# Patient Record
Sex: Female | Born: 1975
Health system: Southern US, Community
[De-identification: ages and names within clinical notes are randomized; demographics above are authoritative.]

## PROBLEM LIST (undated history)

## (undated) DIAGNOSIS — Z720 Tobacco use: Secondary | ICD-10-CM

## (undated) DIAGNOSIS — M549 Dorsalgia, unspecified: Secondary | ICD-10-CM

## (undated) DIAGNOSIS — Q790 Congenital diaphragmatic hernia: Secondary | ICD-10-CM

## (undated) DIAGNOSIS — R001 Bradycardia, unspecified: Secondary | ICD-10-CM

## (undated) DIAGNOSIS — I519 Heart disease, unspecified: Secondary | ICD-10-CM

## (undated) DIAGNOSIS — K449 Diaphragmatic hernia without obstruction or gangrene: Secondary | ICD-10-CM

## (undated) DIAGNOSIS — K760 Fatty (change of) liver, not elsewhere classified: Secondary | ICD-10-CM

## (undated) DIAGNOSIS — F329 Major depressive disorder, single episode, unspecified: Secondary | ICD-10-CM

## (undated) DIAGNOSIS — E669 Obesity, unspecified: Secondary | ICD-10-CM

## (undated) DIAGNOSIS — F32A Depression, unspecified: Secondary | ICD-10-CM

## (undated) DIAGNOSIS — A419 Sepsis, unspecified organism: Secondary | ICD-10-CM

## (undated) DIAGNOSIS — I2510 Atherosclerotic heart disease of native coronary artery without angina pectoris: Secondary | ICD-10-CM

## (undated) DIAGNOSIS — Z9109 Other allergy status, other than to drugs and biological substances: Secondary | ICD-10-CM

## (undated) DIAGNOSIS — F419 Anxiety disorder, unspecified: Secondary | ICD-10-CM

## (undated) DIAGNOSIS — I10 Essential (primary) hypertension: Secondary | ICD-10-CM

## (undated) DIAGNOSIS — I219 Acute myocardial infarction, unspecified: Secondary | ICD-10-CM

## (undated) DIAGNOSIS — K219 Gastro-esophageal reflux disease without esophagitis: Secondary | ICD-10-CM

## (undated) DIAGNOSIS — E785 Hyperlipidemia, unspecified: Secondary | ICD-10-CM

## (undated) HISTORY — DX: Obesity, unspecified: E66.9

## (undated) HISTORY — PX: CARDIAC CATHETERIZATION: SHX172

## (undated) HISTORY — DX: Other allergy status, other than to drugs and biological substances: Z91.09

## (undated) HISTORY — DX: Depression, unspecified: F32.A

## (undated) HISTORY — DX: Diaphragmatic hernia without obstruction or gangrene: K44.9

## (undated) HISTORY — PX: TONSILLECTOMY: SUR1361

## (undated) HISTORY — DX: Major depressive disorder, single episode, unspecified: F32.9

## (undated) HISTORY — DX: Sepsis, unspecified organism: A41.9

## (undated) HISTORY — DX: Fatty (change of) liver, not elsewhere classified: K76.0

## (undated) HISTORY — DX: Dorsalgia, unspecified: M54.9

## (undated) HISTORY — DX: Heart disease, unspecified: I51.9

## (undated) HISTORY — DX: Anxiety disorder, unspecified: F41.9

## (undated) HISTORY — DX: Gastro-esophageal reflux disease without esophagitis: K21.9

---

## 1998-01-27 ENCOUNTER — Emergency Department (HOSPITAL_COMMUNITY): Admission: EM | Admit: 1998-01-27 | Discharge: 1998-01-27 | Payer: Self-pay

## 2001-08-15 ENCOUNTER — Other Ambulatory Visit: Admission: RE | Admit: 2001-08-15 | Discharge: 2001-08-15 | Payer: Self-pay | Admitting: Obstetrics and Gynecology

## 2004-03-18 ENCOUNTER — Other Ambulatory Visit: Admission: RE | Admit: 2004-03-18 | Discharge: 2004-03-18 | Payer: Self-pay | Admitting: Obstetrics and Gynecology

## 2011-03-31 HISTORY — PX: OTHER SURGICAL HISTORY: SHX169

## 2011-07-27 DIAGNOSIS — G8929 Other chronic pain: Secondary | ICD-10-CM | POA: Insufficient documentation

## 2011-07-29 DIAGNOSIS — I252 Old myocardial infarction: Secondary | ICD-10-CM

## 2011-07-29 DIAGNOSIS — I2510 Atherosclerotic heart disease of native coronary artery without angina pectoris: Secondary | ICD-10-CM

## 2011-07-29 HISTORY — DX: Old myocardial infarction: I25.2

## 2011-07-29 HISTORY — DX: Atherosclerotic heart disease of native coronary artery without angina pectoris: I25.10

## 2011-08-09 ENCOUNTER — Emergency Department (HOSPITAL_COMMUNITY): Payer: Self-pay

## 2011-08-09 ENCOUNTER — Encounter (HOSPITAL_COMMUNITY): Payer: Self-pay

## 2011-08-09 ENCOUNTER — Inpatient Hospital Stay (HOSPITAL_COMMUNITY)
Admission: EM | Admit: 2011-08-09 | Discharge: 2011-08-11 | DRG: 246 | Disposition: A | Discharge status: Home / Self Care | Payer: 59 | Source: Ambulatory Visit | Attending: Cardiology | Admitting: Cardiology

## 2011-08-09 ENCOUNTER — Other Ambulatory Visit: Payer: Self-pay

## 2011-08-09 DIAGNOSIS — I219 Acute myocardial infarction, unspecified: Secondary | ICD-10-CM

## 2011-08-09 DIAGNOSIS — Q791 Other congenital malformations of diaphragm: Secondary | ICD-10-CM

## 2011-08-09 DIAGNOSIS — I498 Other specified cardiac arrhythmias: Secondary | ICD-10-CM | POA: Diagnosis present

## 2011-08-09 DIAGNOSIS — N390 Urinary tract infection, site not specified: Secondary | ICD-10-CM | POA: Diagnosis present

## 2011-08-09 DIAGNOSIS — I214 Non-ST elevation (NSTEMI) myocardial infarction: Principal | ICD-10-CM | POA: Diagnosis present

## 2011-08-09 DIAGNOSIS — I2510 Atherosclerotic heart disease of native coronary artery without angina pectoris: Secondary | ICD-10-CM | POA: Diagnosis present

## 2011-08-09 DIAGNOSIS — R0609 Other forms of dyspnea: Secondary | ICD-10-CM

## 2011-08-09 DIAGNOSIS — F17200 Nicotine dependence, unspecified, uncomplicated: Secondary | ICD-10-CM | POA: Diagnosis present

## 2011-08-09 DIAGNOSIS — Z720 Tobacco use: Secondary | ICD-10-CM | POA: Diagnosis present

## 2011-08-09 DIAGNOSIS — R0989 Other specified symptoms and signs involving the circulatory and respiratory systems: Secondary | ICD-10-CM

## 2011-08-09 DIAGNOSIS — Z9089 Acquired absence of other organs: Secondary | ICD-10-CM

## 2011-08-09 DIAGNOSIS — Z955 Presence of coronary angioplasty implant and graft: Secondary | ICD-10-CM

## 2011-08-09 DIAGNOSIS — E785 Hyperlipidemia, unspecified: Secondary | ICD-10-CM | POA: Diagnosis present

## 2011-08-09 HISTORY — DX: Bradycardia, unspecified: R00.1

## 2011-08-09 HISTORY — DX: Congenital diaphragmatic hernia: Q79.0

## 2011-08-09 HISTORY — DX: Tobacco use: Z72.0

## 2011-08-09 HISTORY — DX: Atherosclerotic heart disease of native coronary artery without angina pectoris: I25.10

## 2011-08-09 HISTORY — DX: Hyperlipidemia, unspecified: E78.5

## 2011-08-09 LAB — URINALYSIS, ROUTINE W REFLEX MICROSCOPIC
Bilirubin Urine: NEGATIVE
Glucose, UA: NEGATIVE mg/dL
Hgb urine dipstick: NEGATIVE
Ketones, ur: NEGATIVE mg/dL
Nitrite: POSITIVE — AB
Protein, ur: NEGATIVE mg/dL
Specific Gravity, Urine: 1.014 (ref 1.005–1.030)
Urobilinogen, UA: 0.2 mg/dL (ref 0.0–1.0)
pH: 7.5 (ref 5.0–8.0)

## 2011-08-09 LAB — BASIC METABOLIC PANEL WITH GFR
BUN: 7 mg/dL (ref 6–23)
CO2: 26 meq/L (ref 19–32)
Calcium: 10 mg/dL (ref 8.4–10.5)
Chloride: 100 meq/L (ref 96–112)
Creatinine, Ser: 0.64 mg/dL (ref 0.50–1.10)
GFR calc Af Amer: >90 mL/min
GFR calc non Af Amer: >90 mL/min
Glucose, Bld: 98 mg/dL (ref 70–99)
Potassium: 4.7 meq/L (ref 3.5–5.1)
Sodium: 137 meq/L (ref 135–145)

## 2011-08-09 LAB — COMPREHENSIVE METABOLIC PANEL
ALT: 39 U/L — ABNORMAL HIGH (ref 0–35)
AST: 99 U/L — ABNORMAL HIGH (ref 0–37)
CO2: 23 mEq/L (ref 19–32)
Chloride: 101 mEq/L (ref 96–112)
GFR calc non Af Amer: >90 mL/min (ref >90)
Potassium: 3.6 mEq/L (ref 3.5–5.1)
Sodium: 136 mEq/L (ref 135–145)
Total Bilirubin: 0.5 mg/dL (ref 0.3–1.2)

## 2011-08-09 LAB — DIFFERENTIAL
Basophils Absolute: 0 K/uL (ref 0.0–0.1)
Basophils Relative: 0 % (ref 0–1)
Eosinophils Absolute: 0 K/uL (ref 0.0–0.7)
Eosinophils Relative: 0 % (ref 0–5)
Lymphocytes Relative: 20 % (ref 12–46)
Lymphocytes Relative: 18 % (ref 12–46)
Lymphs Abs: 3.5 K/uL (ref 0.7–4.0)
Lymphs Abs: 3.4 10*3/uL (ref 0.7–4.0)
Monocytes Absolute: 1.2 K/uL — ABNORMAL HIGH (ref 0.1–1.0)
Monocytes Relative: 7 % (ref 3–12)
Neutro Abs: 12.5 K/uL — ABNORMAL HIGH (ref 1.7–7.7)
Neutro Abs: 13.5 10*3/uL — ABNORMAL HIGH (ref 1.7–7.7)
Neutrophils Relative %: 72 % (ref 43–77)
Neutrophils Relative %: 74 % (ref 43–77)

## 2011-08-09 LAB — URINE MICROSCOPIC-ADD ON

## 2011-08-09 LAB — TROPONIN I: Troponin I: 14.4 ng/mL (ref <0.30)

## 2011-08-09 LAB — CARDIAC PANEL(CRET KIN+CKTOT+MB+TROPI)
Relative Index: 9.7 — ABNORMAL HIGH (ref 0.0–2.5)
Total CK: 874 U/L — ABNORMAL HIGH (ref 7–177)

## 2011-08-09 LAB — CBC
HCT: 49.5 % — ABNORMAL HIGH (ref 36.0–46.0)
Hemoglobin: 18.1 g/dL — ABNORMAL HIGH (ref 12.0–15.0)
MCH: 33.8 pg (ref 26.0–34.0)
MCV: 92.3 fL (ref 78.0–100.0)
Platelets: 256 10*3/uL (ref 150–400)
RBC: 5.35 MIL/uL — ABNORMAL HIGH (ref 3.87–5.11)
RBC: 4.91 MIL/uL (ref 3.87–5.11)
WBC: 18.3 10*3/uL — ABNORMAL HIGH (ref 4.0–10.5)

## 2011-08-09 LAB — POCT I-STAT TROPONIN I: Troponin i, poc: 5.83 ng/mL (ref 0.00–0.08)

## 2011-08-09 LAB — POCT PREGNANCY, URINE: Preg Test, Ur: NEGATIVE

## 2011-08-09 LAB — PROTIME-INR: Prothrombin Time: 13.3 seconds (ref 11.6–15.2)

## 2011-08-09 LAB — APTT: aPTT: 75 seconds — ABNORMAL HIGH (ref 24–37)

## 2011-08-09 MED ORDER — SODIUM CHLORIDE 0.9 % IJ SOLN: 3.0000 mL | INTRAMUSCULAR | Status: DC | PRN

## 2011-08-09 MED ORDER — SODIUM CHLORIDE 0.9 % IV SOLN: INTRAVENOUS | Status: DC

## 2011-08-09 MED ORDER — NITROGLYCERIN 2 % TD OINT
TOPICAL_OINTMENT | TRANSDERMAL | Status: AC
  Filled 2011-08-09: qty 1

## 2011-08-09 MED ORDER — HEPARIN (PORCINE) IN NACL 100-0.45 UNIT/ML-% IJ SOLN: 1200.0000 [IU]/h | INTRAMUSCULAR | Status: DC

## 2011-08-09 MED ORDER — ONDANSETRON HCL 4 MG/2ML IJ SOLN
4.0000 mg | Freq: Once | INTRAMUSCULAR | Status: DC
  Filled 2011-08-09: qty 2

## 2011-08-09 MED ORDER — ASPIRIN 300 MG RE SUPP
300.0000 mg | RECTAL | Status: DC
  Filled 2011-08-09: qty 1

## 2011-08-09 MED ORDER — NITROGLYCERIN 0.4 MG SL SUBL: 0.4000 mg | SUBLINGUAL_TABLET | SUBLINGUAL | Status: DC | PRN

## 2011-08-09 MED ORDER — ASPIRIN 81 MG PO CHEW
324.0000 mg | CHEWABLE_TABLET | ORAL | Status: DC
  Filled 2011-08-09: qty 4

## 2011-08-09 MED ORDER — ASPIRIN EC 81 MG PO TBEC
81.0000 mg | DELAYED_RELEASE_TABLET | Freq: Every day | ORAL | Status: DC
  Administered 2011-08-11: 81 mg via ORAL
  Filled 2011-08-09 (×2): qty 1

## 2011-08-09 MED ORDER — SODIUM CHLORIDE 0.9 % IV SOLN: Freq: Once | INTRAVENOUS | Status: DC

## 2011-08-09 MED ORDER — SODIUM CHLORIDE 0.9 % IV SOLN: 250.0000 mL | INTRAVENOUS | Status: DC | PRN

## 2011-08-09 MED ORDER — ONDANSETRON HCL 4 MG/2ML IJ SOLN
4.0000 mg | Freq: Four times a day (QID) | INTRAMUSCULAR | Status: DC | PRN
  Administered 2011-08-10: 4 mg via INTRAVENOUS
  Filled 2011-08-09: qty 2

## 2011-08-09 MED ORDER — ALPRAZOLAM 0.25 MG PO TABS
0.2500 mg | ORAL_TABLET | Freq: Two times a day (BID) | ORAL | Status: DC | PRN
  Administered 2011-08-09 – 2011-08-10 (×3): 0.25 mg via ORAL
  Filled 2011-08-09 (×3): qty 1

## 2011-08-09 MED ORDER — SODIUM CHLORIDE 0.9 % IV SOLN
INTRAVENOUS | Status: DC
  Administered 2011-08-09 – 2011-08-10 (×2): via INTRAVENOUS

## 2011-08-09 MED ORDER — HEPARIN (PORCINE) IN NACL 100-0.45 UNIT/ML-% IJ SOLN
1200.0000 [IU]/h | INTRAMUSCULAR | Status: AC
  Administered 2011-08-09: 1200 [IU]/h via INTRAVENOUS
  Filled 2011-08-09 (×2): qty 250

## 2011-08-09 MED ORDER — ASPIRIN 81 MG PO CHEW
324.0000 mg | CHEWABLE_TABLET | ORAL | Status: AC
  Administered 2011-08-10: 324 mg via ORAL

## 2011-08-09 MED ORDER — HEPARIN BOLUS VIA INFUSION
4000.0000 [IU] | Freq: Once | INTRAVENOUS | Status: AC
  Administered 2011-08-09: 4000 [IU] via INTRAVENOUS

## 2011-08-09 MED ORDER — METOPROLOL TARTRATE 25 MG PO TABS
25.0000 mg | ORAL_TABLET | Freq: Two times a day (BID) | ORAL | Status: DC
  Administered 2011-08-09 – 2011-08-11 (×4): 25 mg via ORAL
  Filled 2011-08-09 (×5): qty 1

## 2011-08-09 MED ORDER — ASPIRIN 81 MG PO CHEW
324.0000 mg | CHEWABLE_TABLET | Freq: Once | ORAL | Status: DC
  Filled 2011-08-09: qty 4

## 2011-08-09 MED ORDER — NITROGLYCERIN 2 % TD OINT
1.0000 [in_us] | TOPICAL_OINTMENT | Freq: Four times a day (QID) | TRANSDERMAL | Status: DC
  Administered 2011-08-09: 19:00:00 via TOPICAL
  Administered 2011-08-09 – 2011-08-11 (×6): 1 [in_us] via TOPICAL
  Filled 2011-08-09: qty 30

## 2011-08-09 MED ORDER — MORPHINE SULFATE 4 MG/ML IJ SOLN
4.0000 mg | Freq: Once | INTRAMUSCULAR | Status: DC
  Filled 2011-08-09: qty 1

## 2011-08-09 MED ORDER — ATORVASTATIN CALCIUM 80 MG PO TABS
80.0000 mg | ORAL_TABLET | Freq: Every day | ORAL | Status: DC
  Administered 2011-08-10: 80 mg via ORAL
  Filled 2011-08-09 (×2): qty 1

## 2011-08-09 MED ORDER — IOHEXOL 350 MG/ML SOLN
100.0000 mL | Freq: Once | INTRAVENOUS | Status: AC | PRN
  Administered 2011-08-09: 100 mL via INTRAVENOUS

## 2011-08-09 MED ORDER — ACETAMINOPHEN 325 MG PO TABS
650.0000 mg | ORAL_TABLET | ORAL | Status: DC | PRN
  Administered 2011-08-09 – 2011-08-10 (×4): 650 mg via ORAL
  Filled 2011-08-09 (×4): qty 2

## 2011-08-09 MED ORDER — SODIUM CHLORIDE 0.9 % IJ SOLN
3.0000 mL | Freq: Two times a day (BID) | INTRAMUSCULAR | Status: DC
  Administered 2011-08-09: 3 mL via INTRAVENOUS

## 2011-08-09 NOTE — Progress Notes (Signed)
ANTICOAGULATION CONSULT NOTE - Initial Consult  Pharmacy Consult for heparin Indication: chest pain/ACS  No Known Allergies  Patient Measurements: Height: 5\' 4"  (162.6 cm) Weight: 230 lb (104.327 kg) IBW/kg (Calculated) : 54.7  Heparin Dosing Weight: 80kg  Vital Signs: Temp: 97 F (36.1 C) (05/12 1258) Temp src: Oral (05/12 1258) BP: 137/81 mmHg (05/12 1845) Pulse Rate: 99  (05/12 1845)  Labs:  Basename 08/09/11 1544 08/09/11 1446  HGB -- 18.1*  HCT -- 49.5*  PLT -- 267  APTT -- --  LABPROT -- --  INR -- --  HEPARINUNFRC -- --  CREATININE -- 0.64  CKTOTAL -- --  CKMB -- --  TROPONINI 14.40* --    Estimated Creatinine Clearance: 112.1 ml/min (by C-G formula based on Cr of 0.64).   Medical History: History reviewed. No pertinent past medical history.  Medications:  Scheduled:    . sodium chloride   Intravenous Once  . nitroGLYCERIN  1 inch Topical Q6H  . DISCONTD: aspirin  324 mg Oral Once  . DISCONTD:  morphine injection  4 mg Intravenous Once  . DISCONTD: ondansetron (ZOFRAN) IV  4 mg Intravenous Once   Infusions:    Assessment: 36 yo female with chest pain / ACS will be started on heparin threarpy.  Baseline CBC ok.  Troponin elevated. Not on prior anticoagulation.  Goal of Therapy:  Heparin level 0.3-0.7 units/ml Monitor platelets by anticoagulation protocol: Yes   Plan:  1) Heparin 4000 units iv bolus x1, then start heparin drip at 1200 units/hr. 2) Check a 6 hour heparin level after drip is started. 3) Daily heparin level and CBC.  Rainey Kahrs, Tsz-Yin 08/09/2011,6:58 PM

## 2011-08-09 NOTE — Progress Notes (Signed)
  Echocardiogram 2D Echocardiogram has been performed.  Mercy Moore 08/09/2011, 6:04 PM

## 2011-08-09 NOTE — ED Provider Notes (Signed)
History     CSN: [id number]  Arrival date & time 08/09/11  1244   None     Chief Complaint  Patient presents with  . Hypertension    (Consider location/radiation/quality/duration/timing/severity/associated sxs/prior treatment) HPI History from patient. 36 year old female who presents with left arm pain. She states that she saw her PCP 2 weeks ago and was told that her blood pressure was elevated at that visit. She is scheduled to return for a repeat visit tomorrow to have her pressure rechecked. She has not been formally diagnosed with high blood pressure. Patient states that she developed an achy pain/heaviness in her left arm last evening, which radiates from the shoulder down to her wrist. Pain is dull and constant. It does not vary with palpation or movement of the arm. She's never had anything like this before. No treatment prior to arrival. Pain does not radiate into her jaw or chest. She denies any dizziness, weakness, headache, visual changes, difficulty walking.  She states that this morning, she had an episode of diaphoresis and shortness of breath which lasted about one to 2 hours. It resolved spontaneously. She did become nauseated and vomited once. Reports a brief episode of posterior neck discomfort with this which resolved as well. She took 325mg  of ASA at 1130 this AM. She is generally healthy but does smoke cigarettes. She does not have diabetes or hyperlipidemia. Does have premature family hx of MI.  Patient has no prior history of DVT/PE. Denies recent trauma, surgery, or prolonged immobilization. Denies hemoptysis, leg swelling, or use of exogenous estrogen.  History reviewed. No pertinent past medical history.  Past Surgical History  Procedure Date  . Tonsillectomy     No family history on file.  History  Substance Use Topics  . Smoking status: Current Everyday Smoker  . Smokeless tobacco: Not on file  . Alcohol Use:      occassional    OB History    Grav  Para Term Preterm Abortions TAB SAB Ect Mult Living                  Review of Systems  Constitutional: Negative for fever and chills.  Gastrointestinal: Positive for nausea and vomiting. Negative for abdominal pain.  Musculoskeletal: Negative for myalgias.    Allergies  Review of patient's allergies indicates no known allergies.  Home Medications  No current outpatient prescriptions on file.  BP 156/110  Pulse 97  Temp(Src) 97 F (36.1 C) (Oral)  Resp 20  SpO2 98%  Physical Exam  Nursing note and vitals reviewed. Constitutional: She is oriented to person, place, and time. She appears well-developed and well-nourished. No distress.       Pt is not currently diaphoretic, but somewhat anxious appearing  HENT:  Head: Normocephalic and atraumatic.  Right Ear: External ear normal.  Left Ear: External ear normal.  Mouth/Throat: Oropharynx is clear and moist. No oropharyngeal exudate.  Eyes: Conjunctivae and EOM are normal. Pupils are equal, round, and reactive to light.  Neck: Normal range of motion. Neck supple.  Cardiovascular: Normal rate, regular rhythm and normal heart sounds.  Exam reveals no gallop and no friction rub.   No murmur heard. Pulmonary/Chest: Effort normal and breath sounds normal. She exhibits no tenderness.  Abdominal: Soft. Bowel sounds are normal. There is no tenderness. There is no rebound and no guarding.  Musculoskeletal: Normal range of motion.       No tenderness to palpation to L arm. FROM and strength. Sensory intact to  lt touch in radian, median, ulnar distributions.   No LE edema, no tenderness to palpation to LEs, no palpable cord or tenderness to dorsiflexion   Neurological: She is alert and oriented to person, place, and time. No cranial nerve deficit. She exhibits normal muscle tone. Coordination normal.  Skin: Skin is warm and dry. She is not diaphoretic.  Psychiatric: She has a normal mood and affect.    ED Course  Procedures  (including critical care time)   Date: 08/09/2011  Rate: 85  Rhythm: normal sinus rhythm  QRS Axis: normal  Intervals: normal  ST/T Wave abnormalities: flipped T wave in III, flat Ts in anterior leads  Conduction Disutrbances:none  Narrative Interpretation:   Old EKG Reviewed: none available    Labs Reviewed  CBC - Abnormal; Notable for the following:    WBC 17.3 (*)    RBC 5.35 (*)    Hemoglobin 18.1 (*)    HCT 49.5 (*)    MCHC 36.6 (*)    All other components within normal limits  DIFFERENTIAL - Abnormal; Notable for the following:    Neutro Abs 12.5 (*)    Monocytes Absolute 1.2 (*)    All other components within normal limits  POCT I-STAT TROPONIN I - Abnormal; Notable for the following:    Troponin i, poc 5.83 (*)    All other components within normal limits  POCT PREGNANCY, URINE  BASIC METABOLIC PANEL  URINALYSIS, ROUTINE W REFLEX MICROSCOPIC   Dg Chest 2 View  08/09/2011  *RADIOLOGY REPORT*  Clinical Data: Left arm pain  CHEST - 2 VIEW  Comparison: None.  Findings: Normal cardiac silhouette.  There is collapse of the right middle lobe which obscures the right heart border.  Left lung base upper lobes appear clear.  There is a chronic wedge deformity of the L1 vertebral body.  IMPRESSION:  1.  Collapse of the right middle lobe.  This could represent mucous plugging or potential pneumonia.  Cannot exclude obstructing lesion centrally. 2.  Chronic compression fracture of the lumbar spine.  Original Report Authenticated By: Genevive Bi, M.D.   I personally reviewed the CXR.  No diagnosis found.    MDM  2:35 PM Patient seen and evaluated. EKG reviewed. She reports discomfort in her left arm, diaphoresis, shortness of breath, nausea without chest pain which started this morning. Vitals stable. Symptoms atypical for ACS, but will evaluate with lab work. She is PERC negative. We will establish an IV, give morphine for pain, and perform lab workup.  3:35 PM Patient's  troponin significantly elevated at 5.83. We will discuss with cardiology and plan to admit the patient.  3:51 PM Dr. Freida Busman has discussed with Golden who will see.  3:59 PM CXR with ?collapse of R middle lobe. Dr. Freida Busman d/w Dr. Amil Amen of radiology who recommends CT angio chest for further evaluation.  4:05 PM Pt signed out to Dr. Preston Fleeting, oncoming ED MD.    Grant Fontana, PA 08/09/11 1606  [place], [place] 08/09/11 1610

## 2011-08-09 NOTE — ED Provider Notes (Signed)
Medical screening examination/treatment/procedure(s) were conducted as a shared visit with non-physician practitioner(s) and myself.  I personally evaluated the patient during the encounter  Spoke with patient and blood work and x-ray results noted. Troponin elevated she was given aspirin. chest x-ray results noted and patient to have CT of her chest. Cardiology has been counseled  Toy Baker, MD 08/09/11 1559

## 2011-08-09 NOTE — ED Notes (Signed)
Primary RN and Dr. Preston Fleeting notified of pt critical troponin

## 2011-08-09 NOTE — H&P (Signed)
CARDIOLOGY ADMISSION NOTE  Patient ID: Rhonda Colon MRN: [id number] DOB/AGE: Oct 06, 1975 36 y.o.  Admit date: 08/09/2011 Primary Physician   No primary provider on file. Primary Cardiologist   None Chief Complaint    Arm pain  HPI: The patient has no prior cardiac history. A couple of days ago she had some mild arm soreness felt like she couldn't get a full breath. However she was able to walk 4 flights of stairs a week ago without significant limitations. This morning around 3 AM she had some mild dull discomfort in her left arm and shoulder blade. She had some trouble finding a comfortable position. She was able to go back to sleep around 4 AM but woke up with the same discomfort that has been fairly persistent since. She did have some nausea and vomiting. Had some mild dyspnea but overall feels relatively well. She's not had any chest pressure. She had some discomfort behind her left shoulder blade. She never had this discomfort before. In the emergency room her EKG demonstrated no acute changes. However, troponins are markedly elevated as below. She did have a markedly abnormal chest x-ray at per suggestive of a collapsed right lung. However, this is subsequently found to be herniated fat through a fenestration in her diaphragm. She's not describing PND or orthopnea. She's not describing palpitations, presyncope or syncope. She's never had any cardiac workup for history.  She's currently having only mild discomfort.  PMH. No pertinent past medical history except tobacco abuse.   Past Surgical History  Procedure Date  . Tonsillectomy     No Known Allergies No current facility-administered medications on file prior to encounter.   No current outpatient prescriptions on file prior to encounter.   History   Social History  . Marital Status: Single    Spouse Name: N/A    Number of Children: 0  . Years of Education: N/A   Occupational History  . Realtor    Social History Main  Topics  . Smoking status: Current Everyday Smoker -- 1.0 packs/day for 18 years    Types: Cigarettes  . Smokeless tobacco: Not on file  . Alcohol Use: Not on file     occassional  . Drug Use: No  . Sexually Active: Yes    Birth Control/ Protection: None   Other Topics Concern  . Not on file   Social History Narrative   Lives alone.  Occasional EtOH.      Family History  Problem Relation Age of Onset  . Coronary artery disease Mother 23    MI, stent  . Coronary artery disease Maternal Aunt 61    MI (Mother's twin sister)  . Coronary artery disease Maternal Uncle 46    Fatal MI     ROS:  As stated in the HPI and negative for all other systems.  Physical Exam: Blood pressure 132/79, pulse 101, temperature 97 F (36.1 C), temperature source Oral, resp. rate 23, last menstrual period 07/19/2011, SpO2 97.00%.  GENERAL:  Well appearing HEENT:  Pupils equal round and reactive, fundi not visualized, oral mucosa unremarkable NECK:  No jugular venous distention, waveform within normal limits, carotid upstroke brisk and symmetric, no bruits, no thyromegaly LYMPHATICS:  No cervical, inguinal adenopathy LUNGS:  Clear to auscultation bilaterally BACK:  No CVA tenderness CHEST:  Unremarkable HEART:  PMI not displaced or sustained,S1 and S2 within normal limits, no S3, no S4, no clicks, no rubs, no murmurs ABD:  Flat, positive bowel sounds normal in  frequency in pitch, no bruits, no rebound, no guarding, no midline pulsatile mass, no hepatomegaly, no splenomegaly EXT:  2 plus pulses throughout, no edema, no cyanosis no clubbing SKIN:  No rashes no nodules NEURO:  Cranial nerves II through XII grossly intact, motor grossly intact throughout PSYCH:  Cognitively intact, oriented to person place and time  Labs: Lab Results  Component Value Date   BUN 7 08/09/2011   Lab Results  Component Value Date   CREATININE 0.64 08/09/2011   Lab Results  Component Value Date   NA 137 08/09/2011    K 4.7 08/09/2011   CL 100 08/09/2011   CO2 26 08/09/2011   No results found for this basename: CKTOTAL,  CKMB,  CKMBINDEX,  TROPONINI   Lab Results  Component Value Date   WBC 17.3* 08/09/2011   HGB 18.1* 08/09/2011   HCT 49.5* 08/09/2011   MCV 92.5 08/09/2011   PLT 267 08/09/2011    Radiology:    CXR   1. Collapse of the right middle lobe. This could represent mucous plugging or potential pneumonia. Cannot exclude obstructing lesion centrally.   2. Chronic compression fracture of the lumbar spine.   Chest CT:  1. Technically adequate study for pulmonary embolus. No pulmonary Embolism.  2. 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  EKG:  Rate 85, rightward axis, poor anterior R wave progression.  No acute ST T wave changes.  08/09/2011  ASSESSMENT AND PLAN:    1)  NQWMI:  Echo in the ER without pericardial effusion.  Her 38 without any significant valvular abnormalities. She has no acute ST segment changes on her EKG in currently is having only mild discomfort. She will be admitted and treated with heparin, nitrates and aspirin. She has continued chest discomfort she will be taken urgently to the cardiac cath lab.  2) Tobacco abuse:  Consult will be obtained.   3) Risk reduction:  I will start her empirically on statins.  4) Morgagni hernia:  Incidental finding. This can be followed as an outpatient.  SignedRollene Rotunda 08/09/2011, 5:44 PM

## 2011-08-09 NOTE — ED Notes (Signed)
Pt. Reports on April 29th was reported the her BP was 151/102,  Pt. Has an appointment tomorrow at [facility] for a recheck of her BP ,.  Two night ago was having difficulty sleeping, and last night noticed that her lt. Arm was uncomfortable and her posterior neck is uncomfortable.  She also reports that she became sob and cool and clammy this am and her lt. Arm felt sore. Vomited X1 this and is nauseated. Denies any chest pain,  Presently, pt. Is in NAD< skin is w/d/p, resp. E/u

## 2011-08-09 NOTE — ED Notes (Addendum)
Dr.Allen and Miguel Rota informed of trop. Istat critical value. ED-Lab.

## 2011-08-10 ENCOUNTER — Other Ambulatory Visit: Payer: Self-pay

## 2011-08-10 ENCOUNTER — Encounter (HOSPITAL_COMMUNITY)
Admission: EM | Disposition: A | Discharge status: Home / Self Care | Payer: Self-pay | Source: Ambulatory Visit | Attending: Cardiology

## 2011-08-10 DIAGNOSIS — I219 Acute myocardial infarction, unspecified: Secondary | ICD-10-CM | POA: Diagnosis present

## 2011-08-10 DIAGNOSIS — I214 Non-ST elevation (NSTEMI) myocardial infarction: Secondary | ICD-10-CM

## 2011-08-10 DIAGNOSIS — I2510 Atherosclerotic heart disease of native coronary artery without angina pectoris: Secondary | ICD-10-CM

## 2011-08-10 DIAGNOSIS — Z720 Tobacco use: Secondary | ICD-10-CM | POA: Diagnosis present

## 2011-08-10 DIAGNOSIS — Z955 Presence of coronary angioplasty implant and graft: Secondary | ICD-10-CM

## 2011-08-10 HISTORY — DX: Presence of coronary angioplasty implant and graft: Z95.5

## 2011-08-10 HISTORY — PX: LEFT HEART CATHETERIZATION WITH CORONARY ANGIOGRAM: SHX5451

## 2011-08-10 HISTORY — PX: CARDIAC CATHETERIZATION: SHX172

## 2011-08-10 LAB — BASIC METABOLIC PANEL
CO2: 22 mEq/L (ref 19–32)
Calcium: 9 mg/dL (ref 8.4–10.5)
Chloride: 101 mEq/L (ref 96–112)
Creatinine, Ser: 0.61 mg/dL (ref 0.50–1.10)
Glucose, Bld: 102 mg/dL — ABNORMAL HIGH (ref 70–99)

## 2011-08-10 LAB — CBC
Hemoglobin: 16.1 g/dL — ABNORMAL HIGH (ref 12.0–15.0)
MCH: 32.9 pg (ref 26.0–34.0)
MCV: 93.3 fL (ref 78.0–100.0)
RBC: 4.89 MIL/uL (ref 3.87–5.11)

## 2011-08-10 LAB — CARDIAC PANEL(CRET KIN+CKTOT+MB+TROPI)
Relative Index: 8.1 — ABNORMAL HIGH (ref 0.0–2.5)
Relative Index: 6.7 — ABNORMAL HIGH (ref 0.0–2.5)
Total CK: 785 U/L — ABNORMAL HIGH (ref 7–177)

## 2011-08-10 LAB — LIPID PANEL
Cholesterol: 227 mg/dL — ABNORMAL HIGH (ref 0–200)
HDL: 43 mg/dL (ref >39)
Total CHOL/HDL Ratio: 5.3 RATIO

## 2011-08-10 LAB — HEMOGLOBIN A1C
Hgb A1c MFr Bld: 5.3 % (ref <5.7)
Mean Plasma Glucose: 105 mg/dL (ref <117)

## 2011-08-10 LAB — POCT ACTIVATED CLOTTING TIME: Activated Clotting Time: 474 seconds

## 2011-08-10 LAB — TSH: TSH: 1.276 u[IU]/mL (ref 0.350–4.500)

## 2011-08-10 LAB — PROTIME-INR: INR: 0.96 (ref 0.00–1.49)

## 2011-08-10 LAB — HEPARIN LEVEL (UNFRACTIONATED): Heparin Unfractionated: <0.1 IU/mL — ABNORMAL LOW (ref 0.30–0.70)

## 2011-08-10 SURGERY — LEFT HEART CATHETERIZATION WITH CORONARY ANGIOGRAM
Anesthesia: LOCAL

## 2011-08-10 MED ORDER — SODIUM CHLORIDE 0.9 % IV SOLN
0.2500 mg/kg/h | INTRAVENOUS | Status: DC
  Filled 2011-08-10: qty 250

## 2011-08-10 MED ORDER — MIDAZOLAM HCL 2 MG/2ML IJ SOLN
INTRAMUSCULAR | Status: AC
  Filled 2011-08-10: qty 2

## 2011-08-10 MED ORDER — BIVALIRUDIN 250 MG IV SOLR
INTRAVENOUS | Status: AC
  Filled 2011-08-10: qty 250

## 2011-08-10 MED ORDER — LIDOCAINE HCL (PF) 1 % IJ SOLN
INTRAMUSCULAR | Status: AC
  Filled 2011-08-10: qty 30

## 2011-08-10 MED ORDER — HEPARIN (PORCINE) IN NACL 100-0.45 UNIT/ML-% IJ SOLN
1600.0000 [IU]/h | INTRAMUSCULAR | Status: DC
  Administered 2011-08-10: 1600 [IU]/h via INTRAVENOUS
  Filled 2011-08-10 (×2): qty 250

## 2011-08-10 MED ORDER — DIAZEPAM 5 MG PO TABS
5.0000 mg | ORAL_TABLET | Freq: Once | ORAL | Status: AC
  Administered 2011-08-10: 5 mg via ORAL

## 2011-08-10 MED ORDER — FENTANYL CITRATE 0.05 MG/ML IJ SOLN
INTRAMUSCULAR | Status: AC
  Filled 2011-08-10: qty 2

## 2011-08-10 MED ORDER — ALUM & MAG HYDROXIDE-SIMETH 200-200-20 MG/5ML PO SUSP
15.0000 mL | ORAL | Status: DC | PRN
  Administered 2011-08-10: 30 mL via ORAL
  Filled 2011-08-10: qty 30

## 2011-08-10 MED ORDER — TICAGRELOR 90 MG PO TABS
90.0000 mg | ORAL_TABLET | Freq: Two times a day (BID) | ORAL | Status: DC
  Administered 2011-08-11: 90 mg via ORAL
  Filled 2011-08-10 (×3): qty 1

## 2011-08-10 MED ORDER — TICAGRELOR 90 MG PO TABS
ORAL_TABLET | ORAL | Status: AC
  Filled 2011-08-10: qty 2

## 2011-08-10 MED ORDER — HEPARIN BOLUS VIA INFUSION
3000.0000 [IU] | Freq: Once | INTRAVENOUS | Status: AC
  Administered 2011-08-10: 3000 [IU] via INTRAVENOUS
  Filled 2011-08-10: qty 3000

## 2011-08-10 MED ORDER — HEPARIN SODIUM (PORCINE) 1000 UNIT/ML IJ SOLN
INTRAMUSCULAR | Status: AC
  Filled 2011-08-10: qty 1

## 2011-08-10 MED ORDER — HEPARIN (PORCINE) IN NACL 2-0.9 UNIT/ML-% IJ SOLN
INTRAMUSCULAR | Status: AC
  Filled 2011-08-10: qty 2000

## 2011-08-10 MED ORDER — NITROGLYCERIN 0.2 MG/ML ON CALL CATH LAB
INTRAVENOUS | Status: AC
  Filled 2011-08-10: qty 1

## 2011-08-10 MED ORDER — SODIUM CHLORIDE 0.9 % IV SOLN
INTRAVENOUS | Status: AC
  Administered 2011-08-10: 12:00:00 via INTRAVENOUS

## 2011-08-10 NOTE — Progress Notes (Signed)
ANTICOAGULATION CONSULT NOTE - Initial Consult  Pharmacy Consult for heparin Indication: chest pain/ACS  No Known Allergies  Patient Measurements: Height: 5\' 4"  (162.6 cm) Weight: 253 lb 4.9 oz (114.9 kg) IBW/kg (Calculated) : 54.7  Heparin Dosing Weight: 80kg  Vital Signs: Temp: 98.1 F (36.7 C) (05/12 2320) Temp src: Oral (05/12 2320) BP: 110/77 mmHg (05/13 0000) Pulse Rate: 86  (05/13 0000)  Labs:  Basename 08/10/11 0133 08/09/11 2120 08/09/11 2119 08/09/11 1544 08/09/11 1446  HGB 16.1* -- 16.4* -- --  HCT 45.6 -- 45.3 -- 49.5*  PLT 255 -- 256 -- 267  APTT -- 75* -- -- --  LABPROT 13.0 -- 13.3 -- --  INR 0.96 -- 0.99 -- --  HEPARINUNFRC <0.10* -- -- -- --  CREATININE -- -- 0.59 -- 0.64  CKTOTAL -- -- 874* -- --  CKMB -- -- 84.5* -- --  TROPONINI -- -- 21.79* 14.40* --    Estimated Creatinine Clearance: 118.6 ml/min (by C-G formula based on Cr of 0.59).   Medical History: History reviewed. No pertinent past medical history.  Medications:  Scheduled:     . sodium chloride   Intravenous Once  . aspirin  324 mg Oral NOW   Or  . aspirin  300 mg Rectal NOW  . aspirin  324 mg Oral Pre-Cath  . aspirin EC  81 mg Oral Daily  . atorvastatin  80 mg Oral q1800  . heparin  1,200 Units/hr Intravenous To Minor  . heparin  4,000 Units Intravenous Once  . metoprolol tartrate  25 mg Oral BID  . nitroGLYCERIN  1 inch Topical Q6H  . sodium chloride  3 mL Intravenous Q12H  . DISCONTD: aspirin  324 mg Oral Once  . DISCONTD:  morphine injection  4 mg Intravenous Once  . DISCONTD: ondansetron (ZOFRAN) IV  4 mg Intravenous Once   Infusions:     . sodium chloride 75 mL/hr at 08/09/11 2017  . heparin    . DISCONTD: sodium chloride    . DISCONTD: heparin      Assessment: HL is undetectable   Goal of Therapy:  Heparin level 0.3-0.7 units/ml Monitor platelets by anticoagulation protocol: Yes   Plan:  rebolus Heparin with 3000 units then increase rate to 1600  units/hr. Recheck in 6 hours.  Janice Coffin 08/10/2011,3:04 AM

## 2011-08-10 NOTE — CV Procedure (Signed)
    Cardiac Catheterization Operative Report  Rhonda Colon [id number] 5/13/201311:16 AM No primary provider on file.  Procedure Performed:  1. Left Heart Catheterization 2. Selective Coronary Angiography 3. Left ventricular angiogram 4. PTCA/DES x 1 mid RCA  Operator: Verne Carrow, MD  Arterial access site:  Right radial artery.   Indication:  NSTEMI                                    Procedure Details: The risks, benefits, complications, treatment options, and expected outcomes were discussed with the patient. The patient and/or family concurred with the proposed plan, giving informed consent. The patient was brought to the cath lab after IV hydration was begun and oral premedication was given. The patient was further sedated with Versed and Fentanyl. The right wrist was assessed with an Allens test which was positive. The right wrist was prepped and draped in a sterile fashion. 1% lidocaine was used for local anesthesia. Using the modified Seldinger access technique, a 5 French sheath was placed in the right radial artery. 1.25 mg Nicardipine was given through the sheath. 5000 units IV heparin was given. Standard diagnostic catheters were used to perform selective coronary angiography. A pigtail catheter was used to perform a left ventricular angiogram. The patient was found to have a severe stenosis in the mid RCA. The sheath was upsized to a 6 [place] system. A bolus of Angiomax was given and a drip was started. She was given 180 mg Brilinta po x 1. I then engaged the RCA with a JR4 guiding catheter. When the ACT was greater than 200, I passed a Cougar IC wire down the RCA. A 2.5 x 12 mm balloon was used to pre-dilate the stenosis. I then deployed a 3.0 x 16 mm Promus Element DES in the mid RCA. This was post-dilated with a 3.25 x 12 mm Poole balloon x 1 at high atmospheres. There was an excellent result. The catheter and wire were removed. The stenosis was taken from 99% down to 0%.     The sheath was removed from the right radial artery and a Terumo hemostasis band was applied at the arteriotomy site on the right wrist.  There were no immediate complications. The patient was taken to the recovery area in stable condition.   Hemodynamic Findings: Central aortic pressure: 107/77 Left ventricular pressure: 107/9/16  Angiographic Findings:  Left main:  No obstructive disease.   Left Anterior Descending Artery: Large caliber vessel that courses to the apex. Moderate sized diagonal branch. No obstructive disease noted.   Circumflex Artery: Moderate sized artery with a moderate sized OM branch. No obstructive disease noted.   Right Coronary Artery: Large, dominant vessel with 99% mid vessel stenosis. The remainder of the vessel is free of any significant disease.   Left Ventricular Angiogram: LVEF 50% with inferior wall hypokinesis.   Impression: 1.  Single vessel CAD with severe stenosis mid RCA 2.  Mild LV systolic dysfunction 3.  Successful PTCA/DES x 1 mid RCA  Recommendations: She will need dual anti-platelet therapy with ASA and Brilinta for at least one year. Will continue statin and beta blocker.        Complications:  None. The patient tolerated the procedure well.

## 2011-08-10 NOTE — Progress Notes (Signed)
TR BAND REMOVAL  LOCATION:  right radial  DEFLATED PER PROTOCOL:  yes  TIME BAND OFF / DRESSING APPLIED:   1430   SITE UPON ARRIVAL:   Level 0  SITE AFTER BAND REMOVAL:  Level 0  REVERSE ALLEN'S TEST:    positive  CIRCULATION SENSATION AND MOVEMENT:  Within Normal Limits  yes  COMMENTS:     

## 2011-08-10 NOTE — H&P (View-Only) (Signed)
[facility] Resident Note  Subjective:  No chest pain overnight.  States she has been up and moving to the bathroom with no return of the chest pain.  Scheduled for cardiac cath this morning at 9:30.   Objective:  Vital Signs in the last 24 hours: Filed Vitals:   08/10/11 0451 08/10/11 0515 08/10/11 0600 08/10/11 0700  BP:   106/46 92/55  Pulse: 98 102 103 85  Temp:    98 F (36.7 C)  TempSrc:    Oral  Resp:      Height:      Weight:      SpO2: 95% 98% 95% 98%   Intake/Output from previous day: 05/12 0701 - 05/13 0700 In: 1358.8 [P.O.:420; I.V.:938.8] Out: 1150 [Urine:1150]  24-hour weight change: Weight change:   Weight trends: Filed Weights   08/09/11 1845 08/09/11 1947 08/09/11 2117  Weight: 253 lb 4.9 oz (114.9 kg) 253 lb 4.9 oz (114.9 kg) 253 lb 4.9 oz (114.9 kg)   Physical Exam: Vitals reviewed. General: resting in bed, NAD HEENT: PERRL, EOMI, no scleral icterus Cardiac: RRR, no rubs, murmurs or gallops Pulm: clear to auscultation bilaterally, no wheezes, rales, or rhonchi Abd: soft, nontender, nondistended, BS present Ext: warm and well perfused, no pedal edema Neuro: alert and oriented X3, cranial nerves II-XII grossly intact, strength and sensation to light touch equal in bilateral upper and lower extremities   Lab Results:  Basename 08/10/11 0133 08/09/11 2119  WBC 15.6* 18.3*  HGB 16.1* 16.4*  PLT 255 256    Basename 08/10/11 0133 08/09/11 2119  NA 135 136  K 3.9 3.6  CL 101 101  CO2 22 23  GLUCOSE 102* 129*  BUN 6 6  CREATININE 0.61 0.59    Basename 08/10/11 0133 08/09/11 2119  TROPONINI 14.11* 21.79*    Basename 08/09/11 2119 08/09/11 1544  PROBNP 1733.0* 1516.0*   Cardiac Studies: None yet.  Tele: Reviewed, no events  Scheduled Meds:   . sodium chloride   Intravenous Once  . aspirin  324 mg Oral NOW   Or  . aspirin  300 mg Rectal NOW  . aspirin  324 mg Oral Pre-Cath  . aspirin EC  81 mg Oral Daily  .  atorvastatin  80 mg Oral q1800  . heparin  1,200 Units/hr Intravenous To Minor  . heparin  3,000 Units Intravenous Once  . heparin  4,000 Units Intravenous Once  . metoprolol tartrate  25 mg Oral BID  . nitroGLYCERIN  1 inch Topical Q6H  . sodium chloride  3 mL Intravenous Q12H  . DISCONTD: aspirin  324 mg Oral Once  . DISCONTD:  morphine injection  4 mg Intravenous Once  . DISCONTD: ondansetron (ZOFRAN) IV  4 mg Intravenous Once   Continuous Infusions:   . sodium chloride 75 mL/hr at 08/09/11 2017  . heparin 1,600 Units/hr (08/10/11 0448)  . DISCONTD: sodium chloride    . DISCONTD: heparin     PRN Meds:.sodium chloride, acetaminophen, ALPRAZolam, iohexol, nitroGLYCERIN, ondansetron (ZOFRAN) IV, sodium chloride  Imaging: Dg Chest 2 View  08/09/2011  *RADIOLOGY REPORT*  Clinical Data: Left arm pain  CHEST - 2 VIEW  Comparison: None.  Findings: Normal cardiac silhouette.  There is collapse of the right middle lobe which obscures the right heart border.  Left lung base upper lobes appear clear.  There is a chronic wedge deformity of the L1 vertebral body.  IMPRESSION:  1.  Collapse of the right middle lobe.  This could represent mucous plugging or potential pneumonia.  Cannot exclude obstructing lesion centrally. 2.  Chronic compression fracture of the lumbar spine.  Original Report Authenticated By: Genevive Bi, M.D.   Ct Angio Chest W/cm &/or Wo Cm  08/09/2011  *RADIOLOGY REPORT*  Clinical Data: Chest pain.  Abnormal chest x-ray.  CT ANGIOGRAPHY CHEST  Technique:  Multidetector CT imaging of the chest using the standard protocol during bolus administration of intravenous contrast. Multiplanar reconstructed images including MIPs were obtained and reviewed to evaluate the vascular anatomy.  Contrast: OMNIPAQUE IOHEXOL 350 MG/ML SOLN  Comparison: 08/09/2011.  Findings: Technically adequate study without pulmonary embolus. Heart appears within normal limits.  Right middle lobe  atelectasis is present.  There is a large amount of fat in the right inferior chest producing atelectasis of the right middle lobe.  On the sagittal reconstructed images, there is an anterior diaphragmatic defect with herniation of the omentum into the right inferior chest.  This is compatible with a Morgagni hernia.  Fat there is no soft tissue mass lesion to suggest all liposarcoma.  Dependent atelectasis is present in the lungs.  There is no axillary adenopathy.  No effusion.  Small hiatal hernia and patulous gastroesophageal junction.  Bones are within normal limits. Spinal compression fracture is off of the field of view on examination.  IMPRESSION: 1.  Technically adequate study for pulmonary embolus.  No pulmonary embolism. 2.  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  Original Report Authenticated By: Andreas Newport, M.D.   Assessment/Plan:  1. Non Q Wave MI: Presented with history of a couple days of mild arm soreness.  She also had an event where she woke up with some pain in the night as well.  Troponin in th ED was elevated but she had no ST segment changes on her EKG.  Echo in the ER without pericardial effusion. She was admitted and treated with heparin, nitrates and aspirin. Currently she states that she is chest pain free but with the significant elevation in Troponin overnight the plan will be for a cardiac cath this morning by Dr. Clifton James.  She has a significant family history, is a smoker, and has some hyperlipidemia.  Will also check her A1C today to assess for diabetes.  2. Tobacco abuse: Consult will be obtained.   3. Hyperlipidemia: LDL is 148.  Will continue statin at this time.   4. Morgagni hernia: Incidental finding. This can be followed as an outpatient.   Length of Stay: 1 days  Patient history and plan of care reviewed with attending, Dr. Tamala Bari, M.D. 08/10/2011,  7:45 AM   I have personally seen and examined this patient. I agree with the assessment and plan as outlined above. I have discussed cardiac cath in detail and she agrees to proceed.   Howard Patton 8:36 AM 08/10/2011

## 2011-08-10 NOTE — Progress Notes (Signed)
CRITICAL VALUE ALERT  Critical value received:  Troponin 21.79  Date of notification: 08/09/2011  Time of notification:  2119  Critical value read back:yes  Nurse who received alert:  Jeb Levering  MD notified (1st page):  Dr. Maryelizabeth Kaufmann  Time of first page:  2119  MD notified (2nd page):  Time of second page:  Responding MD:  Dr. Maryelizabeth Kaufmann  Time MD responded:  2119

## 2011-08-10 NOTE — Interval H&P Note (Signed)
History and Physical Interval Note:  08/10/2011 9:54 AM  Rhonda Colon  has presented today for surgery, with the diagnosis of cp  The various methods of treatment have been discussed with the patient and family. After consideration of risks, benefits and other options for treatment, the patient has consented to  Procedure(s) (LRB): LEFT HEART CATHETERIZATION WITH CORONARY ANGIOGRAM (N/A) as a surgical intervention .  The patients' history has been reviewed, patient examined, no change in status, stable for surgery.  I have reviewed the patients' chart and labs.  Questions were answered to the patient's satisfaction.     Athina Fahey

## 2011-08-10 NOTE — Progress Notes (Signed)
 [facility] Resident Note  Subjective:  No chest pain overnight.  States she has been up and moving to the bathroom with no return of the chest pain.  Scheduled for cardiac cath this morning at 9:30.   Objective:  Vital Signs in the last 24 hours: Filed Vitals:   08/10/11 0451 08/10/11 0515 08/10/11 0600 08/10/11 0700  BP:   106/46 92/55  Pulse: 98 102 103 85  Temp:    98 F (36.7 C)  TempSrc:    Oral  Resp:      Height:      Weight:      SpO2: 95% 98% 95% 98%   Intake/Output from previous day: 05/12 0701 - 05/13 0700 In: 1358.8 [P.O.:420; I.V.:938.8] Out: 1150 [Urine:1150]  24-hour weight change: Weight change:   Weight trends: Filed Weights   08/09/11 1845 08/09/11 1947 08/09/11 2117  Weight: 253 lb 4.9 oz (114.9 kg) 253 lb 4.9 oz (114.9 kg) 253 lb 4.9 oz (114.9 kg)   Physical Exam: Vitals reviewed. General: resting in bed, NAD HEENT: PERRL, EOMI, no scleral icterus Cardiac: RRR, no rubs, murmurs or gallops Pulm: clear to auscultation bilaterally, no wheezes, rales, or rhonchi Abd: soft, nontender, nondistended, BS present Ext: warm and well perfused, no pedal edema Neuro: alert and oriented X3, cranial nerves II-XII grossly intact, strength and sensation to light touch equal in bilateral upper and lower extremities   Lab Results:  Basename 08/10/11 0133 08/09/11 2119  WBC 15.6* 18.3*  HGB 16.1* 16.4*  PLT 255 256    Basename 08/10/11 0133 08/09/11 2119  NA 135 136  K 3.9 3.6  CL 101 101  CO2 22 23  GLUCOSE 102* 129*  BUN 6 6  CREATININE 0.61 0.59    Basename 08/10/11 0133 08/09/11 2119  TROPONINI 14.11* 21.79*    Basename 08/09/11 2119 08/09/11 1544  PROBNP 1733.0* 1516.0*   Cardiac Studies: None yet.  Tele: Reviewed, no events  Scheduled Meds:   . sodium chloride   Intravenous Once  . aspirin  324 mg Oral NOW   Or  . aspirin  300 mg Rectal NOW  . aspirin  324 mg Oral Pre-Cath  . aspirin EC  81 mg Oral Daily  .  atorvastatin  80 mg Oral q1800  . heparin  1,200 Units/hr Intravenous To Minor  . heparin  3,000 Units Intravenous Once  . heparin  4,000 Units Intravenous Once  . metoprolol tartrate  25 mg Oral BID  . nitroGLYCERIN  1 inch Topical Q6H  . sodium chloride  3 mL Intravenous Q12H  . DISCONTD: aspirin  324 mg Oral Once  . DISCONTD:  morphine injection  4 mg Intravenous Once  . DISCONTD: ondansetron (ZOFRAN) IV  4 mg Intravenous Once   Continuous Infusions:   . sodium chloride 75 mL/hr at 08/09/11 2017  . heparin 1,600 Units/hr (08/10/11 0448)  . DISCONTD: sodium chloride    . DISCONTD: heparin     PRN Meds:.sodium chloride, acetaminophen, ALPRAZolam, iohexol, nitroGLYCERIN, ondansetron (ZOFRAN) IV, sodium chloride  Imaging: Dg Chest 2 View  08/09/2011  *RADIOLOGY REPORT*  Clinical Data: Left arm pain  CHEST - 2 VIEW  Comparison: None.  Findings: Normal cardiac silhouette.  There is collapse of the right middle lobe which obscures the right heart border.  Left lung base upper lobes appear clear.  There is a chronic wedge deformity of the L1 vertebral body.  IMPRESSION:  1.  Collapse of the right middle lobe.    This could represent mucous plugging or potential pneumonia.  Cannot exclude obstructing lesion centrally. 2.  Chronic compression fracture of the lumbar spine.  Original Report Authenticated By: STEWART EDMUNDS, M.D.   Ct Angio Chest W/cm &/or Wo Cm  08/09/2011  *RADIOLOGY REPORT*  Clinical Data: Chest pain.  Abnormal chest x-ray.  CT ANGIOGRAPHY CHEST  Technique:  Multidetector CT imaging of the chest using the standard protocol during bolus administration of intravenous contrast. Multiplanar reconstructed images including MIPs were obtained and reviewed to evaluate the vascular anatomy.  Contrast: 100mL OMNIPAQUE IOHEXOL 350 MG/ML SOLN  Comparison: 08/09/2011.  Findings: Technically adequate study without pulmonary embolus. Heart appears within normal limits.  Right middle lobe  atelectasis is present.  There is a large amount of fat in the right inferior chest producing atelectasis of the right middle lobe.  On the sagittal reconstructed images, there is an anterior diaphragmatic defect with herniation of the omentum into the right inferior chest.  This is compatible with a Morgagni hernia.  Fat there is no soft tissue mass lesion to suggest all liposarcoma.  Dependent atelectasis is present in the lungs.  There is no axillary adenopathy.  No effusion.  Small hiatal hernia and patulous gastroesophageal junction.  Bones are within normal limits. Spinal compression fracture is off of the field of view on examination.  IMPRESSION: 1.  Technically adequate study for pulmonary embolus.  No pulmonary embolism. 2.  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  Original Report Authenticated By: GEOFFREY LAMKE, M.D.   Assessment/Plan:  1. Non Q Wave MI: Presented with history of a couple days of mild arm soreness.  She also had an event where she woke up with some pain in the night as well.  Troponin in th ED was elevated but she had no ST segment changes on her EKG.  Echo in the ER without pericardial effusion. She was admitted and treated with heparin, nitrates and aspirin. Currently she states that she is chest pain free but with the significant elevation in Troponin overnight the plan will be for a cardiac cath this morning by Dr. Jaxtyn Linville.  She has a significant family history, is a smoker, and has some hyperlipidemia.  Will also check her A1C today to assess for diabetes.  2. Tobacco abuse: Consult will be obtained.   3. Hyperlipidemia: LDL is 148.  Will continue statin at this time.   4. Morgagni hernia: Incidental finding. This can be followed as an outpatient.   Length of Stay: 1 days  Patient history and plan of care reviewed with attending, Dr. Jerrilyn Messinger  PRIBULA,Daelan Gatt, M.D. 08/10/2011,  7:45 AM   I have personally seen and examined this patient. I agree with the assessment and plan as outlined above. I have discussed cardiac cath in detail and she agrees to proceed.   Alireza Pollack 8:36 AM 08/10/2011  

## 2011-08-11 ENCOUNTER — Encounter (HOSPITAL_COMMUNITY): Payer: Self-pay | Admitting: Physician Assistant

## 2011-08-11 DIAGNOSIS — R079 Chest pain, unspecified: Secondary | ICD-10-CM

## 2011-08-11 LAB — BASIC METABOLIC PANEL
CO2: 24 mEq/L (ref 19–32)
Calcium: 8.5 mg/dL (ref 8.4–10.5)
Creatinine, Ser: 0.71 mg/dL (ref 0.50–1.10)
GFR calc non Af Amer: >90 mL/min (ref >90)
Sodium: 137 mEq/L (ref 135–145)

## 2011-08-11 LAB — HEPATIC FUNCTION PANEL
Alkaline Phosphatase: 54 U/L (ref 39–117)
Indirect Bilirubin: 0.4 mg/dL (ref 0.3–0.9)
Total Bilirubin: 0.5 mg/dL (ref 0.3–1.2)
Total Protein: 6.4 g/dL (ref 6.0–8.3)

## 2011-08-11 MED ORDER — ASPIRIN 81 MG PO TBEC: 81.0000 mg | DELAYED_RELEASE_TABLET | Freq: Every day | ORAL | Status: AC

## 2011-08-11 MED ORDER — METOPROLOL TARTRATE 25 MG PO TABS: 25.0000 mg | ORAL_TABLET | Freq: Two times a day (BID) | ORAL | Status: DC

## 2011-08-11 MED ORDER — NITROGLYCERIN 0.4 MG SL SUBL: 0.4000 mg | SUBLINGUAL_TABLET | SUBLINGUAL | Status: DC | PRN

## 2011-08-11 MED ORDER — NITROFURANTOIN MONOHYD MACRO 100 MG PO CAPS
100.0000 mg | ORAL_CAPSULE | Freq: Two times a day (BID) | ORAL | Status: DC
  Administered 2011-08-11: 100 mg via ORAL
  Filled 2011-08-11 (×2): qty 1

## 2011-08-11 MED ORDER — TICAGRELOR 90 MG PO TABS: 90.0000 mg | ORAL_TABLET | Freq: Two times a day (BID) | ORAL | Status: DC

## 2011-08-11 MED ORDER — ATORVASTATIN CALCIUM 80 MG PO TABS: 80.0000 mg | ORAL_TABLET | Freq: Every day | ORAL | Status: DC

## 2011-08-11 MED ORDER — NITROFURANTOIN MONOHYD MACRO 100 MG PO CAPS: 100.0000 mg | ORAL_CAPSULE | Freq: Two times a day (BID) | ORAL | Status: AC

## 2011-08-11 MED ORDER — CIPROFLOXACIN HCL 250 MG PO TABS
250.0000 mg | ORAL_TABLET | Freq: Two times a day (BID) | ORAL | Status: DC
  Filled 2011-08-11 (×3): qty 1

## 2011-08-11 MED FILL — Nicardipine HCl IV Soln 2.5 MG/ML: INTRAVENOUS | Qty: 1 | Status: AC

## 2011-08-11 NOTE — Progress Notes (Addendum)
CARDIAC REHAB PHASE I   PRE:  Rate/Rhythm: 88 SR    BP: sitting 149/78    SaO2:   MODE:  Ambulation: 680 ft   POST:  Rate/Rhythm: 111 ST    BP: sitting 121/85     SaO2:   No CP walking but did seem SOB. Pt did not seem to notice this much. Sts she can walk stairs normally although it seems this is pts only increase in activity. HR up slightly. Discussed MI and  risk factor modification with weight loss, diet, ex, smoking cessation. Pt seems very overwhelmed and tearful. Mother present and very supportive. Pt requests her name be sent to [facility] although will need financial assist or do maintenance program. [phone]  Harriet Masson CES, ACSM
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
## 2011-08-11 NOTE — ED Provider Notes (Signed)
Medical screening examination/treatment/procedure(s) were conducted as a shared visit with non-physician practitioner(s) and myself.  I personally evaluated the patient during the encounter  Toy Baker, MD 08/11/11 1505
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
## 2011-08-11 NOTE — Discharge Instructions (Signed)
Myocardial Infarction A myocardial infarction (MI) is damage to the heart that is not reversible. It is also called a heart attack. An MI usually occurs when a heart (coronary) artery becomes blocked or narrowed. This cuts off the blood supply to the heart. When one or more of the heart (coronary) arteries becomes blocked, that area of the heart begins to die. This causes pain felt during an MI.  If you think you might be having an MI, call your local emergency services immediately (911 in [place] It is recommended that you take a 162 mg non-enteric coated aspirin if you do not have an aspirin allergy. Do not drive yourself to the hospital or wait to see if your symptoms go away. The sooner MI is treated, the greater the amount of heart muscle saved. Time is muscle. It can save your life. CAUSES  An MI can occur from:  A gradual buildup of a fatty substance called plaque. When plaque builds up in the arteries, this condition is called atherosclerosis. This buildup can block or reduce the blood supply to the heart artery(s).   A sudden plaque rupture within a heart artery that causes a blood clot (thrombus). A blood clot can block the heart artery which does not allow blood flow to the heart.   A severe tightening (spasm) of the heart artery. This is a less common cause of a heart attack. When a heart artery spasms, it cuts off blood flow through the artery. Spasms can occur in heart arteries that do not have atherosclerosis.  RISK FACTORS People at risk for an MI usually have one or more risk factors, such as:  High blood pressure.   High cholesterol.   Smoking.   Gender. Men have a higher heart attack risk.   Overweight/obesity.   Age.   Family history.   Lack of exercise.   Diabetes.   Stress.   Excessive alcohol use.   Street drug use (cocaine and methamphetamines).  SYMPTOMS  MI symptoms can vary, such as:  In both men and women, MI symptoms can include the following:    Chest pain. The chest pain may feel like a crushing, squeezing, or "pressure" type feeling. MI pain can be "referred," meaning pain can be caused in one part of the body but felt in another part of the body. Referred MI pain may occur in the left arm, neck, or jaw. Pain may even be felt in the right arm.   Shortness of breath (dyspnea).   Heartburn or indigestion with or without vomiting, shortness of breath, or sweating (diaphoresis).   Sudden, cold sweats.   Sudden lightheadedness.   Upper back pain.   Women can have unique MI symptoms, such as:   Unexplained feelings of nervousness or anxiety.   Discomfort between the shoulder blades (scapula) or upper back.   Tingling in the hands and arms.   In elderly people (regardless of gender), MI symptoms can be subtle, such as:   Sweating (diaphoresis).   Shortness of breath (dyspnea).   General tiredness (fatigue) or not feeling well (malaise).  DIAGNOSIS  Diagnosis of an MI involves several tests such as:  An assessment of your vital signs such as heart rhythm, blood pressure, respiratory rate, and oxygen level.   An EKG (ECG) to look at the electrical activity of your heart.   Blood tests called cardiac markers are drawn at scheduled times to measure proteins or enzymes released by the damaged heart muscle.   A chest   X-ray.   An echocardiogram to evaluate heart motion and blood flow.   Coronary angiography (cardiac catheterization). This is a diagnostic procedure to look at the heart arteries.  TREATMENT  Acute Intervention. For an MI, the national standard in the [place] is to have an acute intervention in under 90 minutes from the time you get to the hospital. An acute intervention is a special procedure to open up the heart arteries. It is done in a treatment room called a "catheterization lab" (cath lab). Some hospitals do no have a cath lab. If you are having an MI and the hospital does not have a cath lab, the  standard is to transport you to a hospital that has one. In the cath lab, acute intervention includes:  Angioplasty. An angioplasty involves inserting a thin, flexible tube (catheter) into an artery in either your groin or wrist. The catheter is threaded to the heart arteries. A balloon at the end of the catheter is inflated to open a narrowed or blocked heart artery. During an angioplasty procedure, a small mesh tube (stent) may be used to keep the heart artery open. Depending on your condition and health history, one of two types of stents may be placed:   Drug-eluting stent (DES). A DES is coated with a medicine to prevent scar tissue from growing over the stent. With drug-eluting stents, blood thinning medicine will need to be taken for up to a year.   Bare metal stent. This type of stent has no special coating to keep tissue from growing over it. This type of stent is used if you cannot take blood thinning medicine for a prolonged time or you need surgery in the near future. After a bare metal stent is placed, blood thinning medicine will need to be taken for about a month.   If you are taking blood thinning medicine (anti-platelet therapy) after stent placement, do not stop taking it unless your caregiver says it is okay to do so. Make sure you understand how long you need to take the medicine.  Surgical Intervention  If an acute intervention is not successful, surgery may be needed:   Open heart surgery (coronary artery bypass graft, CABG). CABG takes a vein (saphenous vein) from your leg. The vein is then attached to the blocked heart artery which bypasses the blockage. This then allows blood flow to the heart muscle.  Additional Interventions  A "clot buster" medicine (thrombolytic) may be given. This medicine can help break up a clot in the heart artery. This medicine may be given if a person cannot get to a cath lab right away.   Intra-aortic balloon pump (IABP). If you have suffered a  very severe MI and are too unstable to go to the cath lab or to surgery, an IABP may be used. This is a temporary mechanical device used to increase blood flow to the heart and reduce the workload of the heart until you are stable enough to go to the cath lab or surgery.  HOME CARE INSTRUCTIONS After an MI, you may need the following:  Medication. Take medication as directed by your caregiver. Medications after an MI may:   Keep your blood from clotting easily (blood thinners).   Control your blood pressure.   Help lower your cholesterol.   Control abnormal heart rhythms.   Lifestyle changes. Under the guidance of your caregiver, lifestyle changes include:   Quitting smoking, if you smoke. Your caregiver can help you quit.   Being   physically active.   Maintaining a healthy weight.   Eating a heart healthy diet. A dietician can help you learn healthy eating options.   Managing diabetes.   Reducing stress.   Limiting alcohol intake.  SEEK IMMEDIATE MEDICAL CARE IF:   You have severe chest pain, especially if the pain is crushing or pressure-like and spreads to the arms, back, neck, or jaw. This is an emergency. Do not wait to see if the pain will go away. Get medical help at once. Call your local emergency services (911 in the [place] Do not drive yourself to the hospital.   You have shortness of breath during rest, sleep, or with activity.   You have sudden sweating or clammy skin.   You feel sick to your stomach (nauseous) and throw up (vomit).   You suddenly become lightheaded or dizzy.   You feel your heart beating rapidly or you notice "skipped" beats.  MAKE SURE YOU:   Understand these instructions.   Will watch your condition.   Will get help right away if you are not doing well or get worse.  Document Released: 03/16/2005 Document Revised: 03/05/2011 Document Reviewed: 08/13/2010 [employer] Patient Information 2012 [employer].Angioplasty Angioplasty is a  procedure to widen a narrow blood vessel. The procedure is usually done on the blood vessels of the heart (coronary arteries) but may help vessels to other parts of the body such as the legs. When a vessel in the heart becomes partially blocked there is decreased blood flow to that area. This may lead to chest pain or a heart attack (myocardial infarction).  Angioplasty may be done after a procedure that found a problem or as an emergency to treat a heart attack by opening the blocked arteries. The arteries are usually blocked by cholesterol buildup (plaque) in the lining or walls. LET YOUR CAREGIVER KNOW ABOUT:  Allergies.   Medicines taken, including herbs, eyedrops, over-the-counter medicines, and creams.   Use of steroids (by mouth or creams).   Previous problems with anesthetics or numbing medicines.   Possibility of pregnancy, if this applies.   History of blood clots (thrombophlebitis).   History of bleeding or blood problems.   Previous surgery.   Other health problems.  RISKS AND COMPLICATIONS  Damage to the artery.   A blockage may return.   Bleeding at the insertion site.   Blood clot to another part of the body.  BEFORE THE PROCEDURE  Let your caregiver know if you have had an allergy to dyes used in X-ray, or if you have ever had kidney problems or failure.   Do not eat or drink starting from midnight up to the time of the procedure, or as directed.   You may drink enough water to take your medicines the morning of the procedure if you were instructed to do so.   You should be at the hospital or outpatient facility where the procedure is to be done 60 minutes prior to the procedure or as directed.  PROCEDURE  You may be given a medicine to help you relax before and during the procedure through an intravenous (IV) access in your hand or arm.   Medicine that numbs the area (local anesthetic) may be used before inserting the long, thin tube (catheter).   You  will be prepared for the procedure by washing and shaving the area where the catheter will be inserted. This is usually done in the groin.   A catheter will be inserted into an artery   using a guide wire. This is guided under a type of X-ray (fluoroscopy) to the opening of the blocked artery.   Dye is then injected and X-rays are taken.   Once positioned at the narrowed portion of the blood vessel, the balloon is inflated to make the artery wider. Expanding the balloon crushes the plaque into the wall of the vessel and improves the blood flow.   Sometimes the artery may be made wider using a laser or other tools to remove plaque.   When the blood flow is better, the balloon is deflated and the catheter is removed.  AFTER THE PROCEDURE  You will stay in bed for several hours.   The access site will be watched and you will be checked frequently.   Blood tests, X-rays, and an electrocardiogram (EKG) may be done.   You may stay in the hospital overnight for observation.  Document Released: 03/13/2000 Document Revised: 03/05/2011 Document Reviewed: 07/07/2007 [employer] Patient Information 2012 [employer]. 

## 2011-08-11 NOTE — Consult Note (Signed)
Pt here for an AMI. Smokes 3/4 ppd. Overwhelmed but wants to quit and asks for help with quitting. Recommended 21 mg patch to start with. Discussed patch used instructions and how to taper. Pt voices understanding. Referred to 1-800 quit now for f/u and support. Discussed oral fixation substitutes, second hand smoke and in home smoking policy. Reviewed and gave pt Written education/contact information.

## 2011-08-11 NOTE — Care Management Note (Signed)
    Page 1 of 1   08/11/2011     9:19:42 AM   CARE MANAGEMENT NOTE 08/11/2011  Patient:  Rhonda Colon, Rhonda Colon   Account Number:  [id number]  Date Initiated:  08/10/2011  Documentation initiated by:  Junius Creamer  Subjective/Objective Assessment:   adm w mi     Action/Plan:   lives alone, no pcp or ins noted   Anticipated DC Date:  08/12/2011   Anticipated DC Plan:        DC Planning Services  CM consult  Indigent Health Clinic  Medication Assistance      Choice offered to / List presented to:             Status of service:  Completed, signed off Medicare Important Message given?   (If response is "NO", the following Medicare IM given date fields will be blank) Date Medicare IM given:   Date Additional Medicare IM given:    Discharge Disposition:  HOME/SELF CARE  Per UR Regulation:  Reviewed for med. necessity/level of care/duration of stay  If discussed at Long Length of Stay Meetings, dates discussed:    Comments:  08/11/11 0850 Verdis Prime RN MSN CCM Pt's preferred pharmacy has Brilinta in stock.  Discussed patient assistance program with pt's mother - she will assist with application.  D/C summary faxed to [facility], pt and mother understand eligibility process; [facility] will call pt and arrange appt.  08/10/11 1512 Rhonda Jacquet RN MSN CCM Pt to be d/c'd on Brilinta, has card for 30-day supply.  Pt will apply for patient assistance program.  Pt also requests assistance in free clinic as she has no PCP. Faxed pt information and called Rhonda Colon with [facility]. 1615 Pt will be accepted @ [facility] in [facility], faxed history and physical/facesheet as requested.  08/10/11 12:12p debbie dowell rn,bsn

## 2011-08-12 LAB — URINE CULTURE: Colony Count: >=100000

## 2011-08-17 ENCOUNTER — Encounter: Payer: Self-pay | Admitting: *Deleted

## 2011-08-27 ENCOUNTER — Encounter: Payer: Self-pay | Admitting: *Deleted

## 2011-08-28 ENCOUNTER — Encounter: Payer: Self-pay | Admitting: Nurse Practitioner

## 2011-08-28 ENCOUNTER — Ambulatory Visit (INDEPENDENT_AMBULATORY_CARE_PROVIDER_SITE_OTHER): Payer: Self-pay | Admitting: Nurse Practitioner

## 2011-08-28 VITALS — BP 108/76 | HR 66 | Ht 64.0 in | Wt 238.0 lb

## 2011-08-28 DIAGNOSIS — I2510 Atherosclerotic heart disease of native coronary artery without angina pectoris: Secondary | ICD-10-CM

## 2011-08-28 DIAGNOSIS — E785 Hyperlipidemia, unspecified: Secondary | ICD-10-CM

## 2011-08-28 MED ORDER — CLOPIDOGREL BISULFATE 75 MG PO TABS: 75.0000 mg | ORAL_TABLET | Freq: Every day | ORAL | Status: DC

## 2011-08-28 NOTE — Patient Instructions (Addendum)
Your physician recommends that you schedule a follow-up appointment in: 3 months with Dr Antoine Poche Your physician has recommended you make the following change in your medication: START Plavix 75 mg daily the day after your last dose of Brilinta

## 2011-08-28 NOTE — Progress Notes (Signed)
Patient Name: Rhonda Colon Date of Encounter: 08/28/2011  Primary Cardiologist:  J. Hochrein, MD  Patient Profile  36 y/o female s/p recent nstemi who presents for hosp f/u.  Problem List   Past Medical History  Diagnosis Date  . CAD (coronary artery disease)     a. 07/2011 NSTEMI/Cath: RCA 99%m, otw nl cors, EF 50%, inf hk -> RCA stented with 3.0x56mm Promus DES  . Hyperlipidemia   . Sinus bradycardia     a. Nocturnal, asymptomatic  . Morgagni hernia     a. Discovered incidentally on CT 07/2011 ->f/u with Dr. Lindie Spruce  . Leukocytosis     a. Noted 07/2011 (pt to f/u as OP to ensure resolution)  . Tobacco abuse     a. quit 07/2011   Past Surgical History  Procedure Date  . Tonsillectomy   . Cardiac catheterization     left heart, with angiogram    Allergies  No Known Allergies  HPI  36 y/o female with the above problem list.  She is recently s/p NSTEMI with DES to the RCA.  Since d/c, she has not had any chest pain or dyspnea and has increased her activity to walking a mile/day.  She is trying to lose weight but so far is disappointed with the results.  She has quit smoking.  She denies pnd, orthopnea, n, v, dizziness, syncope, edema, or early satiety.  Home Medications  Prior to Admission medications   Medication Sig Start Date End Date Taking? Authorizing Provider  aspirin EC 81 MG EC tablet Take 1 tablet (81 mg total) by mouth daily. 08/11/11 08/10/12 Yes Dayna N Dunn, PA  atorvastatin (LIPITOR) 80 MG tablet Take 1 tablet (80 mg total) by mouth at bedtime. 08/11/11 08/10/12 Yes Dayna N Dunn, PA  metoprolol tartrate (LOPRESSOR) 25 MG tablet Take 1 tablet (25 mg total) by mouth 2 (two) times daily. 08/11/11 08/10/12 Yes Dayna N Dunn, PA  nitroGLYCERIN (NITROSTAT) 0.4 MG SL tablet Place 1 tablet (0.4 mg total) under the tongue every 5 (five) minutes as needed for chest pain (up to 3 doses). 08/11/11 08/10/12 Yes Dayna N Dunn, PA  clopidogrel (PLAVIX) 75 MG tablet Take 1 tablet  (75 mg total) by mouth daily. 08/28/11 08/27/12  Ok Anis, NP    Family History  Family History  Problem Relation Age of Onset  . Coronary artery disease Mother 63     stent  . Coronary artery disease Maternal Aunt 35    (Mother's twin sister)  . Coronary artery disease Maternal Uncle 46  . Heart attack Mother   . Heart attack Maternal Aunt     mother's twin  . Heart attack Maternal Uncle    Review of Systems She reports feeling depressed re: her recent MI but is working through it.  All other systems reviewed and are otherwise negative except as noted above.  Physical Exam  Blood pressure 108/76, pulse 66, height 5\' 4"  (1.626 m), weight 238 lb (107.956 kg), last menstrual period 07/19/2011.  General: Pleasant, NAD Psych: Normal affect. Neuro: Alert and oriented X 3. Moves all extremities spontaneously. HEENT: Normal  Neck: Supple without bruits or JVD. Lungs:  Resp regular and unlabored, CTA. Heart: RRR no s3, s4, or murmurs. Abdomen: Soft, non-tender, non-distended, BS + x 4.  Extremities: No clubbing, cyanosis or edema. DP/PT/Radials 2+ and equal bilaterally.  R wrist cath site is clean and dry w/o b/b/h.  Assessment & Plan  1.  CAD:  S/p  recent nstemi with des to the rca.  She is doing well w/o c/p or dyspnea.  She is tolerating her meds well.  She does not have insurance but does work full-time.  She has applied for brilinta assistance but so far hasn't qualified.  She has asked if she can be switched to plavix.  I discussed with Dr. Clifton James, who did her procedure, and have provided her with a Rx for Plavix 75mg  daily.  She will not require a loading dose.  I advised that she should start plavix within 12 hrs of her last brilinta dose (once she runs out of her 30 day sample).  Cont asa, statin, bb.  2.  HL:  LDL was 148 in the hospital.  Plan to f/u in 8 wks.  Cont high dose statin.  3.  Tob Abuse:  She has quit.  Continued cessation encouraged.  4.  Dispo:   F/u Dr. Antoine Poche in 3 months.  Nicolasa Ducking, NP 08/28/2011, 3:03 PM

## 2011-10-23 ENCOUNTER — Other Ambulatory Visit (INDEPENDENT_AMBULATORY_CARE_PROVIDER_SITE_OTHER): Payer: Self-pay

## 2011-10-23 DIAGNOSIS — E785 Hyperlipidemia, unspecified: Secondary | ICD-10-CM

## 2011-10-23 LAB — HEPATIC FUNCTION PANEL
ALT: 21 U/L (ref 0–35)
AST: 19 U/L (ref 0–37)
Albumin: 3.7 g/dL (ref 3.5–5.2)
Alkaline Phosphatase: 56 U/L (ref 39–117)
Bilirubin, Direct: 0.1 mg/dL (ref 0.0–0.3)
Total Bilirubin: 0.8 mg/dL (ref 0.3–1.2)
Total Protein: 6.9 g/dL (ref 6.0–8.3)

## 2011-10-23 LAB — LIPID PANEL
LDL Cholesterol: 68 mg/dL (ref 0–99)
VLDL: 17.2 mg/dL (ref 0.0–40.0)

## 2011-12-01 ENCOUNTER — Ambulatory Visit (INDEPENDENT_AMBULATORY_CARE_PROVIDER_SITE_OTHER): Payer: Self-pay | Admitting: Cardiology

## 2011-12-01 ENCOUNTER — Encounter: Payer: Self-pay | Admitting: Cardiology

## 2011-12-01 VITALS — BP 129/81 | HR 79 | Ht 65.0 in | Wt 207.8 lb

## 2011-12-01 DIAGNOSIS — F17200 Nicotine dependence, unspecified, uncomplicated: Secondary | ICD-10-CM

## 2011-12-01 DIAGNOSIS — Z720 Tobacco use: Secondary | ICD-10-CM

## 2011-12-01 DIAGNOSIS — I2510 Atherosclerotic heart disease of native coronary artery without angina pectoris: Secondary | ICD-10-CM

## 2011-12-01 MED ORDER — ATORVASTATIN CALCIUM 80 MG PO TABS: 80.0000 mg | ORAL_TABLET | Freq: Every day | ORAL | Status: DC

## 2011-12-01 MED ORDER — CLOPIDOGREL BISULFATE 75 MG PO TABS: 75.0000 mg | ORAL_TABLET | Freq: Every day | ORAL | Status: DC

## 2011-12-01 NOTE — Patient Instructions (Addendum)
The current medical regimen is effective;  continue present plan and medications.  Follow up in 6 months with Dr Hochrein.  You will receive a letter in the mail 2 months before you are due.  Please call us when you receive this letter to schedule your follow up appointment.  

## 2011-12-01 NOTE — Progress Notes (Signed)
   HPI The patient presents for followup after her MI earlier this year. Since I last saw her she has done remarkably well. He is eating right, exercising routinely, not smoking cigarettes and is lost weight. She feels great.  The patient denies any new symptoms such as chest discomfort, neck or arm discomfort. There has been no new shortness of breath, PND or orthopnea. There have been no reported palpitations, presyncope or syncope.  Tingling occasionally in her feet and hands.  Of note she was taken off of her beta blocker because of depressive symptoms.  She has done well since this time.  No Known Allergies  Current Outpatient Prescriptions  Medication Sig Dispense Refill  . aspirin EC 81 MG EC tablet Take 1 tablet (81 mg total) by mouth daily.      [name] atorvastatin (LIPITOR) 80 MG tablet Take 1 tablet (80 mg total) by mouth at bedtime.  30 tablet  6  . clopidogrel (PLAVIX) 75 MG tablet Take 1 tablet (75 mg total) by mouth daily.  30 tablet  11  . nitroGLYCERIN (NITROSTAT) 0.4 MG SL tablet Place 1 tablet (0.4 mg total) under the tongue every 5 (five) minutes as needed for chest pain (up to 3 doses).  25 tablet  4    Past Medical History  Diagnosis Date  . CAD (coronary artery disease)     a. 07/2011 NSTEMI/Cath: RCA 99%m, otw nl cors, EF 50%, inf hk -> RCA stented with 3.0x101mm Promus DES  . Hyperlipidemia   . Sinus bradycardia     a. Nocturnal, asymptomatic  . Morgagni hernia     a. Discovered incidentally on CT 07/2011 ->f/u with Dr. Lindie Spruce  . Leukocytosis     a. Noted 07/2011 (pt to f/u as OP to ensure resolution)  . Tobacco abuse     a. quit 07/2011    Past Surgical History  Procedure Date  . Tonsillectomy   . Cardiac catheterization     left heart, with angiogram    ROS:  As stated in the HPI and negative for all other systems.  PHYSICAL EXAM BP 129/81  Pulse 79  Ht 5\' 5"  (1.651 m)  Wt 207 lb 12.8 oz (94.257 kg)  BMI 34.58 kg/m2 GENERAL:  Well appearing HEENT:  Pupils  equal round and reactive, fundi not visualized, oral mucosa unremarkable NECK:  No jugular venous distention, waveform within normal limits, carotid upstroke brisk and symmetric, no bruits, no thyromegaly LYMPHATICS:  No cervical, inguinal adenopathy LUNGS:  Clear to auscultation bilaterally BACK:  No CVA tenderness CHEST:  Unremarkable HEART:  PMI not displaced or sustained,S1 and S2 within normal limits, no S3, no S4, no clicks, no rubs, no murmurs ABD:  Flat, positive bowel sounds normal in frequency in pitch, no bruits, no rebound, no guarding, no midline pulsatile mass, no hepatomegaly, no splenomegaly EXT:  2 plus pulses throughout, no edema, no cyanosis no clubbing  EKG:  Sinus rhythm, rate 74, axis within normal limits, intervals within normal limits, no acute ST-T wave changes.  12/01/2011   ASSESSMENT AND PLAN  CAD: The patient has no new sypmtoms.  No further cardiovascular testing is indicated.  We will continue with aggressive risk reduction and meds as listed.  HL:   She is at target and she will continue with current therapy.  Tob Abuse: She continues to abstain. I applauded this.

## 2012-03-07 ENCOUNTER — Telehealth: Payer: Self-pay | Admitting: Cardiology

## 2012-03-07 NOTE — Telephone Encounter (Signed)
Pt returning nurse call at [phone]

## 2012-03-07 NOTE — Telephone Encounter (Signed)
Left message for pt at number listed that I have not called her however if she needed me for something to please call back

## 2012-05-18 ENCOUNTER — Telehealth: Payer: Self-pay | Admitting: Cardiology

## 2012-05-18 NOTE — Telephone Encounter (Signed)
Are there any labs you would like for pt to have prior to her appt? 3/3

## 2012-05-18 NOTE — Telephone Encounter (Signed)
New Problem    Pt would like to know if she needs labs completed for her appt.

## 2012-05-22 NOTE — Telephone Encounter (Signed)
No labs needed

## 2012-05-23 NOTE — Telephone Encounter (Signed)
Left message no lab needed.  Requested pt call back with any questions

## 2012-05-30 ENCOUNTER — Encounter: Payer: Self-pay | Admitting: Cardiology

## 2012-05-30 ENCOUNTER — Ambulatory Visit (INDEPENDENT_AMBULATORY_CARE_PROVIDER_SITE_OTHER): Payer: Self-pay | Admitting: Cardiology

## 2012-05-30 VITALS — BP 122/64 | HR 52 | Ht 64.5 in | Wt 158.6 lb

## 2012-05-30 DIAGNOSIS — I219 Acute myocardial infarction, unspecified: Secondary | ICD-10-CM

## 2012-05-30 DIAGNOSIS — Z79899 Other long term (current) drug therapy: Secondary | ICD-10-CM

## 2012-05-30 DIAGNOSIS — F17200 Nicotine dependence, unspecified, uncomplicated: Secondary | ICD-10-CM

## 2012-05-30 DIAGNOSIS — E7800 Pure hypercholesterolemia, unspecified: Secondary | ICD-10-CM

## 2012-05-30 DIAGNOSIS — Z720 Tobacco use: Secondary | ICD-10-CM

## 2012-05-30 MED ORDER — CLOPIDOGREL BISULFATE 75 MG PO TABS: 75.0000 mg | ORAL_TABLET | Freq: Every day | ORAL | Status: DC

## 2012-05-30 NOTE — Patient Instructions (Addendum)
Please stop your Plavix after 08/09/12. Continue all other medications as listed  Please return fasting for blood work.  Follow up in 1 year with Dr Antoine Poche.  You will receive a letter in the mail 2 months before you are due.  Please call us when you receive this letter to schedule your follow up appointment.

## 2012-05-30 NOTE — Progress Notes (Signed)
HPI The patient presents for followup after her MI earlier last year. Since I last saw her she has done remarkably well.  She has lost 100 lbs. Shee is eating right, exercising routinely, not smoking cigarettes and is lost weight. She feels great.  The patient denies any new symptoms such as chest discomfort, neck or arm discomfort. There has been no new shortness of breath, PND or orthopnea. There have been no reported palpitations, presyncope or syncope.  Of note she was taken off of her beta blocker because of depressive symptoms.    No Known Allergies  Current Outpatient Prescriptions  Medication Sig Dispense Refill  . Ascorbic Acid (VITAMIN C) 1000 MG tablet Take 1,000 mg by mouth daily.      [name] aspirin EC 81 MG EC tablet Take 1 tablet (81 mg total) by mouth daily.      [name] atorvastatin (LIPITOR) 80 MG tablet Take 1 tablet (80 mg total) by mouth at bedtime.  30 tablet  6  . cholecalciferol (VITAMIN D) 1000 UNITS tablet Take 1,000 Units by mouth daily.      . clopidogrel (PLAVIX) 75 MG tablet Take 1 tablet (75 mg total) by mouth daily.  30 tablet  6  . fish oil-omega-3 fatty acids 1000 MG capsule Take 2 g by mouth 2 (two) times daily.       . nitroGLYCERIN (NITROSTAT) 0.4 MG SL tablet Place 1 tablet (0.4 mg total) under the tongue every 5 (five) minutes as needed for chest pain (up to 3 doses).  25 tablet  4  . vitamin E 400 UNIT capsule Take 400 Units by mouth daily.       No current facility-administered medications for this visit.    Past Medical History  Diagnosis Date  . CAD (coronary artery disease)     a. 07/2011 NSTEMI/Cath: RCA 99%m, otw nl cors, EF 50%, inf hk -> RCA stented with 3.0x53mm Promus DES  . Hyperlipidemia   . Sinus bradycardia     a. Nocturnal, asymptomatic  . Morgagni hernia     a. Discovered incidentally on CT 07/2011 ->f/u with Dr. Lindie Spruce  . Leukocytosis     a. Noted 07/2011 (pt to f/u as OP to ensure resolution)  . Tobacco abuse     a. quit 07/2011    Past  Surgical History  Procedure Laterality Date  . Tonsillectomy    . Cardiac catheterization      left heart, with angiogram    ROS:  As stated in the HPI and negative for all other systems.  PHYSICAL EXAM BP 122/64  Pulse 52  Ht 5' 4.5" (1.638 m)  Wt 158 lb 9.6 oz (71.94 kg)  BMI 26.81 kg/m2 GENERAL:  Well appearing HEENT:  Pupils equal round and reactive, fundi not visualized, oral mucosa unremarkable NECK:  No jugular venous distention, waveform within normal limits, carotid upstroke brisk and symmetric, no bruits, no thyromegaly LYMPHATICS:  No cervical, inguinal adenopathy LUNGS:  Clear to auscultation bilaterally BACK:  No CVA tenderness CHEST:  Unremarkable HEART:  PMI not displaced or sustained,S1 and S2 within normal limits, no S3, no S4, no clicks, no rubs, no murmurs ABD:  Flat, positive bowel sounds normal in frequency in pitch, no bruits, no rebound, no guarding, no midline pulsatile mass, no hepatomegaly, no splenomegaly EXT:  2 plus pulses throughout, no edema, no cyanosis no clubbing  EKG:  Sinus rhythm, rate 52, axis within normal limits, intervals within normal limits, no acute ST-T wave  changes.  05/30/2012  ASSESSMENT AND PLAN  CAD: The patient has no new sypmtoms.  No further cardiovascular testing is indicated.  She will stop her Plavix in May after the one year anniversary of her stent.    HL:   She is at target and she will continue with current therapy.  I will check a lipid and liver  Tob Abuse: She continues to abstain. I applauded this.  Weight:  She has lost 100 lbs.  She is the most significant lifestyle success story that I have ever seen!!

## 2012-06-01 ENCOUNTER — Encounter: Payer: Self-pay | Admitting: Nurse Practitioner

## 2012-06-02 ENCOUNTER — Telehealth: Payer: Self-pay | Admitting: Cardiology

## 2012-06-02 ENCOUNTER — Other Ambulatory Visit: Payer: Self-pay

## 2012-06-02 DIAGNOSIS — I2510 Atherosclerotic heart disease of native coronary artery without angina pectoris: Secondary | ICD-10-CM

## 2012-06-02 MED ORDER — ATORVASTATIN CALCIUM 80 MG PO TABS: 80.0000 mg | ORAL_TABLET | Freq: Every day | ORAL | Status: DC

## 2012-06-02 NOTE — Telephone Encounter (Signed)
All questions answered concerning my chart and Lipitor refill sent to [facility] to fill, pt aware.

## 2012-06-02 NOTE — Telephone Encounter (Signed)
New problem   Pt need a refill on Atorvastatin 80mg . On MyChart she has question about how to update her information. Please call pt concerning this matter

## 2012-06-03 ENCOUNTER — Other Ambulatory Visit: Payer: Self-pay

## 2012-06-06 ENCOUNTER — Other Ambulatory Visit (INDEPENDENT_AMBULATORY_CARE_PROVIDER_SITE_OTHER): Payer: Self-pay

## 2012-06-06 DIAGNOSIS — E7800 Pure hypercholesterolemia, unspecified: Secondary | ICD-10-CM

## 2012-06-06 DIAGNOSIS — Z79899 Other long term (current) drug therapy: Secondary | ICD-10-CM

## 2012-06-06 LAB — LIPID PANEL
HDL: 41 mg/dL (ref >39.00)
LDL Cholesterol: 54 mg/dL (ref 0–99)
Total CHOL/HDL Ratio: 2
Triglycerides: 34 mg/dL (ref 0.0–149.0)
VLDL: 6.8 mg/dL (ref 0.0–40.0)

## 2012-06-06 LAB — HEPATIC FUNCTION PANEL
Bilirubin, Direct: 0.1 mg/dL (ref 0.0–0.3)
Total Bilirubin: 0.8 mg/dL (ref 0.3–1.2)

## 2012-06-10 ENCOUNTER — Encounter: Payer: Self-pay | Admitting: Cardiology

## 2012-06-10 NOTE — Telephone Encounter (Signed)
Called patient with lipid and liver results which she is able to view in" my chart" but she still would like Dr. Antoine Poche to call her. Will forward to MD.

## 2012-12-19 ENCOUNTER — Encounter: Payer: Self-pay | Admitting: Cardiology

## 2013-01-11 ENCOUNTER — Other Ambulatory Visit: Payer: Self-pay | Admitting: *Deleted

## 2013-01-11 ENCOUNTER — Encounter: Payer: Self-pay | Admitting: Cardiology

## 2013-01-11 MED ORDER — ATORVASTATIN CALCIUM 80 MG PO TABS: 40.0000 mg | ORAL_TABLET | Freq: Every day | ORAL | Status: DC

## 2013-02-02 ENCOUNTER — Other Ambulatory Visit: Payer: Self-pay

## 2013-04-18 ENCOUNTER — Telehealth: Payer: Self-pay | Admitting: Cardiology

## 2013-04-18 NOTE — Telephone Encounter (Signed)
Pt has an appointment with Dr. Antoine PocheHochrein on 06/02/13 at 9:15 AM pt will come in NPO so she can have her Lipid panel and any other lab work she need to have per MD orders, because she did not want to return another day for blood work. Pt is aware that I will let Md and nurse know about it.

## 2013-04-18 NOTE — Telephone Encounter (Signed)
New Problem:  Pt is requesting to have blood work the same morning of her appt on 06/02/13. PT does not have any orders in Epic. Pt would like a call back letting her know if she can have her labs checked.

## 2013-04-19 NOTE — Telephone Encounter (Signed)
Will forward to MD for review and orders for any other lab work.

## 2013-05-30 ENCOUNTER — Encounter: Payer: Self-pay | Admitting: Cardiology

## 2013-05-30 ENCOUNTER — Ambulatory Visit (INDEPENDENT_AMBULATORY_CARE_PROVIDER_SITE_OTHER): Payer: BC Managed Care – PPO | Admitting: Cardiology

## 2013-05-30 VITALS — BP 118/68 | HR 53 | Ht 64.0 in | Wt 158.0 lb

## 2013-05-30 DIAGNOSIS — Z79899 Other long term (current) drug therapy: Secondary | ICD-10-CM

## 2013-05-30 DIAGNOSIS — F17200 Nicotine dependence, unspecified, uncomplicated: Secondary | ICD-10-CM

## 2013-05-30 DIAGNOSIS — I2510 Atherosclerotic heart disease of native coronary artery without angina pectoris: Secondary | ICD-10-CM

## 2013-05-30 DIAGNOSIS — E7800 Pure hypercholesterolemia, unspecified: Secondary | ICD-10-CM

## 2013-05-30 DIAGNOSIS — Z720 Tobacco use: Secondary | ICD-10-CM

## 2013-05-30 LAB — CBC
HCT: 41.8 % (ref 36.0–46.0)
Hemoglobin: 13.7 g/dL (ref 12.0–15.0)
MCHC: 32.9 g/dL (ref 30.0–36.0)
MCV: 95.6 fl (ref 78.0–100.0)
Platelets: 237 10*3/uL (ref 150.0–400.0)
RBC: 4.37 Mil/uL (ref 3.87–5.11)
RDW: 13.1 % (ref 11.5–14.6)
WBC: 7.7 10*3/uL (ref 4.5–10.5)

## 2013-05-30 LAB — COMPREHENSIVE METABOLIC PANEL
ALBUMIN: 3.8 g/dL (ref 3.5–5.2)
ALT: 30 U/L (ref 0–35)
AST: 26 U/L (ref 0–37)
Alkaline Phosphatase: 54 U/L (ref 39–117)
BILIRUBIN TOTAL: 1.1 mg/dL (ref 0.3–1.2)
BUN: 15 mg/dL (ref 6–23)
CO2: 28 mEq/L (ref 19–32)
Calcium: 8.9 mg/dL (ref 8.4–10.5)
Chloride: 103 mEq/L (ref 96–112)
Creatinine, Ser: 0.7 mg/dL (ref 0.4–1.2)
GFR: 107.4 mL/min (ref >60.00)
GLUCOSE: 70 mg/dL (ref 70–99)
POTASSIUM: 3.8 meq/L (ref 3.5–5.1)
Sodium: 137 mEq/L (ref 135–145)
Total Protein: 6.6 g/dL (ref 6.0–8.3)

## 2013-05-30 LAB — LIPID PANEL
CHOLESTEROL: 122 mg/dL (ref 0–200)
HDL: 57.2 mg/dL (ref >39.00)
LDL Cholesterol: 60 mg/dL (ref 0–99)
Total CHOL/HDL Ratio: 2
Triglycerides: 25 mg/dL (ref 0.0–149.0)
VLDL: 5 mg/dL (ref 0.0–40.0)

## 2013-05-30 NOTE — Progress Notes (Signed)
HPI The patient presents for followup after a previous MI . Since I last saw her she has done very well.  She continues to eat right, exercise routinely, and is not smoking cigarettes.   The patient denies any new symptoms such as chest discomfort, neck or arm discomfort. There has been no new shortness of breath, PND or orthopnea. There have been no reported palpitations, presyncope or syncope. She has had none of the symptoms that she had at the time of her previous MI.    No Known Allergies  Current Outpatient Prescriptions  Medication Sig Dispense Refill  . aspirin 81 MG tablet Take 81 mg by mouth daily.      [name]. atorvastatin (LIPITOR) 80 MG tablet Take 0.5 tablets (40 mg total) by mouth at bedtime.  90 tablet  3  . Multiple Vitamin (MULTIVITAMIN) capsule Take 1 capsule by mouth daily.      [name]. omeprazole (PRILOSEC) 20 MG capsule Take 20 mg by mouth daily.      [name]. RA KRILL OIL 500 MG CAPS Take by mouth.      . nitroGLYCERIN (NITROSTAT) 0.4 MG SL tablet Place 1 tablet (0.4 mg total) under the tongue every 5 (five) minutes as needed for chest pain (up to 3 doses).  25 tablet  4   No current facility-administered medications for this visit.    Past Medical History  Diagnosis Date  . CAD (coronary artery disease)     a. 07/2011 NSTEMI/Cath: RCA 99%m, otw nl cors, EF 50%, inf hk -> RCA stented with 3.0x5116mm Promus DES  . Hyperlipidemia   . Sinus bradycardia     a. Nocturnal, asymptomatic  . Morgagni hernia     a. Discovered incidentally on CT 07/2011 ->f/u with Dr. Lindie SpruceWyatt  . Leukocytosis     a. Noted 07/2011 (pt to f/u as OP to ensure resolution)  . Tobacco abuse     a. quit 07/2011    Past Surgical History  Procedure Laterality Date  . Tonsillectomy    . Cardiac catheterization      left heart, with angiogram    ROS:  As stated in the HPI and negative for all other systems.  PHYSICAL EXAM BP 118/68  Pulse 53  Ht 5\' 4"  (1.626 m)  Wt 158 lb (71.668 kg)  BMI 27.11 kg/m2 GENERAL:   Well appearing HEENT:  Pupils equal round and reactive, fundi not visualized, oral mucosa unremarkable NECK:  No jugular venous distention, waveform within normal limits, carotid upstroke brisk and symmetric, no bruits, no thyromegaly LYMPHATICS:  No cervical, inguinal adenopathy LUNGS:  Clear to auscultation bilaterally BACK:  No CVA tenderness CHEST:  Unremarkable HEART:  PMI not displaced or sustained,S1 and S2 within normal limits, no S3, no S4, no clicks, no rubs, no murmurs ABD:  Flat, positive bowel sounds normal in frequency in pitch, no bruits, no rebound, no guarding, no midline pulsatile mass, no hepatomegaly, no splenomegaly EXT:  2 plus pulses throughout, no edema, no cyanosis no clubbing  EKG:  Sinus rhythm, rate 53, axis within normal limits, intervals within normal limits, no acute ST-T wave changes.  05/30/2013  ASSESSMENT AND PLAN  CAD: The patient has no new sypmtoms.  No further cardiovascular testing is indicated.  She had stopped her Plavix last year so I will not restart this.  I reviewed her cath results with her.    HL:   She is at target and she will continue with current therapy.  I will  check a lipid and liver  Tob Abuse: She continues to abstain. I applauded this.   Weight:  She has a BMI of 27 and she is concerned.  I assured her that fitness trumps BMI in importance as recent data demonstrates.

## 2013-05-30 NOTE — Patient Instructions (Addendum)
The current medical regimen is effective;  continue present plan and medications.  Please have blood work today (Lipid, CMP and CBC)  Follow up in 1 year with Dr Antoine PocheHochrein.  You will receive a letter in the mail 2 months before you are due.  Please call us when you receive this letter to schedule your follow up appointment.   Dr Sanda Lingerhomas Jones [address] [place], [place] [address] [phone]

## 2013-06-02 ENCOUNTER — Ambulatory Visit: Payer: Self-pay | Admitting: Cardiology

## 2013-06-13 LAB — HM PAP SMEAR: HM PAP: NORMAL

## 2013-07-16 ENCOUNTER — Encounter: Payer: Self-pay | Admitting: Cardiology

## 2013-08-08 ENCOUNTER — Encounter: Payer: Self-pay | Admitting: Internal Medicine

## 2013-08-08 ENCOUNTER — Ambulatory Visit (INDEPENDENT_AMBULATORY_CARE_PROVIDER_SITE_OTHER): Payer: BC Managed Care – PPO | Admitting: Internal Medicine

## 2013-08-08 VITALS — BP 124/72 | HR 60 | Temp 98.5°F | Resp 16 | Ht 64.0 in | Wt 158.0 lb

## 2013-08-08 DIAGNOSIS — L568 Other specified acute skin changes due to ultraviolet radiation: Secondary | ICD-10-CM | POA: Diagnosis not present

## 2013-08-08 DIAGNOSIS — L563 Solar urticaria: Secondary | ICD-10-CM | POA: Insufficient documentation

## 2013-08-08 DIAGNOSIS — Z23 Encounter for immunization: Secondary | ICD-10-CM | POA: Diagnosis not present

## 2013-08-08 LAB — HM MAMMOGRAPHY

## 2013-08-08 MED ORDER — CETIRIZINE HCL 10 MG PO TABS: 10.0000 mg | ORAL_TABLET | Freq: Every day | ORAL | Status: DC

## 2013-08-08 NOTE — Progress Notes (Signed)
Pre visit review using our clinic review tool, if applicable. No additional management support is needed unless otherwise documented below in the visit note. 

## 2013-08-08 NOTE — Patient Instructions (Signed)

## 2013-08-08 NOTE — Progress Notes (Signed)
   Subjective:    Patient ID: Rhonda Colon, female    DOB: 03/26/1976, 38 y.o.   MRN: [id number]  Rash This is a chronic problem. The current episode started more than 1 year ago. The problem is unchanged. The affected locations include the left arm, right arm, right lower leg and left lower leg. The rash is characterized by itchiness, redness and scaling. Associated with: the sun. Pertinent negatives include no anorexia, congestion, cough, diarrhea, eye pain, facial edema, fatigue, fever, joint pain, nail changes, rhinorrhea, shortness of breath, sore throat or vomiting. Treatments tried: benadrul but it makes her too sleepy. The treatment provided mild relief. Her past medical history is significant for allergies. There is no history of asthma, eczema or varicella.      Review of Systems  Constitutional: Negative for fever and fatigue.  HENT: Negative for congestion, rhinorrhea and sore throat.   Eyes: Negative for pain.  Respiratory: Negative for cough and shortness of breath.   Gastrointestinal: Negative for vomiting, diarrhea and anorexia.  Musculoskeletal: Negative for joint pain.  Skin: Positive for rash. Negative for nail changes.  All other systems reviewed and are negative.      Objective:   Physical Exam  Vitals reviewed. Constitutional: She is oriented to person, place, and time. She appears well-developed and well-nourished. No distress.  HENT:  Head: Normocephalic and atraumatic.  Mouth/Throat: Oropharynx is clear and moist. No oropharyngeal exudate.  Eyes: Conjunctivae are normal. Right eye exhibits no discharge. Left eye exhibits no discharge. No scleral icterus.  Neck: Normal range of motion. Neck supple. No JVD present. No tracheal deviation present. No thyromegaly present.  Cardiovascular: Normal rate, regular rhythm, normal heart sounds and intact distal pulses.  Exam reveals no gallop and no friction rub.   No murmur heard. Pulmonary/Chest: Effort normal and  breath sounds normal. No stridor. No respiratory distress. She has no wheezes. She has no rales. She exhibits no tenderness.  Abdominal: Soft. Bowel sounds are normal. She exhibits no distension and no mass. There is no tenderness. There is no rebound and no guarding.  Musculoskeletal: Normal range of motion. She exhibits no edema and no tenderness.  Lymphadenopathy:    She has no cervical adenopathy.  Neurological: She is oriented to person, place, and time.  Skin: Skin is warm, dry and intact. Rash noted. No abrasion, no bruising, no burn, no ecchymosis, no laceration, no lesion, no petechiae and no purpura noted. Rash is papular and urticarial. Rash is not macular, not maculopapular, not nodular, not pustular and not vesicular. She is not diaphoretic. No erythema. No pallor.     Psychiatric: She has a normal mood and affect. Her behavior is normal. Judgment and thought content normal.     Lab Results  Component Value Date   WBC 7.7 05/30/2013   HGB 13.7 05/30/2013   HCT 41.8 05/30/2013   PLT 237.0 05/30/2013   GLUCOSE 70 05/30/2013   CHOL 122 05/30/2013   TRIG 25.0 05/30/2013   HDL 57.20 05/30/2013   LDLCALC 60 05/30/2013   ALT 30 05/30/2013   AST 26 05/30/2013   NA 137 05/30/2013   K 3.8 05/30/2013   CL 103 05/30/2013   CREATININE 0.7 05/30/2013   BUN 15 05/30/2013   CO2 28 05/30/2013   TSH 1.276 08/09/2011   INR 0.96 08/10/2011   HGBA1C 5.3 08/10/2011       Assessment & Plan:

## 2013-08-15 ENCOUNTER — Other Ambulatory Visit: Payer: Self-pay | Admitting: Internal Medicine

## 2013-08-15 DIAGNOSIS — L563 Solar urticaria: Secondary | ICD-10-CM

## 2013-08-15 MED ORDER — MONTELUKAST SODIUM 10 MG PO TABS
10.0000 mg | ORAL_TABLET | Freq: Every day | ORAL | Status: DC
Start: 2013-08-15 — End: 2014-05-14

## 2013-12-25 ENCOUNTER — Other Ambulatory Visit: Payer: Self-pay

## 2013-12-25 MED ORDER — NITROGLYCERIN 0.4 MG SL SUBL: 0.4000 mg | SUBLINGUAL_TABLET | SUBLINGUAL | Status: DC | PRN

## 2013-12-25 MED ORDER — ATORVASTATIN CALCIUM 40 MG PO TABS: 40.0000 mg | ORAL_TABLET | Freq: Every day | ORAL | Status: DC

## 2013-12-26 ENCOUNTER — Other Ambulatory Visit: Payer: Self-pay | Admitting: *Deleted

## 2013-12-26 MED ORDER — NITROGLYCERIN 0.4 MG SL SUBL: 0.4000 mg | SUBLINGUAL_TABLET | SUBLINGUAL | Status: DC | PRN

## 2014-03-01 ENCOUNTER — Encounter: Payer: Self-pay | Admitting: Internal Medicine

## 2014-03-08 ENCOUNTER — Encounter (HOSPITAL_COMMUNITY): Payer: Self-pay | Admitting: Cardiovascular Disease

## 2014-05-14 ENCOUNTER — Encounter: Payer: Self-pay | Admitting: Nurse Practitioner

## 2014-05-14 ENCOUNTER — Other Ambulatory Visit (INDEPENDENT_AMBULATORY_CARE_PROVIDER_SITE_OTHER): Payer: 59

## 2014-05-14 ENCOUNTER — Ambulatory Visit (INDEPENDENT_AMBULATORY_CARE_PROVIDER_SITE_OTHER): Payer: 59 | Admitting: Nurse Practitioner

## 2014-05-14 VITALS — BP 136/86 | HR 60 | Temp 98.5°F | Resp 16 | Ht 64.0 in | Wt 171.8 lb

## 2014-05-14 DIAGNOSIS — R1033 Periumbilical pain: Secondary | ICD-10-CM

## 2014-05-14 LAB — COMPREHENSIVE METABOLIC PANEL
ALT: 18 U/L (ref 0–35)
AST: 19 U/L (ref 0–37)
Albumin: 4.2 g/dL (ref 3.5–5.2)
Alkaline Phosphatase: 55 U/L (ref 39–117)
BUN: 14 mg/dL (ref 6–23)
CHLORIDE: 102 meq/L (ref 96–112)
CO2: 30 meq/L (ref 19–32)
CREATININE: 0.72 mg/dL (ref 0.40–1.20)
Calcium: 9.4 mg/dL (ref 8.4–10.5)
GFR: 94.98 mL/min (ref >60.00)
GLUCOSE: 99 mg/dL (ref 70–99)
Potassium: 4 mEq/L (ref 3.5–5.1)
SODIUM: 137 meq/L (ref 135–145)
Total Bilirubin: 0.6 mg/dL (ref 0.2–1.2)
Total Protein: 7.4 g/dL (ref 6.0–8.3)

## 2014-05-14 LAB — CBC
HEMATOCRIT: 42.8 % (ref 36.0–46.0)
HEMOGLOBIN: 14.5 g/dL (ref 12.0–15.0)
MCHC: 33.8 g/dL (ref 30.0–36.0)
MCV: 93.6 fl (ref 78.0–100.0)
Platelets: 243 10*3/uL (ref 150.0–400.0)
RBC: 4.57 Mil/uL (ref 3.87–5.11)
RDW: 13 % (ref 11.5–15.5)
WBC: 8.8 10*3/uL (ref 4.0–10.5)

## 2014-05-14 LAB — SEDIMENTATION RATE: Sed Rate: 8 mm/hr (ref 0–22)

## 2014-05-14 LAB — H. PYLORI ANTIBODY, IGG: H Pylori IgG: NEGATIVE

## 2014-05-14 MED ORDER — OMEPRAZOLE 40 MG PO CPDR: 40.0000 mg | DELAYED_RELEASE_CAPSULE | Freq: Every day | ORAL | Status: DC

## 2014-05-14 NOTE — Progress Notes (Signed)
Pre visit review using our clinic review tool, if applicable. No additional management support is needed unless otherwise documented below in the visit note. 

## 2014-05-14 NOTE — Progress Notes (Signed)
Subjective:     Rhonda Colon is a 39 y.o. female who presents for evaluation of abdominal pain. Onset was last night after eating large meal. Symptoms have been gradually improving. The pain is described as cramping, and is 3/10 in intensity. During night pain was 7-8/10. Pain is located in the epigastric region and periumbilical region without radiation.  Aggravating factors: none.  Alleviating factors: none. Associated symptoms: none. The patient denies anorexia, arthralagias, belching, chills, constipation, diarrhea, dysuria, fever, frequency, headache and nausea. She drank protein shake this morning with no exacerbation of symptoms. She tales omeprazole daily X 2 yrs. RF for gastritis: daily ASA. She recounts similar episode 1 mo ago, but with NVD & 1 episode bloody diarrhea. She has a hemorrhoid & thinks it had flared at time of diarrhea because she had been constipated for few days prior to episode. She thinks last episode was caused by "food poisoning" as friend was sick also. She has been seeing gyn over last month for recurrent bacterial vaginitis treated successfully with tindamax & clindamycin cream.  Due to Hx of recurrent rash r/t sun-exposure X 15 years, I will screen for SLE. She denies fever, arthralgia. Has Hx premature heart disease. No fam Hx contributory for autoimmune disease.  The patient's history has been marked as reviewed and updated as appropriate.  Review of Systems Constitutional: negative for fatigue and weight loss Ears, nose, mouth, throat, and face: negative for sore throat Respiratory: negative for cough Cardiovascular: negative for palpitations Gastrointestinal: negative for dyspepsia Genitourinary:negative for dysuria, frequency and hesitancy     Objective:    BP 136/86 mmHg  Pulse 60  Temp(Src) 98.5 F (36.9 C) (Oral)  Resp 16  Ht 5\' 4"  (1.626 m)  Wt 171 lb 12.8 oz (77.928 kg)  BMI 29.47 kg/m2  SpO2 97% General appearance: alert, cooperative,  appears stated age and no distress Head: Normocephalic, without obvious abnormality, atraumatic Eyes: negative findings: lids and lashes normal and conjunctivae and sclerae normal Lungs: clear to auscultation bilaterally Heart: regular rate and rhythm, S1, S2 normal, no murmur, click, rub or gallop Abdomen: soft, non-tender; bowel sounds normal; no masses,  no organomegaly    Assessment:Plan   1. Abdominal pain, periumbilical Recent recurrent BV, bloody diarrhea 1 mo ago, recurrent rash r/t sun exposure DD: gastritis, PUD, gall bladder disease, UTI, autoimmune disease - POCT urine pregnancy; Future - Urine culture; Future - POCT urinalysis dipstick; Future - Comprehensive metabolic panel; Future - CBC; Future - H. pylori antibody, IgG; Future - Antinuclear Antibodies, IFA; Future - Sedimentation rate; Future - Hemoglobin A1c; Future - omeprazole (PRILOSEC) 40 MG capsule; Take 1 capsule (40 mg total) by mouth daily.  Dispense: 30 capsule; Refill: 1 Increase omep to 40 mg qd Start probiotic Start EC ASA F/u 1 mo, or sooner PRN labs.

## 2014-05-14 NOTE — Patient Instructions (Signed)
I think pain is related to gastritis. Increase omeprazole to 40 mg daily-you may take 20 mg twice daily until you run out, then get new script for 40 mg. Consider starting probiotic-Align or Culterelle. Take 1 capsule daily for 3 months.  Start taking enteric coated aspirin.  My office will call with lab results and any additional immediate follow up plan.  Nice to meet you.

## 2014-05-15 ENCOUNTER — Other Ambulatory Visit: Payer: Self-pay | Admitting: Nurse Practitioner

## 2014-05-15 ENCOUNTER — Telehealth: Payer: Self-pay | Admitting: Nurse Practitioner

## 2014-05-15 DIAGNOSIS — N3000 Acute cystitis without hematuria: Secondary | ICD-10-CM

## 2014-05-15 DIAGNOSIS — N760 Acute vaginitis: Secondary | ICD-10-CM

## 2014-05-15 LAB — ANTINUCLEAR ANTIBODIES, IFA: ANA TITER 1: NEGATIVE

## 2014-05-15 LAB — URINE CULTURE: Colony Count: >=100000

## 2014-05-15 MED ORDER — PHENAZOPYRIDINE HCL 200 MG PO TABS: 200.0000 mg | ORAL_TABLET | Freq: Three times a day (TID) | ORAL | Status: DC | PRN

## 2014-05-15 MED ORDER — NITROFURANTOIN MONOHYD MACRO 100 MG PO CAPS: 100.0000 mg | ORAL_CAPSULE | Freq: Two times a day (BID) | ORAL | Status: DC

## 2014-05-15 MED ORDER — FLUCONAZOLE 150 MG PO TABS: 150.0000 mg | ORAL_TABLET | Freq: Once | ORAL | Status: DC

## 2014-05-15 NOTE — Telephone Encounter (Signed)
pls call pt: Advise she has UTI-may have been reason for abdominal pain. I called in antibiotic & pyridium-will relax bladder & help w/pain. Start today. All other labs nml-still waiting on 1 lab.  She does not have H pylori bacteria.  She should still increase omeprazole to 40 mg qd for 4 weeks. She should schedule f/u appt w/Dr Yetta BarreJones in 4 weeks.

## 2014-05-15 NOTE — Telephone Encounter (Signed)
Pt. Called and I gave her the message below from [place]. She said she would need a RX for diflucan (double dose) as she gets yeast infections from antibiotics/DH

## 2014-05-15 NOTE — Telephone Encounter (Signed)
LMOVM for pt to return call 

## 2014-05-16 ENCOUNTER — Telehealth: Payer: Self-pay | Admitting: Nurse Practitioner

## 2014-05-16 NOTE — Telephone Encounter (Signed)
Patient returned call and was given results. Patient has not started antibiotic yet (problem with pharmacy), however she is picking up rx this morning.

## 2014-05-16 NOTE — Telephone Encounter (Signed)
LMOVM for pt to return call 

## 2014-05-16 NOTE — Telephone Encounter (Signed)
Done

## 2014-05-16 NOTE — Telephone Encounter (Signed)
pls call pt: Advise Screening test for autoimmune disease is negative. Ask if she started abx for UTI.

## 2014-06-11 ENCOUNTER — Ambulatory Visit: Payer: BC Managed Care – PPO | Admitting: Cardiology

## 2014-06-14 ENCOUNTER — Ambulatory Visit (INDEPENDENT_AMBULATORY_CARE_PROVIDER_SITE_OTHER): Payer: 59 | Admitting: Cardiology

## 2014-06-14 ENCOUNTER — Encounter: Payer: Self-pay | Admitting: Cardiology

## 2014-06-14 VITALS — BP 110/60 | HR 53 | Ht 64.0 in | Wt 166.0 lb

## 2014-06-14 DIAGNOSIS — I2583 Coronary atherosclerosis due to lipid rich plaque: Principal | ICD-10-CM

## 2014-06-14 DIAGNOSIS — I2510 Atherosclerotic heart disease of native coronary artery without angina pectoris: Secondary | ICD-10-CM

## 2014-06-14 DIAGNOSIS — Z79899 Other long term (current) drug therapy: Secondary | ICD-10-CM

## 2014-06-14 LAB — HEPATIC FUNCTION PANEL
ALBUMIN: 4.3 g/dL (ref 3.5–5.2)
ALT: 18 U/L (ref 0–35)
AST: 21 U/L (ref 0–37)
Alkaline Phosphatase: 47 U/L (ref 39–117)
Bilirubin, Direct: 0.2 mg/dL (ref 0.0–0.3)
Indirect Bilirubin: 0.6 mg/dL (ref 0.2–1.2)
TOTAL PROTEIN: 6.8 g/dL (ref 6.0–8.3)
Total Bilirubin: 0.8 mg/dL (ref 0.2–1.2)

## 2014-06-14 LAB — LIPID PANEL
CHOLESTEROL: 149 mg/dL (ref 0–200)
HDL: 88 mg/dL (ref >=46)
LDL CALC: 52 mg/dL (ref 0–99)
Total CHOL/HDL Ratio: 1.7 Ratio
Triglycerides: 44 mg/dL (ref <150)
VLDL: 9 mg/dL (ref 0–40)

## 2014-06-14 NOTE — Progress Notes (Signed)
HPI The patient presents for followup after a previous MI . Since I last saw her she has done very well.  She continues to eat right, exercise routinely, and is not smoking cigarettes.   The patient denies any new symptoms such as chest discomfort, neck or arm discomfort. There has been no new shortness of breath, PND or orthopnea. There have been no reported palpitations, presyncope or syncope. She has had none of the symptoms that she had at the time of her previous MI.  She is upset about some weight gain.   No Known Allergies  Current Outpatient Prescriptions  Medication Sig Dispense Refill  . aspirin 81 MG tablet Take 81 mg by mouth daily.    [name] atorvastatin (LIPITOR) 40 MG tablet Take 1 tablet (40 mg total) by mouth at bedtime. 90 tablet 3  . Multiple Vitamin (MULTIVITAMIN) capsule Take 1 capsule by mouth daily.    . nitroGLYCERIN (NITROSTAT) 0.4 MG SL tablet Place 1 tablet (0.4 mg total) under the tongue every 5 (five) minutes as needed for chest pain (up to 3 doses). 25 tablet 4  . omeprazole (PRILOSEC) 40 MG capsule Take 1 capsule (40 mg total) by mouth daily. 30 capsule 1  . RA KRILL OIL 500 MG CAPS Take by mouth.     No current facility-administered medications for this visit.    Past Medical History  Diagnosis Date  . CAD (coronary artery disease)     a. 07/2011 NSTEMI/Cath: RCA 99%m, otw nl cors, EF 50%, inf hk -> RCA stented with 3.0x46mm Promus DES  . Hyperlipidemia   . Sinus bradycardia     a. Nocturnal, asymptomatic  . Morgagni hernia     a. Discovered incidentally on CT 07/2011 ->f/u with Dr. Lindie Spruce  . Leukocytosis     a. Noted 07/2011 (pt to f/u as OP to ensure resolution)  . Tobacco abuse     a. quit 07/2011    Past Surgical History  Procedure Laterality Date  . Tonsillectomy    . Cardiac catheterization      left heart, with angiogram  . Left heart catheterization with coronary angiogram N/A 08/10/2011    Procedure: LEFT HEART CATHETERIZATION WITH CORONARY  ANGIOGRAM;  Surgeon: Kathleene Hazel, MD;  Location: [facility] CATH LAB;  Service: Cardiovascular;  Laterality: N/A;    ROS:  As stated in the HPI and negative for all other systems.  PHYSICAL EXAM BP 110/60 mmHg  Pulse 53  Ht  (1.626 m)  Wt 166 lb (75.297 kg)  BMI 28.48 kg/m2 GENERAL:  Well appearing HEENT:  Pupils equal round and reactive, fundi not visualized, oral mucosa unremarkable NECK:  No jugular venous distention, waveform within normal limits, carotid upstroke brisk and symmetric, no bruits, no thyromegaly LYMPHATICS:  No cervical, inguinal adenopathy LUNGS:  Clear to auscultation bilaterally BACK:  No CVA tenderness CHEST:  Unremarkable HEART:  PMI not displaced or sustained,S1 and S2 within normal limits, no S3, no S4, no clicks, no rubs, no murmurs ABD:  Flat, positive bowel sounds normal in frequency in pitch, no bruits, no rebound, no guarding, no midline pulsatile mass, no hepatomegaly, no splenomegaly EXT:  2 plus pulses throughout, no edema, no cyanosis no clubbing  EKG:  Sinus rhythm, rate 53, axis within normal limits, intervals within normal limits, no acute ST-T wave changes.  06/14/2014  ASSESSMENT AND PLAN  CAD: The patient has no new sypmtoms.  No further cardiovascular testing is indicated.   HL:  She is at target and she will continue with current therapy.  I will check a lipid and liver today.   Tob Abuse: She continues to abstain. I a   Weight:  We had a long discussion about this and she is overly concerned about it and I emphasized and applied of the fact that she is remaining very fit.

## 2014-06-14 NOTE — Patient Instructions (Signed)
Your physician recommends that you schedule a follow-up appointment in: one year with Dr. Hochrein  Have your labs done today  

## 2014-06-15 ENCOUNTER — Encounter: Payer: Self-pay | Admitting: Cardiology

## 2014-07-16 ENCOUNTER — Encounter: Payer: Self-pay | Admitting: Internal Medicine

## 2014-07-16 ENCOUNTER — Ambulatory Visit (INDEPENDENT_AMBULATORY_CARE_PROVIDER_SITE_OTHER): Payer: 59 | Admitting: Internal Medicine

## 2014-07-16 VITALS — BP 130/80 | HR 56 | Temp 98.3°F | Resp 16 | Wt 169.0 lb

## 2014-07-16 DIAGNOSIS — L563 Solar urticaria: Secondary | ICD-10-CM | POA: Diagnosis not present

## 2014-07-16 MED ORDER — OMEPRAZOLE 20 MG PO CPDR: 20.0000 mg | DELAYED_RELEASE_CAPSULE | Freq: Every day | ORAL | Status: DC

## 2014-07-16 MED ORDER — METHYLPREDNISOLONE ACETATE 80 MG/ML IJ SUSP
120.0000 mg | Freq: Once | INTRAMUSCULAR | Status: AC
  Administered 2014-07-16: 120 mg via INTRAMUSCULAR

## 2014-07-16 MED ORDER — DOXEPIN HCL 10 MG PO CAPS: 10.0000 mg | ORAL_CAPSULE | Freq: Every day | ORAL | Status: DC

## 2014-07-16 NOTE — Progress Notes (Signed)
Subjective:    Patient ID: Rhonda Colon, female    DOB: 11/13/1975, 39 y.o.   MRN: [id number]  HPI Comments: She had a rash develop about 2-3 weeks ago, it started on her legs after some sun exposure but it has now spread to her torso and arms. The rash is pruritic but there is also some stinging. She has not treated it. It is made much worse by exercise and sweating over the last 1-2 weeks.  Rash This is a new problem. The current episode started 1 to 4 weeks ago. The problem is unchanged. The rash is diffuse. The rash is characterized by redness, burning and itchiness. She was exposed to nothing. Pertinent negatives include no anorexia, congestion, cough, diarrhea, eye pain, facial edema, fatigue, fever, joint pain, nail changes, rhinorrhea, shortness of breath, sore throat or vomiting. Past treatments include nothing. The treatment provided no relief.      Review of Systems  Constitutional: Negative.  Negative for fever, chills, diaphoresis, appetite change and fatigue.  HENT: Negative.  Negative for congestion, rhinorrhea, sinus pressure, sore throat and trouble swallowing.   Eyes: Negative.  Negative for pain.  Respiratory: Negative.  Negative for cough and shortness of breath.   Cardiovascular: Negative.  Negative for chest pain, palpitations and leg swelling.  Gastrointestinal: Negative.  Negative for vomiting, abdominal pain, diarrhea and anorexia.  Endocrine: Negative.   Genitourinary: Negative.  Negative for difficulty urinating.  Musculoskeletal: Negative.  Negative for myalgias, back pain, joint pain, joint swelling and arthralgias.  Skin: Positive for rash. Negative for nail changes.  Allergic/Immunologic: Negative.   Neurological: Negative.   Hematological: Negative.  Negative for adenopathy. Does not bruise/bleed easily.  Psychiatric/Behavioral: Negative.        Objective:   Physical Exam  Constitutional: She is oriented to person, place, and time. She appears  well-developed and well-nourished.  Non-toxic appearance. She does not have a sickly appearance. She does not appear ill. No distress.  HENT:  Head: Normocephalic and atraumatic.  Mouth/Throat: Oropharynx is clear and moist. No oropharyngeal exudate.  Eyes: Conjunctivae are normal. Right eye exhibits no discharge. Left eye exhibits no discharge. No scleral icterus.  Neck: Normal range of motion. Neck supple. No JVD present. No tracheal deviation present. No thyromegaly present.  Cardiovascular: Normal rate, regular rhythm, normal heart sounds and intact distal pulses.  Exam reveals no gallop and no friction rub.   No murmur heard. Pulmonary/Chest: Effort normal and breath sounds normal. No stridor. No respiratory distress. She has no wheezes. She has no rales. She exhibits no tenderness.  Abdominal: Soft. Bowel sounds are normal. She exhibits no distension and no mass. There is no tenderness. There is no rebound and no guarding.  Musculoskeletal: Normal range of motion. She exhibits no edema.  Lymphadenopathy:    She has no cervical adenopathy.  Neurological: She is oriented to person, place, and time.  Skin: Skin is warm and dry. Rash noted. No bruising, no ecchymosis and no purpura noted. Rash is macular, papular, maculopapular and urticarial. Rash is not nodular, not pustular and not vesicular. She is not diaphoretic. There is erythema. No cyanosis. No pallor. Nails show no clubbing.     Vitals reviewed.    Lab Results  Component Value Date   WBC 8.8 05/14/2014   HGB 14.5 05/14/2014   HCT 42.8 05/14/2014   PLT 243.0 05/14/2014   GLUCOSE 99 05/14/2014   CHOL 149 06/14/2014   TRIG 44 06/14/2014   HDL 88 06/14/2014  LDLCALC 52 06/14/2014   ALT 18 06/14/2014   AST 21 06/14/2014   NA 137 05/14/2014   K 4.0 05/14/2014   CL 102 05/14/2014   CREATININE 0.72 05/14/2014   BUN 14 05/14/2014   CO2 30 05/14/2014   TSH 1.276 08/09/2011   INR 0.96 08/10/2011   HGBA1C 5.3 08/10/2011        Assessment & Plan:

## 2014-07-16 NOTE — Patient Instructions (Signed)
Exercise-Induced Urticaria Urticaria is the medical term for the allergic reaction known commonly as hives that causes itchy and irritated skin. For certain individuals, urticaria may be caused by physical activity. Exercise-induced urticaria is more common in younger athletes. Urticaria typically begin as just an itchy skin rash; however, exercise-induced urticaria can progress to anaphylaxis. Anaphylaxis is marked by an increase in the hives, as well as airway constriction, a decrease in blood pressure, rapid pulse, or dizziness. Anaphylaxis is a serious medical condition and if left untreated can result in death. SYMPTOMS   Hives during or after exercise.  Flushing of the skin.  Itch.  Headache.  Nausea.  Swelling of the throat and upper airway.  Difficulty breathing.  Dizziness. CAUSES  For certain individuals, exercise may cause the body to elicit an immune response. Antibodies cross-link on the surface of mast cells. The mast cells release substances that create the symptoms of an allergic reaction, such as hives, itching, and the other symptoms that accompany this syndrome. The antigen release in this condition can be caused by:   Exercise.  Gradual heating of the body.  Water loss of the body. RISK INCREASES WITH:   Weather extremes (hot or cold).  Drug reactions.  Food stimuli.  Increased emotional response.  Menses. PREVENTION   Before symptoms occur (prophylactic) antihistamine use.  Avoid taking medications you are sensitive to prior to exercising.  Avoid eating shellfish or other foods you are sensitive to prior to exercising.  Avoid exercising in extreme temperatures, either cold or hot. TREATMENT  As soon as symptoms appear, stop exercising, cool off, and seek medical attention for an allergic reaction. Individuals who have been diagnosed with exercise-induced urticaria or anaphylaxis may be prescribed injectable epinephrine to treat symptoms. If you  have been prescribed medication, it is important that you know how to administer the injections. Symptoms can become severe quickly so prepare to seek emergency medical attention. It is advisable to wear a medical alert bracelet in case of collapse. It is also a good idea to exercise with a partner who is aware of your condition. If symptoms develop, cease exercise and find a cool place to rest. As mentioned previously, prophylactic antihistamines may not be beneficial in all cases.  Document Released: 03/16/2005 Document Revised: 07/31/2013 Document Reviewed: 06/28/2008 [facility] Patient Information 2015 [employer] [place]. This information is not intended to replace advice given to you by your health care provider. Make sure you discuss any questions you have with your health care provider.

## 2014-07-16 NOTE — Progress Notes (Signed)
Pre visit review using our clinic review tool, if applicable. No additional management support is needed unless otherwise documented below in the visit note. 

## 2014-07-17 ENCOUNTER — Encounter: Payer: Self-pay | Admitting: Internal Medicine

## 2014-07-17 NOTE — Assessment & Plan Note (Signed)
She has had a recurrence of solar urticaria but has also developed an exercise urticaria Will stop the cycle of inflammation with an injection of depo-medrol IM and [facility], will increase the doxepin dose over time it if needed

## 2014-07-23 ENCOUNTER — Other Ambulatory Visit: Payer: Self-pay | Admitting: Internal Medicine

## 2014-07-23 DIAGNOSIS — L563 Solar urticaria: Secondary | ICD-10-CM

## 2014-07-23 MED ORDER — MONTELUKAST SODIUM 10 MG PO TABS: 10.0000 mg | ORAL_TABLET | Freq: Every day | ORAL | Status: DC

## 2014-09-29 ENCOUNTER — Other Ambulatory Visit: Payer: Self-pay | Admitting: Internal Medicine

## 2014-12-25 ENCOUNTER — Other Ambulatory Visit: Payer: Self-pay

## 2014-12-25 MED ORDER — ATORVASTATIN CALCIUM 40 MG PO TABS: 40.0000 mg | ORAL_TABLET | Freq: Every day | ORAL | Status: DC

## 2014-12-25 NOTE — Telephone Encounter (Signed)
Rollene Rotunda, MD at 06/14/2014 8:35 AM  atorvastatin (LIPITOR) 40 MG tabletTake 1 tablet (40 mg total) by mouth at bedtime  Patient Instructions     Your physician recommends that you schedule a follow-up appointment in: one year with Dr. Antoine Poche   Notes Recorded by Rollene Rotunda, MD on 06/18/2014 at 11:04 AM Lipids are excellent. Continue current therapy. Call Ms. Safer with the results and send results to Sanda Linger, MD

## 2015-01-14 ENCOUNTER — Encounter: Payer: Self-pay | Admitting: Cardiology

## 2015-01-16 ENCOUNTER — Other Ambulatory Visit: Payer: Self-pay

## 2015-01-16 MED ORDER — ATORVASTATIN CALCIUM 40 MG PO TABS: 40.0000 mg | ORAL_TABLET | Freq: Every day | ORAL | Status: DC

## 2015-05-17 ENCOUNTER — Telehealth: Payer: Self-pay | Admitting: Internal Medicine

## 2015-05-17 NOTE — Telephone Encounter (Signed)
error 

## 2015-08-08 ENCOUNTER — Other Ambulatory Visit: Payer: Self-pay

## 2015-08-08 MED ORDER — ATORVASTATIN CALCIUM 40 MG PO TABS: 40.0000 mg | ORAL_TABLET | Freq: Every day | ORAL | Status: DC

## 2015-08-28 DIAGNOSIS — Z1389 Encounter for screening for other disorder: Secondary | ICD-10-CM | POA: Diagnosis not present

## 2015-08-28 DIAGNOSIS — N9411 Superficial (introital) dyspareunia: Secondary | ICD-10-CM | POA: Diagnosis not present

## 2015-08-28 DIAGNOSIS — Z130 Encounter for screening for diseases of the blood and blood-forming organs and certain disorders involving the immune mechanism: Secondary | ICD-10-CM | POA: Diagnosis not present

## 2015-08-28 DIAGNOSIS — Z01419 Encounter for gynecological examination (general) (routine) without abnormal findings: Secondary | ICD-10-CM | POA: Diagnosis not present

## 2015-08-28 DIAGNOSIS — Z1231 Encounter for screening mammogram for malignant neoplasm of breast: Secondary | ICD-10-CM | POA: Diagnosis not present

## 2015-08-28 DIAGNOSIS — Z6833 Body mass index (BMI) 33.0-33.9, adult: Secondary | ICD-10-CM | POA: Diagnosis not present

## 2015-10-03 ENCOUNTER — Telehealth: Payer: Self-pay

## 2015-10-03 ENCOUNTER — Ambulatory Visit (INDEPENDENT_AMBULATORY_CARE_PROVIDER_SITE_OTHER): Payer: BLUE CROSS/BLUE SHIELD | Admitting: Internal Medicine

## 2015-10-03 ENCOUNTER — Encounter: Payer: Self-pay | Admitting: Internal Medicine

## 2015-10-03 VITALS — BP 120/72 | HR 77 | Temp 98.0°F | Resp 20 | Wt 201.0 lb

## 2015-10-03 DIAGNOSIS — L563 Solar urticaria: Secondary | ICD-10-CM

## 2015-10-03 MED ORDER — METHYLPREDNISOLONE ACETATE 40 MG/ML IJ SUSP
40.0000 mg | Freq: Once | INTRAMUSCULAR | Status: AC
  Administered 2015-10-03: 40 mg via INTRAMUSCULAR

## 2015-10-03 MED ORDER — TRIAMCINOLONE ACETONIDE 0.1 % EX CREA: 1.0000 "application " | TOPICAL_CREAM | Freq: Two times a day (BID) | CUTANEOUS | Status: DC

## 2015-10-03 MED ORDER — PREDNISONE 10 MG PO TABS: ORAL_TABLET | ORAL | Status: DC

## 2015-10-03 NOTE — Progress Notes (Signed)
Pre visit review using our clinic review tool, if applicable. No additional management support is needed unless otherwise documented below in the visit note. 

## 2015-10-03 NOTE — Telephone Encounter (Signed)
Medications sent to new pharmacy 

## 2015-10-03 NOTE — Progress Notes (Signed)
Subjective:    Patient ID: Rhonda Colon, female    DOB: 06/16/1975, 40 y.o.   MRN: [id number]  HPI  Here to f/u with rash to [place]; has hx of solar urticaria and she was covered with hat and clothing including feet, but not arms or most of legs.  Has been dx with solar urticaria, seen per dermatology and states she was given doxepin but could not take more than 2 wks due to side effect.  Did have a steroid shot per PCP at last rash incidence, did seem to help.  Overall rash not helped with OTC creams  No fever, advil has helped with discomfort Past Medical History  Diagnosis Date  . CAD (coronary artery disease)     a. 07/2011 NSTEMI/Cath: RCA 99%m, otw nl cors, EF 50%, inf hk -> RCA stented with 3.0x4016mm Promus DES  . Hyperlipidemia   . Sinus bradycardia     a. Nocturnal, asymptomatic  . Morgagni hernia     a. Discovered incidentally on CT 07/2011 ->f/u with Dr. Lindie SpruceWyatt  . Leukocytosis     a. Noted 07/2011 (pt to f/u as OP to ensure resolution)  . Tobacco abuse     a. quit 07/2011   Past Surgical History  Procedure Laterality Date  . Tonsillectomy    . Cardiac catheterization      left heart, with angiogram  . Left heart catheterization with coronary angiogram N/A 08/10/2011    Procedure: LEFT HEART CATHETERIZATION WITH CORONARY ANGIOGRAM;  Surgeon: Kathleene Hazelhristopher D McAlhany, MD;  [facility];  Service: Cardiovascular;  Laterality: N/A;    reports that she quit smoking about 4 years ago. Her smoking use included Cigarettes. She has a 18 pack-year smoking history. She has never used smokeless tobacco. She reports that she does not drink alcohol or use illicit drugs. family history includes Coronary artery disease (age of onset: 5446) in her maternal uncle; Coronary artery disease (age of onset: 2052) in her mother; Coronary artery disease (age of onset: 8061) in her maternal aunt; Heart attack in her maternal aunt, maternal uncle,  and mother; Heart disease in her mother. There is no history of Cancer, Early death, Hypertension, Kidney disease, Stroke, Alcohol abuse, Diabetes, or Drug abuse. No Known Allergies Current Outpatient Prescriptions on File Prior to Visit  Medication Sig Dispense Refill  . aspirin 81 MG tablet Take 81 mg by mouth daily.    [name]. atorvastatin (LIPITOR) 40 MG tablet Take 1 tablet (40 mg total) by mouth at bedtime. 60 tablet 1  . cetirizine (ZYRTEC) 10 MG tablet TAKE ONE TABLET BY MOUTH ONCE DAILY 30 tablet 11  . Multiple Vitamin (MULTIVITAMIN) capsule Take 1 capsule by mouth daily.    . nitroGLYCERIN (NITROSTAT) 0.4 MG SL tablet Place 1 tablet (0.4 mg total) under the tongue every 5 (five) minutes as needed for chest pain (up to 3 doses). 25 tablet 4  . omeprazole (PRILOSEC) 20 MG capsule Take 1 capsule (20 mg total) by mouth daily. 90 capsule 3  . RA KRILL OIL 500 MG CAPS Take by mouth.     No current facility-administered medications on file prior to visit.   Review of Systems All otherwise neg per pt     Objective:   Physical Exam BP 120/72 mmHg  Pulse 77  Temp(Src) 98 F (36.7 C) (Oral)  Resp 20  Wt 201 lb (91.173 kg)  SpO2 97% VS noted,  Constitutional: Pt appears in no apparent distress HENT: Head: NCAT.  Right Ear: External ear normal.  Left Ear: External ear normal.  Eyes: . Pupils are equal, round, and reactive to light. Conjunctivae and EOM are normal Neck: Normal range of motion. Neck supple.  Cardiovascular: Normal rate and regular rhythm.   Pulmonary/Chest: Effort normal and breath sounds without rales or wheezing.  Neurological: Pt is alert. Not confused , motor grossly intact Skin: Skin is warm.  no LE edema with duffuse swelling/erythema with numerous small raised areas to sun exposed arms and legs bilat, no weepiness, ulcers or drainage Psychiatric: Pt behavior is normal. No agitation.     Assessment & Plan:

## 2015-10-03 NOTE — Assessment & Plan Note (Signed)
With recurrence after sun exposure despite precautions, for depomedrol IM, predpac asd, topical triam cr prn worst areas,  to f/u any worsening symptoms or concerns

## 2015-10-03 NOTE — Patient Instructions (Signed)
You had the steroid shot today  Please take all new medication as prescribed- the prednisone, and steroid cream for the worst areas  You can also take calamine lotion OTC, as well as Advil OTC for pain if needed  Please continue all other medications as before, and refills have been done if requested.  Please have the pharmacy call with any other refills you may need.  Please keep your appointments with your specialists as you may have planned

## 2015-10-28 ENCOUNTER — Encounter: Payer: BLUE CROSS/BLUE SHIELD | Admitting: Internal Medicine

## 2015-11-25 ENCOUNTER — Other Ambulatory Visit: Payer: Self-pay | Admitting: Cardiology

## 2015-12-04 ENCOUNTER — Other Ambulatory Visit: Payer: Self-pay | Admitting: Cardiology

## 2015-12-06 ENCOUNTER — Encounter: Payer: Self-pay | Admitting: Cardiology

## 2015-12-06 ENCOUNTER — Ambulatory Visit: Payer: BLUE CROSS/BLUE SHIELD | Admitting: Cardiology

## 2015-12-08 NOTE — Progress Notes (Signed)
HPI The patient presents for followup after a previous MI . Since I last saw her she has done OK from a cardiac standpoint.  However, she is very tearful .  She has gained back much of the weight that she lost at one point.  She is not exercising.  If she does some walking she denies acute symptoms.  The patient denies any new symptoms such as chest discomfort, neck or arm discomfort. There has been no new shortness of breath, PND or orthopnea. There have been no reported palpitations, presyncope or syncope.  No Known Allergies  Current Outpatient Prescriptions  Medication Sig Dispense Refill  . aspirin 81 MG tablet Take 81 mg by mouth daily.    [name] atorvastatin (LIPITOR) 40 MG tablet TAKE 1 TABLET (40 MG TOTAL) BY MOUTH AT BEDTIME (NEED OFFICE VISIT FOR FURTHER REFILLS) 120 tablet 0  . cetirizine (ZYRTEC) 10 MG tablet TAKE ONE TABLET BY MOUTH ONCE DAILY 30 tablet 11  . Multiple Vitamin (MULTIVITAMIN) capsule Take 1 capsule by mouth daily.    . nitroGLYCERIN (NITROSTAT) 0.4 MG SL tablet Place 1 tablet (0.4 mg total) under the tongue every 5 (five) minutes as needed for chest pain (up to 3 doses). 25 tablet 4  . omeprazole (PRILOSEC) 20 MG capsule Take 1 capsule (20 mg total) by mouth daily. 90 capsule 3  . RA KRILL OIL 500 MG CAPS Take by mouth.     No current facility-administered medications for this visit.     Past Medical History:  Diagnosis Date  . CAD (coronary artery disease)    a. 07/2011 NSTEMI/Cath: RCA 99%m, otw nl cors, EF 50%, inf hk -> RCA stented with 3.0x1mm Promus DES  . Hyperlipidemia   . Leukocytosis    a. Noted 07/2011 (pt to f/u as OP to ensure resolution)  . Morgagni hernia    a. Discovered incidentally on CT 07/2011 ->f/u with Dr. Lindie Spruce  . Sinus bradycardia    a. Nocturnal, asymptomatic  . Tobacco abuse    a. quit 07/2011    Past Surgical History:  Procedure Laterality Date  . CARDIAC CATHETERIZATION     left heart, with angiogram  . LEFT HEART  CATHETERIZATION WITH CORONARY ANGIOGRAM N/A 08/10/2011   Procedure: LEFT HEART CATHETERIZATION WITH CORONARY ANGIOGRAM;  Surgeon: Kathleene Hazel, MD;  Location: [facility] CATH LAB;  Service: Cardiovascular;  Laterality: N/A;  . TONSILLECTOMY      ROS:   As stated in the HPI and negative for all other systems.  PHYSICAL EXAM BP 132/86   Pulse 70   Ht 5\' 4"  (1.626 m)   Wt 215 lb (97.5 kg)   BMI 36.90 kg/m  GENERAL:  Well appearing HEENT:  Pupils equal round and reactive, fundi not visualized, oral mucosa unremarkable NECK:  No jugular venous distention, waveform within normal limits, carotid upstroke brisk and symmetric, no bruits, no thyromegaly LUNGS:  Clear to auscultation bilaterally CHEST:  Unremarkable HEART:  PMI not displaced or sustained,S1 and S2 within normal limits, no S3, no S4, no clicks, no rubs, no murmurs ABD:  Flat, positive bowel sounds normal in frequency in pitch, no bruits, no rebound, no guarding, no midline pulsatile mass, no hepatomegaly, no splenomegaly EXT:  2 plus pulses throughout, no edema, no cyanosis no clubbing  EKG:  Sinus rhythm, rate 70, axis within normal limits, intervals within normal limits, no acute ST-T wave changes.  12/09/2015   Lab Results  Component Value Date   CHOL 149 06/14/2014  TRIG 44 06/14/2014   HDL 88 06/14/2014   LDLCALC 52 06/14/2014    ASSESSMENT AND PLAN  CAD: The patient has no new sypmtoms.  No further cardiovascular testing is indicated.   HL:   She will get a lipid and liver test with Sanda Lingerhomas Jones, MD and I would like to review these.  The LDL needs to be in the 70s  Tob Abuse: She continues to abstain.    Weight:  We had a long discussion about this and she is overly concerned about it.  Depression:  I have asked her to get a new patient appt with a psychiatrist and to discuss this with Sanda Lingerhomas Jones, MD.

## 2015-12-09 ENCOUNTER — Encounter: Payer: Self-pay | Admitting: Cardiology

## 2015-12-09 ENCOUNTER — Ambulatory Visit (INDEPENDENT_AMBULATORY_CARE_PROVIDER_SITE_OTHER): Payer: BLUE CROSS/BLUE SHIELD | Admitting: Cardiology

## 2015-12-09 VITALS — BP 132/86 | HR 70 | Ht 64.0 in | Wt 215.0 lb

## 2015-12-09 DIAGNOSIS — I2583 Coronary atherosclerosis due to lipid rich plaque: Secondary | ICD-10-CM

## 2015-12-09 DIAGNOSIS — I2510 Atherosclerotic heart disease of native coronary artery without angina pectoris: Secondary | ICD-10-CM

## 2015-12-09 NOTE — Patient Instructions (Signed)
Medication Instructions:  Continue your current medications  Labwork: None Ordered  Testing/Procedures: None ordered  Follow-Up: Your physician wants you to follow-up in: 1 Year. You will receive a reminder letter in the mail two months in advance. If you don't receive a letter, please call our office to schedule the follow-up appointment.   Any Other Special Instructions Will Be Listed Below (If Applicable).   If you need a refill on your cardiac medications before your next appointment, please call your pharmacy.

## 2015-12-10 ENCOUNTER — Other Ambulatory Visit: Payer: Self-pay | Admitting: *Deleted

## 2015-12-10 ENCOUNTER — Encounter: Payer: Self-pay | Admitting: Internal Medicine

## 2015-12-10 ENCOUNTER — Encounter: Payer: Self-pay | Admitting: Cardiology

## 2015-12-10 ENCOUNTER — Other Ambulatory Visit: Payer: Self-pay | Admitting: Internal Medicine

## 2015-12-10 DIAGNOSIS — F329 Major depressive disorder, single episode, unspecified: Secondary | ICD-10-CM

## 2015-12-10 DIAGNOSIS — F32A Depression, unspecified: Secondary | ICD-10-CM

## 2015-12-10 DIAGNOSIS — F418 Other specified anxiety disorders: Secondary | ICD-10-CM | POA: Insufficient documentation

## 2015-12-10 MED ORDER — NITROGLYCERIN 0.4 MG SL SUBL: 0.4000 mg | SUBLINGUAL_TABLET | SUBLINGUAL | 1 refills | Status: DC | PRN

## 2015-12-10 MED ORDER — ATORVASTATIN CALCIUM 40 MG PO TABS: ORAL_TABLET | ORAL | 3 refills | Status: DC

## 2016-05-22 ENCOUNTER — Other Ambulatory Visit: Payer: Self-pay | Admitting: Cardiology

## 2016-05-22 NOTE — Telephone Encounter (Signed)
Rx(s) sent to pharmacy electronically.  

## 2017-01-18 ENCOUNTER — Other Ambulatory Visit: Payer: Self-pay | Admitting: Cardiology

## 2017-01-18 NOTE — Telephone Encounter (Signed)
REFILL 

## 2017-04-26 ENCOUNTER — Other Ambulatory Visit: Payer: Self-pay | Admitting: Cardiology

## 2017-04-26 NOTE — Telephone Encounter (Signed)
Rx request sent to pharmacy.  

## 2017-05-21 ENCOUNTER — Telehealth: Payer: Self-pay | Admitting: Internal Medicine

## 2017-05-21 ENCOUNTER — Ambulatory Visit: Payer: BLUE CROSS/BLUE SHIELD | Admitting: Family

## 2017-05-21 ENCOUNTER — Encounter: Payer: Self-pay | Admitting: Family

## 2017-05-21 VITALS — BP 132/90 | HR 98 | Temp 98.5°F | Ht 64.0 in | Wt 304.0 lb

## 2017-05-21 DIAGNOSIS — H6690 Otitis media, unspecified, unspecified ear: Secondary | ICD-10-CM | POA: Diagnosis not present

## 2017-05-21 DIAGNOSIS — I2510 Atherosclerotic heart disease of native coronary artery without angina pectoris: Secondary | ICD-10-CM | POA: Diagnosis not present

## 2017-05-21 DIAGNOSIS — E669 Obesity, unspecified: Secondary | ICD-10-CM

## 2017-05-21 MED ORDER — FLUTICASONE PROPIONATE 50 MCG/ACT NA SUSP: 2.0000 | Freq: Every day | NASAL | 6 refills | Status: DC

## 2017-05-21 MED ORDER — BENZONATATE 100 MG PO CAPS: 100.0000 mg | ORAL_CAPSULE | Freq: Three times a day (TID) | ORAL | 0 refills | Status: DC | PRN

## 2017-05-21 MED ORDER — CETIRIZINE HCL 10 MG PO TABS: 10.0000 mg | ORAL_TABLET | Freq: Every day | ORAL | 11 refills | Status: DC

## 2017-05-21 MED ORDER — ATORVASTATIN CALCIUM 40 MG PO TABS: 40.0000 mg | ORAL_TABLET | Freq: Every day | ORAL | 1 refills | Status: DC

## 2017-05-21 MED ORDER — NITROGLYCERIN 0.4 MG SL SUBL: 0.4000 mg | SUBLINGUAL_TABLET | SUBLINGUAL | 0 refills | Status: DC | PRN

## 2017-05-21 MED ORDER — CEFDINIR 300 MG PO CAPS: 300.0000 mg | ORAL_CAPSULE | Freq: Two times a day (BID) | ORAL | 0 refills | Status: DC

## 2017-05-21 NOTE — Progress Notes (Signed)
Rhonda Colon is a 42 y.o. female with the following history as recorded in Epic[employer]:  Patient Active Problem List   Diagnosis Date Noted  . Depression 12/10/2015  . Urticaria, solar 08/08/2013  . CAD (coronary artery disease) 08/28/2011    Current Outpatient Medications  Medication Sig Dispense Refill  . aspirin 81 MG tablet Take 81 mg by mouth daily.    [name]. atorvastatin (LIPITOR) 40 MG tablet Take 1 tablet (40 mg total) by mouth daily at 6 PM. Please schedule appointment for refills. 30 tablet 1  . cetirizine (ZYRTEC) 10 MG tablet Take 1 tablet (10 mg total) by mouth daily. 30 tablet 11  . Multiple Vitamin (MULTIVITAMIN) capsule Take 1 capsule by mouth daily.    . nitroGLYCERIN (NITROSTAT) 0.4 MG SL tablet Place 1 tablet (0.4 mg total) under the tongue every 5 (five) minutes as needed for chest pain (up to 3 doses). 25 tablet 0  . omeprazole (PRILOSEC) 20 MG capsule Take 1 capsule (20 mg total) by mouth daily. 90 capsule 3  . RA KRILL OIL 500 MG CAPS Take by mouth.    . sertraline (ZOLOFT) 50 MG tablet Take 50 mg by mouth daily.    . benzonatate (TESSALON) 100 MG capsule Take 1 capsule (100 mg total) by mouth 3 (three) times daily as needed for cough. 20 capsule 0  . cefdinir (OMNICEF) 300 MG capsule Take 1 capsule (300 mg total) by mouth 2 (two) times daily. 20 capsule 0  . fluticasone (FLONASE) 50 MCG/ACT nasal spray Place 2 sprays into both nostrils daily. 16 g 6   No current facility-administered medications for this visit.     Allergies: Patient has no known allergies.  Past Medical History:  Diagnosis Date  . CAD (coronary artery disease)    a. 07/2011 NSTEMI/Cath: RCA 99%m, otw nl cors, EF 50%, inf hk -> RCA stented with 3.0x5216mm Promus DES  . Hyperlipidemia   . Leukocytosis    a. Noted 07/2011 (pt to f/u as OP to ensure resolution)  . Morgagni hernia    a. Discovered incidentally on CT 07/2011 ->f/u with Dr. Lindie SpruceWyatt  . Sinus bradycardia    a. Nocturnal, asymptomatic  .  Tobacco abuse    a. quit 07/2011    Past Surgical History:  Procedure Laterality Date  . CARDIAC CATHETERIZATION     left heart, with angiogram  . LEFT HEART CATHETERIZATION WITH CORONARY ANGIOGRAM N/A 08/10/2011   Procedure: LEFT HEART CATHETERIZATION WITH CORONARY ANGIOGRAM;  Surgeon: Kathleene Hazelhristopher D McAlhany, MD;  [facility];  Service: Cardiovascular;  Laterality: N/A;  . TONSILLECTOMY      Family History  Problem Relation Age of Onset  . Coronary artery disease Mother 1252        stent  . Heart attack Mother   . Heart disease Mother   . Coronary artery disease Maternal Aunt [id number]       (Mother's twin sister)  . Coronary artery disease Maternal Uncle 46  . Heart attack Maternal Aunt        mother's twin  . Heart attack Maternal Uncle   . Cancer Neg Hx   . Early death Neg Hx   . Hypertension Neg Hx   . Kidney disease Neg Hx   . Stroke Neg Hx   . Alcohol abuse Neg Hx   . Diabetes Neg Hx   . Drug abuse Neg Hx     Social History   Tobacco Use  . Smoking status: Former  Smoker    Packs/day: 1.00    Years: 18.00    Pack years: 18.00    Types: Cigarettes    Last attempt to quit: 08/09/2011    Years since quitting: 5.7  . Smokeless tobacco: Never Used  . Tobacco comment: 2013  Substance Use Topics  . Alcohol use: No    Comment: occassional    Subjective:  Started with flu- like symptoms yesterday; woke up coughing/ congested/ body aches; + chest hurts to cough; using OTC Tylenol for symptoms;  At age 66, had MI; admits that she has not been taking care of herself in the past year and wants to "get back on track." Feels like she needs new PCP- thinks she would do better with a female PCP; is planning to get back to her cardiologist; would also like information for weight loss options/ specialists- does not want weight loss surgery.   Objective:  Vitals:   05/21/17 1430  BP: 132/90  Pulse: 98  Temp: 98.5 F (36.9 C)  TempSrc: Oral  SpO2: 98%  Weight: (!) 304 lb  (137.9 kg)  Height: 5\' 4"  (1.626 m)    General: Well developed, well nourished, in no acute distress  Skin : Warm and dry.  Head: Normocephalic and atraumatic  Eyes: Sclera and conjunctiva clear; pupils round and reactive to light; extraocular movements intact  Ears: External normal; canals clear; tympanic membranes erythematous/ right mildly bulgin Oropharynx: Pink, supple. No suspicious lesions  Neck: Supple without thyromegaly, adenopathy  Lungs: Respirations unlabored; clear to auscultation bilaterally without wheeze, rales, rhonchi  CVS exam: normal rate and regular rhythm.  Neurologic: Alert and oriented; speech intact; face symmetrical; moves all extremities well; CNII-XII intact without focal deficit   Assessment:  1. Acute otitis media, unspecified otitis media type   2. Obesity with serious comorbidity, unspecified classification, unspecified obesity type   3. Coronary artery disease involving native coronary artery of native heart without angina pectoris     Plan:  1. Rx for Omnicef 300 mg bid x 10 days; Rx for Zyrtec and Flonase; increase fluids, rest and follow-up worse, no better; 2. Refer to Dr. Quillian Quince to start medical weight loss program;  3. Patient is planning to schedule follow-up with her cardiologist; encouraged to make appointment as she has not seen him in about 1 year;  Names given for female PCP options at [facility] or [facility]; she will schedule her appointment in follow-up.  No Follow-up on file.  Orders Placed This Encounter  Procedures  . Amb Ref to Medical Weight Management    Referral Priority:   Routine    Referral Type:   Consultation    Number of Visits Requested:   1    Requested Prescriptions   Signed Prescriptions Disp Refills  . cetirizine (ZYRTEC) 10 MG tablet 30 tablet 11    Sig: Take 1 tablet (10 mg total) by mouth daily.  . nitroGLYCERIN (NITROSTAT) 0.4 MG SL tablet 25 tablet 0    Sig: Place 1 tablet (0.4 mg total)  under the tongue every 5 (five) minutes as needed for chest pain (up to 3 doses).  [name] atorvastatin (LIPITOR) 40 MG tablet 30 tablet 1    Sig: Take 1 tablet (40 mg total) by mouth daily at 6 PM. Please schedule appointment for refills.  . cefdinir (OMNICEF) 300 MG capsule 20 capsule 0    Sig: Take 1 capsule (300 mg total) by mouth 2 (two) times daily.  . fluticasone (  FLONASE) 50 MCG/ACT nasal spray 16 g 6    Sig: Place 2 sprays into both nostrils daily.  . benzonatate (TESSALON) 100 MG capsule 20 capsule 0    Sig: Take 1 capsule (100 mg total) by mouth 3 (three) times daily as needed for cough.

## 2017-05-21 NOTE — Patient Instructions (Signed)
Rhonda CrazeMelissa Colon ( [facility]) Rhonda Colon ( [facility])

## 2017-05-21 NOTE — Telephone Encounter (Signed)
Patient is requesting transfer from [facility].  Please advise.

## 2017-05-21 NOTE — Telephone Encounter (Signed)
Ok with me 

## 2017-05-24 ENCOUNTER — Other Ambulatory Visit: Payer: Self-pay | Admitting: Family

## 2017-05-24 MED ORDER — SERTRALINE HCL 50 MG PO TABS: 50.0000 mg | ORAL_TABLET | Freq: Every day | ORAL | 3 refills | Status: DC

## 2017-05-24 MED ORDER — FLUCONAZOLE 150 MG PO TABS: 150.0000 mg | ORAL_TABLET | Freq: Once | ORAL | 0 refills | Status: AC

## 2017-05-24 NOTE — Telephone Encounter (Signed)
Called pt and LVM informing her of the providers decision. Advised pt to call back to schedule a new patient appt.

## 2017-05-24 NOTE — Telephone Encounter (Signed)
Yes, ok with me 

## 2017-05-24 NOTE — Telephone Encounter (Signed)
Selena BattenKim, Can you please call patient to schedule transfer?   Thanks!

## 2017-06-02 ENCOUNTER — Encounter: Payer: Self-pay | Admitting: Family

## 2017-06-03 NOTE — Telephone Encounter (Signed)
Please advise. Thanks.  

## 2017-06-03 NOTE — Telephone Encounter (Signed)
Not overly concerned about some lingering coughing- this should resolve on its own over the next week or so; can she clarify about the ear however- still very painful or just feels full?

## 2017-06-04 ENCOUNTER — Other Ambulatory Visit: Payer: Self-pay | Admitting: Family

## 2017-06-04 MED ORDER — LEVOFLOXACIN 500 MG PO TABS: 500.0000 mg | ORAL_TABLET | Freq: Every day | ORAL | 0 refills | Status: DC

## 2017-06-04 NOTE — Telephone Encounter (Signed)
Please advise. Patient still having constant discomfort with her ear.

## 2017-06-28 ENCOUNTER — Ambulatory Visit: Payer: BLUE CROSS/BLUE SHIELD | Admitting: Cardiology

## 2017-07-23 ENCOUNTER — Other Ambulatory Visit: Payer: Self-pay | Admitting: Internal Medicine

## 2017-07-23 DIAGNOSIS — Z0000 Encounter for general adult medical examination without abnormal findings: Secondary | ICD-10-CM

## 2017-07-23 DIAGNOSIS — Z1321 Encounter for screening for nutritional disorder: Secondary | ICD-10-CM

## 2017-07-23 DIAGNOSIS — Z1329 Encounter for screening for other suspected endocrine disorder: Secondary | ICD-10-CM

## 2017-07-23 DIAGNOSIS — Z13220 Encounter for screening for lipoid disorders: Secondary | ICD-10-CM

## 2017-07-29 ENCOUNTER — Other Ambulatory Visit: Payer: Self-pay | Admitting: Internal Medicine

## 2017-08-02 ENCOUNTER — Other Ambulatory Visit (HOSPITAL_COMMUNITY)
Admission: RE | Admit: 2017-08-02 | Discharge: 2017-08-02 | Disposition: A | Discharge status: Home / Self Care | Payer: BLUE CROSS/BLUE SHIELD | Source: Ambulatory Visit | Attending: Internal Medicine | Admitting: Internal Medicine

## 2017-08-02 ENCOUNTER — Encounter: Payer: Self-pay | Admitting: Internal Medicine

## 2017-08-02 ENCOUNTER — Ambulatory Visit: Payer: BLUE CROSS/BLUE SHIELD | Admitting: Internal Medicine

## 2017-08-02 VITALS — BP 160/110 | HR 113 | Ht 68.0 in | Wt 307.0 lb

## 2017-08-02 DIAGNOSIS — L739 Follicular disorder, unspecified: Secondary | ICD-10-CM

## 2017-08-02 DIAGNOSIS — Z87891 Personal history of nicotine dependence: Secondary | ICD-10-CM | POA: Diagnosis not present

## 2017-08-02 DIAGNOSIS — Z0000 Encounter for general adult medical examination without abnormal findings: Secondary | ICD-10-CM

## 2017-08-02 DIAGNOSIS — F32A Depression, unspecified: Secondary | ICD-10-CM

## 2017-08-02 DIAGNOSIS — I252 Old myocardial infarction: Secondary | ICD-10-CM | POA: Diagnosis not present

## 2017-08-02 DIAGNOSIS — Z124 Encounter for screening for malignant neoplasm of cervix: Secondary | ICD-10-CM

## 2017-08-02 DIAGNOSIS — E785 Hyperlipidemia, unspecified: Secondary | ICD-10-CM | POA: Diagnosis not present

## 2017-08-02 DIAGNOSIS — Z23 Encounter for immunization: Secondary | ICD-10-CM | POA: Diagnosis not present

## 2017-08-02 DIAGNOSIS — F329 Major depressive disorder, single episode, unspecified: Secondary | ICD-10-CM

## 2017-08-02 LAB — POCT URINALYSIS DIPSTICK
Appearance: NORMAL
BILIRUBIN UA: NEGATIVE
Blood, UA: NEGATIVE
Glucose, UA: NEGATIVE
Ketones, UA: NEGATIVE
LEUKOCYTES UA: NEGATIVE
Nitrite, UA: NEGATIVE
Odor: NORMAL
Protein, UA: NEGATIVE
SPEC GRAV UA: 1.02 (ref 1.010–1.025)
Urobilinogen, UA: 0.2 E.U./dL
pH, UA: 6.5 (ref 5.0–8.0)

## 2017-08-03 ENCOUNTER — Encounter: Payer: Self-pay | Admitting: Internal Medicine

## 2017-08-03 ENCOUNTER — Other Ambulatory Visit: Payer: Self-pay | Admitting: Internal Medicine

## 2017-08-03 MED ORDER — ATORVASTATIN CALCIUM 40 MG PO TABS: 40.0000 mg | ORAL_TABLET | Freq: Every day | ORAL | 3 refills | Status: DC

## 2017-08-03 MED ORDER — TRIAMCINOLONE ACETONIDE 0.1 % EX CREA: 1.0000 "application " | TOPICAL_CREAM | Freq: Two times a day (BID) | CUTANEOUS | 0 refills | Status: DC

## 2017-08-03 MED ORDER — OMEPRAZOLE 20 MG PO CPDR: 20.0000 mg | DELAYED_RELEASE_CAPSULE | Freq: Every day | ORAL | 0 refills | Status: DC

## 2017-08-03 MED ORDER — OMEPRAZOLE 20 MG PO CPDR: 20.0000 mg | DELAYED_RELEASE_CAPSULE | Freq: Every day | ORAL | 3 refills | Status: DC

## 2017-08-03 NOTE — Progress Notes (Signed)
   Subjective:    Patient ID: Rhonda Colon, female    DOB: 06-27-1975, 42 y.o.   MRN: [id number]  HPI 42 year old Female presents to the office for the first time today.  Patient was referred by De[facility].  Pearlean Brownie was her aunt who passed away recently.  Patient had a non-STEMI MI 2013 with 90% RCA.  RCA was stented with a drug-eluting stent.  History of hyperlipidemia.  History of more gagging hernia discovered incidentally on CT in 2013 and followed up with Dr. Lindie Spruce.  Quit smoking in 2013.  History of tonsillectomy.  History of anxiety depression treated with Zoloft.  History of GE reflux treated with Prilosec.  Social history: She is employed as a Veterinary surgeon at [employer].  Social alcohol consumption.  She is single.  Family history: Both parents with history of MI.  Additional past medical history: History of fractured wrist 2002, fracture spine 1992, fractured foot 1988.    Says she lost 200 pounds after her MI by working out but has regained the weight.  This is depressing for her.   History of solar urticaria  She takes 81 mg of aspirin daily and is on generic Lipitor 40 mg daily as well as krill oil.  Takes Zyrtec for allergic rhinitis as well as Zoloft for depression.  Had tetanus immunization 2016.  Dr. Jackelyn Knife is GYN physician.    Review of Systems has recently developed dermatitis on her legs     Objective:   Physical Exam Skin warm and dry.  Has dermatitis of the legs it looks like folliculitis.  Nodes none.  TMs and pharynx are clear.  Neck is supple.  Chest clear to auscultation.  Cardiac exam regular rate and rhythm.  Breasts normal female.  Abdomen obese nondistended no hepatosplenomegaly masses or tenderness.  Extremities without pitting edema.  Neurological exam is intact without focal deficits.  Pelvic exam: Normal female external genitalia.  Pap taken.  No masses.  Mood and affect are normal.      Assessment & Plan:  Weight gain  after MI  History of coronary artery disease with 99% RCA stented with drug-eluting stent  Hyperlipidemia treated with statin  History of tobacco abuse but quit smoking after MI  History of solar urticaria  Folliculitis lower extremities  Family history of heart disease in both parents and in her mother's twin sister  Plan: I think she is a good candidate for the obesity clinic at [facility].  She was given information about the obesity clinic.  Her hemoglobin A1c is normal.  TSH is normal.  Lipid panel is normal.  For  folliculitis have prescribed triamcinolone cream twice daily sparingly.  Continue Lipitor.  Recommend follow-up with cardiologist with past history.

## 2017-08-04 LAB — CYTOLOGY - PAP: Diagnosis: NEGATIVE

## 2017-08-10 ENCOUNTER — Other Ambulatory Visit: Payer: BLUE CROSS/BLUE SHIELD | Admitting: Internal Medicine

## 2017-08-10 DIAGNOSIS — Z23 Encounter for immunization: Secondary | ICD-10-CM

## 2017-08-10 DIAGNOSIS — I252 Old myocardial infarction: Secondary | ICD-10-CM

## 2017-08-10 DIAGNOSIS — F329 Major depressive disorder, single episode, unspecified: Secondary | ICD-10-CM | POA: Diagnosis not present

## 2017-08-10 DIAGNOSIS — Z13220 Encounter for screening for lipoid disorders: Secondary | ICD-10-CM

## 2017-08-10 DIAGNOSIS — E785 Hyperlipidemia, unspecified: Secondary | ICD-10-CM

## 2017-08-10 DIAGNOSIS — Z0000 Encounter for general adult medical examination without abnormal findings: Secondary | ICD-10-CM

## 2017-08-10 DIAGNOSIS — Z1329 Encounter for screening for other suspected endocrine disorder: Secondary | ICD-10-CM

## 2017-08-10 DIAGNOSIS — F32A Depression, unspecified: Secondary | ICD-10-CM

## 2017-08-11 LAB — CBC WITH DIFFERENTIAL/PLATELET
Basophils Absolute: 50 cells/uL (ref 0–200)
Basophils Relative: 0.6 %
Eosinophils Absolute: 166 cells/uL (ref 15–500)
Eosinophils Relative: 2 %
HEMATOCRIT: 42.6 % (ref 35.0–45.0)
HEMOGLOBIN: 14.8 g/dL (ref 11.7–15.5)
LYMPHS ABS: 2805 {cells}/uL (ref 850–3900)
MCH: 30.1 pg (ref 27.0–33.0)
MCHC: 34.7 g/dL (ref 32.0–36.0)
MCV: 86.6 fL (ref 80.0–100.0)
MPV: 11.6 fL (ref 7.5–12.5)
Monocytes Relative: 8.2 %
NEUTROS ABS: 4598 {cells}/uL (ref 1500–7800)
Neutrophils Relative %: 55.4 %
Platelets: 315 10*3/uL (ref 140–400)
RBC: 4.92 10*6/uL (ref 3.80–5.10)
RDW: 13.1 % (ref 11.0–15.0)
Total Lymphocyte: 33.8 %
WBC mixed population: 681 cells/uL (ref 200–950)
WBC: 8.3 10*3/uL (ref 3.8–10.8)

## 2017-08-11 LAB — HEMOGLOBIN A1C
EAG (MMOL/L): 6.2 (calc)
HEMOGLOBIN A1C: 5.5 %{Hb} (ref <5.7)
MEAN PLASMA GLUCOSE: 111 (calc)

## 2017-08-11 LAB — COMPLETE METABOLIC PANEL WITH GFR
AG Ratio: 1.4 (calc) (ref 1.0–2.5)
ALBUMIN MSPROF: 4.1 g/dL (ref 3.6–5.1)
ALT: 36 U/L — AB (ref 6–29)
AST: 45 U/L — AB (ref 10–30)
Alkaline phosphatase (APISO): 79 U/L (ref 33–115)
BUN: 9 mg/dL (ref 7–25)
CALCIUM: 9.5 mg/dL (ref 8.6–10.2)
CHLORIDE: 103 mmol/L (ref 98–110)
CO2: 27 mmol/L (ref 20–32)
Creat: 0.72 mg/dL (ref 0.50–1.10)
GFR, Est African American: 118 mL/min/{1.73_m2} (ref >=60)
GFR, Est Non African American: 102 mL/min/{1.73_m2} (ref >=60)
GLOBULIN: 2.9 g/dL (ref 1.9–3.7)
Glucose, Bld: 103 mg/dL — ABNORMAL HIGH (ref 65–99)
Potassium: 5 mmol/L (ref 3.5–5.3)
Sodium: 138 mmol/L (ref 135–146)
Total Bilirubin: 0.4 mg/dL (ref 0.2–1.2)
Total Protein: 7 g/dL (ref 6.1–8.1)

## 2017-08-11 LAB — TSH: TSH: 1.83 m[IU]/L

## 2017-08-11 LAB — LIPID PANEL
Cholesterol: 157 mg/dL (ref <200)
HDL: 61 mg/dL (ref >50)
LDL Cholesterol (Calc): 80 mg/dL (calc)
Non-HDL Cholesterol (Calc): 96 mg/dL (calc) (ref <130)
Total CHOL/HDL Ratio: 2.6 (calc) (ref <5.0)
Triglycerides: 81 mg/dL (ref <150)

## 2017-08-26 ENCOUNTER — Encounter: Payer: Self-pay | Admitting: Internal Medicine

## 2017-08-26 ENCOUNTER — Encounter (INDEPENDENT_AMBULATORY_CARE_PROVIDER_SITE_OTHER): Payer: BLUE CROSS/BLUE SHIELD

## 2017-08-26 DIAGNOSIS — Z1231 Encounter for screening mammogram for malignant neoplasm of breast: Secondary | ICD-10-CM | POA: Diagnosis not present

## 2017-08-27 NOTE — Patient Instructions (Signed)
Suggest  [facility] obesity clinic for weight loss.  Suggest being rechecked by cardiologist with history of MI.  For folliculitis triamcinolone cream sparingly twice a day.  Continue same medications.  It was a pleasure to see you today.

## 2017-08-31 ENCOUNTER — Encounter (INDEPENDENT_AMBULATORY_CARE_PROVIDER_SITE_OTHER): Payer: Self-pay | Admitting: Family Medicine

## 2017-08-31 ENCOUNTER — Ambulatory Visit (INDEPENDENT_AMBULATORY_CARE_PROVIDER_SITE_OTHER): Payer: BLUE CROSS/BLUE SHIELD | Admitting: Family Medicine

## 2017-08-31 VITALS — BP 174/114 | HR 95 | Temp 98.1°F | Ht 68.0 in | Wt 308.0 lb

## 2017-08-31 DIAGNOSIS — Z0289 Encounter for other administrative examinations: Secondary | ICD-10-CM

## 2017-08-31 DIAGNOSIS — R5383 Other fatigue: Secondary | ICD-10-CM | POA: Diagnosis not present

## 2017-08-31 DIAGNOSIS — Z1331 Encounter for screening for depression: Secondary | ICD-10-CM

## 2017-08-31 DIAGNOSIS — Z9189 Other specified personal risk factors, not elsewhere classified: Secondary | ICD-10-CM | POA: Diagnosis not present

## 2017-08-31 DIAGNOSIS — Z6841 Body Mass Index (BMI) 40.0 and over, adult: Secondary | ICD-10-CM

## 2017-08-31 DIAGNOSIS — I10 Essential (primary) hypertension: Secondary | ICD-10-CM

## 2017-08-31 DIAGNOSIS — R0602 Shortness of breath: Secondary | ICD-10-CM | POA: Insufficient documentation

## 2017-08-31 MED ORDER — LISINOPRIL 10 MG PO TABS
10.0000 mg | ORAL_TABLET | Freq: Every day | ORAL | 0 refills | Status: DC
Start: 2017-08-31 — End: 2017-09-14

## 2017-08-31 NOTE — Progress Notes (Signed)
Office: [phone]  /  Fax: [phone]   Dear Dr. Lenord Fellers,   Thank you for referring Rhonda Colon to our clinic. The following note includes my evaluation and treatment recommendations.  HPI:   Chief Complaint: OBESITY    Rhonda Colon has been referred by Rhonda Colon. Lenord Fellers, MD for consultation regarding her obesity and obesity related comorbidities.    Rhonda Colon (MR# [id number]) is a 42 y.o. female who presents on 08/31/2017 for obesity evaluation and treatment. Current BMI is Body mass index is 46.83 kg/m.[name] Rhonda Colon has been struggling with her weight for many years and has been unsuccessful in either losing weight, maintaining weight loss, or reaching her healthy weight goal.     Rhonda Colon attended our information session and states she is currently in the action stage of change and ready to dedicate time achieving and maintaining a healthier weight. Rhonda Colon is interested in becoming our patient and working on intensive lifestyle modifications including (but not limited to) diet, exercise and weight loss.    Rhonda Colon states her family eats meals together she thinks her family will eat healthier with  her she struggles with family and or coworkers weight loss sabotage her desired weight loss is 143 lbs she has been heavy most of  her life she started gaining weight as a child around 32 years old her heaviest weight ever was 310 lbs she has significant food cravings issues  she snacks frequently in the evenings she skips meals frequently she is frequently drinking liquids with calories she frequently makes poor food choices she frequently eats larger portions than normal  she struggles with emotional eating    Rhonda Colon feels her energy is lower than it should be. This has worsened with weight gain and has not worsened recently. Rhonda Colon admits to daytime somnolence and  admits to waking up still tired. Patient is at risk for obstructive sleep apnea. Patent has a history of symptoms  of daytime Rhonda. Patient generally gets 8 hours of sleep per night, and states they generally have generally restful sleep. Snoring is present. Apneic episodes are not present. Epworth Sleepiness Score is 9.  Dyspnea on exertion Rhonda Colon notes increasing shortness of breath with exercising and seems to be worsening over time with weight gain. She notes getting out of breath sooner with activity than she used to. This has not gotten worse recently. EKG-Tachycardia, normal sinus rhythm. Rhonda Colon denies orthopnea.  Hypertension Rhonda Colon is a 42 y.o. female with hypertension. Rhonda Colon blood pressure was elevated last month and again Colon. Blood pressure >160/100. CMP within normal limits and EKG, tachycardia. She denies chest pain. She is working weight loss to help control her blood pressure with the goal of decreasing her risk of heart attack and stroke. Rhonda Colon blood pressure is not currently controlled.  Depression Screen Rhonda Colon Food and Mood (modified PHQ-9) score was  Depression screen PHQ 2/9 08/31/2017  Decreased Interest 3  Down, Depressed, Hopeless 3  PHQ - 2 Score 6  Altered sleeping 3  Tired, decreased energy 3  Change in appetite 3  Feeling bad or failure about yourself  3  Trouble concentrating 3  Moving slowly or fidgety/restless 2  Suicidal thoughts 1  PHQ-9 Score 24  Difficult doing work/chores Extremely dIfficult   At risk for osteopenia and osteoporosis Rhonda Colon is at higher risk of osteopenia and osteoporosis due to vitamin D deficiency.   ALLERGIES: No Known Allergies  MEDICATIONS: Current Outpatient Medications on File Prior to Visit  Medication Sig Dispense Refill  . aspirin 81 MG tablet Take 81 mg by mouth daily.    [name] atorvastatin (LIPITOR) 40 MG tablet Take 1 tablet (40 mg total) by mouth daily at 6 PM. Please schedule appointment for refills. 90 tablet 3  . Multiple Vitamin (MULTIVITAMIN) capsule Take 1 capsule by mouth daily.    . nitroGLYCERIN (NITROSTAT)  0.4 MG SL tablet Place 1 tablet (0.4 mg total) under the tongue every 5 (five) minutes as needed for chest pain (up to 3 doses). 25 tablet 0  . omeprazole (PRILOSEC) 20 MG capsule Take 1 capsule (20 mg total) by mouth daily. 90 capsule 3  . RA KRILL OIL 500 MG CAPS Take by mouth.    . sertraline (ZOLOFT) 50 MG tablet Take 1 tablet (50 mg total) by mouth daily. 30 tablet 3   No current facility-administered medications on file prior to visit.     PAST MEDICAL HISTORY: Past Medical History:  Diagnosis Date  . CAD (coronary artery disease)    a. 07/2011 NSTEMI/Cath: RCA 99%m, otw nl cors, EF 50%, inf hk -> RCA stented with 3.0x61mm Promus DES  . Depression   . Heart disease   . Hyperlipidemia   . Leukocytosis    a. Noted 07/2011 (pt to f/u as OP to ensure resolution)  . Morgagni hernia    a. Discovered incidentally on CT 07/2011 ->f/u with Dr. Lindie Spruce  . Obesity   . Sinus bradycardia    a. Nocturnal, asymptomatic  . SOB (shortness of breath)   . Sun allergy   . Tobacco abuse    a. quit 07/2011    PAST SURGICAL HISTORY: Past Surgical History:  Procedure Laterality Date  . CARDIAC CATHETERIZATION     left heart, with angiogram  . heart attack  2013  . LEFT HEART CATHETERIZATION WITH CORONARY ANGIOGRAM N/A 08/10/2011   Procedure: LEFT HEART CATHETERIZATION WITH CORONARY ANGIOGRAM;  Surgeon: Kathleene Hazel, MD;  Location: [facility] CATH LAB;  Service: Cardiovascular;  Laterality: N/A;  . TONSILLECTOMY      SOCIAL HISTORY: Social History   Tobacco Use  . Smoking status: Former Smoker    Packs/day: 1.00    Years: 18.00    Pack years: 18.00    Types: Cigarettes    Last attempt to quit: 08/09/2011    Years since quitting: 6.0  . Smokeless tobacco: Never Used  . Tobacco comment: 2013  Substance Use Topics  . Alcohol use: Yes    Alcohol/week: 2.4 oz    Types: 4 Glasses of wine per week    Comment: occassional  . Drug use: No    FAMILY HISTORY: Family History  Problem  Relation Age of Onset  . Coronary artery disease Mother 41        stent  . Heart attack Mother   . Heart disease Mother   . Hyperlipidemia Mother   . Obesity Mother   . Colon cancer Maternal Uncle   . Arrhythmia Father   . Depression Father   . Coronary artery disease Maternal Aunt 92       (Mother's twin sister)  . Lung cancer Maternal Aunt   . Coronary artery disease Maternal Uncle 46  . Heart attack Maternal Aunt        mother's twin  . Heart attack Maternal Uncle   . Breast cancer Maternal Grandmother   . Cancer Neg Hx   . Early death Neg Hx   . Hypertension Neg Hx   .  Kidney disease Neg Hx   . Stroke Neg Hx   . Alcohol abuse Neg Hx   . Diabetes Neg Hx   . Drug abuse Neg Hx     ROS: Review of Systems  Constitutional: Positive for malaise/Rhonda. Negative for weight loss.       + Trouble sleeping  Eyes:       + Vision changes  Respiratory: Positive for shortness of breath (with exertion).   Cardiovascular: Negative for chest pain and orthopnea.  Gastrointestinal: Positive for heartburn.  Musculoskeletal: Positive for back pain.  Neurological: Positive for weakness.  Psychiatric/Behavioral: Positive for depression. Negative for suicidal ideas.    PHYSICAL EXAM: Blood pressure (!) 174/114, pulse 95, temperature 98.1 F (36.7 C), temperature source Oral, height 5\' 8"  (1.727 m), weight (!) 308 lb (139.7 kg), SpO2 92 %. Body mass index is 46.83 kg/m. Physical Exam  Constitutional: She is oriented to person, place, and time. She appears well-developed and well-nourished.  HENT:  Head: Normocephalic and atraumatic.  Nose: Nose normal.  Eyes: EOM are normal. No scleral icterus.  Neck: Normal range of motion. Neck supple. No thyromegaly present.  Cardiovascular: Normal rate and regular rhythm.  Pulmonary/Chest: Effort normal. No respiratory distress.  Abdominal: Soft. There is no tenderness.  + Obesity  Musculoskeletal:  Range of Motion normal in all 4  extremities Trace edema noted in bilateral lower extremities  Neurological: She is alert and oriented to person, place, and time. Coordination normal.  Skin: Skin is warm and dry.  Psychiatric: She has a normal mood and affect. Her behavior is normal.  Vitals reviewed.   RECENT LABS AND TESTS: BMET    Component Value Date/Time   NA 138 08/10/2017 0931   K 5.0 08/10/2017 0931   CL 103 08/10/2017 0931   CO2 27 08/10/2017 0931   GLUCOSE 103 (H) 08/10/2017 0931   BUN 9 08/10/2017 0931   CREATININE 0.72 08/10/2017 0931   CALCIUM 9.5 08/10/2017 0931   GFRNONAA 102 08/10/2017 0931   GFRAA 118 08/10/2017 0931   Lab Results  Component Value Date   HGBA1C 5.5 08/10/2017   No results found for: INSULIN CBC    Component Value Date/Time   WBC 8.3 08/10/2017 0931   RBC 4.92 08/10/2017 0931   HGB 14.8 08/10/2017 0931   HCT 42.6 08/10/2017 0931   PLT 315 08/10/2017 0931   MCV 86.6 08/10/2017 0931   MCH 30.1 08/10/2017 0931   MCHC 34.7 08/10/2017 0931   RDW 13.1 08/10/2017 0931   LYMPHSABS 2,805 08/10/2017 0931   MONOABS 1.3 (H) 08/09/2011 2119   EOSABS 166 08/10/2017 0931   BASOSABS 50 08/10/2017 0931   Iron/TIBC/Ferritin/ %Sat No results found for: IRON, TIBC, FERRITIN, IRONPCTSAT Lipid Panel     Component Value Date/Time   CHOL 157 08/10/2017 0931   TRIG 81 08/10/2017 0931   HDL 61 08/10/2017 0931   CHOLHDL 2.6 08/10/2017 0931   VLDL 9 06/14/2014 0907   LDLCALC 80 08/10/2017 0931   Hepatic Function Panel     Component Value Date/Time   PROT 7.0 08/10/2017 0931   ALBUMIN 4.3 06/14/2014 0853   AST 45 (H) 08/10/2017 0931   ALT 36 (H) 08/10/2017 0931   ALKPHOS 47 06/14/2014 0853   BILITOT 0.4 08/10/2017 0931   BILIDIR 0.2 06/14/2014 0853   IBILI 0.6 06/14/2014 0853      Component Value Date/Time   TSH 1.83 08/10/2017 0931   TSH 1.276 08/09/2011 2119    ECG  shows NSR with a rate of 102 BPM INDIRECT CALORIMETER done Colon shows a VO2 of 325 and a REE of 2258.   Her calculated basal metabolic rate is [id number] thus her basal metabolic rate is better than expected.    ASSESSMENT AND PLAN: Other Rhonda - Plan: EKG 12-Lead, Insulin, random, VITAMIN D 25 Hydroxy (Vit-D Deficiency, Fractures), Vitamin B12, Folate, T3, T4, free, TSH  Shortness of breath on exertion  Essential hypertension  Depression screening  At risk for osteoporosis  Class 3 severe obesity with serious comorbidity and body mass index (BMI) of 50.0 to 59.9 in adult, unspecified obesity type (HCC)  PLAN:  Rhonda Adonia was informed that her Rhonda may be related to obesity, depression or many other causes. Labs will be ordered, and in the meanwhile Rhonda Colon has agreed to work on diet, exercise and weight loss to help with Rhonda. Proper sleep hygiene was discussed including the need for 7-8 hours of quality sleep each night. A sleep study was not ordered based on symptoms and Epworth score.  Dyspnea on exertion Daejah's shortness of breath appears to be obesity related and exercise induced. She has agreed to work on weight loss and gradually increase exercise to treat her exercise induced shortness of breath. If Jossalyn follows our instructions and loses weight without improvement of her shortness of breath, we will plan to refer to pulmonology. We will monitor this condition regularly. Taquana agrees to this plan.  Hypertension We discussed sodium restriction, working on healthy weight loss, and a regular exercise program as the means to achieve improved blood pressure control. Rhonda Colon agreed with this plan and agreed to follow up as directed. We will continue to monitor her blood pressure as well as her progress with the above lifestyle modifications. Dyneshia agrees to start lisinopril 10 mg PO daily #30 with no refills. She will watch for signs of hypotension as she continues her lifestyle modifications. Lynnda agrees to follow up with our clinic in 2 weeks.  Depression Screen Rhonda Colon had a  strongly positive depression screening. Depression is commonly associated with obesity and often results in emotional eating behaviors. We will monitor this closely and work on CBT to help improve the non-hunger eating patterns. Referral to Psychology may be required if no improvement is seen as she continues in our clinic.  At risk for osteopenia and osteoporosis Gabrella is at risk for osteopenia and osteoporsis due to her vitamin D deficiency. She was encouraged to take her vitamin D and follow her higher calcium diet and increase strengthening exercise to help strengthen her bones and decrease her risk of osteopenia and osteoporosis.  Obesity Markan is currently in the action stage of change and her goal is to continue with weight loss efforts. I recommend Clair begin the structured treatment plan as follows:  She has agreed to follow the Category 3 plan Laveda AbbeLorie has been instructed to eventually work up to a goal of 150 minutes of combined cardio and strengthening exercise per week for weight loss and overall health benefits. We discussed the following Behavioral Modification Strategies Colon: increasing lean protein intake, increasing vegetables, work on meal planning and easy cooking plans, and planning for success   She was informed of the importance of frequent follow up visits to maximize her success with intensive lifestyle modifications for her multiple health conditions. She was informed we would discuss her lab results at her next visit unless there is a critical issue that needs to be addressed sooner. Anatalia agreed to keep  her next visit at the agreed upon time to discuss these results.    OBESITY BEHAVIORAL INTERVENTION VISIT  Colon's visit was # 1 out of 22.  Starting weight: 308 lbs Starting date: 08/31/17 Colon's weight : 308 lbs Colon's date: 08/31/2017 Total lbs lost to date: 0 (Patients must lose 7 lbs in the first 6 months to continue with counseling)   ASK: We discussed the  diagnosis of obesity with Rhonda Colon and Rupa agreed to give [place] permission to discuss obesity behavioral modification therapy Colon.  ASSESS: Shareena has the diagnosis of obesity and her BMI Colon is 46.84 Sophira is in the action stage of change   ADVISE: Brigit was educated on the multiple health risks of obesity as well as the benefit of weight loss to improve her health. She was advised of the need for long term treatment and the importance of lifestyle modifications.  AGREE: Multiple dietary modification options and treatment options were discussed and  Leala agreed to the above obesity treatment plan.   I, Burt Knack, am acting as transcriptionist for Debbra Riding, MD  I have reviewed the above documentation for accuracy and completeness, and I agree with the above. - Debbra Riding, MD

## 2017-09-01 LAB — T4, FREE: FREE T4: 1.27 ng/dL (ref 0.82–1.77)

## 2017-09-01 LAB — FOLATE: Folate: >20 ng/mL (ref >3.0)

## 2017-09-01 LAB — TSH: TSH: 1.93 u[IU]/mL (ref 0.450–4.500)

## 2017-09-01 LAB — T3: T3 TOTAL: 171 ng/dL (ref 71–180)

## 2017-09-01 LAB — VITAMIN B12: VITAMIN B 12: 511 pg/mL (ref 232–1245)

## 2017-09-01 LAB — INSULIN, RANDOM: INSULIN: 19.7 u[IU]/mL (ref 2.6–24.9)

## 2017-09-01 LAB — VITAMIN D 25 HYDROXY (VIT D DEFICIENCY, FRACTURES): VIT D 25 HYDROXY: 25.5 ng/mL — AB (ref 30.0–100.0)

## 2017-09-14 ENCOUNTER — Ambulatory Visit (INDEPENDENT_AMBULATORY_CARE_PROVIDER_SITE_OTHER): Payer: BLUE CROSS/BLUE SHIELD | Admitting: Psychology

## 2017-09-14 ENCOUNTER — Ambulatory Visit (INDEPENDENT_AMBULATORY_CARE_PROVIDER_SITE_OTHER): Payer: BLUE CROSS/BLUE SHIELD | Admitting: Family Medicine

## 2017-09-14 VITALS — BP 128/86 | HR 80 | Temp 98.1°F | Ht 68.0 in | Wt 296.0 lb

## 2017-09-14 DIAGNOSIS — E8881 Metabolic syndrome: Secondary | ICD-10-CM

## 2017-09-14 DIAGNOSIS — R945 Abnormal results of liver function studies: Secondary | ICD-10-CM

## 2017-09-14 DIAGNOSIS — E559 Vitamin D deficiency, unspecified: Secondary | ICD-10-CM | POA: Diagnosis not present

## 2017-09-14 DIAGNOSIS — F3289 Other specified depressive episodes: Secondary | ICD-10-CM | POA: Diagnosis not present

## 2017-09-14 DIAGNOSIS — E88819 Insulin resistance, unspecified: Secondary | ICD-10-CM

## 2017-09-14 DIAGNOSIS — Z6841 Body Mass Index (BMI) 40.0 and over, adult: Secondary | ICD-10-CM

## 2017-09-14 DIAGNOSIS — R7989 Other specified abnormal findings of blood chemistry: Secondary | ICD-10-CM

## 2017-09-14 DIAGNOSIS — E66813 Obesity, class 3: Secondary | ICD-10-CM

## 2017-09-14 DIAGNOSIS — I10 Essential (primary) hypertension: Secondary | ICD-10-CM

## 2017-09-14 DIAGNOSIS — Z9189 Other specified personal risk factors, not elsewhere classified: Secondary | ICD-10-CM

## 2017-09-14 MED ORDER — LISINOPRIL 10 MG PO TABS: 10.0000 mg | ORAL_TABLET | Freq: Every day | ORAL | 0 refills | Status: DC

## 2017-09-14 MED ORDER — VITAMIN D (ERGOCALCIFEROL) 1.25 MG (50000 UNIT) PO CAPS: 50000.0000 [IU] | ORAL_CAPSULE | ORAL | 0 refills | Status: DC

## 2017-09-14 NOTE — Progress Notes (Addendum)
Office: [phone]  /  Fax: [phone] Date: September 14, 2017 Time Seen: 1:08pm Duration: 52 minutes Provider: Lawerance Cruel, PsyD Type of Session: Intake for Individual Therapy  Informed Consent: The provider's role was explained to Rhonda Colon. The provider discussed issues of confidentiality, privacy, and limits therein; anticipated course of treatment; potential risks involved with psychotherapy; voluntary nature of treatment; and the clinic's cancellation policy. In addition to written consent, verbal informed consent for psychological services was also obtained from Rhonda Colon prior to initial intake interview.   Rhonda Colon was informed that information about mental health appointments will be entered in the medical record at [facility]) via Epic. Rhonda Colon agreed information may be shared with other [facility] providers as needed for coordination of care. Written consent was also provided for this provider to coordinate care with other providers at [facility].The provider also explained leaving voicemail messages and MyChart can be utilized for non-emergency reasons and limits of confidentiality related to communication via technology was discussed. Rhonda Colon was also informed the clinic is not a 24/7 crisis center and mental health emergency resources were shared. A handout with emergency resources was also provided. Rhonda Colon verbally acknowledged understanding and also agreed to use mental health emergency resources discussed if needed.   Chief Complaint:  Rhonda Colon was referred by [name]. On August 31, 2017, Rhonda Colon's modified PHQ-9 score was 24 and Colon noted Colon found the symptoms to be extremely difficult. Per Rhonda Colon currently experiences sadness; crying spells; irritability; fidgeting; lack of energy; concentration issues; and social withdrawal, including withdrawal from doctors. However, Colon reported Colon has been focusing on her  eating plan and that has helped reduce emotional eating. Her overall focus has also improved. Moreover, Rhonda Colon shared experiencing sadness and grief related to her maternal aunt passing away at the end of February unexpectedly. Furthermore, Colon discontinued taking Zoloft that was prescribed by her OB/GYN and was receptive to the recommendation by this provider to discuss the discontinuation of medication with [name].   HPI: Rhonda Colon shared Colon suffered a heart attack in 2013 and subsequently stopped smoking, began eating healthy, and exercising. This resulted in her losing 100 pounds. However, in 2016, Rhonda Colon shared, "I gave up and the weight just piled on." Since then, Colon stated Colon gained 160 pounds.   Mental Status Examination:  Rhonda Colon arrived on time for the appointment; however, the session was initiated late due to check-in procedures. Colon presented as appropriately dressed and groomed. Rhonda Colon appeared her stated age and demonstrated adequate orientation to time, place, person, and purpose of the appointment. Colon also demonstrated appropriate eye contact. No psychomotor abnormalities or behavioral peculiarities noted. Her mood was sad with congruent affect. Rhonda Colon was observed becoming tearful throughout session. Her thought processes were logical, linear, and goal-directed. No hallucinations, delusions, bizarre thinking or behavior reported or observed. Judgment, insight, and impulse control appeared to be grossly intact. There was no evidence of paraphasias (i.e., errors in speech, gross mispronunciations, and word substitutions), repetition deficits, or disturbances in volume or prosody (i.e., rhythm and intonation). There was no evidence of attention or memory impairments. [name] denied current suicidal and homicidal ideation, plan, and intent.   The Mini-Mental State Examination, Second Edition (MMSE-2) was administered. The MMSE-2 briefly screens for cognitive dysfunction and overall mental status  and assesses different cognitive domains: orientation, registration, attention and calculation, recall, and language and praxis. Rhonda Colon received 28 out of 30 points possible on the MMSE-2. A  point was lost on a recall test as Rhonda Colon recalled two of three words after a short delay. On the Attention and Calculation task, one point was lost.   Family & Psychosocial History: Rhonda Colon is currently in a relationship and they have been together for 20 years. Rhonda Colon noted Colon has two step-children. 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 Rhonda Colon shared Colon enjoys it and works with her mother. Colon noted her highest level of education is two years of community college, but indicated Colon did not obtain a degree. Her current social support consists of her boyfriend, mother, and father.   Medical History:  Per Rhonda Colon, her medical history is significant for high blood pressure and heart disease. Her current medications include Atorvastatin, Lisinopril, and over the counter vitamins. Regarding surgeries, Colon indicated Colon has a stent in her heart. Rhonda Colon denied a history of head injuries and loss of consciousness. Rhonda Colon plans to see her cardiologist soon and noted, "I really like him."  Mental Health History:  Rhonda Colon received therapeutic services in her 2320s for a "few sessions." Colon denied history of hospitalizations for psychiatric reasons and noted Colon has never seen a psychiatrist. Rhonda Colon also denied a trauma history, including sexual, physical, and psychological abuse as well as neglect. Regarding sleep, [name] reported Colon sleeps "too much." This was explored further and Colon noted Colon averages seven hours a night and naps for about 30 minutes during the day. [name] denied the following: appetite issues; obsessions and compulsions; mania; engagement in self-injurious behaviors; and history of and current suicidal and homicidal ideation, plan, and intent. Notably, Colon  endorsed appetite issues on the PHQ-9, but explained Colon responded to that question in general rather than the last two weeks. 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 Regarding substance use, Yulitza shared Colon drank "too much wine;" however, it has reduced since starting with the clinic. Previously, Colon would consume a bottle of wine across the span of three to four hours on the weekends.   Structured Assessment Results:  The Patient Health Questionnaire-9 (PHQ-9) is a self-report measure that assesses symptoms and severity of depression over the course of the last two weeks. Cadey obtained a score of nine suggesting mild depression. The following items were endorsed: little interest or pleasure in doing things; feeling down, depressed, or hopeless; trouble falling or staying asleep, or sleeping too much; feeling tired or having little energy; poor appetite or overeating; feeling bad about yourself; trouble concentrating; and fidgeting. Cyndal finds the aforementioned symptoms to be somewhat difficult.   Interventions:  A chart review was conducted prior to the clinical intake interview. The MMSE-2 and PHQ-9 were administered and a clinical intake interview was completed. Throughout session, empathic reflections and validation was provided. Continuing treatment with this provider was discussed and treatment goals were established. Psychoeducation regarding the connection between our thoughts, feelings, and behaviors were provided. Psychoeducation regarding "should" statement as it relates to distorted thinking was provided. Kristianna was encouraged to note when Colon engaged in "should" thinking in the next two weeks.   Provisional DSM-5 Diagnosis: 311 (F32.8) Other Specified Depressive Disorder, Recurrent Brief Depression and Emotional Eating   Plan:  Ymani expressed understanding and agreement with the initial treatment plan of care. Colon appears able and  willing to participate as evidenced by collaboration on treatment goals, asking questions for clarification, and engagement in reciprocal conversation.  The next appointment will be scheduled in two weeks. The following treatment goals were established: decreasing symptoms of depression and increasing self-compassion. The following session will focus on continuing to establish rapport, gather further historical information, and work toward her goals.

## 2017-09-15 NOTE — Progress Notes (Signed)
Office: [phone]  /  Fax: [phone]   HPI:   Chief Complaint: OBESITY Rhonda Colon is here to discuss her progress with her obesity treatment plan. She is on the Category 3 plan and is following her eating plan approximately 70 % of the time. She states she is exercising 0 minutes 0 times per week. Heiress tried had to work on getting all food in. Hasn't been getting all snacks in secondary to being full. Missed dinner 2 times.  Her weight is 296 lb (134.3 kg) today and has had a weight loss of 12 pounds over a period of 2 weeks since her last visit. She has lost 12 lbs since starting treatment with [place].  Hypertension Rhonda Colon is a 42 y.o. female with hypertension. Rhonda Colon's blood pressure is controlled today. She denies chest pain, chest pressure, or headache. She is working weight loss to help control her blood pressure with the goal of decreasing her risk of heart attack and stroke.   Elevated LFTs Rhonda Colon's AST and ALT newly elevated. Her BMI is over 40. She denies abdominal pain or jaundice and has never been told of any liver problems in the past. She denies history of alcoholism.  Vitamin D Deficiency Rhonda Colon has a diagnosis of vitamin D deficiency. She is on daily multivitamins and denies nausea, vomiting or muscle weakness.  Insulin Resistance Rhonda Colon has a diagnosis of insulin resistance based on her elevated fasting insulin level >5. Insulin of 19.7 and Hgb A1c of 5.5. Although Rhonda Colon's blood glucose readings are still under good control, insulin resistance puts her at greater risk of metabolic syndrome and diabetes. She is not taking metformin currently and continues to work on diet and exercise to decrease risk of diabetes.  At risk for diabetes Rhonda Colon is at higher than average risk for developing diabetes due to her obesity and insulin resistance. She currently denies polyuria or polydipsia.  Depression with emotional eating behaviors Rhonda Colon is struggling with emotional eating  and using food for comfort to the extent that it is negatively impacting her health. She often snacks when she is not hungry. Rhonda Colon sometimes feels she is out of control and then feels guilty that she made poor food choices. She has been working on behavior modification techniques to help reduce her emotional eating and has been somewhat successful. She admits sadness and she shows no sign of suicidal or homicidal ideations.  Depression screen [facility] 2/9 09/14/2017 08/31/2017 08/02/2017  Decreased Interest 1 3 2   Down, Depressed, Hopeless 1 3 2   PHQ - 2 Score 2 6 4   Altered sleeping 1 3 2   Tired, decreased energy 2 3 2   Change in appetite 1 3 2   Feeling bad or failure about yourself  1 3 2   Trouble concentrating 1 3 2   Moving slowly or fidgety/restless 1 2 1   Suicidal thoughts 0 1 0  PHQ-9 Score 9 24 15   Difficult doing work/chores - Extremely dIfficult -    ALLERGIES: No Known Allergies  MEDICATIONS: Current Outpatient Medications on File Prior to Visit  Medication Sig Dispense Refill  . aspirin 81 MG tablet Take 81 mg by mouth daily.    [name] atorvastatin (LIPITOR) 40 MG tablet Take 1 tablet (40 mg total) by mouth daily at 6 PM. Please schedule appointment for refills. 90 tablet 3  . Multiple Vitamin (MULTIVITAMIN) capsule Take 1 capsule by mouth daily.    . nitroGLYCERIN (NITROSTAT) 0.4 MG SL tablet Place 1 tablet (0.4 mg total) under the tongue  every 5 (five) minutes as needed for chest pain (up to 3 doses). 25 tablet 0  . omeprazole (PRILOSEC) 20 MG capsule Take 1 capsule (20 mg total) by mouth daily. 90 capsule 3  . RA KRILL OIL 500 MG CAPS Take by mouth.     No current facility-administered medications on file prior to visit.     PAST MEDICAL HISTORY: Past Medical History:  Diagnosis Date  . CAD (coronary artery disease)    a. 07/2011 NSTEMI/Cath: RCA 99%m, otw nl cors, EF 50%, inf hk -> RCA stented with 3.0x31mm Promus DES  . Depression   . Heart disease   . Hyperlipidemia   .  Leukocytosis    a. Noted 07/2011 (pt to f/u as OP to ensure resolution)  . Morgagni hernia    a. Discovered incidentally on CT 07/2011 ->f/u with Dr. Lindie Spruce  . Obesity   . Sinus bradycardia    a. Nocturnal, asymptomatic  . SOB (shortness of breath)   . Sun allergy   . Tobacco abuse    a. quit 07/2011    PAST SURGICAL HISTORY: Past Surgical History:  Procedure Laterality Date  . CARDIAC CATHETERIZATION     left heart, with angiogram  . heart attack  2013  . LEFT HEART CATHETERIZATION WITH CORONARY ANGIOGRAM N/A 08/10/2011   Procedure: LEFT HEART CATHETERIZATION WITH CORONARY ANGIOGRAM;  Surgeon: Kathleene Hazel, MD;  Location: [facility] CATH LAB;  Service: Cardiovascular;  Laterality: N/A;  . TONSILLECTOMY      SOCIAL HISTORY: Social History   Tobacco Use  . Smoking status: Former Smoker    Packs/day: 1.00    Years: 18.00    Pack years: 18.00    Types: Cigarettes    Last attempt to quit: 08/09/2011    Years since quitting: 6.1  . Smokeless tobacco: Never Used  . Tobacco comment: 2013  Substance Use Topics  . Alcohol use: Yes    Alcohol/week: 2.4 oz    Types: 4 Glasses of wine per week    Comment: occassional  . Drug use: No    FAMILY HISTORY: Family History  Problem Relation Age of Onset  . Coronary artery disease Mother 110        stent  . Heart attack Mother   . Heart disease Mother   . Hyperlipidemia Mother   . Obesity Mother   . Colon cancer Maternal Uncle   . Arrhythmia Father   . Depression Father   . Coronary artery disease Maternal Aunt 74       (Mother's twin sister)  . Lung cancer Maternal Aunt   . Coronary artery disease Maternal Uncle 46  . Heart attack Maternal Aunt        mother's twin  . Heart attack Maternal Uncle   . Breast cancer Maternal Grandmother   . Cancer Neg Hx   . Early death Neg Hx   . Hypertension Neg Hx   . Kidney disease Neg Hx   . Stroke Neg Hx   . Alcohol abuse Neg Hx   . Diabetes Neg Hx   . Drug abuse Neg Hx      ROS: Review of Systems  Constitutional: Positive for weight loss.  Eyes:       Negative jaundice  Cardiovascular: Negative for chest pain.       Negative chest pressure  Gastrointestinal: Negative for abdominal pain, nausea and vomiting.  Genitourinary: Negative for frequency.  Musculoskeletal:       Negative muscle weakness  Neurological: Negative for headaches.  Endo/Heme/Allergies: Negative for polydipsia.  Psychiatric/Behavioral: Positive for depression. Negative for suicidal ideas.    PHYSICAL EXAM: Blood pressure 128/86, pulse 80, temperature 98.1 F (36.7 C), temperature source Oral, height 5\' 8"  (1.727 m), weight 296 lb (134.3 kg), SpO2 97 %. Body mass index is 45.01 kg/m. Physical Exam  Constitutional: She is oriented to person, place, and time. She appears well-developed and well-nourished.  Cardiovascular: Normal rate.  Pulmonary/Chest: Effort normal.  Musculoskeletal: Normal range of motion.  Neurological: She is oriented to person, place, and time.  Skin: Skin is warm and dry.  Psychiatric: She has a normal mood and affect. Her behavior is normal.  Vitals reviewed.   RECENT LABS AND TESTS: BMET    Component Value Date/Time   NA 138 08/10/2017 0931   K 5.0 08/10/2017 0931   CL 103 08/10/2017 0931   CO2 27 08/10/2017 0931   GLUCOSE 103 (H) 08/10/2017 0931   BUN 9 08/10/2017 0931   CREATININE 0.72 08/10/2017 0931   CALCIUM 9.5 08/10/2017 0931   GFRNONAA 102 08/10/2017 0931   GFRAA 118 08/10/2017 0931   Lab Results  Component Value Date   HGBA1C 5.5 08/10/2017   HGBA1C 5.3 08/10/2011   Lab Results  Component Value Date   INSULIN 19.7 08/31/2017   CBC    Component Value Date/Time   WBC 8.3 08/10/2017 0931   RBC 4.92 08/10/2017 0931   HGB 14.8 08/10/2017 0931   HCT 42.6 08/10/2017 0931   PLT 315 08/10/2017 0931   MCV 86.6 08/10/2017 0931   MCH 30.1 08/10/2017 0931   MCHC 34.7 08/10/2017 0931   RDW 13.1 08/10/2017 0931   LYMPHSABS 2,805  08/10/2017 0931   MONOABS 1.3 (H) 08/09/2011 2119   EOSABS 166 08/10/2017 0931   BASOSABS 50 08/10/2017 0931   Iron/TIBC/Ferritin/ %Sat No results found for: IRON, TIBC, FERRITIN, IRONPCTSAT Lipid Panel     Component Value Date/Time   CHOL 157 08/10/2017 0931   TRIG 81 08/10/2017 0931   HDL 61 08/10/2017 0931   CHOLHDL 2.6 08/10/2017 0931   VLDL 9 06/14/2014 0907   LDLCALC 80 08/10/2017 0931   Hepatic Function Panel     Component Value Date/Time   PROT 7.0 08/10/2017 0931   ALBUMIN 4.3 06/14/2014 0853   AST 45 (H) 08/10/2017 0931   ALT 36 (H) 08/10/2017 0931   ALKPHOS 47 06/14/2014 0853   BILITOT 0.4 08/10/2017 0931   BILIDIR 0.2 06/14/2014 0853   IBILI 0.6 06/14/2014 0853      Component Value Date/Time   TSH 1.930 08/31/2017 1051   TSH 1.83 08/10/2017 0931   TSH 1.276 08/09/2011 2119  Results for LADEIDRA, BORYS (MRN [id number]) as of 09/15/2017 11:20  Ref. Range 08/31/2017 10:51  Vitamin D, 25-Hydroxy Latest Ref Range: 30.0 - 100.0 ng/mL 25.5 (L)    ASSESSMENT AND PLAN: Essential hypertension - Plan: lisinopril (PRINIVIL,ZESTRIL) 10 MG tablet  Vitamin D deficiency - Plan: Vitamin D, Ergocalciferol, (DRISDOL) 50000 units CAPS capsule  Insulin resistance  Other depression - with emotional eating  Elevated LFTs - Plan: US Abdomen Limited RUQ  At risk for diabetes mellitus  Class 3 severe obesity with serious comorbidity and body mass index (BMI) of 45.0 to 49.9 in adult, unspecified obesity type (HCC)  PLAN:  Hypertension We discussed sodium restriction, working on healthy weight loss, and a regular exercise program as the means to achieve improved blood pressure control. Rhonda Colon agreed with this plan and agreed to  follow up as directed. We will continue to monitor her blood pressure as well as her progress with the above lifestyle modifications. Emmary agrees to continue taking lisinopril 10 mg PO daily #30 and we will refill for 1 month. She will watch for signs of  hypotension as she continues her lifestyle modifications. Rhonda Colon agrees to follow up with our clinic in 2 weeks.  Elevated LFTs We discussed the likely diagnosis of non alcoholic fatty liver disease today and how this condition is obesity related. Rhonda Colon was educated on her risk of developing NASH or even liver failure and th only proven treatment for NAFLD was weight loss. Rhonda Colon agreed to continue with her weight loss efforts with healthier diet and exercise as an essential part of her treatment plan. We have sent a order to Rhonda Colon for right upper quadrant ultrasound and Amylee agrees to follow up with our clinic in 2 weeks.  Vitamin D Deficiency Rhonda Colon was informed that low vitamin D levels contributes to fatigue and are associated with obesity, breast, and colon cancer. Rhonda Colon agrees to start prescription Vit D @50 ,000 IU every week #4 with no refills, can continue multivitamin. She will follow up for routine testing of vitamin D, at least 2-3 times per year. She was informed of the risk of over-replacement of vitamin D and agrees to not increase her dose unless she discusses this with us first. We will retest Vit D in 3 months and Rhonda Colon agrees to follow up with our clinic in 2 weeks.  Insulin Resistance Rhonda Colon will continue Category 3 plan and continue to work on weight loss, exercise, and decreasing simple carbohydrates in her diet to help decrease the risk of diabetes. We dicussed metformin including benefits and risks. She was informed that eating too many simple carbohydrates or too many calories at one sitting increases the likelihood of GI side effects. Rhonda Colon declined metformin for now and prescription was not written today. Rhonda Colon agrees to follow up with our clinic in 2 weeks as directed to monitor her progress.  Diabetes risk counselling Rhonda Colon was given extended (30 minutes) diabetes prevention counseling today. She is 42 y.o. female and has risk factors for diabetes including obesity  and insulin resistance. We discussed intensive lifestyle modifications today with an emphasis on weight loss as well as increasing exercise and decreasing simple carbohydrates in her diet.  Depression with Emotional Eating Behaviors We discussed behavior modification techniques today to help Cataleia deal with her emotional eating and depression. Diego will follow up with Dr. Dewaine CongerBarker and we will discuss SNRI at next visit. Joeline agrees to follow up with our clinic in 2 weeks.  Obesity Nimah is currently in the action stage of change. As such, her goal is to continue with weight loss efforts She has agreed to follow the Category 3 plan Laveda AbbeLorie has been instructed to work up to a goal of 150 minutes of combined cardio and strengthening exercise per week for weight loss and overall health benefits. We discussed the following Behavioral Modification Strategies today: increasing lean protein intake, increasing vegetables, work on meal planning and easy cooking plans, no skipping meals, and planning for success   Bennette has agreed to follow up with our clinic in 2 weeks. She was informed of the importance of frequent follow up visits to maximize her success with intensive lifestyle modifications for her multiple health conditions.   OBESITY BEHAVIORAL INTERVENTION VISIT  Today's visit was # 2 out of 22.  Starting weight: 308 lbs Starting  date: 08/31/17 Today's weight : 296 lbs  Today's date: 09/14/2017 Total lbs lost to date: 12 (Patients must lose 7 lbs in the first 6 months to continue with counseling)   ASK: We discussed the diagnosis of obesity with Annia Friendly today and Jyssica agreed to give [place] permission to discuss obesity behavioral modification therapy today.  ASSESS: Latesha has the diagnosis of obesity and her BMI today is 45.02 Charmaine is in the action stage of change   ADVISE: Naliyah was educated on the multiple health risks of obesity as well as the benefit of weight loss to improve  her health. She was advised of the need for long term treatment and the importance of lifestyle modifications.  AGREE: Multiple dietary modification options and treatment options were discussed and  Kianah agreed to the above obesity treatment plan.  I, Burt Knack, am acting as transcriptionist for Debbra Riding, MD  I have reviewed the above documentation for accuracy and completeness, and I agree with the above. - Debbra Riding, MD

## 2017-09-16 NOTE — Progress Notes (Signed)
HPI The patient presents for followup after a previous MI . Since I last saw her she has done poorly from a psychosocial standpoint.  She has continued to gain weight and has not exercised.  She was treated with Prozac but she did not feel any better and did not continue this.  With her usual activity she denies any cardiovascular symptoms.  The patient denies any new symptoms such as chest discomfort, neck or arm discomfort. There has been no new shortness of breath, PND or orthopnea. There have been no reported palpitations, presyncope or syncope.   She has started an intensive weight loss program at [facility].  Of note her BP was elevated and she had lisinopril started recently.     No Known Allergies  Current Outpatient Medications  Medication Sig Dispense Refill  . aspirin 81 MG tablet Take 81 mg by mouth daily.    [name] atorvastatin (LIPITOR) 40 MG tablet Take 1 tablet (40 mg total) by mouth daily at 6 PM. Please schedule appointment for refills. 90 tablet 3  . lisinopril (PRINIVIL,ZESTRIL) 10 MG tablet Take 1 tablet (10 mg total) by mouth daily. 30 tablet 0  . Multiple Vitamin (MULTIVITAMIN) capsule Take 1 capsule by mouth daily.    . nitroGLYCERIN (NITROSTAT) 0.4 MG SL tablet Place 1 tablet (0.4 mg total) under the tongue every 5 (five) minutes as needed for chest pain (up to 3 doses). 25 tablet 0  . omeprazole (PRILOSEC) 20 MG capsule Take 1 capsule (20 mg total) by mouth daily. 90 capsule 3  . RA KRILL OIL 500 MG CAPS Take by mouth.    . Vitamin D, Ergocalciferol, (DRISDOL) 50000 units CAPS capsule Take 1 capsule (50,000 Units total) by mouth every 7 (seven) days. 4 capsule 0   No current facility-administered medications for this visit.     Past Medical History:  Diagnosis Date  . CAD (coronary artery disease)    a. 07/2011 NSTEMI/Cath: RCA 99%m, otw nl cors, EF 50%, inf hk -> RCA stented with 3.0x91mm Promus DES  . Depression   . Hyperlipidemia   . Morgagni  hernia    a. Discovered incidentally on CT 07/2011 ->f/u with Dr. Lindie Spruce  . Obesity   . Sinus bradycardia    a. Nocturnal, asymptomatic  . Sun allergy   . Tobacco abuse    a. quit 07/2011    Past Surgical History:  Procedure Laterality Date  . CARDIAC CATHETERIZATION     left heart, with angiogram  . heart attack  2013  . LEFT HEART CATHETERIZATION WITH CORONARY ANGIOGRAM N/A 08/10/2011   Procedure: LEFT HEART CATHETERIZATION WITH CORONARY ANGIOGRAM;  Surgeon: Kathleene Hazel, MD;  Location: [facility] CATH LAB;  Service: Cardiovascular;  Laterality: N/A;  . TONSILLECTOMY      ROS:   As stated in the HPI and negative for all other systems.  PHYSICAL EXAM BP 102/80   Pulse 94   Ht 5\' 4"  (1.626 m)   Wt 297 lb 12.8 oz (135.1 kg)   BMI 51.12 kg/m   GENERAL:  Well appearing NECK:  No jugular venous distention, waveform within normal limits, carotid upstroke brisk and symmetric, no bruits, no thyromegaly LUNGS:  Clear to auscultation bilaterally CHEST:  Unremarkable HEART:  PMI not displaced or sustained,S1 and S2 within normal limits, no S3, no S4, no clicks, no rubs, no murmurs ABD:  Flat, positive bowel sounds normal in frequency in pitch, no bruits, no rebound, no guarding,  no midline pulsatile mass, no hepatomegaly, no splenomegaly EXT:  2 plus pulses throughout, no edema, no cyanosis no clubbing   EKG:  Sinus rhythm, rate 94, axis within normal limits, intervals within normal limits, no acute ST-T wave changes.  Poor anterior R wave progression unchanged from previous.  09/17/2017   Lab Results  Component Value Date   CHOL 157 08/10/2017   TRIG 81 08/10/2017   HDL 61 08/10/2017   LDLCALC 80 08/10/2017    ASSESSMENT AND PLAN  CAD:   The patient has no new sypmtoms.  No further cardiovascular testing is indicated.  We will continue with aggressive risk reduction and meds as listed.  HL:     Her LDL was as above.  However, the liver enzymes are mildly elevated and I would  not go up on her Lipitor at this point.  I will continue current meds and I suspect that lifestyle will bring this to goal.    Tob Abuse:   She is still not smoking.   Weight:  She is on her weight loss plan.   Depression:    There is a psychiatrist as part of her new team.  I think this is a significant risk factor for future cardiovascular events and needs to be treated.

## 2017-09-17 ENCOUNTER — Encounter: Payer: Self-pay | Admitting: Cardiology

## 2017-09-17 ENCOUNTER — Ambulatory Visit (HOSPITAL_BASED_OUTPATIENT_CLINIC_OR_DEPARTMENT_OTHER)
Admission: RE | Admit: 2017-09-17 | Discharge: 2017-09-17 | Disposition: A | Discharge status: Home / Self Care | Payer: BLUE CROSS/BLUE SHIELD | Source: Ambulatory Visit | Attending: Family Medicine | Admitting: Family Medicine

## 2017-09-17 ENCOUNTER — Encounter

## 2017-09-17 ENCOUNTER — Ambulatory Visit: Payer: BLUE CROSS/BLUE SHIELD | Admitting: Cardiology

## 2017-09-17 VITALS — BP 102/80 | HR 94 | Ht 64.0 in | Wt 297.8 lb

## 2017-09-17 DIAGNOSIS — R7989 Other specified abnormal findings of blood chemistry: Secondary | ICD-10-CM

## 2017-09-17 DIAGNOSIS — I2510 Atherosclerotic heart disease of native coronary artery without angina pectoris: Secondary | ICD-10-CM

## 2017-09-17 DIAGNOSIS — I10 Essential (primary) hypertension: Secondary | ICD-10-CM

## 2017-09-17 DIAGNOSIS — R945 Abnormal results of liver function studies: Secondary | ICD-10-CM

## 2017-09-17 DIAGNOSIS — E785 Hyperlipidemia, unspecified: Secondary | ICD-10-CM | POA: Diagnosis not present

## 2017-09-17 DIAGNOSIS — K760 Fatty (change of) liver, not elsewhere classified: Secondary | ICD-10-CM | POA: Insufficient documentation

## 2017-09-17 MED ORDER — ATORVASTATIN CALCIUM 40 MG PO TABS: 40.0000 mg | ORAL_TABLET | Freq: Every day | ORAL | 3 refills | Status: DC

## 2017-09-17 MED ORDER — NITROGLYCERIN 0.4 MG SL SUBL: 0.4000 mg | SUBLINGUAL_TABLET | SUBLINGUAL | 1 refills | Status: DC | PRN

## 2017-09-17 MED ORDER — LISINOPRIL 10 MG PO TABS: 10.0000 mg | ORAL_TABLET | Freq: Every day | ORAL | 3 refills | Status: DC

## 2017-09-17 NOTE — Patient Instructions (Signed)
Medication Instructions:  Continue current medications  If you need a refill on your cardiac medications before your next appointment, please call your pharmacy.  Labwork: None Ordered   Testing/Procedures: None Ordered  Follow-Up: Your physician wants you to follow-up in: 1 Year. You should receive a reminder letter in the mail two months in advance. If you do not receive a letter, please call our office [phone].    Thank you for choosing [facility]!!      

## 2017-09-17 NOTE — Addendum Note (Signed)
Addended by: Neta EhlersRUITT, Maidie Streight M on: 09/17/2017 04:30 PM   Modules accepted: Orders

## 2017-09-17 NOTE — Addendum Note (Signed)
Addended by: Barrie DunkerHOMAS, Nyaja [place] N on: 09/17/2017 04:46 PM   Modules accepted: Orders

## 2017-09-19 ENCOUNTER — Encounter: Payer: Self-pay | Admitting: Cardiology

## 2017-09-22 NOTE — Progress Notes (Signed)
Office: [phone]  /  Fax: [phone]   Date: September 29, 2017 Time Seen: 9:37am Duration: 33 minutes Provider: Glennie Isle, Psy.D. Type of Session: Individual Therapy   HPI: Per the note for the initial visit with this provider on September 14, 2017, Rhonda Colon currently experiences sadness; crying spells; irritability; fidgeting; lack of energy; concentration issues; and social withdrawal, including withdrawal from doctors. However, she reported she has been focusing on her eating plan and that has helped reduce emotional eating. Her overall focus has also improved. Moreover, [name] shared experiencing sadness and grief related to her maternal aunt passing away at the end of February unexpectedly. Furthermore, [name] shared she suffered a heart attack in 2013 and subsequently stopped smoking, began eating healthy, and exercising. This resulted in her losing 100 pounds. However, in 2016, [name] shared, "I gave up and the weight just piled on." Since then, she stated she gained 160 pounds.   Session Content: Session focused on the goals of decreasing symptoms of depression and increasing self-compassion. The provider conducted a brief check-in. [name] shared she lost a pound, but noted current family stress and eating out. She also discussed cleaning in the past week due to increased energy. [name] reported she met with her cardiologist after two years, and she noted, "I love him" and "I got closure with him." The session then focused on reviewing the connection between thoughts, feelings, and behaviors. [name] noted, she stuck the sticky note with the diagram with the connection where it is easily visible.  [name] further shared, "I am not beating myself up as much as I was two weeks ago." Moreover, session focused on emotional versus physical hunger. A handout was provided and [name] was encouraged to use it between now and the next appointment to increase awareness of her hunger patterns. [name] agreed. Furthermore,  [name] was provided with an extensive list of pleasurable activities. During today's session, various activities were highlighted as options. [name] was receptive to today's session as evidenced by her smiling at the end of session and her indicating she will utilize the provided handouts.   Mental Status Examination: [name] arrived early for the appointment. She presented as appropriately dressed and groomed. [name] appeared her stated age and demonstrated adequate orientation to time, place, person, and purpose of the appointment. She also demonstrated appropriate eye contact. No psychomotor abnormalities or behavioral peculiarities noted. Her mood was sad with congruent affect. [name] became tearful at times while discussing the past two weeks. Her thought processes were logical, linear, and goal-directed. No hallucinations, delusions, bizarre thinking or behavior reported or observed. Judgment, insight, and impulse control appeared to be grossly intact. There was no evidence of paraphasias (i.e., errors in speech, gross mispronunciations, and word substitutions), repetition deficits, or disturbances in volume or prosody (i.e., rhythm and intonation). There was no evidence of attention or memory impairments. [name] denied current suicidal and homicidal ideation, intent or plan.  Structured Assessment Results:  The Patient Health Questionnaire-9 (PHQ-9) is a self-report measure that assesses symptoms and severity of depression over the course of the last two weeks. [name] obtained a score of seven suggesting mild depression. The following items were endorsed: little interest or pleasure in doing things; feeling down, depressed, or hopeless; trouble falling or staying asleep, or sleeping too much; feeling tired or having little energy; poor appetite or overeating; feeling bad about yourself; trouble concentrating; and fidgeting. [name] finds the aforementioned symptoms to be somewhat difficult.   Interventions:  Content from the last session was reviewed (  I.e., connection between our thoughts, feelings, and behaviors and "should" statements) and the PHQ-9 was administered for symptom monitoring. During the review, a handout was provided further outlining the connection between thoughts, feelings, and behaviors. Throughout today's session, empathic reflections and validation were provided. The provider also engaged in cognitive reframing to assist in increasing self-compassion. Psychoeducation regarding pleasurable activities was provided, including how it can serve as a coping skill and also help increase self-compassion. [name] was provided with an extensive lists of activities and the provider assisted her in determining some activities she could incorporate in her weekly routine. Psychoeducation regarding how engaging in pleasurable activities can reduce emotional eating was also provided.   DSM-5 Diagnosis: 311 (F32.8) Other Specified Depressive Disorder, Recurrent Brief Depression and Emotional Eating   Plan: Beadie continues to appear able and willing to participate as evidenced by engagement in reciprocal conversation, and her providing various examples when discussing emotional versus physical hunger and pleasurable activities. The provider recommended a follow up appointment in two weeks; however, the next appointment will be scheduled in three weeks per [name]'s request. She noted a desire to keep the appointment with this provider and Dr. Adair Patter on the same day. Session will focus on reviewing learned skills and working towards established treatment goals.

## 2017-09-28 ENCOUNTER — Ambulatory Visit (INDEPENDENT_AMBULATORY_CARE_PROVIDER_SITE_OTHER): Payer: Self-pay | Admitting: Psychology

## 2017-09-29 ENCOUNTER — Ambulatory Visit (INDEPENDENT_AMBULATORY_CARE_PROVIDER_SITE_OTHER): Payer: BLUE CROSS/BLUE SHIELD | Admitting: Family Medicine

## 2017-09-29 ENCOUNTER — Ambulatory Visit (INDEPENDENT_AMBULATORY_CARE_PROVIDER_SITE_OTHER): Payer: BLUE CROSS/BLUE SHIELD | Admitting: Psychology

## 2017-09-29 VITALS — BP 127/84 | HR 100 | Temp 98.4°F | Ht 64.0 in | Wt 295.0 lb

## 2017-09-29 DIAGNOSIS — F3289 Other specified depressive episodes: Secondary | ICD-10-CM

## 2017-09-29 DIAGNOSIS — Z6841 Body Mass Index (BMI) 40.0 and over, adult: Secondary | ICD-10-CM | POA: Diagnosis not present

## 2017-09-29 DIAGNOSIS — I2510 Atherosclerotic heart disease of native coronary artery without angina pectoris: Secondary | ICD-10-CM

## 2017-09-29 DIAGNOSIS — E559 Vitamin D deficiency, unspecified: Secondary | ICD-10-CM | POA: Diagnosis not present

## 2017-09-29 NOTE — Progress Notes (Signed)
Office: [phone]  /  Fax: [phone]   HPI:   Chief Complaint: OBESITY Rhonda Colon is here to discuss her progress with her obesity treatment plan. She is on the Category 3 plan and is following her eating plan approximately 65 % of the time. She states she is doing yard work for 5 hours 2 times per week. Rhonda Colon went out 5 times in the past 2 weeks and tried to make good choices. Wants to get back on track with Category 4 plan.  Her weight is 295 lb (133.8 kg) today and has had a weight loss of 1 pound over a period of 2 weeks since her last visit. She has lost 13 lbs since starting treatment with [place].  Coronary Artery Disease Rhonda Colon just followed up with Dr. Antoine Poche, needs to follow up in 1 year.  Vitamin D Deficiency Rhonda Colon has a diagnosis of vitamin D deficiency. She is currently taking prescription Vit D. She notes improving fatigue and denies nausea, vomiting or muscle weakness.  ALLERGIES: No Known Allergies  MEDICATIONS: Current Outpatient Medications on File Prior to Visit  Medication Sig Dispense Refill  . aspirin 81 MG tablet Take 81 mg by mouth daily.    [name] atorvastatin (LIPITOR) 40 MG tablet Take 1 tablet (40 mg total) by mouth daily at 6 PM. Please schedule appointment for refills. 90 tablet 3  . lisinopril (PRINIVIL,ZESTRIL) 10 MG tablet Take 1 tablet (10 mg total) by mouth daily. 90 tablet 3  . Multiple Vitamin (MULTIVITAMIN) capsule Take 1 capsule by mouth daily.    . nitroGLYCERIN (NITROSTAT) 0.4 MG SL tablet Place 1 tablet (0.4 mg total) under the tongue every 5 (five) minutes as needed for chest pain (up to 3 doses). 25 tablet 1  . omeprazole (PRILOSEC) 20 MG capsule Take 1 capsule (20 mg total) by mouth daily. 90 capsule 3  . RA KRILL OIL 500 MG CAPS Take by mouth.    . Vitamin D, Ergocalciferol, (DRISDOL) 50000 units CAPS capsule Take 1 capsule (50,000 Units total) by mouth every 7 (seven) days. 4 capsule 0   No current facility-administered medications on file  prior to visit.     PAST MEDICAL HISTORY: Past Medical History:  Diagnosis Date  . CAD (coronary artery disease)    a. 07/2011 NSTEMI/Cath: RCA 99%m, otw nl cors, EF 50%, inf hk -> RCA stented with 3.0x56mm Promus DES  . Depression   . Hyperlipidemia   . Morgagni hernia    a. Discovered incidentally on CT 07/2011 ->f/u with Dr. Lindie Spruce  . Obesity   . Sinus bradycardia    a. Nocturnal, asymptomatic  . Sun allergy   . Tobacco abuse    a. quit 07/2011    PAST SURGICAL HISTORY: Past Surgical History:  Procedure Laterality Date  . CARDIAC CATHETERIZATION     left heart, with angiogram  . heart attack  2013  . LEFT HEART CATHETERIZATION WITH CORONARY ANGIOGRAM N/A 08/10/2011   Procedure: LEFT HEART CATHETERIZATION WITH CORONARY ANGIOGRAM;  Surgeon: Kathleene Hazel, MD;  Location: [facility] CATH LAB;  Service: Cardiovascular;  Laterality: N/A;  . TONSILLECTOMY      SOCIAL HISTORY: Social History   Tobacco Use  . Smoking status: Former Smoker    Packs/day: 1.00    Years: 18.00    Pack years: 18.00    Types: Cigarettes    Last attempt to quit: 08/09/2011    Years since quitting: 6.1  . Smokeless tobacco: Never Used  . Tobacco comment: 2013  Substance Use Topics  . Alcohol use: Yes    Alcohol/week: 2.4 oz    Types: 4 Glasses of wine per week    Comment: occassional  . Drug use: No    FAMILY HISTORY: Family History  Problem Relation Age of Onset  . Coronary artery disease Mother [id number]        stent  . Heart attack Mother   . Heart disease Mother   . Hyperlipidemia Mother   . Obesity Mother   . Colon cancer Maternal Uncle   . Arrhythmia Father   . Depression Father   . Coronary artery disease Maternal Aunt 2161       (Mother's twin sister)  . Lung cancer Maternal Aunt   . Coronary artery disease Maternal Uncle 46  . Heart attack Maternal Aunt        mother's twin  . Heart attack Maternal Uncle   . Breast cancer Maternal Grandmother   . Cancer Neg Hx   . Early death Neg  Hx   . Hypertension Neg Hx   . Kidney disease Neg Hx   . Stroke Neg Hx   . Alcohol abuse Neg Hx   . Diabetes Neg Hx   . Drug abuse Neg Hx     ROS: Review of Systems  Constitutional: Positive for malaise/fatigue and weight loss.  Gastrointestinal: Negative for nausea and vomiting.  Musculoskeletal:       Negative muscle weakness    PHYSICAL EXAM: Blood pressure 127/84, pulse 100, temperature 98.4 F (36.9 C), temperature source Oral, height 5\' 4"  (1.626 m), weight 295 lb (133.8 kg), SpO2 95 %. Body mass index is 50.64 kg/m. Physical Exam  Constitutional: She is oriented to person, place, and time. She appears well-developed and well-nourished.  Cardiovascular: Normal rate.  Pulmonary/Chest: Effort normal.  Musculoskeletal: Normal range of motion.  Neurological: She is oriented to person, place, and time.  Skin: Skin is warm and dry.  Psychiatric: She has a normal mood and affect. Her behavior is normal.  Vitals reviewed.   RECENT LABS AND TESTS: BMET    Component Value Date/Time   NA 138 08/10/2017 0931   K 5.0 08/10/2017 0931   CL 103 08/10/2017 0931   CO2 27 08/10/2017 0931   GLUCOSE 103 (H) 08/10/2017 0931   BUN 9 08/10/2017 0931   CREATININE 0.72 08/10/2017 0931   CALCIUM 9.5 08/10/2017 0931   GFRNONAA 102 08/10/2017 0931   GFRAA 118 08/10/2017 0931   Lab Results  Component Value Date   HGBA1C 5.5 08/10/2017   HGBA1C 5.3 08/10/2011   Lab Results  Component Value Date   INSULIN 19.7 08/31/2017   CBC    Component Value Date/Time   WBC 8.3 08/10/2017 0931   RBC 4.92 08/10/2017 0931   HGB 14.8 08/10/2017 0931   HCT 42.6 08/10/2017 0931   PLT 315 08/10/2017 0931   MCV 86.6 08/10/2017 0931   MCH 30.1 08/10/2017 0931   MCHC 34.7 08/10/2017 0931   RDW 13.1 08/10/2017 0931   LYMPHSABS 2,805 08/10/2017 0931   MONOABS 1.3 (H) 08/09/2011 2119   EOSABS 166 08/10/2017 0931   BASOSABS 50 08/10/2017 0931   Iron/TIBC/Ferritin/ %Sat No results found for:  IRON, TIBC, FERRITIN, IRONPCTSAT Lipid Panel     Component Value Date/Time   CHOL 157 08/10/2017 0931   TRIG 81 08/10/2017 0931   HDL 61 08/10/2017 0931   CHOLHDL 2.6 08/10/2017 0931   VLDL 9 06/14/2014 0907   LDLCALC 80 08/10/2017 0931  Hepatic Function Panel     Component Value Date/Time   PROT 7.0 08/10/2017 0931   ALBUMIN 4.3 06/14/2014 0853   AST 45 (H) 08/10/2017 0931   ALT 36 (H) 08/10/2017 0931   ALKPHOS 47 06/14/2014 0853   BILITOT 0.4 08/10/2017 0931   BILIDIR 0.2 06/14/2014 0853   IBILI 0.6 06/14/2014 0853      Component Value Date/Time   TSH 1.930 08/31/2017 1051   TSH 1.83 08/10/2017 0931   TSH 1.276 08/09/2011 2119  Results for KIMBERLA, DRISKILL (MRN [id number]) as of 09/29/2017 12:10  Ref. Range 08/31/2017 10:51  Vitamin D, 25-Hydroxy Latest Ref Range: 30.0 - 100.0 ng/mL 25.5 (L)    ASSESSMENT AND PLAN: Coronary artery disease involving native coronary artery of native heart without angina pectoris  Vitamin D deficiency  Class 3 severe obesity with serious comorbidity and body mass index (BMI) of 40.0 to 44.9 in adult, unspecified obesity type [facility])  PLAN:  Coronary Artery Disease Jaleeyah agrees to continue taking Lipitor and she agrees to follow up with our clinic in 2 weeks.  Vitamin D Deficiency Rhonda Colon was informed that low vitamin D levels contributes to fatigue and are associated with obesity, breast, and colon cancer. Rhonda Colon agrees to continue taking prescription Vit D @50 ,000 IU every week, no refill needed. She will follow up for routine testing of vitamin D, at least 2-3 times per year. She was informed of the risk of over-replacement of vitamin D and agrees to not increase her dose unless she discusses this with [place] first. Rhonda Colon agrees to follow up with our clinic in 2 weeks.  We spent > than 50% of the 15 minute visit on the counseling as documented in the note.  Obesity Rhonda Colon is currently in the action stage of change. As such, her goal is to  continue with weight loss efforts She has agreed to follow the Category 4 plan Rhonda Colon has been instructed to work up to a goal of 150 minutes of combined cardio and strengthening exercise per week for weight loss and overall health benefits. We discussed the following Behavioral Modification Strategies today: increasing lean protein intake, increasing vegetables, work on meal planning and easy cooking plans, and planning for success   Rhonda Colon has agreed to follow up with our clinic in 2 weeks. She was informed of the importance of frequent follow up visits to maximize her success with intensive lifestyle modifications for her multiple health conditions.   OBESITY BEHAVIORAL INTERVENTION VISIT  Today's visit was # 4 out of 22.  Starting weight: 308 lbs Starting date: 08/31/17 Today's weight : 295 lbs Today's date: 09/29/2017 Total lbs lost to date: 13 (Patients must lose 7 lbs in the first 6 months to continue with counseling)   ASK: We discussed the diagnosis of obesity with Rhonda Colon today and Rhonda Colon agreed to give [place] permission to discuss obesity behavioral modification therapy today.  ASSESS: Rhonda Colon has the diagnosis of obesity and her BMI today is 50.61 Rhonda Colon is in the action stage of change   ADVISE: Rhonda Colon was educated on the multiple health risks of obesity as well as the benefit of weight loss to improve her health. She was advised of the need for long term treatment and the importance of lifestyle modifications.  AGREE: Multiple dietary modification options and treatment options were discussed and  Hadley agreed to the above obesity treatment plan.  Trude Mcburney, am acting as transcriptionist for Debbra Riding, MD  I have reviewed the  above documentation for accuracy and completeness, and I agree with the above. - Ilene Qua, MD

## 2017-10-11 NOTE — Progress Notes (Signed)
Office: [phone]  /  Fax: [phone]   Date: October 20, 2017 Time Seen: 10:30am Duration: 35 minutes Provider: Lawerance CruelGaytri Barker, Psy.D. Type of Session: Individual Therapy   HPI: Per the note for the initial visit with this provider on September 14, 2017, Railynn currently experiences sadness; crying spells; irritability; fidgeting; lack of energy; concentration issues; and social withdrawal, including withdrawal from doctors. However, she reported she has been focusing on her eating plan and that has helpedreduceemotional eating. Her overall focus has also improved. Moreover, Payslie shared experiencing sadness and grief related to her maternal aunt passing away at the end of February unexpectedly. Furthermore, Arzella shared she suffered a heart attack in 2013 and subsequently stopped smoking, began eating healthy, and exercising. This resulted in her losing 100 pounds. However, in 2016, Toria shared, "I gave up and the weight just piled on." Since then, she stated she gained 160 pounds.  Session Content: Session focused on the goals of decreasing symptoms of depression and increasing self-compassion. The session was initiated with the administration of the PHQ-9 and GAD-7, as well as a brief check-in. Jomaira reported, "I was anxious and hesitant for today's appointment" with Dr. Rinaldo RatelKadolph. She disclosed thoughts about canceling today's appointment with Dr. Rinaldo RatelKadolph. She further discussed having "roadblocks come up" and she "gave in." This was explored further. Aymar shared her back patio was re-done, which "disrupted" her flow. Moreover, Meri discussed concern about a family member and work stressors. Camry informed Dr. Rinaldo RatelKadolph about the aforementioned and requested medication. She indicated Dr. Rinaldo RatelKadolph will be prescribing Prozac. Regarding pleasurable activities, Shynia reported she downloaded a book, but lost focus after four days. Nevertheless, she continues to garden. Due to ongoing stressors, session  focused further on self-care and incorporation of pleasurable activities as it impacts her eating habits. A different handout was provided for pleasurable activities, and specific activities were identified for the next two weeks. Cardelia noted making a list of things she is good at was an activity she could engage in from the list. Thus, this was done in today's session. After identifying various things she was good at, Latiesha reported, "It reminded me of happy thoughts."  Session then focused on the impact of ongoing stress on eating behaviors. She reported undereating and choosing the "bad options" in the past few weeks. Nevertheless, she reported she maintained her weight.  Chakara was receptive to today's session as evidenced by her smiling and laughing when discussing pleasurable activities and developing a plan for the next two weeks.   Mental Status Examination: Semaya arrived on time for the appointment. She presented as appropriately dressed and groomed. Luvinia appeared her stated age and demonstrated adequate orientation to time, place, person, and purpose of the appointment. She also demonstrated appropriate eye contact. No psychomotor abnormalities or behavioral peculiarities noted. Her mood was sad with congruent affect. She was observed crying during the initial portion of the appointment. Her thought processes were logical, linear, and goal-directed. No hallucinations, delusions, bizarre thinking or behavior reported or observed. Judgment, insight, and impulse control appeared to be grossly intact. There was no evidence of paraphasias (i.e., errors in speech, gross mispronunciations, and word substitutions), repetition deficits, or disturbances in volume or prosody (i.e., rhythm and intonation). There was no evidence of attention or memory impairments. Narelle denied current suicidal and homicidal ideation, intent or plan.  Structured Assessments Results: The Patient Health Questionnaire-9 (PHQ-9) is a  self-report measure that assesses symptoms and severity of depression over the course of the last two  weeks. Soniyah obtained a score of 11 suggesting moderate depression. Arden finds the endorsed symptoms to be somewhat difficult. Depression screen PHQ 2/9 10/20/2017  Decreased Interest 1  Down, Depressed, Hopeless 2  PHQ - 2 Score 3  Altered sleeping 0  Tired, decreased energy 2  Change in appetite 2  Feeling bad or failure about yourself  2  Trouble concentrating 1  Moving slowly or fidgety/restless 1  Suicidal thoughts 0  PHQ-9 Score 11  Difficult doing work/chores -   The Generalized Anxiety Disorder-7 (GAD-7) is a brief self-report measure that assesses symptoms of anxiety over the course of the last two weeks. Twanisha obtained a score of 13 suggesting moderate anxiety.  GAD 7 : Generalized Anxiety Score 10/20/2017  Nervous, Anxious, on Edge 2  Control/stop worrying 2  Worry too much - different things 2  Trouble relaxing 2  Restless 2  Easily annoyed or irritable 2  Afraid - awful might happen 1  Total GAD 7 Score 13  Anxiety Difficulty Somewhat difficult   Interventions: Chavon was administered the PHQ-9 and GAD-7 for symptom monitoring. Content from the last session was reviewed. Throughout today's session, empathic reflections and validation were provided. Further psychoeducation regarding pleasurable activities was provided, and the provider assisted [place] in developing a plan for the next two weeks. Brief psychoeducation regarding the impact of stressors on eating habits was also provided. Moreover, the provider engaged in cognitive reframing as appropriate to help Windi increase self-compassion as it relates to her ability to cope with stressors.   DSM-5 Diagnosis: 311 (F32.8) Other Specified Depressive Disorder, Recurrent Brief Depression and Emotional Eating  Plan: Lashaunta continues to appear able and willing to participate as evidenced by engagement in reciprocal conversation,  and asking questions for clarification as appropriate. The next appointment will be scheduled in two weeks. The next session will focus on reviewing learned skills, and working towards established treatment goals.

## 2017-10-20 ENCOUNTER — Ambulatory Visit (INDEPENDENT_AMBULATORY_CARE_PROVIDER_SITE_OTHER): Payer: BLUE CROSS/BLUE SHIELD | Admitting: Psychology

## 2017-10-20 ENCOUNTER — Ambulatory Visit (INDEPENDENT_AMBULATORY_CARE_PROVIDER_SITE_OTHER): Payer: BLUE CROSS/BLUE SHIELD | Admitting: Family Medicine

## 2017-10-20 VITALS — BP 127/84 | HR 101 | Temp 97.9°F | Ht 64.0 in | Wt 295.0 lb

## 2017-10-20 DIAGNOSIS — Z6841 Body Mass Index (BMI) 40.0 and over, adult: Secondary | ICD-10-CM

## 2017-10-20 DIAGNOSIS — Z9189 Other specified personal risk factors, not elsewhere classified: Secondary | ICD-10-CM

## 2017-10-20 DIAGNOSIS — I2510 Atherosclerotic heart disease of native coronary artery without angina pectoris: Secondary | ICD-10-CM | POA: Diagnosis not present

## 2017-10-20 DIAGNOSIS — F3289 Other specified depressive episodes: Secondary | ICD-10-CM

## 2017-10-20 MED ORDER — FLUOXETINE HCL 20 MG PO CAPS: 20.0000 mg | ORAL_CAPSULE | Freq: Every day | ORAL | 0 refills | Status: DC

## 2017-10-20 NOTE — Progress Notes (Signed)
Office: [phone]  /  Fax: [phone]   HPI:   Chief Complaint: OBESITY Rhonda Colon is here to discuss her progress with her obesity treatment plan. She is on the Category 4 plan and is following her eating plan approximately 30 % of the time. She states she is walking and gardening for 60 minutes 5 days per week. Rhonda Colon realized she needs a structured plan to follow. Struggled with following the plan for last 3 weeks. Next few weeks is busy at work.  Her weight is 295 lb (133.8 kg) today and has had a weight loss of 3 pounds over a period of 4 weeks since her last visit. She has lost 7 lbs since starting treatment with [place].  Coronary Artery Disease Rhonda Colon is a 42 y.o. female with coronary artery disease. Rhonda Colon's blood pressure is controlled. She denies chest pain, chest pressure, or headache. She is working weight loss to help control her blood pressure with the goal of decreasing her risk of heart attack and stroke.   Depression with emotional eating behaviors Rhonda Colon reports sadness and feelings of withdrawn. Rhonda Colon struggles with emotional eating and using food for comfort to the extent that it is negatively impacting her health. She often snacks when she is not hungry. Rhonda Colon sometimes feels she is out of control and then feels guilty that she made poor food choices. She has been working on behavior modification techniques to help reduce her emotional eating and has been somewhat successful. She shows no sign of suicidal or homicidal ideations.  Depression screen [facility] 2/9 10/20/2017 09/14/2017 08/31/2017 08/02/2017  Decreased Interest 1 1 3 2   Down, Depressed, Hopeless 2 1 3 2   PHQ - 2 Score 3 2 6 4   Altered sleeping 0 1 3 2   Tired, decreased energy 2 2 3 2   Change in appetite 2 1 3 2   Feeling bad or failure about yourself  2 1 3 2   Trouble concentrating 1 1 3 2   Moving slowly or fidgety/restless 1 1 2 1   Suicidal thoughts 0 0 1 0  PHQ-9 Score 11 9 24 15   Difficult doing work/chores -  - Extremely dIfficult -    At risk for osteopenia and osteoporosis Rhonda Colon is at higher risk of osteopenia and osteoporosis due to vitamin D deficiency.   ALLERGIES: No Known Allergies  MEDICATIONS: Current Outpatient Medications on File Prior to Visit  Medication Sig Dispense Refill  . aspirin 81 MG tablet Take 81 mg by mouth daily.    [name] atorvastatin (LIPITOR) 40 MG tablet Take 1 tablet (40 mg total) by mouth daily at 6 PM. Please schedule appointment for refills. 90 tablet 3  . lisinopril (PRINIVIL,ZESTRIL) 10 MG tablet Take 1 tablet (10 mg total) by mouth daily. 90 tablet 3  . Multiple Vitamin (MULTIVITAMIN) capsule Take 1 capsule by mouth daily.    . nitroGLYCERIN (NITROSTAT) 0.4 MG SL tablet Place 1 tablet (0.4 mg total) under the tongue every 5 (five) minutes as needed for chest pain (up to 3 doses). 25 tablet 1  . omeprazole (PRILOSEC) 20 MG capsule Take 1 capsule (20 mg total) by mouth daily. 90 capsule 3  . RA KRILL OIL 500 MG CAPS Take by mouth.    . Vitamin D, Ergocalciferol, (DRISDOL) 50000 units CAPS capsule Take 1 capsule (50,000 Units total) by mouth every 7 (seven) days. 4 capsule 0   No current facility-administered medications on file prior to visit.     PAST MEDICAL HISTORY: Past Medical  History:  Diagnosis Date  . CAD (coronary artery disease)    a. 07/2011 NSTEMI/Cath: RCA 99%m, otw nl cors, EF 50%, inf hk -> RCA stented with 3.0x4mm Promus DES  . Depression   . Hyperlipidemia   . Morgagni hernia    a. Discovered incidentally on CT 07/2011 ->f/u with Dr. Lindie Spruce  . Obesity   . Sinus bradycardia    a. Nocturnal, asymptomatic  . Sun allergy   . Tobacco abuse    a. quit 07/2011    PAST SURGICAL HISTORY: Past Surgical History:  Procedure Laterality Date  . CARDIAC CATHETERIZATION     left heart, with angiogram  . heart attack  2013  . LEFT HEART CATHETERIZATION WITH CORONARY ANGIOGRAM N/A 08/10/2011   Procedure: LEFT HEART CATHETERIZATION WITH CORONARY  ANGIOGRAM;  Surgeon: Kathleene Hazel, MD;  Location: [facility] CATH LAB;  Service: Cardiovascular;  Laterality: N/A;  . TONSILLECTOMY      SOCIAL HISTORY: Social History   Tobacco Use  . Smoking status: Former Smoker    Packs/day: 1.00    Years: 18.00    Pack years: 18.00    Types: Cigarettes    Last attempt to quit: 08/09/2011    Years since quitting: 6.2  . Smokeless tobacco: Never Used  . Tobacco comment: 2013  Substance Use Topics  . Alcohol use: Yes    Alcohol/week: 2.4 oz    Types: 4 Glasses of wine per week    Comment: occassional  . Drug use: No    FAMILY HISTORY: Family History  Problem Relation Age of Onset  . Coronary artery disease Mother 22        stent  . Heart attack Mother   . Heart disease Mother   . Hyperlipidemia Mother   . Obesity Mother   . Colon cancer Maternal Uncle   . Arrhythmia Father   . Depression Father   . Coronary artery disease Maternal Aunt 37       (Mother's twin sister)  . Lung cancer Maternal Aunt   . Coronary artery disease Maternal Uncle 46  . Heart attack Maternal Aunt        mother's twin  . Heart attack Maternal Uncle   . Breast cancer Maternal Grandmother   . Cancer Neg Hx   . Early death Neg Hx   . Hypertension Neg Hx   . Kidney disease Neg Hx   . Stroke Neg Hx   . Alcohol abuse Neg Hx   . Diabetes Neg Hx   . Drug abuse Neg Hx     ROS: Review of Systems  Constitutional: Negative for weight loss.  Cardiovascular: Negative for chest pain.       Negative chest pressure  Neurological: Negative for headaches.  Psychiatric/Behavioral: Positive for depression. Negative for suicidal ideas.    PHYSICAL EXAM: Blood pressure 127/84, pulse (!) 101, temperature 97.9 F (36.6 C), temperature source Oral, height 5\' 4"  (1.626 m), weight 295 lb (133.8 kg), last menstrual period 10/07/2017, SpO2 95 %. Body mass index is 50.64 kg/m. Physical Exam  Constitutional: She is oriented to person, place, and time. She appears  well-developed and well-nourished.  Cardiovascular: Normal rate.  Pulmonary/Chest: Effort normal.  Musculoskeletal: Normal range of motion.  Neurological: She is oriented to person, place, and time.  Skin: Skin is warm and dry.  Psychiatric: She has a normal mood and affect. Her behavior is normal.  Vitals reviewed.   RECENT LABS AND TESTS: BMET    Component Value Date/Time  NA 138 08/10/2017 0931   K 5.0 08/10/2017 0931   CL 103 08/10/2017 0931   CO2 27 08/10/2017 0931   GLUCOSE 103 (H) 08/10/2017 0931   BUN 9 08/10/2017 0931   CREATININE 0.72 08/10/2017 0931   CALCIUM 9.5 08/10/2017 0931   GFRNONAA 102 08/10/2017 0931   GFRAA 118 08/10/2017 0931   Lab Results  Component Value Date   HGBA1C 5.5 08/10/2017   HGBA1C 5.3 08/10/2011   Lab Results  Component Value Date   INSULIN 19.7 08/31/2017   CBC    Component Value Date/Time   WBC 8.3 08/10/2017 0931   RBC 4.92 08/10/2017 0931   HGB 14.8 08/10/2017 0931   HCT 42.6 08/10/2017 0931   PLT 315 08/10/2017 0931   MCV 86.6 08/10/2017 0931   MCH 30.1 08/10/2017 0931   MCHC 34.7 08/10/2017 0931   RDW 13.1 08/10/2017 0931   LYMPHSABS 2,805 08/10/2017 0931   MONOABS 1.3 (H) 08/09/2011 2119   EOSABS 166 08/10/2017 0931   BASOSABS 50 08/10/2017 0931   Iron/TIBC/Ferritin/ %Sat No results found for: IRON, TIBC, FERRITIN, IRONPCTSAT Lipid Panel     Component Value Date/Time   CHOL 157 08/10/2017 0931   TRIG 81 08/10/2017 0931   HDL 61 08/10/2017 0931   CHOLHDL 2.6 08/10/2017 0931   VLDL 9 06/14/2014 0907   LDLCALC 80 08/10/2017 0931   Hepatic Function Panel     Component Value Date/Time   PROT 7.0 08/10/2017 0931   ALBUMIN 4.3 06/14/2014 0853   AST 45 (H) 08/10/2017 0931   ALT 36 (H) 08/10/2017 0931   ALKPHOS 47 06/14/2014 0853   BILITOT 0.4 08/10/2017 0931   BILIDIR 0.2 06/14/2014 0853   IBILI 0.6 06/14/2014 0853      Component Value Date/Time   TSH 1.930 08/31/2017 1051   TSH 1.83 08/10/2017 0931    TSH 1.276 08/09/2011 2119    ASSESSMENT AND PLAN: Coronary artery disease involving native coronary artery of native heart without angina pectoris  Other depression - with emotional eaitng - Plan: FLUoxetine (PROZAC) 20 MG capsule  At risk for osteoporosis  Class 3 severe obesity with serious comorbidity and body mass index (BMI) of 50.0 to 59.9 in adult, unspecified obesity type (HCC)  PLAN:  Coronary Artery Disease We discussed sodium restriction, working on healthy weight loss, and a regular exercise program as the means to achieve improved blood pressure control. Derisha agreed with this plan and agreed to follow up as directed. We will continue to monitor her blood pressure as well as her progress with the above lifestyle modifications. She will continue her current medications and will watch for signs of hypotension as she continues her lifestyle modifications. Rhonda Colon agrees to follow up with our clinic in 2 weeks.  Depression with Emotional Eating Behaviors We discussed behavior modification techniques today to help Rhonda Colon deal with her emotional eating and depression. Rhonda Colon agrees to start Prozac 20 mg PO q AM #30 with no refills. Rhonda Colon agrees to follow up with our clinic in 2 weeks.  At risk for osteopenia and osteoporosis Rhonda Colon is at risk for osteopenia and osteoporsis due to her vitamin D deficiency. She was encouraged to take her vitamin D and follow her higher calcium diet and increase strengthening exercise to help strengthen her bones and decrease her risk of osteopenia and osteoporosis.  Obesity Rhonda Colon is currently in the action stage of change. As such, her goal is to continue with weight loss efforts She has agreed to follow  the Category 4 plan Rhonda Colon has been instructed to work up to a goal of 150 minutes of combined cardio and strengthening exercise per week for weight loss and overall health benefits. We discussed the following Behavioral Modification Strategies today:  increasing lean protein intake, increasing vegetables, work on meal planning and easy cooking plans, better snacking choices, and planning for success   Rhonda Colon has agreed to follow up with our clinic in 2 weeks. She was informed of the importance of frequent follow up visits to maximize her success with intensive lifestyle modifications for her multiple health conditions.   OBESITY BEHAVIORAL INTERVENTION VISIT  Today's visit was # 6 out of 22.  Starting weight: 308 lbs Starting date: 08/31/17 Today's weight : 295 lbs Today's date: 10/20/2017 Total lbs lost to date: 13    ASK: We discussed the diagnosis of obesity with Rhonda Colon today and Rhonda Colon agreed to give [place] permission to discuss obesity behavioral modification therapy today.  ASSESS: Rhonda Colon has the diagnosis of obesity and her BMI today is 50.61 Rhonda Colon is in the action stage of change   ADVISE: Rhonda Colon was educated on the multiple health risks of obesity as well as the benefit of weight loss to improve her health. She was advised of the need for long term treatment and the importance of lifestyle modifications.  AGREE: Multiple dietary modification options and treatment options were discussed and  Rhonda Colon agreed to the above obesity treatment plan.  I, Burt Knack, am acting as transcriptionist for Debbra Riding, MD  I have reviewed the above documentation for accuracy and completeness, and I agree with the above. - Debbra Riding, MD

## 2017-10-21 NOTE — Progress Notes (Unsigned)
  Office: [phone]  /  Fax: [phone]  Date: November 04, 2017 Time Seen:*** Duration:*** Provider: Lawerance CruelGaytri Celina Shiley, Psy.D. Type of Session: Individual Therapy   HPI: Per the note for the initial visit with this provider on September 14, 2017, Rhonda Colon currently experiences sadness; crying spells; irritability; fidgeting; lack of energy; concentration issues; and social withdrawal, including withdrawal from doctors. However, she reported she has been focusing on her eating plan and that has helpedreduceemotional eating. Her overall focus has also improved. Moreover, Rhonda Colon shared experiencing sadness and grief related to her maternal aunt passing away at the end of February unexpectedly.Furthermore,Rhonda Colon shared she suffered a heart attack in 2013 and subsequently stopped smoking, began eating healthy, and exercising. This resulted in her losing 100 pounds. However, in 2016, Rhonda Colon shared, "I gave up and the weight just piled on." Since then, she stated she gained 160 pounds.  Session Content: Session focused on the goals ofdecreasing symptoms of depression and increasing self-compassion. The session was initiated with the administration of the PHQ-9 and GAD-7, as well as a a brief check-in.   Rhonda Colon was receptive to today's session as evidenced by ***.  Mental Status Examination: Rhonda Colon arrived on time for the appointment. She presented as appropriately dressed and groomed. Rhonda Colon appeared her stated age and demonstrated adequate orientation to time, place, person, and purpose of the appointment. She also demonstrated appropriate eye contact. No psychomotor abnormalities or behavioral peculiarities noted. Her mood was *** with congruent affect. Her thought processes were logical, linear, and goal-directed. No hallucinations, delusions, bizarre thinking or behavior reported or observed. Judgment, insight, and impulse control appeared to be grossly intact. There was no evidence of paraphasias (i.e., errors  in speech, gross mispronunciations, and word substitutions), repetition deficits, or disturbances in volume or prosody (i.e., rhythm and intonation). There was no evidence of attention or memory impairments. James denied current suicidal and homicidal ideation, intent or plan.  Interventions: Rhonda Colon was administered the PHQ-9 and GAD-7 for symptom monitoring. Content from the last session was reviewed (I.e., pleasurable activities). Throughout today's session, empathic reflections and validation were provided. Psychoeducation regarding *** was provided and *** [insert other interventions].   DSM-5 Diagnosis: ***  Plan: Rhonda Colon continues to appear able and willing to participate as evidenced by engagement in reciprocal conversation, and asking questions for clarification as appropriate.*** The next appointment will be scheduled in ***. The next session will focus on reviewing learned skills, and working towards established treatment goals.

## 2017-11-01 ENCOUNTER — Other Ambulatory Visit (INDEPENDENT_AMBULATORY_CARE_PROVIDER_SITE_OTHER): Payer: Self-pay | Admitting: Family Medicine

## 2017-11-01 DIAGNOSIS — F3289 Other specified depressive episodes: Secondary | ICD-10-CM

## 2017-11-04 ENCOUNTER — Encounter (INDEPENDENT_AMBULATORY_CARE_PROVIDER_SITE_OTHER): Payer: Self-pay

## 2017-11-04 ENCOUNTER — Ambulatory Visit (INDEPENDENT_AMBULATORY_CARE_PROVIDER_SITE_OTHER): Payer: Self-pay | Admitting: Psychology

## 2017-11-04 ENCOUNTER — Ambulatory Visit (INDEPENDENT_AMBULATORY_CARE_PROVIDER_SITE_OTHER): Payer: Self-pay | Admitting: Family Medicine

## 2017-11-17 ENCOUNTER — Other Ambulatory Visit (INDEPENDENT_AMBULATORY_CARE_PROVIDER_SITE_OTHER): Payer: Self-pay | Admitting: Family Medicine

## 2017-11-17 DIAGNOSIS — F3289 Other specified depressive episodes: Secondary | ICD-10-CM

## 2017-11-18 ENCOUNTER — Other Ambulatory Visit (INDEPENDENT_AMBULATORY_CARE_PROVIDER_SITE_OTHER): Payer: Self-pay | Admitting: Family Medicine

## 2017-11-18 DIAGNOSIS — F3289 Other specified depressive episodes: Secondary | ICD-10-CM

## 2017-11-23 ENCOUNTER — Encounter (INDEPENDENT_AMBULATORY_CARE_PROVIDER_SITE_OTHER): Payer: Self-pay

## 2018-01-04 ENCOUNTER — Ambulatory Visit: Payer: BLUE CROSS/BLUE SHIELD | Admitting: Internal Medicine

## 2018-01-04 ENCOUNTER — Encounter: Payer: Self-pay | Admitting: Internal Medicine

## 2018-01-04 VITALS — BP 140/80 | HR 104 | Ht 64.0 in | Wt 290.0 lb

## 2018-01-04 DIAGNOSIS — Z6841 Body Mass Index (BMI) 40.0 and over, adult: Secondary | ICD-10-CM | POA: Diagnosis not present

## 2018-01-04 DIAGNOSIS — Z23 Encounter for immunization: Secondary | ICD-10-CM | POA: Diagnosis not present

## 2018-01-04 DIAGNOSIS — F3289 Other specified depressive episodes: Secondary | ICD-10-CM | POA: Diagnosis not present

## 2018-01-04 MED ORDER — ALPRAZOLAM 1 MG PO TABS: 1.0000 mg | ORAL_TABLET | Freq: Two times a day (BID) | ORAL | 0 refills | Status: DC | PRN

## 2018-01-04 MED ORDER — FLUOXETINE HCL 20 MG PO CAPS: 20.0000 mg | ORAL_CAPSULE | Freq: Every day | ORAL | 2 refills | Status: DC

## 2018-01-04 NOTE — Progress Notes (Deleted)
   Subjective:    Patient ID: Rhonda Colon, female    DOB: 29-Dec-1975, 42 y.o.   MRN: [id number]  HPI   41 year old Female with CAD     Review of Systems     Objective:   Physical Exam        Assessment & Plan:

## 2018-01-04 NOTE — Progress Notes (Addendum)
   Subjective:    Patient ID: Rhonda Colon, female    DOB: 08-11-1975, 42 y.o.   MRN: [id number]  HPI 42 year old Female still grieving loss of maternal aunt. Has not been going to obesity clinic recently. Initially enjoyed going. Found it helpful especially Dr. Dewaine Conger, psychologist but has not been back since late July. Not currently motivated. Is depressed.Apparently has emotional eating per Dr. Mickey Farber notes. PHQ score 11 in late July.Was started on Prozac 20 mg daily #30 there in late July but says they were unwilling to refill that Rx unless she returned. She found herself down and not motivated to diet and did not go back. Weaned herself off prozac but thought it helped initially. Thinks she will go back there eventually.    Review of Systems tearful at times. Affect sad     Objective:   Physical Exam  Spent 20 minutes speaking with her about grief,sadness, lack of motivation to diet.Still struggling with aunt's death. They were very close. Family supportive. Significant other supportive.      Assessment & Plan:  Depression  Emotional eating  Hx CAD-non-STEMI 2013 with 90% RCA treated with DES  Plan: Encouraged pt to retrun to obesity clinic and try to walk some daily. Restart Prozac 20 mg daily.Xanax 1 mg bid prn anxiety and insomnia. Follow up in 8 weeks.Flu vaccine given

## 2018-01-09 NOTE — Patient Instructions (Addendum)
Restart Prozac 20 mg daily RTC in 8 weeks. Encourage return to obesity clinic.Flu vaccine given.

## 2018-06-09 ENCOUNTER — Ambulatory Visit: Payer: Self-pay | Admitting: Internal Medicine

## 2018-06-25 ENCOUNTER — Other Ambulatory Visit: Payer: Self-pay | Admitting: Internal Medicine

## 2018-06-25 DIAGNOSIS — F3289 Other specified depressive episodes: Secondary | ICD-10-CM

## 2018-06-27 ENCOUNTER — Other Ambulatory Visit: Payer: Self-pay

## 2018-06-27 DIAGNOSIS — F3289 Other specified depressive episodes: Secondary | ICD-10-CM

## 2018-06-27 MED ORDER — ALPRAZOLAM 1 MG PO TABS: 1.0000 mg | ORAL_TABLET | Freq: Two times a day (BID) | ORAL | 2 refills | Status: DC | PRN

## 2018-06-27 MED ORDER — FLUOXETINE HCL 20 MG PO CAPS: 20.0000 mg | ORAL_CAPSULE | Freq: Every day | ORAL | 0 refills | Status: DC

## 2018-06-27 NOTE — Telephone Encounter (Signed)
Have refilled Xanax- needs CPE after May 6th

## 2018-09-01 ENCOUNTER — Other Ambulatory Visit: Payer: Self-pay

## 2018-09-01 MED ORDER — OMEPRAZOLE 20 MG PO CPDR: 20.0000 mg | DELAYED_RELEASE_CAPSULE | Freq: Every day | ORAL | 3 refills | Status: DC

## 2018-09-11 ENCOUNTER — Other Ambulatory Visit: Payer: Self-pay | Admitting: Cardiology

## 2018-09-11 DIAGNOSIS — I10 Essential (primary) hypertension: Secondary | ICD-10-CM

## 2018-09-16 ENCOUNTER — Other Ambulatory Visit: Payer: Self-pay | Admitting: Internal Medicine

## 2018-09-16 DIAGNOSIS — F3289 Other specified depressive episodes: Secondary | ICD-10-CM

## 2018-10-24 DIAGNOSIS — Z20828 Contact with and (suspected) exposure to other viral communicable diseases: Secondary | ICD-10-CM | POA: Diagnosis not present

## 2018-11-09 DIAGNOSIS — Z1231 Encounter for screening mammogram for malignant neoplasm of breast: Secondary | ICD-10-CM | POA: Diagnosis not present

## 2018-11-22 ENCOUNTER — Ambulatory Visit: Payer: BLUE CROSS/BLUE SHIELD | Admitting: Cardiology

## 2018-12-12 ENCOUNTER — Other Ambulatory Visit: Payer: Self-pay | Admitting: Cardiology

## 2018-12-12 ENCOUNTER — Other Ambulatory Visit: Payer: Self-pay | Admitting: Internal Medicine

## 2018-12-12 DIAGNOSIS — F3289 Other specified depressive episodes: Secondary | ICD-10-CM

## 2018-12-12 DIAGNOSIS — I10 Essential (primary) hypertension: Secondary | ICD-10-CM

## 2018-12-12 NOTE — Telephone Encounter (Signed)
Pt due for CPE October. Please book before refilling

## 2018-12-12 NOTE — Telephone Encounter (Signed)
Left detailed message.  To call back to book cpe.

## 2018-12-19 ENCOUNTER — Telehealth: Payer: Self-pay | Admitting: Internal Medicine

## 2018-12-19 DIAGNOSIS — F3289 Other specified depressive episodes: Secondary | ICD-10-CM

## 2018-12-19 NOTE — Telephone Encounter (Signed)
Fax received from [employer]. [address], [id number] Atorvastatin 40mg  Tab which was last filled 07/21/2018 Fluoxetine 20mg  Cap which was last filled 09/26/2018 Last OV: 01/04/18 Last CPE: 08/02/17 Next CPE: Overdue, Tried to call pt to schedule but had to leave a message

## 2018-12-19 NOTE — Addendum Note (Signed)
Addended by: Mady Haagensen on: 12/19/2018 12:06 PM   Modules accepted: Orders

## 2018-12-21 ENCOUNTER — Other Ambulatory Visit: Payer: Self-pay | Admitting: Internal Medicine

## 2018-12-21 DIAGNOSIS — F3289 Other specified depressive episodes: Secondary | ICD-10-CM

## 2019-01-24 DIAGNOSIS — Z20828 Contact with and (suspected) exposure to other viral communicable diseases: Secondary | ICD-10-CM | POA: Diagnosis not present

## 2019-01-24 DIAGNOSIS — Z03818 Encounter for observation for suspected exposure to other biological agents ruled out: Secondary | ICD-10-CM | POA: Diagnosis not present

## 2019-01-31 DIAGNOSIS — Z20828 Contact with and (suspected) exposure to other viral communicable diseases: Secondary | ICD-10-CM | POA: Diagnosis not present

## 2019-03-28 ENCOUNTER — Other Ambulatory Visit: Payer: Self-pay

## 2019-03-28 ENCOUNTER — Other Ambulatory Visit: Payer: BLUE CROSS/BLUE SHIELD | Admitting: Internal Medicine

## 2019-03-28 DIAGNOSIS — Z0000 Encounter for general adult medical examination without abnormal findings: Secondary | ICD-10-CM

## 2019-03-28 DIAGNOSIS — Z87891 Personal history of nicotine dependence: Secondary | ICD-10-CM

## 2019-03-28 DIAGNOSIS — R7302 Impaired glucose tolerance (oral): Secondary | ICD-10-CM

## 2019-03-28 DIAGNOSIS — F32A Depression, unspecified: Secondary | ICD-10-CM

## 2019-03-28 DIAGNOSIS — I10 Essential (primary) hypertension: Secondary | ICD-10-CM | POA: Diagnosis not present

## 2019-03-28 DIAGNOSIS — N912 Amenorrhea, unspecified: Secondary | ICD-10-CM | POA: Diagnosis not present

## 2019-03-28 DIAGNOSIS — E785 Hyperlipidemia, unspecified: Secondary | ICD-10-CM

## 2019-03-28 DIAGNOSIS — F329 Major depressive disorder, single episode, unspecified: Secondary | ICD-10-CM

## 2019-03-28 DIAGNOSIS — Z9189 Other specified personal risk factors, not elsewhere classified: Secondary | ICD-10-CM

## 2019-04-03 ENCOUNTER — Other Ambulatory Visit: Payer: Self-pay

## 2019-04-03 ENCOUNTER — Encounter: Payer: Self-pay | Admitting: Internal Medicine

## 2019-04-03 ENCOUNTER — Ambulatory Visit (INDEPENDENT_AMBULATORY_CARE_PROVIDER_SITE_OTHER): Payer: 59 | Admitting: Internal Medicine

## 2019-04-03 VITALS — BP 140/90 | HR 102 | Ht 68.0 in | Wt 300.0 lb

## 2019-04-03 DIAGNOSIS — Z0000 Encounter for general adult medical examination without abnormal findings: Secondary | ICD-10-CM | POA: Diagnosis not present

## 2019-04-03 DIAGNOSIS — E559 Vitamin D deficiency, unspecified: Secondary | ICD-10-CM

## 2019-04-03 DIAGNOSIS — N912 Amenorrhea, unspecified: Secondary | ICD-10-CM

## 2019-04-03 DIAGNOSIS — Z6841 Body Mass Index (BMI) 40.0 and over, adult: Secondary | ICD-10-CM

## 2019-04-03 DIAGNOSIS — F3289 Other specified depressive episodes: Secondary | ICD-10-CM | POA: Diagnosis not present

## 2019-04-03 DIAGNOSIS — I10 Essential (primary) hypertension: Secondary | ICD-10-CM

## 2019-04-03 DIAGNOSIS — I2510 Atherosclerotic heart disease of native coronary artery without angina pectoris: Secondary | ICD-10-CM

## 2019-04-03 LAB — POCT URINALYSIS DIPSTICK
Appearance: NEGATIVE
Bilirubin, UA: NEGATIVE
Glucose, UA: NEGATIVE
Ketones, UA: NEGATIVE
Leukocytes, UA: NEGATIVE
Nitrite, UA: NEGATIVE
Odor: NEGATIVE
Protein, UA: NEGATIVE
Spec Grav, UA: 1.015 (ref 1.010–1.025)
Urobilinogen, UA: 0.2 E.U./dL
pH, UA: 6.5 (ref 5.0–8.0)

## 2019-04-03 MED ORDER — CETIRIZINE HCL 10 MG PO TABS: 10.0000 mg | ORAL_TABLET | Freq: Every day | ORAL | 3 refills | Status: DC

## 2019-04-03 MED ORDER — NITROGLYCERIN 0.4 MG SL SUBL: 0.4000 mg | SUBLINGUAL_TABLET | SUBLINGUAL | 1 refills | Status: DC | PRN

## 2019-04-03 MED ORDER — LISINOPRIL 10 MG PO TABS: 10.0000 mg | ORAL_TABLET | Freq: Every day | ORAL | 3 refills | Status: DC

## 2019-04-03 MED ORDER — BUPROPION HCL ER (XL) 150 MG PO TB24: 150.0000 mg | ORAL_TABLET | Freq: Every day | ORAL | 1 refills | Status: DC

## 2019-04-03 MED ORDER — ATORVASTATIN CALCIUM 40 MG PO TABS: 40.0000 mg | ORAL_TABLET | Freq: Every evening | ORAL | 3 refills | Status: DC

## 2019-04-03 MED ORDER — FLUOXETINE HCL 20 MG PO CAPS: 20.0000 mg | ORAL_CAPSULE | Freq: Every day | ORAL | 3 refills | Status: DC

## 2019-04-03 MED ORDER — OMEPRAZOLE 20 MG PO CPDR: 20.0000 mg | DELAYED_RELEASE_CAPSULE | Freq: Every day | ORAL | 3 refills | Status: DC

## 2019-04-03 NOTE — Progress Notes (Signed)
Subjective:    Patient ID: Rhonda Colon, female    DOB: 1975/09/10, 44 y.o.   MRN: [id number]  HPI 44 year old Female for health maintenance exam and evaluation of medical issues.  Patient first presented here in May 2019.  Patient had a non-STEMI MI in 2013 with 90% RCA.  RCA was stented with a drug-eluting stent.  She has a history of hyperlipidemia. 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  Was seen by Dr. Hulen Skains but did not have surgery.    History of tonsillectomy.  Fractured wrist 2002.  Fractured spine 1992.  Fractured foot 1988.  History of solar urticaria.  History of allergic rhinitis.  Tetanus immunization done in 2016.  History of anxiety depression treated with Zoloft.  History of GE reflux treated with Prilosec.  At one point she lost 100 pounds in was noted to weigh 158 pounds when she saw Dr. Percival Spanish in March 2014.  She was exercising routinely not smoking and was eating right.  Saw psychologist,Dr. Mallie Mussel, at [facility] in 2019 and found it to be helpful but has not been back to weight loss clinic since 2019.  Was started on Prozac at that time.  Her maternal aunt died with heart issues and lung cancer in 2019 and this was devastating for patient.  Social history.  She is a [employer].  Social alcohol consumption.  Non-smoker but formerly smoked.  She is single.  Family history: Both parents with history of MI.  Heart disease also with mother's twin sister.  History of fatty liver infiltration based on ultrasound and mild elevation of liver functions in 2019  History of non-STEMI in 2013 with 90% RCA.  RCA was stented with a drug-eluting stent.  History of hyperlipidemia.  History of tonsillectomy.  History of anxiety depression treated with Zoloft.  History of solar urticaria cough and dermatitis of the legs it looks like  folliculitis     Review of Systems may be perimenopausal having ammenorrhea and erratic menses sometimes with heavy flow.  Sees GYN.  Depression may be worse in terms of sadness, irritability,     Objective:   Physical Exam Blood pressure 140/90, BMI 45.61, pulse 102 pulse oximetry 97% weight 300 pounds.  Height 5 feet 8 inches.  Skin warm and dry.  Nodes none.  TMs are clear.  Neck is supple without JVD thyromegaly or carotid bruits.  Chest clear to auscultation.  Breast without masses.  Cardiac exam regular rate and rhythm normal S1 and S2.  Trace lower extremity edema.  Neuro intact without focal deficits.  Affect is flat consistent with depression.       Assessment & Plan:  Morbid obesity-encourage patient to consider returning to weight loss clinic.  Currently gyms are not a good idea due to COVID-19 pandemic.  Depression-would benefit from counseling.  Is on Prozac 20 mg daily.  Add Wellbutrin 150 mg XL daily.  History of coronary artery disease status post MI in 2013 with drug-eluting stent placed in RCA.  Strong family history of MI in both parents.  Is on statin medication.  Also takes 81 mg of aspirin daily.  Elevated liver functions consistent with fatty liver  History of GE reflux treated with PPI.  Hypertension treated with Zestril 10 mg daily.  Allergic rhinitis treated with Zyrtec.  Hyperlipidemia-continue statin medication-LDL was 103 on recent lab work.  History of mixed hyperlipidemia in 2013 total cholesterol was 227 with an LDL of 148 and triglycerides 181  Plan: We can increase Wellbutrin to 300 mg XL if necessary.  Take Prozac 20 mg daily.  Follow-up in a few weeks.

## 2019-04-04 LAB — HEMOGLOBIN A1C
Hgb A1c MFr Bld: 5.6 % of total Hgb (ref <5.7)
Mean Plasma Glucose: 114 (calc)
eAG (mmol/L): 6.3 (calc)

## 2019-04-04 LAB — CBC WITH DIFFERENTIAL/PLATELET
Absolute Monocytes: 758 cells/uL (ref 200–950)
Basophils Absolute: 67 cells/uL (ref 0–200)
Basophils Relative: 0.7 %
Eosinophils Absolute: 125 cells/uL (ref 15–500)
Eosinophils Relative: 1.3 %
HCT: 43.7 % (ref 35.0–45.0)
Hemoglobin: 15 g/dL (ref 11.7–15.5)
Lymphs Abs: 3312 cells/uL (ref 850–3900)
MCH: 30.7 pg (ref 27.0–33.0)
MCHC: 34.3 g/dL (ref 32.0–36.0)
MCV: 89.4 fL (ref 80.0–100.0)
MPV: 11.5 fL (ref 7.5–12.5)
Monocytes Relative: 7.9 %
Neutro Abs: 5338 cells/uL (ref 1500–7800)
Neutrophils Relative %: 55.6 %
Platelets: 320 10*3/uL (ref 140–400)
RBC: 4.89 10*6/uL (ref 3.80–5.10)
RDW: 13.2 % (ref 11.0–15.0)
Total Lymphocyte: 34.5 %
WBC: 9.6 10*3/uL (ref 3.8–10.8)

## 2019-04-04 LAB — COMPLETE METABOLIC PANEL WITH GFR
AG Ratio: 1.5 (calc) (ref 1.0–2.5)
ALT: 58 U/L — ABNORMAL HIGH (ref 6–29)
AST: 41 U/L — ABNORMAL HIGH (ref 10–35)
Albumin: 4 g/dL (ref 3.6–5.1)
Alkaline phosphatase (APISO): 72 U/L (ref 31–125)
BUN: 8 mg/dL (ref 7–25)
CO2: 27 mmol/L (ref 20–32)
Calcium: 9.3 mg/dL (ref 8.6–10.2)
Chloride: 101 mmol/L (ref 98–110)
Creat: 0.68 mg/dL (ref 0.50–1.10)
GFR, Est African American: 122 mL/min/{1.73_m2} (ref >=60)
GFR, Est Non African American: 106 mL/min/{1.73_m2} (ref >=60)
Globulin: 2.7 g/dL (calc) (ref 1.9–3.7)
Glucose, Bld: 101 mg/dL — ABNORMAL HIGH (ref 65–99)
Potassium: 4.6 mmol/L (ref 3.5–5.3)
Sodium: 138 mmol/L (ref 135–146)
Total Bilirubin: 0.4 mg/dL (ref 0.2–1.2)
Total Protein: 6.7 g/dL (ref 6.1–8.1)

## 2019-04-04 LAB — LIPID PANEL
Cholesterol: 175 mg/dL (ref <200)
HDL: 53 mg/dL (ref >=50)
LDL Cholesterol (Calc): 103 mg/dL (calc) — ABNORMAL HIGH
Non-HDL Cholesterol (Calc): 122 mg/dL (calc) (ref <130)
Total CHOL/HDL Ratio: 3.3 (calc) (ref <5.0)
Triglycerides: 96 mg/dL (ref <150)

## 2019-04-04 LAB — TEST AUTHORIZATION

## 2019-04-04 LAB — VITAMIN D 25 HYDROXY (VIT D DEFICIENCY, FRACTURES): Vit D, 25-Hydroxy: 32 ng/mL (ref 30–100)

## 2019-04-04 LAB — TSH: TSH: 1.66 mIU/L

## 2019-04-04 LAB — FOLLICLE STIMULATING HORMONE: FSH: 4.3 m[IU]/mL

## 2019-04-06 NOTE — Progress Notes (Signed)
Cardiology Office Note   Date:  04/07/2019   ID:  LAKHIA GENGLER, DOB 08-03-1975, MRN [id number]  PCP:  Margaree Mackintosh, MD  Cardiologist:   Rollene Rotunda, MD    Chief Complaint  Patient presents with  . Coronary Artery Disease     History of Present Illness: Rhonda Colon is a 44 y.o. female who presents for followup after a previous MI . Since I last saw her she has had no new cardiovascular complaints. She will get short of breath with moderate activity and does have to sleep with a little incline now when she did previously. He has had some weight gain. She is still being managed actively for depression. She is not been physically active beyond her chores of daily living. With this she is not getting any of the chest discomfort that she had at the time of her infarct. She is not describing classic PND or orthopnea. She said no palpitations, presyncope or syncope.   Past Medical History:  Diagnosis Date  . CAD (coronary artery disease)    a. 07/2011 NSTEMI/Cath: RCA 99%m, otw nl cors, EF 50%, inf hk -> RCA stented with 3.0x12mm Promus DES  . Depression   . Hyperlipidemia   . Morgagni hernia    a. Discovered incidentally on CT 07/2011 ->f/u with Dr. Lindie Spruce  . Obesity   . Sinus bradycardia    a. Nocturnal, asymptomatic  . Sun allergy   . Tobacco abuse    a. quit 07/2011    Past Surgical History:  Procedure Laterality Date  . CARDIAC CATHETERIZATION     left heart, with angiogram  . heart attack  2013  . LEFT HEART CATHETERIZATION WITH CORONARY ANGIOGRAM N/A 08/10/2011   Procedure: LEFT HEART CATHETERIZATION WITH CORONARY ANGIOGRAM;  Surgeon: Kathleene Hazel, MD;  Location: [facility] CATH LAB;  Service: Cardiovascular;  Laterality: N/A;  . TONSILLECTOMY       Current Outpatient Medications  Medication Sig Dispense Refill  . ALPRAZolam (XANAX) 1 MG tablet Take 1 tablet (1 mg total) by mouth 2 (two) times daily as needed for anxiety. 60 tablet 2  . aspirin 81 MG tablet  Take 81 mg by mouth daily.    [name] buPROPion (WELLBUTRIN XL) 150 MG 24 hr tablet Take 1 tablet (150 mg total) by mouth daily. 30 tablet 1  . cetirizine (ZYRTEC) 10 MG tablet Take 1 tablet (10 mg total) by mouth daily. 90 tablet 3  . FLUoxetine (PROZAC) 20 MG capsule Take 1 capsule (20 mg total) by mouth daily. 90 capsule 3  . lisinopril (ZESTRIL) 10 MG tablet Take 1 tablet (10 mg total) by mouth daily. NEED OV. 90 tablet 3  . Multiple Vitamin (MULTIVITAMIN) capsule Take 1 capsule by mouth daily.    . nitroGLYCERIN (NITROSTAT) 0.4 MG SL tablet Place 1 tablet (0.4 mg total) under the tongue every 5 (five) minutes as needed for chest pain (up to 3 doses). 25 tablet 1  . omeprazole (PRILOSEC) 20 MG capsule Take 1 capsule (20 mg total) by mouth daily. 90 capsule 3  . RA KRILL OIL 500 MG CAPS Take by mouth.    . rosuvastatin (CRESTOR) 20 MG tablet Take 1 tablet (20 mg total) by mouth daily. 90 tablet 3   No current facility-administered medications for this visit.    Allergies:   Patient has no known allergies.    ROS:  Please see the history of present illness.   Otherwise, review of systems are  positive for none.   All other systems are reviewed and negative.    PHYSICAL EXAM: VS:  BP (!) 129/91   Pulse 86   Temp (!) 97.2 F (36.2 C)   Ht 5\' 8"  (1.727 m)   Wt (!) 314 lb (142.4 kg)   LMP 04/03/2019 Comment: Patient is currently menstruating  SpO2 95%   BMI 47.74 kg/m  , BMI Body mass index is 47.74 kg/m. GENERAL:  Well appearing NECK:  No jugular venous distention, waveform within normal limits, carotid upstroke brisk and symmetric, no bruits, no thyromegaly LUNGS:  Clear to auscultation bilaterally CHEST:  Unremarkable HEART:  PMI not displaced or sustained,S1 and S2 within normal limits, no S3, no S4, no clicks, no rubs, no murmurs ABD:  Flat, positive bowel sounds normal in frequency in pitch, no bruits, no rebound, no guarding, no midline pulsatile mass, no hepatomegaly, no  splenomegaly EXT:  2 plus pulses throughout, no edema, no cyanosis no clubbing   EKG:  EKG is ordered today. The ekg ordered today demonstrates normal sinus rhythm, rate 86, axis is normal limits, intervals within normal limits, no acute ST-T wave changes.   Recent Labs: 03/28/2019: ALT 58; BUN 8; Creat 0.68; Hemoglobin 15.0; Platelets 320; Potassium 4.6; Sodium 138; TSH 1.66    Lipid Panel    Component Value Date/Time   CHOL 175 03/28/2019 0938   TRIG 96 03/28/2019 0938   HDL 53 03/28/2019 0938   CHOLHDL 3.3 03/28/2019 0938   VLDL 9 06/14/2014 0907   LDLCALC 103 (H) 03/28/2019 0938      Wt Readings from Last 3 Encounters:  04/07/19 (!) 314 lb (142.4 kg)  04/03/19 300 lb (136.1 kg)  01/04/18 290 lb (131.5 kg)      Other studies Reviewed: Additional studies/ records that were reviewed today include: Labs. Review of the above records demonstrates:  Please see elsewhere in the note.     ASSESSMENT AND PLAN:  CAD:    The patient has no new sypmtoms.  No further cardiovascular testing is indicated.  We will continue with aggressive risk reduction and meds as listed.I would screen her with those stress test by at least next year's visit but if she is going to increase her physical activity anytime this summer I probably would do the testing before then. She will let me know. She needs continued aggressive risk reduction.  HL:     Her LDL was not at target. LDL is 103. I am going to switch to Crestor 20 mg daily with a lipid profile liver enzymes in 10 weeks.  Tob Abuse:    She quit smoking at the time of her MI and has never gone back.  Depression:     We talked about this for a while and she just had Wellbutrin added but has not started it and I encouraged her to follow these instructions.  Covid 19 education: She is anxious to get the vaccine we talked about when that might be available and what group she might be in.   Current medicines are reviewed at length with the  patient today.  The patient does not have concerns regarding medicines.  The following changes have been made:  no change  Labs/ tests ordered today include:   Orders Placed This Encounter  Procedures  . Lipid Profile  . Comprehensive Metabolic Panel (CMET)  . EKG 12-Lead     Disposition:   FU with me in one year.     Signed, 03/06/18  Wilson Dusenbery, MD  04/07/2019 10:45 AM    [place]

## 2019-04-07 ENCOUNTER — Ambulatory Visit: Payer: 59 | Admitting: Cardiology

## 2019-04-07 ENCOUNTER — Encounter: Payer: Self-pay | Admitting: Cardiology

## 2019-04-07 ENCOUNTER — Other Ambulatory Visit: Payer: Self-pay

## 2019-04-07 VITALS — BP 129/91 | HR 86 | Temp 97.2°F | Ht 68.0 in | Wt 314.0 lb

## 2019-04-07 DIAGNOSIS — Z720 Tobacco use: Secondary | ICD-10-CM | POA: Diagnosis not present

## 2019-04-07 DIAGNOSIS — I2510 Atherosclerotic heart disease of native coronary artery without angina pectoris: Secondary | ICD-10-CM | POA: Diagnosis not present

## 2019-04-07 DIAGNOSIS — Z7189 Other specified counseling: Secondary | ICD-10-CM

## 2019-04-07 DIAGNOSIS — E785 Hyperlipidemia, unspecified: Secondary | ICD-10-CM

## 2019-04-07 DIAGNOSIS — Z5181 Encounter for therapeutic drug level monitoring: Secondary | ICD-10-CM

## 2019-04-07 MED ORDER — ROSUVASTATIN CALCIUM 20 MG PO TABS: 20.0000 mg | ORAL_TABLET | Freq: Every day | ORAL | 3 refills | Status: DC

## 2019-04-07 NOTE — Patient Instructions (Signed)
Medication Instructions:  STOP ATORVASTATIN   START ROSUVASTATIN 20 MG DAILY   *If you need a refill on your cardiac medications before your next appointment, please call your pharmacy*  Lab Work: FASTING LP/CMET IN 10 WEEKS   If you have labs (blood work) drawn today and your tests are completely normal, you will receive your results only by: [name] MyChart Message (if you have MyChart) OR . A paper copy in the mail If you have any lab test that is abnormal or we need to change your treatment, we will call you to review the results.  Testing/Procedures: NONE  Follow-Up: At [facility], you and your health needs are our priority.  As part of our continuing mission to provide you with exceptional heart care, we have created designated Provider Care Teams.  These Care Teams include your primary Cardiologist (physician) and Advanced Practice Providers (APPs -  Physician Assistants and Nurse Practitioners) who all work together to provide you with the care you need, when you need it.  Your next appointment:   12 month(s)  You will receive a reminder letter in the mail two months in advance. If you don't receive a letter, please call our office to schedule the follow-up appointment.   The format for your next appointment:   Either In Person or Virtual  Provider:   You may see Rollene Rotunda, MD or one of the following Advanced Practice Providers on your designated Care Team:    Theodore Demark, PA-C  Joni Reining, DNP, ANP  Cadence Fransico Michael, NP

## 2019-04-21 ENCOUNTER — Encounter: Payer: Self-pay | Admitting: Obstetrics and Gynecology

## 2019-04-21 ENCOUNTER — Ambulatory Visit: Payer: 59 | Admitting: Obstetrics and Gynecology

## 2019-04-21 ENCOUNTER — Other Ambulatory Visit: Payer: Self-pay

## 2019-04-21 VITALS — BP 130/80 | HR 132 | Temp 97.7°F | Ht 64.0 in | Wt 311.0 lb

## 2019-04-21 DIAGNOSIS — R4586 Emotional lability: Secondary | ICD-10-CM | POA: Diagnosis not present

## 2019-04-21 DIAGNOSIS — R6882 Decreased libido: Secondary | ICD-10-CM

## 2019-04-21 DIAGNOSIS — L680 Hirsutism: Secondary | ICD-10-CM

## 2019-04-21 DIAGNOSIS — N951 Menopausal and female climacteric states: Secondary | ICD-10-CM | POA: Diagnosis not present

## 2019-04-21 DIAGNOSIS — N926 Irregular menstruation, unspecified: Secondary | ICD-10-CM

## 2019-04-21 DIAGNOSIS — B372 Candidiasis of skin and nail: Secondary | ICD-10-CM

## 2019-04-21 DIAGNOSIS — R61 Generalized hyperhidrosis: Secondary | ICD-10-CM

## 2019-04-21 DIAGNOSIS — N946 Dysmenorrhea, unspecified: Secondary | ICD-10-CM

## 2019-04-21 DIAGNOSIS — Z3009 Encounter for other general counseling and advice on contraception: Secondary | ICD-10-CM

## 2019-04-21 DIAGNOSIS — Z8659 Personal history of other mental and behavioral disorders: Secondary | ICD-10-CM | POA: Diagnosis not present

## 2019-04-21 DIAGNOSIS — I252 Old myocardial infarction: Secondary | ICD-10-CM

## 2019-04-21 MED ORDER — NYSTATIN 100000 UNIT/GM EX CREA: 1.0000 "application " | TOPICAL_CREAM | Freq: Two times a day (BID) | CUTANEOUS | 0 refills | Status: DC

## 2019-04-21 MED ORDER — GABAPENTIN 100 MG PO CAPS: ORAL_CAPSULE | ORAL | 1 refills | Status: DC

## 2019-04-21 NOTE — Progress Notes (Signed)
44 y.o. G0P0000 Significant Other White or Caucasian Not Hispanic or Latino female here for consult for perimenopausal symptoms.  Had labs done with Dr. Lenord Fellers.   FSH from last month was 4.3. TSH was normal.   She c/o a 1.5 year h/o increased facial hair, needs to pluck chin hair daily.   Last year was a very stressful year.   She c/o some cycle irregularity. She had a MI at 60, lost 100 lbs and didn't have a cycle for a year. Since then every 6-9 months she will skip a cycle. About once every 6 months she will have a cycle about every 2 weeks. She is working on getting depression under control. She c/o a one year h/o night sweats ~4 x a week. She is soaking wet at night. She has some hot flashes during the day, more tolerable.  She has noticed vaginal dryness during intercourse over the last 2 years. Lubricant helps. No libido. Able to have an orgasm and enjoy sex at times.  Mood changes, memory loss, struggling with depression. She is on prozac, is supposed to start on Wellbutrin.   Period Cycle (Days): 29 Period Duration (Days): 4 Period Pattern: (!) Irregular Menstrual Flow: Heavy, Moderate Menstrual Control: Tampon Menstrual Control Change Freq (Hours): 1-2 Dysmenorrhea: (!) Moderate Dysmenorrhea Symptoms: Cramping, Nausea, Diarrhea, Headache  The first 2 days she changes a regular tampon in 1-2 hours. Cramps can be bad, diarrhea the first few days, typically misses work.   Patient's last menstrual period was 04/03/2019 (exact date).          Sexually active: Yes.    The current method of family planning is withdrawal.     Exercising: No.  Patient does not participate in regular exercise  Smoker:  no  Health Maintenance: Pap:  08/02/17 Normal  History of abnormal Pap:  no MMG:  11/09/18  Bi-rads 1 neg  BMD:   No  Colonoscopy: no  TDaP:  08/08/13 Gardasil: no   reports that she quit smoking about 7 years ago. Her smoking use included cigarettes. She has a 18.00 pack-year  smoking history. She has never used smokeless tobacco. She reports current alcohol use of about 4.0 standard drinks of alcohol per week. She reports that she does not use drugs. 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 CAD (coronary artery disease)    a. 07/2011 NSTEMI/Cath: RCA 99%m, otw nl cors, EF 50%, inf hk -> RCA stented with 3.0x14mm Promus DES  . Depression   . Hyperlipidemia   . Morgagni hernia    a. Discovered incidentally on CT 07/2011 ->f/u with Dr. Lindie Spruce  . Obesity   . Sinus bradycardia    a. Nocturnal, asymptomatic  . Sun allergy   . Tobacco abuse    a. quit 07/2011    Past Surgical History:  Procedure Laterality Date  . CARDIAC CATHETERIZATION     left heart, with angiogram  . heart attack  2013  . LEFT HEART CATHETERIZATION WITH CORONARY ANGIOGRAM N/A 08/10/2011   Procedure: LEFT HEART CATHETERIZATION WITH CORONARY ANGIOGRAM;  Surgeon: Kathleene Hazel, MD;  Location: [facility] CATH LAB;  Service: Cardiovascular;  Laterality: N/A;  . TONSILLECTOMY      Current Outpatient Medications  Medication Sig Dispense Refill  . ALPRAZolam (XANAX) 1 MG tablet Take 1 tablet (1 mg total) by mouth 2 (two) times daily as needed for anxiety. 60 tablet 2  . aspirin  81 MG tablet Take 81 mg by mouth daily.    . cetirizine (ZYRTEC) 10 MG tablet Take 1 tablet (10 mg total) by mouth daily. 90 tablet 3  . FLUoxetine (PROZAC) 20 MG capsule Take 1 capsule (20 mg total) by mouth daily. 90 capsule 3  . lisinopril (ZESTRIL) 10 MG tablet Take 1 tablet (10 mg total) by mouth daily. NEED OV. 90 tablet 3  . Multiple Vitamin (MULTIVITAMIN) capsule Take 1 capsule by mouth daily.    . nitroGLYCERIN (NITROSTAT) 0.4 MG SL tablet Place 1 tablet (0.4 mg total) under the tongue every 5 (five) minutes as needed for chest pain (up to 3 doses). 25 tablet 1  . omeprazole (PRILOSEC) 20 MG capsule Take 1 capsule (20 mg total) by mouth daily. 90 capsule 3  .  RA KRILL OIL 500 MG CAPS Take by mouth.    . rosuvastatin (CRESTOR) 20 MG tablet Take 1 tablet (20 mg total) by mouth daily. 90 tablet 3  . buPROPion (WELLBUTRIN XL) 150 MG 24 hr tablet Take 1 tablet (150 mg total) by mouth daily. (Patient not taking: Reported on 04/21/2019) 30 tablet 1   No current facility-administered medications for this visit.    Family History  Problem Relation Age of Onset  . Coronary artery disease Mother 30        stent  . Heart attack Mother   . Heart disease Mother   . Hyperlipidemia Mother   . Obesity Mother   . Colon cancer Maternal Uncle   . Arrhythmia Father   . Depression Father   . Coronary artery disease Maternal Aunt 61       (Mother's twin sister)  . Lung cancer Maternal Aunt   . Coronary artery disease Maternal Uncle 46  . Heart attack Maternal Aunt        mother's twin  . Heart attack Maternal Uncle   . Breast cancer Maternal Grandmother   . Cancer Neg Hx   . Early death Neg Hx   . Hypertension Neg Hx   . Kidney disease Neg Hx   . Stroke Neg Hx   . Alcohol abuse Neg Hx   . Diabetes Neg Hx   . Drug abuse Neg Hx     Review of Systems  Genitourinary: Positive for menstrual problem.  All other systems reviewed and are negative.   Exam:   BP 130/80   Pulse (!) 132   Temp 97.7 F (36.5 C)   Ht 5\' 4"  (1.626 m)   Wt (!) 311 lb (141.1 kg)   LMP 04/03/2019 (Exact Date) Comment: Patient is currently menstruating  SpO2 96%   BMI 53.38 kg/m   Weight change: @WEIGHTCHANGE @ Height:   Height: 5\' 4"  (162.6 cm)  Ht Readings from Last 3 Encounters:  04/21/19 5\' 4"  (1.626 m)  04/07/19 5\' 8"  (1.727 m)  04/03/19 5\' 8"  (1.727 m)    General appearance: alert, cooperative and appears stated age Head: Normocephalic, without obvious abnormality, atraumatic Neck: no adenopathy, supple, symmetrical, trachea midline and thyroid normal to inspection and palpation Lungs: clear to auscultation bilaterally Cardiovascular: regular rate and  rhythm Breasts: normal appearance, no masses or tenderness Abdomen: soft, non-tender; non distended,  no masses,  no organomegaly Extremities: extremities normal, atraumatic, no cyanosis or edema Skin: Skin color, texture, turgor normal. Erythematous patchy rash under her panus and in her groin.  Lymph nodes: Cervical, supraclavicular, and axillary nodes normal. No abnormal inguinal nodes palpated Neurologic: Grossly normal   Pelvic: External  genitalia:  no lesions              Urethra:  normal appearing urethra with no masses, tenderness or lesions              Bartholins and Skenes: normal                 Vagina: normal appearing vagina with normal color and discharge, no lesions              Cervix: no lesions               Bimanual Exam:  Uterus:  anteverted, mobile, not tender, not appreciably enlarged. Limited by BMI.               Adnexa: no mass, fullness, tenderness               Rectovaginal: Confirms               Anus:  normal sphincter tone, no lesions  Carolynn Serve chaperoned for the exam.  A:  Perimenopause, night sweats, hot flashes, sleep disturbance, cycle changes. FSH in premenopausal range.  Mood changes, h/o depression  Contraception, only using W/D, recommend she be on more reliable contraception with her h/o CAD  Candida intertrigo   Hirsutism, new in the last 1.5 years  Low libido, likely multifactorial. Discussed  H/O CAD    P:   She is not a candidate for OCP's with her h/o heart disease  Discussed the option of progesterone only pills or the mirena IUD for contraception and cycle control. She is interested in the IUD, will schedule during her cycle  Will start Gabapentin at hs to help with sleep and night sweats  Nystatin for candida intertrigo  Hirsutism labs sent   CC: Dr Lenord Fellers, Dr Antoine Poche Note sent

## 2019-04-21 NOTE — Patient Instructions (Signed)

## 2019-04-24 ENCOUNTER — Telehealth: Payer: Self-pay | Admitting: Internal Medicine

## 2019-04-24 NOTE — Telephone Encounter (Signed)
OK to cancel Feb appt and see her sooner

## 2019-04-24 NOTE — Telephone Encounter (Signed)
Rhonda Colon [phone]  Rhonda Colon called to say she would like to get prescription for Vit D, since it was low at her last labs. She also would like to talk with you about going off of Prozac before starting Welbutrin. I let her know this may require office visit to discuss. She said she had an appointment coming up (05/16/2019) to see how she is doing on Welbutrin, but she has not started yet. Should we cancel that one and maybe make something sooner to discuss.

## 2019-04-25 ENCOUNTER — Telehealth: Payer: Self-pay | Admitting: Obstetrics and Gynecology

## 2019-04-25 NOTE — Telephone Encounter (Signed)
Call placed to convey benefits for Mirena insertion. Spoke with the patient and conveyed the benefits. Patient understands/agreeable with the benefits. Patient is aware of the cancellation policy. Patient to call back with cycle for scheduling.

## 2019-04-25 NOTE — Telephone Encounter (Signed)
Schedule office visit

## 2019-04-26 ENCOUNTER — Other Ambulatory Visit: Payer: Self-pay

## 2019-04-26 DIAGNOSIS — E785 Hyperlipidemia, unspecified: Secondary | ICD-10-CM

## 2019-04-26 MED ORDER — ATORVASTATIN CALCIUM 40 MG PO TABS: 40.0000 mg | ORAL_TABLET | Freq: Every day | ORAL | 3 refills | Status: DC

## 2019-04-28 LAB — 17-HYDROXYPROGESTERONE: 17-Hydroxyprogesterone: 126 ng/dL

## 2019-04-28 LAB — DHEA-SULFATE: DHEA-SO4: 154 ug/dL (ref 41.2–243.7)

## 2019-04-28 LAB — TESTOSTERONE: Testosterone: 44 ng/dL (ref 8–48)

## 2019-04-29 NOTE — Patient Instructions (Signed)
Take Prozac 20 mg daily.  Wellbutrin XL 150 mg daily.  Follow-up in a few weeks.  Would benefit from counseling.  Continue PPI, Zestril and lipid-lowering medication

## 2019-05-02 ENCOUNTER — Encounter: Payer: Self-pay | Admitting: Internal Medicine

## 2019-05-02 ENCOUNTER — Other Ambulatory Visit: Payer: Self-pay

## 2019-05-02 ENCOUNTER — Ambulatory Visit: Payer: 59 | Admitting: Internal Medicine

## 2019-05-02 VITALS — BP 110/70 | HR 87 | Ht 64.0 in | Wt 296.0 lb

## 2019-05-02 DIAGNOSIS — E559 Vitamin D deficiency, unspecified: Secondary | ICD-10-CM | POA: Diagnosis not present

## 2019-05-02 DIAGNOSIS — I10 Essential (primary) hypertension: Secondary | ICD-10-CM

## 2019-05-02 DIAGNOSIS — Z6841 Body Mass Index (BMI) 40.0 and over, adult: Secondary | ICD-10-CM

## 2019-05-02 DIAGNOSIS — F3289 Other specified depressive episodes: Secondary | ICD-10-CM | POA: Diagnosis not present

## 2019-05-02 MED ORDER — VITAMIN D (ERGOCALCIFEROL) 1.25 MG (50000 UNIT) PO CAPS: 50000.0000 [IU] | ORAL_CAPSULE | ORAL | 0 refills | Status: DC

## 2019-05-02 NOTE — Progress Notes (Signed)
   Subjective:    Patient ID: Rhonda Colon, female    DOB: April 04, 1975, 44 y.o.   MRN: [id number]  HPI  44 year old Female in for discussion regarding medication management. Has seen Dr. Oscar La, GYN for perimenopause. Seen in early January. At that time was on Prozac 20 mg daily and Wellbutrin was added 150 mg XL daily. We discussed her situation. 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 She would like to discontinue Wellbutrin. She would like to continue Prozac ay current dose. Thinks she would like to resume high dose Vitamin d weekly that had been prescribed by [facility].    Review of Systems has seen Dr. Oscar La recently re: hot flashes. Saw Dr. Antoine Poche recently for follow up on CAD.Weight in January was 314 and is now 296. Was changed to Crestor by Cardiologist. Had CPE here in January.     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 Have filled Drisdol 50,000 units weekly for 12 weeks.   Depression. D/c Wellbutrin. Continue Prozac. Can explore Effexor instead of Prozac for hot flashes with GYN.   BMI 50.81- does not want to return to [facility]. Hopes gym will open soon. Lost weight before with aggressive diet and exercise regimen.  Does not want to go to counseling. Busy with new business venture. She will let me know if she needs anything in the next few months.

## 2019-05-03 ENCOUNTER — Telehealth: Payer: Self-pay | Admitting: Obstetrics and Gynecology

## 2019-05-03 ENCOUNTER — Encounter: Payer: Self-pay | Admitting: Obstetrics and Gynecology

## 2019-05-03 NOTE — Telephone Encounter (Signed)
Patient sent the following message through MyChart. Routing to triage to assist patient with request.  Rhonda Colon, [facility]  Phone Number: [phone]  Please call me to schedule my iud. I started on February 2.

## 2019-05-03 NOTE — Telephone Encounter (Signed)
Spoke with pt. Pt states starting cycle on 05/01/2019. Pt calling to schedule Mirena IUD insertion. Pt scheduled with Dr Oscar La on 05/08/2019 at 4pm. Pt aware and agreeable. Instructed to take Motrin 800 mg with food and water one hour before procedure. Pt verbalized understanding. Pt already spoke to [place] for benefits. Orders placed.   Routing to Dr Oscar La for review and will close encounter.

## 2019-05-08 ENCOUNTER — Encounter: Payer: Self-pay | Admitting: Obstetrics and Gynecology

## 2019-05-08 ENCOUNTER — Ambulatory Visit (INDEPENDENT_AMBULATORY_CARE_PROVIDER_SITE_OTHER): Payer: 59 | Admitting: Obstetrics and Gynecology

## 2019-05-08 ENCOUNTER — Other Ambulatory Visit: Payer: Self-pay

## 2019-05-08 VITALS — BP 128/74 | HR 100 | Temp 97.7°F | Ht 65.0 in | Wt 316.0 lb

## 2019-05-08 DIAGNOSIS — N882 Stricture and stenosis of cervix uteri: Secondary | ICD-10-CM

## 2019-05-08 DIAGNOSIS — Z538 Procedure and treatment not carried out for other reasons: Secondary | ICD-10-CM

## 2019-05-08 DIAGNOSIS — Z3009 Encounter for other general counseling and advice on contraception: Secondary | ICD-10-CM

## 2019-05-08 MED ORDER — MISOPROSTOL 200 MCG PO TABS: ORAL_TABLET | ORAL | 0 refills | Status: DC

## 2019-05-08 NOTE — Progress Notes (Signed)
GYNECOLOGY  VISIT   HPI: 44 y.o.   Significant Other White or Caucasian Not Hispanic or Latino  female   [id number] with Patient's last menstrual period was 05/02/2019.   here for   IUD insert  GYNECOLOGIC HISTORY: Patient's last menstrual period was 05/02/2019. Contraception:none  Menopausal hormone therapy: none         OB History    Gravida  0   Para  0   Term  0   Preterm  0   AB  0   Living  0     SAB  0   TAB  0   Ectopic  0   Multiple  0   Live Births  0              Patient Active Problem List   Diagnosis Date Noted  . Other fatigue 08/31/2017  . Shortness of breath on exertion 08/31/2017  . Essential hypertension 08/31/2017  . Depression 12/10/2015  . Urticaria, solar 08/08/2013  . CAD (coronary artery disease) 08/28/2011    Past Medical History:  Diagnosis Date  . CAD (coronary artery disease)    a. 07/2011 NSTEMI/Cath: RCA 99%m, otw nl cors, EF 50%, inf hk -> RCA stented with 3.0x30mm Promus DES  . Depression   . Hyperlipidemia   . Morgagni hernia    a. Discovered incidentally on CT 07/2011 ->f/u with Dr. Lindie Spruce  . Obesity   . Sinus bradycardia    a. Nocturnal, asymptomatic  . Sun allergy   . Tobacco abuse    a. quit 07/2011    Past Surgical History:  Procedure Laterality Date  . CARDIAC CATHETERIZATION     left heart, with angiogram  . heart attack  2013  . LEFT HEART CATHETERIZATION WITH CORONARY ANGIOGRAM N/A 08/10/2011   Procedure: LEFT HEART CATHETERIZATION WITH CORONARY ANGIOGRAM;  Surgeon: Kathleene Hazel, MD;  Location: [facility] CATH LAB;  Service: Cardiovascular;  Laterality: N/A;  . TONSILLECTOMY      Current Outpatient Medications  Medication Sig Dispense Refill  . ALPRAZolam (XANAX) 1 MG tablet Take 1 tablet (1 mg total) by mouth 2 (two) times daily as needed for anxiety. 60 tablet 2  . aspirin 81 MG tablet Take 81 mg by mouth daily.    [name] atorvastatin (LIPITOR) 40 MG tablet Take 1 tablet (40 mg total) by mouth daily. 90  tablet 3  . cetirizine (ZYRTEC) 10 MG tablet Take 1 tablet (10 mg total) by mouth daily. 90 tablet 3  . FLUoxetine (PROZAC) 20 MG capsule Take 1 capsule (20 mg total) by mouth daily. 90 capsule 3  . lisinopril (ZESTRIL) 10 MG tablet Take 1 tablet (10 mg total) by mouth daily. NEED OV. 90 tablet 3  . Multiple Vitamin (MULTIVITAMIN) capsule Take 1 capsule by mouth daily.    . nitroGLYCERIN (NITROSTAT) 0.4 MG SL tablet Place 1 tablet (0.4 mg total) under the tongue every 5 (five) minutes as needed for chest pain (up to 3 doses). 25 tablet 1  . nystatin cream (MYCOSTATIN) Apply 1 application topically 2 (two) times daily. Apply to affected area BID for up to 7 days. 30 g 0  . omeprazole (PRILOSEC) 20 MG capsule Take 1 capsule (20 mg total) by mouth daily. 90 capsule 3  . RA KRILL OIL 500 MG CAPS Take by mouth.    . Vitamin D, Ergocalciferol, (DRISDOL) 1.25 MG (50000 UNIT) CAPS capsule Take 1 capsule (50,000 Units total) by mouth every 7 (seven) days. 12 capsule  0   No current facility-administered medications for this visit.     ALLERGIES: Patient has no known allergies.  Family History  Problem Relation Age of Onset  . Coronary artery disease Mother 57        stent  . Heart attack Mother   . Heart disease Mother   . Hyperlipidemia Mother   . Obesity Mother   . Colon cancer Maternal Uncle   . Arrhythmia Father   . Depression Father   . Coronary artery disease Maternal Aunt 61       (Mother's twin sister)  . Lung cancer Maternal Aunt   . Coronary artery disease Maternal Uncle 46  . Heart attack Maternal Aunt        mother's twin  . Heart attack Maternal Uncle   . Breast cancer Maternal Grandmother   . Cancer Neg Hx   . Early death Neg Hx   . Hypertension Neg Hx   . Kidney disease Neg Hx   . Stroke Neg Hx   . Alcohol abuse Neg Hx   . Diabetes Neg Hx   . Drug abuse Neg Hx     Social History   Socioeconomic History  . Marital status: Significant Other    Spouse name: Not on  file  . Number of children: 0  . Years of education: Not on file  . Highest education level: Not on file  Occupational History  . Occupation: Realtor  Tobacco Use  . Smoking status: Former Smoker    Packs/day: 1.00    Years: 18.00    Pack years: 18.00    Types: Cigarettes    Quit date: 08/09/2011    Years since quitting: 7.7  . Smokeless tobacco: Never Used  . Tobacco comment: 2013  Substance and Sexual Activity  . Alcohol use: Yes    Alcohol/week: 4.0 standard drinks    Types: 4 Glasses of wine per week    Comment: occassional 4 times aweek   . Drug use: No  . Sexual activity: Yes    Birth control/protection: None, Pill  Other Topics Concern  . Not on file  Social History Narrative   Lives alone.  Occasional EtOH.  Works as a Cabin crew.   Social Determinants of Health   Financial Resource Strain:   . Difficulty of Paying Living Expenses: Not on file  Food Insecurity:   . Worried About Charity fundraiser in the Last Year: Not on file  . Ran Out of Food in the Last Year: Not on file  Transportation Needs:   . Lack of Transportation (Medical): Not on file  . Lack of Transportation (Non-Medical): Not on file  Physical Activity:   . Days of Exercise per Week: Not on file  . Minutes of Exercise per Session: Not on file  Stress:   . Feeling of Stress : Not on file  Social Connections:   . Frequency of Communication with Friends and Family: Not on file  . Frequency of Social Gatherings with Friends and Family: Not on file  . Attends Religious Services: Not on file  . Active Member of Clubs or Organizations: Not on file  . Attends Archivist Meetings: Not on file  . Marital Status: Not on file  Intimate Partner Violence:   . Fear of Current or Ex-Partner: Not on file  . Emotionally Abused: Not on file  . Physically Abused: Not on file  . Sexually Abused: Not on file    Review of Systems  All other systems reviewed and are negative.   PHYSICAL EXAMINATION:     BP 128/74   Pulse 100   Temp 97.7 F (36.5 C)   Ht 5\' 5"  (1.651 m)   Wt (!) 316 lb (143.3 kg)   LMP 05/02/2019 Comment: Patient is currently menstruating  SpO2 97%   BMI 52.59 kg/m     General appearance: alert, cooperative and appears stated age  Pelvic: External genitalia:  no lesions              Urethra:  normal appearing urethra with no masses, tenderness or lesions              Bartholins and Skenes: normal                 Vagina: normal appearing vagina with normal color and discharge, no lesions              Cervix: no lesions                The risks of the mirena IUD were reviewed with the patient, including infection, abnormal bleeding and uterine perfortion. Consent was signed.  A speculum was placed in the vagina, the cervix was cleansed with betadine. A tenaculum was placed on the cervix, I was unable to pass the internal cervical os with even a mini-dilator. After multiple attempts without success the procedure was stopped.   Chaperone was present for exam.  ASSESSMENT Unsuccessful attempt at IUD insertion Cervical stenosis    PLAN Will have her return for IUD insertion under ultrasound guidance Will pre-treat with cytotec Consider paracervical block.    An After Visit Summary was printed and given to the patient.

## 2019-05-10 ENCOUNTER — Telehealth: Payer: Self-pay | Admitting: *Deleted

## 2019-05-10 NOTE — Telephone Encounter (Signed)
Call to patient. Per DPR, can leave message on voice mail. Left message calling to schedule appointment. Call back to [place] or [place].

## 2019-05-10 NOTE — Patient Instructions (Signed)
Continue Prozac. Stop Wellbutrin. Take Vitamin D weekly. RTC January 2022 or sooner if necessary.

## 2019-05-12 NOTE — Telephone Encounter (Signed)
Call to patient. Can leave message on voicemail per DPR.  Left message for patient to call back to received benefits and to schedule recommended ultrasound with Dr. Oscar La. Requested call back to [facility].

## 2019-05-16 ENCOUNTER — Ambulatory Visit: Payer: 59 | Admitting: Internal Medicine

## 2019-05-22 NOTE — Telephone Encounter (Signed)
The ultrasound is for IUD insertion. She doesn't have to do this if she has changed her mind.

## 2019-05-22 NOTE — Telephone Encounter (Signed)
Third call to patient. Per DPR, can leave message on voicemail.  Left message for patient to call back to review benefits and schedule recommended ultrasound with Dr. Oscar La.  Routing to Dr. Oscar La to advise.

## 2019-05-23 NOTE — Telephone Encounter (Signed)
Thank you for clarification. Encounter closed.

## 2019-06-12 ENCOUNTER — Encounter: Payer: Self-pay | Admitting: *Deleted

## 2019-07-30 ENCOUNTER — Other Ambulatory Visit: Payer: Self-pay | Admitting: Internal Medicine

## 2019-09-11 ENCOUNTER — Telehealth: Payer: Self-pay | Admitting: Internal Medicine

## 2019-09-11 ENCOUNTER — Ambulatory Visit (INDEPENDENT_AMBULATORY_CARE_PROVIDER_SITE_OTHER): Payer: 59 | Admitting: Internal Medicine

## 2019-09-11 ENCOUNTER — Encounter: Payer: Self-pay | Admitting: Internal Medicine

## 2019-09-11 ENCOUNTER — Other Ambulatory Visit: Payer: Self-pay

## 2019-09-11 VITALS — BP 120/80 | HR 103 | Temp 99.2°F | Ht 65.0 in | Wt 300.0 lb

## 2019-09-11 DIAGNOSIS — H00014 Hordeolum externum left upper eyelid: Secondary | ICD-10-CM

## 2019-09-11 DIAGNOSIS — H10022 Other mucopurulent conjunctivitis, left eye: Secondary | ICD-10-CM | POA: Diagnosis not present

## 2019-09-11 MED ORDER — OFLOXACIN 0.3 % OP SOLN: 2.0000 [drp] | Freq: Four times a day (QID) | OPHTHALMIC | 1 refills | Status: DC

## 2019-09-11 NOTE — Telephone Encounter (Signed)
Needs OV.  

## 2019-09-11 NOTE — Telephone Encounter (Signed)
Appointment scheduled.

## 2019-09-11 NOTE — Patient Instructions (Signed)
Apply warm hot compresses to the left upper eyelid 20 minutes at least twice daily.  Apply Ocuflox ophthalmic drops in left eye 2 drops 4 times a day for 5 to 7 days.

## 2019-09-11 NOTE — Telephone Encounter (Signed)
Rhonda Colon [phone]  Marguetta called to say she has had a swollen left eye lid since Thursday it is in the center of her eye lid and covers most of it. She has cleaned it with acholol and warm compress.

## 2019-09-11 NOTE — Progress Notes (Signed)
   Subjective:    Patient ID: Rhonda Colon, female    DOB: [birth year], 44 y.o.   MRN: [id number]  HPI 44 year old Female seen today for redness and swelling right upper eyelid onset about 2 days ago. Has had crusting in corner of left eye but no frank drainage.  Did use a new cosmetic product a few days ago.  No fever.  No sore throat.  No URI symptoms.    Review of Systems see above-denies headache or significant visual disturbances     Objective:   Physical Exam Blood pressure 120/80 pulse 103, temperature 99.2 pulse oximetry 96% weight 300 pounds  Left upper eyelid is erythematous with small nodule noted.  Conjunctiva left eye inflamed.  Extraocular movements are full.       Assessment & Plan:  Hordeolum left eye  Conjunctivitis left eye  Plan: Warm hot compresses 20 minutes at least twice daily to the left upper eyelid.  Ocuflox ophthalmic drops 2 drops in left eye 4 times a day for 5 to 7 days.

## 2019-11-13 ENCOUNTER — Telehealth: Payer: Self-pay

## 2019-11-13 NOTE — Telephone Encounter (Signed)
She needs appt

## 2019-11-13 NOTE — Telephone Encounter (Signed)
Patient called sates she was here about a month ago for "pink eye" and her eye is still draining and on the mornings she has discharge. She does not have an eye doctor. She said that you told her to call back if she wasn't better. Please advise.   Call back number [phone]

## 2019-11-14 ENCOUNTER — Encounter: Payer: Self-pay | Admitting: Internal Medicine

## 2019-11-14 ENCOUNTER — Other Ambulatory Visit: Payer: Self-pay

## 2019-11-14 ENCOUNTER — Ambulatory Visit: Payer: 59 | Admitting: Internal Medicine

## 2019-11-14 VITALS — BP 120/80 | HR 82 | Temp 98.2°F | Ht 65.0 in | Wt 290.0 lb

## 2019-11-14 DIAGNOSIS — H1033 Unspecified acute conjunctivitis, bilateral: Secondary | ICD-10-CM | POA: Diagnosis not present

## 2019-11-14 MED ORDER — TOBRAMYCIN-DEXAMETHASONE 0.3-0.1 % OP SUSP: OPHTHALMIC | 0 refills | Status: DC

## 2019-11-14 NOTE — Progress Notes (Signed)
   Subjective:    Patient ID: Rhonda Colon, female    DOB: 1976/01/05, 44 y.o.   MRN: [id number]  HPI 44 year old Female just recently had respiratory infection she caught from female friend who works at [employer]. Has had 2 Covid-19 immunizations in March and April. Was not tested for Covid with this illness.  She declines testing today.  Feels that symptoms are better.  She called about persistent eye drainage despite being treated with Ocuflox ophthalmic drops June 14.  Says irritation never completely went away although at that time she was diagnosed with a hordeolum of upper eyelid.  She was seen June 14 for redness and swelling  of upper eyelid.  Patient says eye irritation never completely got better although the hordeolum has resolved.    Review of Systems see above-denies visual changes, blurry vision, fever, chills, dysgeusia     Objective:   Physical Exam Blood pressure 120/80 pulse 82 temperature 98.2 degrees pulse oximetry 98% weight 290 pounds  Extraocular movements are full.  PERRLA.  No hordeolum is present.  She has injection of her conjunctivae bilaterally.  No drainage is present in the corners of her eyes.       Assessment & Plan:  Conjunctivitis-bilateral  Recent respiratory infection-has not been tested for COVID-19.  Has had two COVID-19 immunizations in March and April of this year.  Plan: I am beginning to wonder if this is allergic conjunctivitis.  She has no hordeolum today.  No drainage in the corners of her eyes suggesting bacterial conjunctivitis.  Possible allergic conjunctivitis?  She will use TobraDex ophthalmic drops 2 drops in each eye four times a day for 5 days and if not improving advised to  not wait several weeks to contact me.  Let me know sooner if not getting better so we can refer her to Ophthalmologist.  Time spent with patient and reviewing past medical history and past office visits is 20 minutes

## 2019-11-14 NOTE — Patient Instructions (Signed)
TobraDex ophthalmic drops 2 drops in each eye four times a day for 5 days and if not improving please call back so we can refer you to ophthalmologist.

## 2019-11-29 LAB — HM MAMMOGRAPHY

## 2019-11-30 ENCOUNTER — Encounter: Payer: Self-pay | Admitting: Internal Medicine

## 2019-12-10 ENCOUNTER — Other Ambulatory Visit: Payer: Self-pay | Admitting: Internal Medicine

## 2019-12-10 DIAGNOSIS — I10 Essential (primary) hypertension: Secondary | ICD-10-CM

## 2020-01-30 ENCOUNTER — Telehealth: Payer: Self-pay | Admitting: Internal Medicine

## 2020-01-30 NOTE — Telephone Encounter (Signed)
Scheduled on 11/04 at 12:00pm.

## 2020-01-30 NOTE — Telephone Encounter (Signed)
Theodosia Bahena [phone]  Harlowe called to say she needed below medication refilled. She has not had it filled since 05/2018, so I let her know she may need office visit.  ALPRAZolam Prudy Feeler) 1 MG tablet  [facility] - [address] [place], [place] - [address] Phone:  [phone]  Fax:  [phone]

## 2020-01-30 NOTE — Telephone Encounter (Signed)
OV Thursday or Friday

## 2020-01-30 NOTE — Telephone Encounter (Signed)
LVM to CB and schedule OV 

## 2020-02-01 ENCOUNTER — Encounter: Payer: Self-pay | Admitting: Internal Medicine

## 2020-02-01 ENCOUNTER — Ambulatory Visit: Payer: 59 | Admitting: Internal Medicine

## 2020-02-01 VITALS — BP 120/80 | HR 92 | Ht 65.0 in | Wt 308.0 lb

## 2020-02-01 DIAGNOSIS — F3289 Other specified depressive episodes: Secondary | ICD-10-CM | POA: Diagnosis not present

## 2020-02-01 DIAGNOSIS — I10 Essential (primary) hypertension: Secondary | ICD-10-CM

## 2020-02-01 DIAGNOSIS — F439 Reaction to severe stress, unspecified: Secondary | ICD-10-CM | POA: Diagnosis not present

## 2020-02-01 DIAGNOSIS — Z23 Encounter for immunization: Secondary | ICD-10-CM

## 2020-02-01 DIAGNOSIS — I252 Old myocardial infarction: Secondary | ICD-10-CM

## 2020-02-01 DIAGNOSIS — Z8639 Personal history of other endocrine, nutritional and metabolic disease: Secondary | ICD-10-CM

## 2020-02-01 MED ORDER — NITROGLYCERIN 0.4 MG SL SUBL: 0.4000 mg | SUBLINGUAL_TABLET | SUBLINGUAL | 3 refills | Status: DC | PRN

## 2020-02-01 MED ORDER — ATORVASTATIN CALCIUM 40 MG PO TABS: 40.0000 mg | ORAL_TABLET | Freq: Every day | ORAL | 0 refills | Status: DC

## 2020-02-01 MED ORDER — NYSTATIN 100000 UNIT/GM EX CREA: 1.0000 "application " | TOPICAL_CREAM | Freq: Two times a day (BID) | CUTANEOUS | 0 refills | Status: DC

## 2020-02-01 MED ORDER — ALPRAZOLAM 1 MG PO TABS: 1.0000 mg | ORAL_TABLET | Freq: Two times a day (BID) | ORAL | 2 refills | Status: DC | PRN

## 2020-02-01 MED ORDER — OMEPRAZOLE 20 MG PO CPDR: 20.0000 mg | DELAYED_RELEASE_CAPSULE | Freq: Every day | ORAL | 3 refills | Status: DC

## 2020-02-01 MED ORDER — CETIRIZINE HCL 10 MG PO TABS: 10.0000 mg | ORAL_TABLET | Freq: Every day | ORAL | 11 refills | Status: DC

## 2020-02-01 MED ORDER — FLUOXETINE HCL 40 MG PO CAPS: 40.0000 mg | ORAL_CAPSULE | Freq: Every day | ORAL | 3 refills | Status: DC

## 2020-02-01 MED ORDER — LISINOPRIL 10 MG PO TABS: 10.0000 mg | ORAL_TABLET | Freq: Every day | ORAL | 0 refills | Status: DC

## 2020-02-01 NOTE — Progress Notes (Signed)
   Subjective:    Patient ID: Rhonda Colon, female    DOB: January 12, 1976, 44 y.o.   MRN: [id number]  HPI 44 year old Female for follow-up on anxiety and depression.  Has had unpleasant situation with long-term Female partner just a few days ago.  He moved out from her home and had police watch while he gathered his belongings.  This was upsetting to patient.  Discussed at length.  Xanax and Prozac have been refilled today.  Still having some issues from time to time with what I thought was hordeolum of right upper eyelid.  She tells me today that she saw an eye physician who did not feel that she had a hordeolum.  I prescribed Ocuflox ophthalmic drops for her in June and subsequently TobraDex in August.  It seemed that she had conjunctivitis in August.  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  Has been having perimenopausal symptoms and is seeing Dr. Oscar La.  History of vitamin D deficiency treated with high-dose vitamin D at [facility] weight clinic.  However she told us in February of this year she did not want to return there.  Thinks that she will if gym would open up so she can go and exercise and take care of weight issue herself.  Issues with GE reflux.  Prilosec 20 mg daily has been prescribed.  History of hypertension treated with Zestril 10 mg daily and this was refilled.  Zyrtec was refilled for allergic rhinitis.  Lipitor refilled 40 mg daily for history of hyperlipidemia.  History of coronary artery disease status post MI.  This occurred in 2013.  Had 99% right coronary artery occlusion.  Stent was placed in the right coronary artery.  History of Morgagni hernia discovered incidentally on CT in 2013  Quit smoking in 2013.    Review of Systems see above     Objective:   Physical Exam  Spent 30 minutes speaking with patient about issues.  Blood pressure stable at 120/80 pulse 92 pulse oximetry 96% weight 308 pounds BMI 51.25    Assessment & Plan:    Situational stress  Anxiety and depression  Morbid obesity  History of MI  Hyperlipidemia  Hypertension  Plan: Patient should consider going to counseling for situational stress, anxiety and depression.  Needs to follow-up here in 3 months or sooner if necessary.

## 2020-02-24 NOTE — Patient Instructions (Addendum)
Recommend counseling for situational stress.  Have refilled Xanax and Prozac.  Continue Zestril for hypertension.  Have refilled Zyrtec for allergic rhinitis and Lipitor for hyperlipidemia.

## 2020-04-18 ENCOUNTER — Telehealth: Payer: Self-pay | Admitting: Internal Medicine

## 2020-04-18 ENCOUNTER — Other Ambulatory Visit: Payer: Self-pay

## 2020-04-18 ENCOUNTER — Telehealth (INDEPENDENT_AMBULATORY_CARE_PROVIDER_SITE_OTHER): Payer: 59 | Admitting: Internal Medicine

## 2020-04-18 ENCOUNTER — Ambulatory Visit (HOSPITAL_COMMUNITY)
Admission: RE | Admit: 2020-04-18 | Discharge: 2020-04-18 | Disposition: A | Discharge status: Home / Self Care | Payer: 59 | Source: Ambulatory Visit | Attending: Internal Medicine | Admitting: Internal Medicine

## 2020-04-18 VITALS — BP 101/78 | HR 110 | Temp 98.2°F | Ht 65.0 in | Wt 300.0 lb

## 2020-04-18 DIAGNOSIS — I252 Old myocardial infarction: Secondary | ICD-10-CM

## 2020-04-18 DIAGNOSIS — J22 Unspecified acute lower respiratory infection: Secondary | ICD-10-CM

## 2020-04-18 DIAGNOSIS — U071 COVID-19: Secondary | ICD-10-CM

## 2020-04-18 DIAGNOSIS — R059 Cough, unspecified: Secondary | ICD-10-CM | POA: Insufficient documentation

## 2020-04-18 DIAGNOSIS — Q790 Congenital diaphragmatic hernia: Secondary | ICD-10-CM

## 2020-04-18 DIAGNOSIS — I2510 Atherosclerotic heart disease of native coronary artery without angina pectoris: Secondary | ICD-10-CM

## 2020-04-18 MED ORDER — BENZONATATE 100 MG PO CAPS: 100.0000 mg | ORAL_CAPSULE | Freq: Three times a day (TID) | ORAL | 0 refills | Status: DC | PRN

## 2020-04-18 MED ORDER — DOXYCYCLINE HYCLATE 100 MG PO TABS: 100.0000 mg | ORAL_TABLET | Freq: Two times a day (BID) | ORAL | 0 refills | Status: DC

## 2020-04-18 NOTE — Telephone Encounter (Signed)
Called Arlesia back she is going to try and find home test.

## 2020-04-18 NOTE — Telephone Encounter (Signed)
She needs a Covid test today

## 2020-04-18 NOTE — Progress Notes (Signed)
   Subjective:    Patient ID: Rhonda Colon, female    DOB: 1976/01/10, 45 y.o.   MRN: [id number]  HPI 45 year old Female seen via interactive telecommunications due to Coronavirus pandemic.  She is agreeable to visit in this format today.  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  She is in her home and I am in my office.  Has had 2 Moderna vaccines in March and April of 2021.  In November 2021 she was prescribed Xanax for anxiety. She has a history of hyperlipidemia, hypertension, GE reflux, vitamin D deficiency.  History of anxiety and depression.  She has a history of Mirena IUD placed by Dr. Oscar La.  History of coronary artery disease status post NSTEMI May 2013 with 99% right coronary artery.  Ejection fraction 50%.  Received drug-eluting stent to the right coronary artery.  Has Morgagni hernia discovered incidentally on CT 2013.  Quit smoking in 2013.  History of depression.  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  Patient called to say that for 2 days she has been running fever 102 to 103 degrees.  Has had head and chest congestion.  She has had cough and headache.  Denies any fever.  Had COVID vaccines in March and April.  Did have COVID-19 test yesterday at [place] location but results are not back yet.  No known COVID exposure that she is aware of. Review of Systems see above     Objective:   Physical Exam Reports that blood pressure is 101/78 with pulse of 110.  Temperature 98.2 degrees.  Pulse oximetry 92%.  Weight is 300 pounds and BMI is 49.92.  She is seen virtually in her home today.  Does not appear to be tachypneic and is able to give a clear concise history.  Reports significant fever of 102 to 103 degrees associated with head and chest congestion.  His only had 2 COVID-19 immunizations.       Assessment & Plan:  Suspect COVID-19 virus infection-rule out pneumonia.  Chest x-ray shows large hernia  compressing right middle lobe.  The same hernia was present in 2013.  Cardiologist had recommended that she see Dr. Lindie Spruce when she saw cardiologist last year.  Patient reports her pulse oximetry readings are within normal limits.  She should rest and drink plenty of fluids.  Will need to quarantine for minimum of 5 days.  No evidence of pneumonia on chest x-ray done at [facility] today.  She is awaiting results of COVID-19 testing but I suspect it will be positive.  Starting her on doxycycline 100 mg twice daily for 10 days.  Will take Tessalon Perles 100 mg up to 3 times daily as needed for cough.  Rest and drink plenty of fluids.  Pharmacy called back and said Tessalon Perles were not available so gave her Hycodan 1 teaspoon every 8 hours as needed for cough.  Plan: Doxycycline 100 mg twice daily for 10 days.  Tessalon Perles not available so we will take Hycodan instead 1 teaspoon every 8 hours as needed for cough.  Monitor blood pressure, heart rate, and oxygen level.  Call if oxygen level falls less than 95% on room air.  Plan to quarantine minimum of 5 days with COVID-19 test pending.

## 2020-04-18 NOTE — Telephone Encounter (Signed)
When I called her back, she has no access to a home test, but since I talk with her the first time, she has tried to eat and now she has no taste. I went ahead and scheduled her for a virtual visit.

## 2020-04-18 NOTE — Telephone Encounter (Signed)
Rhonda Colon [phone]  Lawson Fiscal called to say for the last 2 days she has been running fever 103-102,  Head and chest congestion, cough, and headache, today she does not have a fever yet. No covid exposure that she knows of, Had COVID vaccine in March and April. She did go to [facility] yesterday and have COVID test but has not got the results yet.

## 2020-04-18 NOTE — Telephone Encounter (Signed)
Does she have a home covid test she can take now?

## 2020-04-19 ENCOUNTER — Telehealth: Payer: Self-pay | Admitting: Internal Medicine

## 2020-04-19 MED ORDER — HYDROCODONE-HOMATROPINE 5-1.5 MG/5ML PO SYRP
5.0000 mL | ORAL_SOLUTION | Freq: Three times a day (TID) | ORAL | 0 refills | Status: DC | PRN
Start: 2020-04-19 — End: 2020-06-17

## 2020-04-19 MED ORDER — FLUCONAZOLE 150 MG PO TABS: 150.0000 mg | ORAL_TABLET | Freq: Once | ORAL | 0 refills | Status: DC

## 2020-04-19 NOTE — Telephone Encounter (Signed)
Called patient to see what she was needing and she stated that when she read Xray results it says that her right lung is collapsed in the middle and the hernia is worse than it was in 2013. She would like for you to explain this better to her and let her know what she needs to do.  She also did not get her cough medicine yesterday because pharmacy was out and they said they did not have any medication for a yeast infection.

## 2020-04-20 ENCOUNTER — Encounter: Payer: Self-pay | Admitting: Internal Medicine

## 2020-04-20 NOTE — Patient Instructions (Addendum)
Rest and drink plenty of fluids.  Start doxycycline 100 mg twice daily for 10 days.  Tessalon Perles not available also prescribed Hycodan a teaspoon every 8 hours as needed for cough.  Let me know when COVID-19 result comes back.  Plan on quarantining at least 5 days.  Hernia can be evaluated at a later time.  Monitor oxygen level and call if less than below 95% o.  Rest and drink fluids.  Call if symptoms worsen.  Addendum: Patient informed us yesterday that her COVID-19 test was positive.  Symptoms are stable.  Stay at home.  Rest.  Drink plenty of fluids.  She is concerned about chest x-ray results showing more gagging hernia and that this hernia was present in 2013.  We can have it evaluated at a later time she is well from COVID-19.

## 2020-04-20 NOTE — Telephone Encounter (Signed)
See phone note and My Chart notes

## 2020-05-02 ENCOUNTER — Other Ambulatory Visit: Payer: Self-pay | Admitting: Internal Medicine

## 2020-06-05 ENCOUNTER — Telehealth: Payer: Self-pay | Admitting: Cardiology

## 2020-06-05 DIAGNOSIS — I2510 Atherosclerotic heart disease of native coronary artery without angina pectoris: Secondary | ICD-10-CM

## 2020-06-05 DIAGNOSIS — E785 Hyperlipidemia, unspecified: Secondary | ICD-10-CM

## 2020-06-05 NOTE — Telephone Encounter (Signed)
Patient wanted to know if she needed to have labs done prior to her appointment 06/17/20. The orders in her chart are currently expired. Please let the patient know what Dr. Antoine Poche recommends

## 2020-06-05 NOTE — Telephone Encounter (Signed)
Left message for patient that fasting labs are needed prior to appointment. Lab orders mailed to the pt

## 2020-06-06 ENCOUNTER — Other Ambulatory Visit: Payer: Self-pay | Admitting: Internal Medicine

## 2020-06-11 LAB — COMPREHENSIVE METABOLIC PANEL
ALT: 75 IU/L — ABNORMAL HIGH (ref 0–32)
AST: 93 IU/L — ABNORMAL HIGH (ref 0–40)
Albumin/Globulin Ratio: 1.5 (ref 1.2–2.2)
Albumin: 4.3 g/dL (ref 3.8–4.8)
Alkaline Phosphatase: 113 IU/L (ref 44–121)
BUN/Creatinine Ratio: 15 (ref 9–23)
BUN: 10 mg/dL (ref 6–24)
Bilirubin Total: 0.5 mg/dL (ref 0.0–1.2)
CO2: 21 mmol/L (ref 20–29)
Calcium: 9.2 mg/dL (ref 8.7–10.2)
Chloride: 97 mmol/L (ref 96–106)
Creatinine, Ser: 0.68 mg/dL (ref 0.57–1.00)
Globulin, Total: 2.9 g/dL (ref 1.5–4.5)
Glucose: 99 mg/dL (ref 65–99)
Potassium: 4.6 mmol/L (ref 3.5–5.2)
Sodium: 138 mmol/L (ref 134–144)
Total Protein: 7.2 g/dL (ref 6.0–8.5)
eGFR: 109 mL/min/{1.73_m2} (ref >59)

## 2020-06-11 LAB — LIPID PANEL
Chol/HDL Ratio: 3.7 ratio (ref 0.0–4.4)
Cholesterol, Total: 175 mg/dL (ref 100–199)
HDL: 47 mg/dL (ref >39)
LDL Chol Calc (NIH): 111 mg/dL — ABNORMAL HIGH (ref 0–99)
Triglycerides: 92 mg/dL (ref 0–149)
VLDL Cholesterol Cal: 17 mg/dL (ref 5–40)

## 2020-06-16 DIAGNOSIS — E785 Hyperlipidemia, unspecified: Secondary | ICD-10-CM | POA: Insufficient documentation

## 2020-06-16 NOTE — Progress Notes (Signed)
Cardiology Office Note   Date:  06/17/2020   ID:  Rhonda Colon, DOB October 02, 1975, MRN [id number]  PCP:  Margaree Mackintosh, MD  Cardiologist:   Rollene Rotunda, MD    Chief Complaint  Patient presents with  . Follow-up  . Shortness of Breath     History of Present Illness: Rhonda Colon is a 45 y.o. female who presents for followup after a previous MI . Since I last saw her continued shortness of breath.  This is been a chronic problem since she gained weight back after initially losing it after her heart attack.  She is also been limited in her activities as she helps to take care of her mom and she works from home.  She is not as physically active though she started exercising with a stationary bike doing about 15 minutes a day.  She has no chest pressure, neck or arm discomfort.  She has no new PND or orthopnea.  However, she has shortness of breath and actually had a chest x-ray which confirms right middle lobe atelectasis related to what was diagnosed in 2013 as a more Morgagni hernia.     Past Medical History:  Diagnosis Date  . CAD (coronary artery disease)    a. 07/2011 NSTEMI/Cath: RCA 99%m, otw nl cors, EF 50%, inf hk -> RCA stented with 3.0x73mm Promus DES  . Depression   . Hyperlipidemia   . Morgagni hernia    a. Discovered incidentally on CT 07/2011 ->f/u with Dr. Lindie Spruce  . Obesity   . Sinus bradycardia    a. Nocturnal, asymptomatic  . Sun allergy   . Tobacco abuse    a. quit 07/2011    Past Surgical History:  Procedure Laterality Date  . CARDIAC CATHETERIZATION     left heart, with angiogram  . heart attack  2013  . LEFT HEART CATHETERIZATION WITH CORONARY ANGIOGRAM N/A 08/10/2011   Procedure: LEFT HEART CATHETERIZATION WITH CORONARY ANGIOGRAM;  Surgeon: Kathleene Hazel, MD;  Location: [facility] CATH LAB;  Service: Cardiovascular;  Laterality: N/A;  . TONSILLECTOMY       Current Outpatient Medications  Medication Sig Dispense Refill  . ALPRAZolam (XANAX) 1  MG tablet Take 1 tablet (1 mg total) by mouth 2 (two) times daily as needed for anxiety. 60 tablet 2  . aspirin 81 MG tablet Take 81 mg by mouth daily.    . cetirizine (ZYRTEC) 10 MG tablet Take 1 tablet (10 mg total) by mouth daily. 30 tablet 11  . fluconazole (DIFLUCAN) 150 MG tablet TAKE ONE TABLET BY MOUTH AS A ONE-TIME DOSE 1 tablet 0  . FLUoxetine (PROZAC) 40 MG capsule Take 1 capsule (40 mg total) by mouth daily. 90 capsule 3  . lisinopril (ZESTRIL) 10 MG tablet Take 1 tablet (10 mg total) by mouth daily. 90 tablet 0  . Multiple Vitamin (MULTIVITAMIN) capsule Take 1 capsule by mouth daily.    . nitroGLYCERIN (NITROSTAT) 0.4 MG SL tablet Place 1 tablet (0.4 mg total) under the tongue every 5 (five) minutes as needed for chest pain. 50 tablet 3  . nystatin cream (MYCOSTATIN) APPLY 1 APPLICATION OF CREAM TOPICALLY TO AFFECTED AREA TWICE DAILY FOR  UP  TO  7  DAYS 30 g 0  . omeprazole (PRILOSEC) 20 MG capsule Take 1 capsule (20 mg total) by mouth daily. 30 capsule 3  . rosuvastatin (CRESTOR) 20 MG tablet Take 1 tablet (20 mg total) by mouth daily. 90 tablet 3  No current facility-administered medications for this visit.    Allergies:   Patient has no known allergies.    ROS:  Please see the history of present illness.   Otherwise, review of systems are positive for none.   All other systems are reviewed and negative.    PHYSICAL EXAM: VS:  BP 130/72   Pulse 99   Ht 5\' 3"  (1.6 m)   Wt (!) 314 lb 6.4 oz (142.6 kg)   SpO2 95%   BMI 55.69 kg/m  , BMI Body mass index is 55.69 kg/m. GENERAL:  Well appearing NECK:  No jugular venous distention, waveform within normal limits, carotid upstroke brisk and symmetric, no bruits, no thyromegaly LUNGS:  Clear to auscultation bilaterally CHEST:  Unremarkable HEART:  PMI not displaced or sustained,S1 and S2 within normal limits, no S3, no S4, no clicks, no rubs, no murmurs ABD:  Flat, positive bowel sounds normal in frequency in pitch, no  bruits, no rebound, no guarding, no midline pulsatile mass, no hepatomegaly, no splenomegaly EXT:  2 plus pulses throughout, no edema, no cyanosis no clubbing   EKG:  EKG is  ordered today. The ekg ordered today demonstrates normal sinus rhythm, rate 99, axis is normal limits, intervals within normal limits, no acute ST-T wave changes.   Recent Labs: 06/11/2020: ALT 75; BUN 10; Creatinine, Ser 0.68; Potassium 4.6; Sodium 138    Lipid Panel    Component Value Date/Time   CHOL 175 06/11/2020 1113   TRIG 92 06/11/2020 1113   HDL 47 06/11/2020 1113   CHOLHDL 3.7 06/11/2020 1113   CHOLHDL 3.3 03/28/2019 0938   VLDL 9 06/14/2014 0907   LDLCALC 111 (H) 06/11/2020 1113   LDLCALC 103 (H) 03/28/2019 0938      Wt Readings from Last 3 Encounters:  06/17/20 (!) 314 lb 6.4 oz (142.6 kg)  04/18/20 300 lb (136.1 kg)  02/01/20 (!) 308 lb (139.7 kg)      Other studies Reviewed: Additional studies/ records that were reviewed today include: CXR, labs. Review of the above records demonstrates:  Please see elsewhere in the note.     ASSESSMENT AND PLAN:  CAD:    The patient has no new sypmtoms.  No further cardiovascular testing is indicated.  We will continue with aggressive risk reduction and meds as listed.  HL:     Her LDL was not at target.  LDL was 111.  I am going to stop her Lipitor and start Crestor 20 mg daily.   SOB: I think this is multifactorial with her weight but she does have a large hernia with compression and atelectasis of the lung.  I am getting consider sending her to surgery to consider perhaps repair of this as I think it is causing symptoms.  Current medicines are reviewed at length with the patient today.  The patient does not have concerns regarding medicines.  The following changes have been made:  As above  Labs/ tests ordered today include:   Orders Placed This Encounter  Procedures  . Lipid panel  . Hepatic function panel  . Ambulatory referral to  Cardiothoracic Surgery  . EKG 12-Lead     Disposition:   FU with me in one year.     Signed, 13/04/21, MD  06/17/2020 11:32 AM    [facility]

## 2020-06-17 ENCOUNTER — Other Ambulatory Visit: Payer: Self-pay

## 2020-06-17 ENCOUNTER — Telehealth: Payer: Self-pay | Admitting: *Deleted

## 2020-06-17 ENCOUNTER — Ambulatory Visit (INDEPENDENT_AMBULATORY_CARE_PROVIDER_SITE_OTHER): Payer: 59 | Admitting: Cardiology

## 2020-06-17 ENCOUNTER — Encounter: Payer: Self-pay | Admitting: Cardiology

## 2020-06-17 VITALS — BP 130/72 | HR 99 | Ht 63.0 in | Wt 314.4 lb

## 2020-06-17 DIAGNOSIS — I10 Essential (primary) hypertension: Secondary | ICD-10-CM

## 2020-06-17 DIAGNOSIS — E785 Hyperlipidemia, unspecified: Secondary | ICD-10-CM | POA: Diagnosis not present

## 2020-06-17 DIAGNOSIS — Q790 Congenital diaphragmatic hernia: Secondary | ICD-10-CM

## 2020-06-17 DIAGNOSIS — Z720 Tobacco use: Secondary | ICD-10-CM | POA: Diagnosis not present

## 2020-06-17 DIAGNOSIS — I2510 Atherosclerotic heart disease of native coronary artery without angina pectoris: Secondary | ICD-10-CM

## 2020-06-17 MED ORDER — ROSUVASTATIN CALCIUM 20 MG PO TABS: 20.0000 mg | ORAL_TABLET | Freq: Every day | ORAL | 3 refills | Status: DC

## 2020-06-17 MED ORDER — NITROGLYCERIN 0.4 MG SL SUBL: 0.4000 mg | SUBLINGUAL_TABLET | SUBLINGUAL | 3 refills | Status: DC | PRN

## 2020-06-17 MED ORDER — LISINOPRIL 10 MG PO TABS: 10.0000 mg | ORAL_TABLET | Freq: Every day | ORAL | 3 refills | Status: DC

## 2020-06-17 NOTE — Telephone Encounter (Addendum)
OV today with Dr. Gerome Apley to Dr. Cliffton Asters.  CT chest needed prior to scheduling.   Ok to order per Dr. Antoine Poche.     Discussed with patient, aware and verbalized understanding.    Order placed.

## 2020-06-17 NOTE — Patient Instructions (Addendum)
Medication Instructions:  STOP atorvastatin (Lipitor) START rosuvastatin (Crestor) 20 mg daily  *If you need a refill on your cardiac medications before your next appointment, please call your pharmacy*   Lab Work: Please return for FASTING labs in 10 weeks (Lipid, Hepatic)  Our in office lab hours are Monday-Friday 8:00-4:00, closed for lunch 12:45-1:45 pm.  No appointment needed.  Follow-Up: At [facility], you and your health needs are our priority.  As part of our continuing mission to provide you with exceptional heart care, we have created designated Provider Care Teams.  These Care Teams include your primary Cardiologist (physician) and Advanced Practice Providers (APPs -  Physician Assistants and Nurse Practitioners) who all work together to provide you with the care you need, when you need it.  We recommend signing up for the patient portal called "MyChart".  Sign up information is provided on this After Visit Summary.  MyChart is used to connect with patients for Virtual Visits (Telemedicine).  Patients are able to view lab/test results, encounter notes, upcoming appointments, etc.  Non-urgent messages can be sent to your provider as well.   To learn more about what you can do with MyChart, go to [email or web address].    Your next appointment:   12 month(s)  The format for your next appointment:   In Person  Provider:   Rollene Rotunda, MD   Other Instructions You have been referred to: Dr. Cliffton Asters --his office will call to arrange this appointment

## 2020-06-17 NOTE — Addendum Note (Signed)
Addended by: Johney Frame A on: 06/17/2020 12:01 PM   Modules accepted: Orders

## 2020-06-18 ENCOUNTER — Telehealth: Payer: Self-pay | Admitting: Cardiology

## 2020-06-18 NOTE — Telephone Encounter (Signed)
Left message for patient to call and discuss scheduling the CT chest w/o contrast ordered by Dr. Hochrein 

## 2020-06-27 NOTE — Telephone Encounter (Signed)
Left message for patient to call and discuss scheduling the CT chest ordered by Dr. Antoine Poche

## 2020-07-08 ENCOUNTER — Ambulatory Visit (INDEPENDENT_AMBULATORY_CARE_PROVIDER_SITE_OTHER): Payer: 59 | Admitting: Internal Medicine

## 2020-07-08 ENCOUNTER — Telehealth: Payer: Self-pay | Admitting: Internal Medicine

## 2020-07-08 ENCOUNTER — Encounter: Payer: Self-pay | Admitting: Internal Medicine

## 2020-07-08 ENCOUNTER — Other Ambulatory Visit: Payer: Self-pay

## 2020-07-08 VITALS — HR 101 | Temp 99.0°F

## 2020-07-08 DIAGNOSIS — J22 Unspecified acute lower respiratory infection: Secondary | ICD-10-CM | POA: Diagnosis not present

## 2020-07-08 DIAGNOSIS — Z8616 Personal history of COVID-19: Secondary | ICD-10-CM | POA: Diagnosis not present

## 2020-07-08 DIAGNOSIS — H6691 Otitis media, unspecified, right ear: Secondary | ICD-10-CM | POA: Diagnosis not present

## 2020-07-08 DIAGNOSIS — Q790 Congenital diaphragmatic hernia: Secondary | ICD-10-CM

## 2020-07-08 DIAGNOSIS — I10 Essential (primary) hypertension: Secondary | ICD-10-CM

## 2020-07-08 DIAGNOSIS — I252 Old myocardial infarction: Secondary | ICD-10-CM

## 2020-07-08 DIAGNOSIS — R059 Cough, unspecified: Secondary | ICD-10-CM

## 2020-07-08 DIAGNOSIS — I2510 Atherosclerotic heart disease of native coronary artery without angina pectoris: Secondary | ICD-10-CM

## 2020-07-08 MED ORDER — FLUCONAZOLE 150 MG PO TABS: 150.0000 mg | ORAL_TABLET | Freq: Once | ORAL | 1 refills | Status: AC

## 2020-07-08 MED ORDER — HYDROCODONE-HOMATROPINE 5-1.5 MG/5ML PO SYRP: 5.0000 mL | ORAL_SOLUTION | Freq: Three times a day (TID) | ORAL | 0 refills | Status: DC | PRN

## 2020-07-08 NOTE — Telephone Encounter (Signed)
Lobby visit 

## 2020-07-08 NOTE — Progress Notes (Signed)
   Subjective:    Patient ID: Rhonda Colon, female    DOB: [birth year], 45 y.o.   MRN: [id number]  HPI 45 year old Female with history of Covid-19 in late January of this year, history of coronary disease followed by Dr. Antoine Poche called with complaint of deep cough, runny nose and sputum that was clear but may have had some yellow tint with onset several days ago.  She did a home COVID test on Wednesday, April 6 that was negative.  She has had 2 COVID vaccines in the Spring of 2021 by our records. She was seen in person today. Is having some ear discomfort in right ear.  History ER fat-containing epigastric hernia/lipoma causing near complete atelectasis of the right middle lobe.  She has known about this for some time.  Had CT angio in 2013 showing a large Morgagni hernia with herniation of the omentum into the right inferior chest producing right middle lobe atelectasis.       Review of Systems see above     Objective:   Physical Exam Pulse is 101 regular temperature 99 degrees and pulse oximetry 97%  Right TM is slightly full and pink.  It is somewhat dull.  Left TM is clear.  Pharynx is slightly injected.  Neck is supple.  Chest clear to auscultation without rales or wheezing.  COVID-19 PCR test obtained and proved to be negative       Assessment & Plan:  Acute right otitis media  Acute lower respiratory infection-chest x-ray ordered and Morgagni hernia noted  History of coronary disease followed by Dr. Phineas Douglas hernia  History of essential hypertension  Morbid obesity  History of COVID-19-January 2022 and recovered  Plan: Doxycycline 100 mg twice daily for 10 days.  Hycodan 1 teaspoon every 8 hours as needed for cough.  Rest and drink fluids.  Call if symptoms not improving.  Diflucan 150 mg tablet to take if develops Candida vaginitis while on antibiotic therapy.

## 2020-07-08 NOTE — Telephone Encounter (Signed)
Scheduled

## 2020-07-08 NOTE — Telephone Encounter (Signed)
Rhonda Colon [phone]  Rhonda Colon called to say she has a deep cough, runny nose, with some mucus that is mainly clear with some yellowish tint since last Monday. She did home COVID last Wednesday that was negative. Has had 2 vaccines and had COVID in January.

## 2020-07-08 NOTE — Patient Instructions (Addendum)
COVID-19 PCR test obtained by nasal swab.  Rest and drink plenty of fluids.  Quarantine at home until test results are back.  Addendum: COVID-19 PCR test is negative.  She was prescribed  doxycycline 100 mg twice daily for 10 days.  Diflucan 150 mg tablet to take if develops Candida vaginitis while on antibiotic.  Hycodan 1 teaspoon every 8 hours as needed for cough.

## 2020-07-09 ENCOUNTER — Telehealth: Payer: Self-pay | Admitting: Internal Medicine

## 2020-07-09 MED ORDER — DOXYCYCLINE HYCLATE 100 MG PO TABS: 100.0000 mg | ORAL_TABLET | Freq: Two times a day (BID) | ORAL | 0 refills | Status: DC

## 2020-07-09 NOTE — Addendum Note (Signed)
Addended by: Gregery Na on: 07/09/2020 10:08 AM   Modules accepted: Orders

## 2020-07-09 NOTE — Telephone Encounter (Signed)
Yes,Rhonda Colon has been asked to call her. I got her MyChart message this morning

## 2020-07-09 NOTE — Telephone Encounter (Signed)
Pt calling and following up with appt from yesterday, she was asking if an antibiotic was going to be called in as well

## 2020-07-10 LAB — SARS-COV-2 RNA,(COVID-19) QUALITATIVE NAAT: SARS CoV2 RNA: NOT DETECTED

## 2020-07-17 ENCOUNTER — Inpatient Hospital Stay: Admission: RE | Admit: 2020-07-17 | Payer: 59 | Source: Ambulatory Visit

## 2020-07-26 ENCOUNTER — Encounter: Payer: 59 | Admitting: Thoracic Surgery (Cardiothoracic Vascular Surgery)

## 2020-07-30 ENCOUNTER — Other Ambulatory Visit: Payer: 59 | Admitting: Internal Medicine

## 2020-07-30 ENCOUNTER — Other Ambulatory Visit: Payer: Self-pay

## 2020-07-30 ENCOUNTER — Ambulatory Visit (INDEPENDENT_AMBULATORY_CARE_PROVIDER_SITE_OTHER)
Admission: RE | Admit: 2020-07-30 | Discharge: 2020-07-30 | Disposition: A | Discharge status: Home / Self Care | Payer: 59 | Source: Ambulatory Visit | Attending: Cardiology | Admitting: Cardiology

## 2020-07-30 DIAGNOSIS — Z6841 Body Mass Index (BMI) 40.0 and over, adult: Secondary | ICD-10-CM

## 2020-07-30 DIAGNOSIS — Q790 Congenital diaphragmatic hernia: Secondary | ICD-10-CM | POA: Diagnosis not present

## 2020-07-30 DIAGNOSIS — Z0000 Encounter for general adult medical examination without abnormal findings: Secondary | ICD-10-CM

## 2020-07-30 DIAGNOSIS — I10 Essential (primary) hypertension: Secondary | ICD-10-CM

## 2020-07-30 DIAGNOSIS — I2510 Atherosclerotic heart disease of native coronary artery without angina pectoris: Secondary | ICD-10-CM

## 2020-07-30 DIAGNOSIS — F439 Reaction to severe stress, unspecified: Secondary | ICD-10-CM

## 2020-07-30 DIAGNOSIS — E559 Vitamin D deficiency, unspecified: Secondary | ICD-10-CM

## 2020-07-31 LAB — CBC WITH DIFFERENTIAL/PLATELET
Absolute Monocytes: 686 cells/uL (ref 200–950)
Basophils Absolute: 62 cells/uL (ref 0–200)
Basophils Relative: 0.7 %
Eosinophils Absolute: 220 cells/uL (ref 15–500)
Eosinophils Relative: 2.5 %
HCT: 43.2 % (ref 35.0–45.0)
Hemoglobin: 14.4 g/dL (ref 11.7–15.5)
Lymphs Abs: 3326 cells/uL (ref 850–3900)
MCH: 31.1 pg (ref 27.0–33.0)
MCHC: 33.3 g/dL (ref 32.0–36.0)
MCV: 93.3 fL (ref 80.0–100.0)
MPV: 11.3 fL (ref 7.5–12.5)
Monocytes Relative: 7.8 %
Neutro Abs: 4506 cells/uL (ref 1500–7800)
Neutrophils Relative %: 51.2 %
Platelets: 272 10*3/uL (ref 140–400)
RBC: 4.63 10*6/uL (ref 3.80–5.10)
RDW: 13.1 % (ref 11.0–15.0)
Total Lymphocyte: 37.8 %
WBC: 8.8 10*3/uL (ref 3.8–10.8)

## 2020-07-31 LAB — COMPLETE METABOLIC PANEL WITH GFR
AG Ratio: 1.6 (calc) (ref 1.0–2.5)
ALT: 48 U/L — ABNORMAL HIGH (ref 6–29)
AST: 54 U/L — ABNORMAL HIGH (ref 10–35)
Albumin: 3.8 g/dL (ref 3.6–5.1)
Alkaline phosphatase (APISO): 71 U/L (ref 31–125)
BUN: 11 mg/dL (ref 7–25)
CO2: 29 mmol/L (ref 20–32)
Calcium: 9 mg/dL (ref 8.6–10.2)
Chloride: 104 mmol/L (ref 98–110)
Creat: 0.74 mg/dL (ref 0.50–1.10)
GFR, Est African American: 113 mL/min/{1.73_m2} (ref >=60)
GFR, Est Non African American: 97 mL/min/{1.73_m2} (ref >=60)
Globulin: 2.4 g/dL (calc) (ref 1.9–3.7)
Glucose, Bld: 95 mg/dL (ref 65–99)
Potassium: 4.9 mmol/L (ref 3.5–5.3)
Sodium: 140 mmol/L (ref 135–146)
Total Bilirubin: 0.3 mg/dL (ref 0.2–1.2)
Total Protein: 6.2 g/dL (ref 6.1–8.1)

## 2020-07-31 LAB — HEMOGLOBIN A1C
Hgb A1c MFr Bld: 5.6 % of total Hgb (ref <5.7)
Mean Plasma Glucose: 114 mg/dL
eAG (mmol/L): 6.3 mmol/L

## 2020-07-31 LAB — VITAMIN D 25 HYDROXY (VIT D DEFICIENCY, FRACTURES): Vit D, 25-Hydroxy: 31 ng/mL (ref 30–100)

## 2020-07-31 LAB — TSH: TSH: 1.53 mIU/L

## 2020-08-01 ENCOUNTER — Ambulatory Visit (INDEPENDENT_AMBULATORY_CARE_PROVIDER_SITE_OTHER): Payer: 59 | Admitting: Internal Medicine

## 2020-08-01 ENCOUNTER — Encounter: Payer: Self-pay | Admitting: Internal Medicine

## 2020-08-01 ENCOUNTER — Other Ambulatory Visit: Payer: Self-pay

## 2020-08-01 VITALS — BP 110/70 | HR 100 | Ht 63.0 in | Wt 310.0 lb

## 2020-08-01 DIAGNOSIS — K760 Fatty (change of) liver, not elsewhere classified: Secondary | ICD-10-CM

## 2020-08-01 DIAGNOSIS — Z0000 Encounter for general adult medical examination without abnormal findings: Secondary | ICD-10-CM | POA: Diagnosis not present

## 2020-08-01 DIAGNOSIS — I252 Old myocardial infarction: Secondary | ICD-10-CM | POA: Diagnosis not present

## 2020-08-01 DIAGNOSIS — Z8616 Personal history of COVID-19: Secondary | ICD-10-CM

## 2020-08-01 DIAGNOSIS — Z6841 Body Mass Index (BMI) 40.0 and over, adult: Secondary | ICD-10-CM

## 2020-08-01 DIAGNOSIS — Q790 Congenital diaphragmatic hernia: Secondary | ICD-10-CM

## 2020-08-01 DIAGNOSIS — I10 Essential (primary) hypertension: Secondary | ICD-10-CM | POA: Diagnosis not present

## 2020-08-01 DIAGNOSIS — F419 Anxiety disorder, unspecified: Secondary | ICD-10-CM

## 2020-08-01 DIAGNOSIS — F32A Depression, unspecified: Secondary | ICD-10-CM

## 2020-08-01 DIAGNOSIS — I2510 Atherosclerotic heart disease of native coronary artery without angina pectoris: Secondary | ICD-10-CM | POA: Diagnosis not present

## 2020-08-01 LAB — POCT URINALYSIS DIPSTICK
Appearance: NEGATIVE
Bilirubin, UA: NEGATIVE
Blood, UA: NEGATIVE
Glucose, UA: NEGATIVE
Ketones, UA: NEGATIVE
Leukocytes, UA: NEGATIVE
Nitrite, UA: NEGATIVE
Odor: NEGATIVE
Protein, UA: NEGATIVE
Spec Grav, UA: 1.015 (ref 1.010–1.025)
Urobilinogen, UA: 0.2 E.U./dL
pH, UA: 6.5 (ref 5.0–8.0)

## 2020-08-01 LAB — CBC WITH DIFFERENTIAL/PLATELET

## 2020-08-01 LAB — COMPLETE METABOLIC PANEL WITH GFR

## 2020-08-01 LAB — VITAMIN D 25 HYDROXY (VIT D DEFICIENCY, FRACTURES)

## 2020-08-01 LAB — HEMOGLOBIN A1C

## 2020-08-01 LAB — TSH
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
## 2020-08-09 ENCOUNTER — Ambulatory Visit (INDEPENDENT_AMBULATORY_CARE_PROVIDER_SITE_OTHER): Payer: 59 | Admitting: Obstetrics and Gynecology

## 2020-08-09 ENCOUNTER — Other Ambulatory Visit (HOSPITAL_COMMUNITY)
Admission: RE | Admit: 2020-08-09 | Discharge: 2020-08-09 | Disposition: A | Discharge status: Home / Self Care | Payer: 59 | Source: Ambulatory Visit | Attending: Obstetrics and Gynecology | Admitting: Obstetrics and Gynecology

## 2020-08-09 ENCOUNTER — Other Ambulatory Visit: Payer: Self-pay

## 2020-08-09 ENCOUNTER — Encounter: Payer: Self-pay | Admitting: Obstetrics and Gynecology

## 2020-08-09 ENCOUNTER — Encounter: Payer: Self-pay | Admitting: Thoracic Surgery (Cardiothoracic Vascular Surgery)

## 2020-08-09 ENCOUNTER — Institutional Professional Consult (permissible substitution): Payer: 59 | Admitting: Thoracic Surgery (Cardiothoracic Vascular Surgery)

## 2020-08-09 VITALS — BP 156/88 | HR 106 | Resp 20 | Ht 65.25 in | Wt 316.0 lb

## 2020-08-09 VITALS — BP 131/85 | HR 90 | Resp 20 | Ht 65.0 in | Wt 310.0 lb

## 2020-08-09 DIAGNOSIS — Q790 Congenital diaphragmatic hernia: Secondary | ICD-10-CM | POA: Diagnosis not present

## 2020-08-09 DIAGNOSIS — Z01419 Encounter for gynecological examination (general) (routine) without abnormal findings: Secondary | ICD-10-CM | POA: Diagnosis not present

## 2020-08-09 DIAGNOSIS — Z124 Encounter for screening for malignant neoplasm of cervix: Secondary | ICD-10-CM

## 2020-08-09 DIAGNOSIS — Z113 Encounter for screening for infections with a predominantly sexual mode of transmission: Secondary | ICD-10-CM | POA: Diagnosis not present

## 2020-08-09 NOTE — Progress Notes (Signed)
[address]       [place] [address]             [phone]                    BECKI MCCASKILL [facility] Medical Record #[id number] Date of Birth: May 20, 1975  Referring: Rollene Rotunda, MD Primary Care: Margaree Mackintosh, MD Primary Cardiologist: Rollene Rotunda, MD  Chief Complaint:    Chief Complaint  Patient presents with  . morgagni hernia    Surgial consult, Chest CT 07/17/20    History of Present Illness:    Rhonda Colon 45 y.o. female presents for surgical evaluation of a diaphragmatic hernia.  She has a long history of chest pain and shortness of breath.  At the age 6 she underwent PCI myocardial infarction.  After that point she changed her diet and exercise pattern was able to 100 pounds.  Unfortunately she has gained back 150 pounds since that point.  She remains very short of breath with short course ambulation, but has always attributed this to her weight.      Past Medical History:  Diagnosis Date  . CAD (coronary artery disease)    a. 07/2011 NSTEMI/Cath: RCA 99%m, otw nl cors, EF 50%, inf hk -> RCA stented with 3.0x23mm Promus DES  . Depression   . Hyperlipidemia   . Morgagni hernia    a. Discovered incidentally on CT 07/2011 ->f/u with Dr. Lindie Spruce  . Obesity   . Sinus bradycardia    a. Nocturnal, asymptomatic  . Sun allergy   . Tobacco abuse    a. quit 07/2011    Past Surgical History:  Procedure Laterality Date  . CARDIAC CATHETERIZATION     left heart, with angiogram  . heart attack  2013  . LEFT HEART CATHETERIZATION WITH CORONARY ANGIOGRAM N/A 08/10/2011   Procedure: LEFT HEART CATHETERIZATION WITH CORONARY ANGIOGRAM;  Surgeon: Kathleene Hazel, MD;  Location: [facility] CATH LAB;  Service: Cardiovascular;  Laterality: N/A;  . TONSILLECTOMY      Family History  Problem Relation Age of Onset  . Coronary artery disease Mother 70        stent  . Heart attack Mother   . Heart disease Mother   . Hyperlipidemia Mother   .  Obesity Mother   . Colon cancer Maternal Uncle   . Arrhythmia Father   . Depression Father   . Coronary artery disease Maternal Aunt 53       (Mother's twin sister)  . Lung cancer Maternal Aunt   . Coronary artery disease Maternal Uncle 46  . Heart attack Maternal Aunt        mother's twin  . Heart attack Maternal Uncle   . Breast cancer Maternal Grandmother   . Cancer Neg Hx   . Early death Neg Hx   . Hypertension Neg Hx   . Kidney disease Neg Hx   . Stroke Neg Hx   . Alcohol abuse Neg Hx   . Diabetes Neg Hx   . Drug abuse Neg Hx      Social History   Tobacco Use  Smoking Status Former Smoker  . Packs/day: 1.00  . Years: 18.00  . Pack years: 18.00  . Types: Cigarettes  . Quit date: 08/09/2011  . Years since quitting: 9.0  Smokeless Tobacco Never Used  Tobacco Comment   2013    Social History   Substance and Sexual Activity  Alcohol Use Yes  . Alcohol/week: 4.0 standard drinks  . Types: 4 Glasses of wine per week   Comment: occassional 4 times aweek      No Known Allergies  Current Outpatient Medications  Medication Sig Dispense Refill  . ALPRAZolam (XANAX) 1 MG tablet Take 1 tablet (1 mg total) by mouth 2 (two) times daily as needed for anxiety. 60 tablet 2  . aspirin 81 MG tablet Take 81 mg by mouth daily.    . cetirizine (ZYRTEC) 10 MG tablet Take 1 tablet (10 mg total) by mouth daily. 30 tablet 11  . FLUoxetine (PROZAC) 40 MG capsule Take 1 capsule (40 mg total) by mouth daily. 90 capsule 3  . lisinopril (ZESTRIL) 10 MG tablet Take 1 tablet (10 mg total) by mouth daily. 90 tablet 3  . Multiple Vitamin (MULTIVITAMIN) capsule Take 1 capsule by mouth daily.    . nitroGLYCERIN (NITROSTAT) 0.4 MG SL tablet Place 1 tablet (0.4 mg total) under the tongue every 5 (five) minutes as needed for chest pain. 25 tablet 3  . nystatin cream (MYCOSTATIN) APPLY 1 APPLICATION OF CREAM TOPICALLY TO AFFECTED AREA TWICE DAILY FOR  UP  TO  7  DAYS 30 g 0  . omeprazole  (PRILOSEC) 20 MG capsule Take 1 capsule (20 mg total) by mouth daily. 30 capsule 3  . rosuvastatin (CRESTOR) 20 MG tablet Take 1 tablet (20 mg total) by mouth daily. 90 tablet 3  . triamcinolone lotion (KENALOG) 0.1 % SMARTSIG:2 Topical Twice Daily     No current facility-administered medications for this visit.    Review of Systems  Constitutional: Positive for malaise/fatigue.  Respiratory: Positive for shortness of breath.   Cardiovascular: Negative for chest pain.  Gastrointestinal: Negative.   Musculoskeletal: Negative.   Neurological: Negative.     PHYSICAL EXAMINATION: BP 131/85   Pulse 90   Resp 20   Ht 5\' 5"  (1.651 m)   Wt (!) 310 lb (140.6 kg)   SpO2 95% Comment: RA  BMI 51.59 kg/m   Physical Exam Constitutional:      General: She is not in acute distress.    Appearance: Normal appearance. She is obese. She is not ill-appearing.  HENT:     Head: Normocephalic and atraumatic.  Eyes:     Extraocular Movements: Extraocular movements intact.  Cardiovascular:     Rate and Rhythm: Normal rate.  Pulmonary:     Effort: Pulmonary effort is normal. No respiratory distress.  Abdominal:     General: There is no distension.  Musculoskeletal:        General: Normal range of motion.     Cervical back: Normal range of motion.  Neurological:     General: No focal deficit present.     Mental Status: She is alert and oriented to person, place, and time.      Diagnostic Studies & Laboratory data:     Recent Radiology Findings:   CT Chest Wo Contrast  Result Date: 07/31/2020 CLINICAL DATA:  Follow-up of Morgagni hernia. EXAM: CT CHEST WITHOUT CONTRAST TECHNIQUE: Multidetector CT imaging of the chest was performed following the standard protocol without IV contrast. COMPARISON:  Aug 09, 2011 FINDINGS: Cardiovascular: Calcified atheromatous plaque in the thoracic aorta. No aneurysmal dilation. Central pulmonary vasculature is normal caliber. Heart size normal without  pericardial effusion. Limited assessment of cardiovascular structures given lack of intravenous contrast. Mediastinum/Nodes: Morgagni hernia containing fat herniates anterior to RIGHT and LEFT hemidiaphragm in the midline anteriorly. Incipient herniation of  the colon into the chest, still below the level of the RIGHT and LEFT hemidiaphragm on the lateral view. Size of the fat containing hernia which extends into the RIGHT chest approximately 16 x 12 cm unchanged compared to previous imaging. Mild mass effect upon the heart with minimal shift of mediastinal structures as before. No axillary lymphadenopathy. No mediastinal adenopathy. No gross hilar adenopathy. Esophagus grossly normal. Lungs/Pleura: No consolidation, effusion, suspicious mass or nodule. Airways are patent with persistent RIGHT lower lobe volume loss. Upper Abdomen: Mildly lobular hepatic contours with signs of hepatic steatosis. Fissural widening. Spleen does not appear particularly enlarged but is incompletely imaged. Imaged portions of adrenal glands, pancreas and gastrointestinal tract aside from high position of the mid transverse colon directed towards the Morgagni hernia are unremarkable. Musculoskeletal: No acute musculoskeletal process. No destructive bone finding. L1 compression fracture incompletely imaged on today's study but present as far back as 2013. IMPRESSION: 1. Largely stable appearance of large Morgagni hernia which extends into the RIGHT chest composed of fat. 2. Incipient herniation of transverse colon into the chest, just below the level of the diaphragm. Attention on follow-up. 3. Mildly lobular hepatic contours with signs of hepatic steatosis. Correlate with any clinical or laboratory evidence of liver disease. 4. Aortic atherosclerosis. Aortic Atherosclerosis (ICD10-I70.0). Electronically Signed   By: Donzetta Kohut M.D.   On: 07/31/2020 10:52       I have independently reviewed the above radiology studies  and reviewed  the findings with the patient.   Recent Lab Findings: Lab Results  Component Value Date   WBC CANCELED 07/30/2020   HGB 14.4 07/30/2020   HCT 43.2 07/30/2020   PLT 272 07/30/2020   GLUCOSE CANCELED 07/30/2020   CHOL 175 06/11/2020   TRIG 92 06/11/2020   HDL 47 06/11/2020   LDLCALC 111 (H) 06/11/2020   ALT 48 (H) 07/30/2020   AST 54 (H) 07/30/2020   NA 140 07/30/2020   K 4.9 07/30/2020   CL 104 07/30/2020   CREATININE 0.74 07/30/2020   BUN 11 07/30/2020   CO2 29 07/30/2020   TSH CANCELED 07/30/2020   INR 0.96 08/10/2011   HGBA1C CANCELED 07/30/2020         Assessment / Plan:   45 year old female with Morgagni hernia, morbid obesity, exertional dyspnea.  On today to simply walk into the room she was obviously short of breath.  We discussed the risks and benefits a robotic assisted diaphragmatic hernia repair.  I think that it would be ideal if she is able to lose about 10 pounds prior to surgery.  I think there is a strong likelihood that the mesh to cover the defect based off of her body habitus.  I am concerned that if she is unable to lose any further weight follow-up with surgery the patient is pretty high.  She seems resolute in her plans to lose weight and for this to be the nidus for her new weight loss goals.  I will see her back in 1 month as a virtual visit to assess the progress of weight loss.  Of note we will obtain PFTs before after surgery for comparison.   Corliss Skains 08/09/2020 4:43 PM

## 2020-08-09 NOTE — Patient Instructions (Signed)
Counselor: Chele Fleming [phone]  EXERCISE   We recommended that you start or continue a regular exercise program for good health. Physical activity is anything that gets your body moving, some is better than none. The CDC recommends 150 minutes per week of Moderate-Intensity Aerobic Activity and 2 or more days of Muscle Strengthening Activity.  Benefits of exercise are limitless: helps weight loss/weight maintenance, improves mood and energy, helps with depression and anxiety, improves sleep, tones and strengthens muscles, improves balance, improves bone density, protects from chronic conditions such as heart disease, high blood pressure and diabetes and so much more. To learn more visit: https://www.cdc.gov/physicalactivity/index.html  DIET: Good nutrition starts with a healthy diet of fruits, vegetables, whole grains, and lean protein sources. Drink plenty of water for hydration. Minimize empty calories, sodium, sweets. For more information about dietary recommendations visit: https://health.gov/our-work/nutrition-physical-activity/dietary-guidelines and https://www.myplate.gov/  ALCOHOL:  Women should limit their alcohol intake to no more than 7 drinks/beers/glasses of wine (combined, not each!) per week. Moderation of alcohol intake to this level decreases your risk of breast cancer and liver damage.  If you are concerned that you may have a problem, or your friends have told you they are concerned about your drinking, there are many resources to help. A well-known program that is free, effective, and available to all people all over the nation is Alcoholics Anonymous.  Check out this site to learn more: https://www.aa.org/   CALCIUM AND VITAMIN D:  Adequate intake of calcium and Vitamin D are recommended for bone health.  You should be getting between 1000-1200 mg of calcium and 800 units of Vitamin D daily between diet and supplements  PAP SMEARS:  Pap smears, to check for cervical cancer  or precancers,  have traditionally been done yearly, scientific advances have shown that most women can have pap smears less often.  However, every woman still should have a physical exam from her gynecologist every year. It will include a breast check, inspection of the vulva and vagina to check for abnormal growths or skin changes, a visual exam of the cervix, and then an exam to evaluate the size and shape of the uterus and ovaries. We will also provide age appropriate advice regarding health maintenance, like when you should have certain vaccines, screening for sexually transmitted diseases, bone density testing, colonoscopy, mammograms, etc.   MAMMOGRAMS:  All women over 40 years old should have a routine mammogram.   COLON CANCER SCREENING: Now recommend starting at age 45. At this time colonoscopy is not covered for routine screening until 50. There are take home tests that can be done between 45-49.   COLONOSCOPY:  Colonoscopy to screen for colon cancer is recommended for all women at age 50.  We know, you hate the idea of the prep.  We agree, BUT, having colon cancer and not knowing it is worse!!  Colon cancer so often starts as a polyp that can be seen and removed at colonscopy, which can quite literally save your life!  And if your first colonoscopy is normal and you have no family history of colon cancer, most women don't have to have it again for 10 years.  Once every ten years, you can do something that may end up saving your life, right?  We will be happy to help you get it scheduled when you are ready.  Be sure to check your insurance coverage so you understand how much it will cost.  It may be covered as a preventative service at no   cost, but you should check your particular policy.      Breast Self-Awareness Breast self-awareness means being familiar with how your breasts look and feel. It involves checking your breasts regularly and reporting any changes to your health care  provider. Practicing breast self-awareness is important. A change in your breasts can be a sign of a serious medical problem. Being familiar with how your breasts look and feel allows you to find any problems early, when treatment is more likely to be successful. All women should practice breast self-awareness, including women who have had breast implants. How to do a breast self-exam One way to learn what is normal for your breasts and whether your breasts are changing is to do a breast self-exam. To do a breast self-exam: Look for Changes  1. Remove all the clothing above your waist. 2. Stand in front of a mirror in a room with good lighting. 3. Put your hands on your hips. 4. Push your hands firmly downward. 5. Compare your breasts in the mirror. Look for differences between them (asymmetry), such as: ? Differences in shape. ? Differences in size. ? Puckers, dips, and bumps in one breast and not the other. 6. Look at each breast for changes in your skin, such as: ? Redness. ? Scaly areas. 7. Look for changes in your nipples, such as: ? Discharge. ? Bleeding. ? Dimpling. ? Redness. ? A change in position. Feel for Changes Carefully feel your breasts for lumps and changes. It is best to do this while lying on your back on the floor and again while sitting or standing in the shower or tub with soapy water on your skin. Feel each breast in the following way:  Place the arm on the side of the breast you are examining above your head.  Feel your breast with the other hand.  Start in the nipple area and make  inch (2 cm) overlapping circles to feel your breast. Use the pads of your three middle fingers to do this. Apply light pressure, then medium pressure, then firm pressure. The light pressure will allow you to feel the tissue closest to the skin. The medium pressure will allow you to feel the tissue that is a little deeper. The firm pressure will allow you to feel the tissue close to  the ribs.  Continue the overlapping circles, moving downward over the breast until you feel your ribs below your breast.  Move one finger-width toward the center of the body. Continue to use the  inch (2 cm) overlapping circles to feel your breast as you move slowly up toward your collarbone.  Continue the up and down exam using all three pressures until you reach your armpit.  Write Down What You Find  Write down what is normal for each breast and any changes that you find. Keep a written record with breast changes or normal findings for each breast. By writing this information down, you do not need to depend only on memory for size, tenderness, or location. Write down where you are in your menstrual cycle, if you are still menstruating. If you are having trouble noticing differences in your breasts, do not get discouraged. With time you will become more familiar with the variations in your breasts and more comfortable with the exam. How often should I examine my breasts? Examine your breasts every month. If you are breastfeeding, the best time to examine your breasts is after a feeding or after using a breast pump. If you   menstruate, the best time to examine your breasts is 5-7 days after your period is over. During your period, your breasts are lumpier, and it may be more difficult to notice changes. When should I see my health care provider? See your health care provider if you notice:  A change in shape or size of your breasts or nipples.  A change in the skin of your breast or nipples, such as a reddened or scaly area.  Unusual discharge from your nipples.  A lump or thick area that was not there before.  Pain in your breasts.  Anything that concerns you.  

## 2020-08-09 NOTE — Progress Notes (Signed)
45 y.o. G0P0000 Significant Other White or Caucasian Not Hispanic or Latino female here for annual exam.  She is separated from her long term partner, still occasionally sexually active.  She had a MI at 83, lost 100 lbs and didn't have a cycle for a year. Currently cycles are mostly every 28-30 days, occasionally will go 6-8 weeks. Heavy for one day. Saturates a regular tampon in 1.5-2 hours. Mild cramps.  Period Duration (Days): 2-3 Period Pattern: (!) Irregular Menstrual Flow: Heavy,Light Menstrual Control: Tampon,Maxi pad Menstrual Control Change Freq (Hours): changes pad/tampon every 2 hours Dysmenorrhea: None   Still having vasomotor symptoms, tolerable.   She is seeing a Chief Strategy Officer today to discuss a hernia in her diaphragm.   Patient's last menstrual period was 07/06/2020.          Sexually active: Yes.    The current method of family planning is w/d (estrogen/progesterone).    Exercising: Yes.    walking Smoker:  Former smoker   Health Maintenance: Pap:  08-02-17 negative  History of abnormal Pap:  no MMG:  11-29-19 density A/BIRADS 1 negative  BMD:   none Colonoscopy: none, she is scheduling screening with her primary.   TDaP:  08-08-13 Gardasil: none    reports that she quit smoking about 9 years ago. Her smoking use included cigarettes. She has a 18.00 pack-year smoking history. She has never used smokeless tobacco. She reports current alcohol use of about 4.0 standard drinks of alcohol per week. She reports that she does not use drugs. Realtor, has her own company. Mom lives locally, on O2, Rosaisela helps take care of her.   Past Medical History:  Diagnosis Date  . CAD (coronary artery disease)    a. 07/2011 NSTEMI/Cath: RCA 99%m, otw nl cors, EF 50%, inf hk -> RCA stented with 3.0x50mm Promus DES  . Depression   . Hyperlipidemia   . Morgagni hernia    a. Discovered incidentally on CT 07/2011 ->f/u with Dr. Lindie Spruce  . Obesity   . Sinus bradycardia    a. Nocturnal,  asymptomatic  . Sun allergy   . Tobacco abuse    a. quit 07/2011    Past Surgical History:  Procedure Laterality Date  . CARDIAC CATHETERIZATION     left heart, with angiogram  . heart attack  2013  . LEFT HEART CATHETERIZATION WITH CORONARY ANGIOGRAM N/A 08/10/2011   Procedure: LEFT HEART CATHETERIZATION WITH CORONARY ANGIOGRAM;  Surgeon: Kathleene Hazel, MD;  Location[facility] CATH LAB;  Service: Cardiovascular;  Laterality: N/A;  . TONSILLECTOMY      Current Outpatient Medications  Medication Sig Dispense Refill  . ALPRAZolam (XANAX) 1 MG tablet Take 1 tablet (1 mg total) by mouth 2 (two) times daily as needed for anxiety. 60 tablet 2  . aspirin 81 MG tablet Take 81 mg by mouth daily.    . cetirizine (ZYRTEC) 10 MG tablet Take 1 tablet (10 mg total) by mouth daily. 30 tablet 11  . FLUoxetine (PROZAC) 40 MG capsule Take 1 capsule (40 mg total) by mouth daily. 90 capsule 3  . lisinopril (ZESTRIL) 10 MG tablet Take 1 tablet (10 mg total) by mouth daily. 90 tablet 3  . Multiple Vitamin (MULTIVITAMIN) capsule Take 1 capsule by mouth daily.    . nitroGLYCERIN (NITROSTAT) 0.4 MG SL tablet Place 1 tablet (0.4 mg total) under the tongue every 5 (five) minutes as needed for chest pain. 25 tablet 3  . nystatin cream (MYCOSTATIN) APPLY 1 APPLICATION OF  CREAM TOPICALLY TO AFFECTED AREA TWICE DAILY FOR  UP  TO  7  DAYS 30 g 0  . omeprazole (PRILOSEC) 20 MG capsule Take 1 capsule (20 mg total) by mouth daily. 30 capsule 3  . rosuvastatin (CRESTOR) 20 MG tablet Take 1 tablet (20 mg total) by mouth daily. 90 tablet 3  . triamcinolone lotion (KENALOG) 0.1 % SMARTSIG:2 Topical Twice Daily     No current facility-administered medications for this visit.    Family History  Problem Relation Age of Onset  . Coronary artery disease Mother 61        stent  . Heart attack Mother   . Heart disease Mother   . Hyperlipidemia Mother   . Obesity Mother   . Colon cancer Maternal Uncle   . Arrhythmia  Father   . Depression Father   . Coronary artery disease Maternal Aunt 23       (Mother's twin sister)  . Lung cancer Maternal Aunt   . Coronary artery disease Maternal Uncle 46  . Heart attack Maternal Aunt        mother's twin  . Heart attack Maternal Uncle   . Breast cancer Maternal Grandmother   . Cancer Neg Hx   . Early death Neg Hx   . Hypertension Neg Hx   . Kidney disease Neg Hx   . Stroke Neg Hx   . Alcohol abuse Neg Hx   . Diabetes Neg Hx   . Drug abuse Neg Hx     Review of Systems  All other systems reviewed and are negative.   Exam:   BP (!) 156/88 (BP Location: Right Arm, Patient Position: Sitting, Cuff Size: Large)   Pulse (!) 106   Resp 20   Ht 5' 5.25" (1.657 m)   Wt (!) 316 lb (143.3 kg)   LMP 07/06/2020   BMI 52.18 kg/m   Weight change: @WEIGHTCHANGE @ Height:   Height: 5' 5.25" (165.7 cm)  Ht Readings from Last 3 Encounters:  08/09/20 5' 5.25" (1.657 m)  08/01/20 5\' 3"  (1.6 m)  06/17/20 5\' 3"  (1.6 m)    General appearance: alert, cooperative and appears stated age Head: Normocephalic, without obvious abnormality, atraumatic Neck: no adenopathy, supple, symmetrical, trachea midline and thyroid normal to inspection and palpation Lungs: clear to auscultation bilaterally Cardiovascular: regular rate and rhythm Breasts: normal appearance, no masses or tenderness Abdomen: soft, non-tender; non distended,  no masses,  no organomegaly Extremities: extremities normal, atraumatic, no cyanosis or edema Skin: Skin color, texture, turgor normal. No rashes or lesions Lymph nodes: Cervical, supraclavicular, and axillary nodes normal. No abnormal inguinal nodes palpated Neurologic: Grossly normal   Pelvic: External genitalia:  no lesions              Urethra:  normal appearing urethra with no masses, tenderness or lesions              Bartholins and Skenes: normal                 Vagina: normal appearing vagina with normal color and discharge, no lesions               Cervix: no lesions               Bimanual Exam:  Uterus:  no masses or tenderness              Adnexa: no mass, fullness, tenderness  Rectovaginal: Confirms               Anus:  normal sphincter tone, no lesions  Arnold Long chaperoned for the exam.  1. Well woman exam Discussed breast self exam Discussed calcium and vit D intake Mammogram in the fall Will do colon cancer screening with her primary  2. Screening for cervical cancer - Cytology - PAP  3. Screening examination for STD (sexually transmitted disease) - Cytology - PAP - RPR - HIV Antibody (routine testing w rflx)

## 2020-08-10 MED ORDER — ALPRAZOLAM 1 MG PO TABS: 1.0000 mg | ORAL_TABLET | Freq: Two times a day (BID) | ORAL | 2 refills | Status: DC | PRN

## 2020-08-10 MED ORDER — OMEPRAZOLE 20 MG PO CPDR: 20.0000 mg | DELAYED_RELEASE_CAPSULE | Freq: Every day | ORAL | 3 refills | Status: DC

## 2020-08-12 LAB — RPR: RPR Ser Ql: NONREACTIVE

## 2020-08-12 LAB — HIV ANTIBODY (ROUTINE TESTING W REFLEX): HIV 1&2 Ab, 4th Generation: NONREACTIVE

## 2020-08-13 LAB — CYTOLOGY - PAP
Chlamydia: NEGATIVE
Comment: NEGATIVE
Comment: NEGATIVE
Comment: NORMAL
Diagnosis: NEGATIVE
High risk HPV: NEGATIVE
Neisseria Gonorrhea: NEGATIVE

## 2020-08-26 ENCOUNTER — Encounter: Payer: Self-pay | Admitting: Internal Medicine

## 2020-08-26 NOTE — Patient Instructions (Signed)
We will check with her about repeating lipid panel liver functions on new dose of Crestor since March by Dr. Antoine Poche.  Encourage diet exercise and weight loss.  Has appointment soon to see Dr. Cliffton Asters.

## 2020-08-30 ENCOUNTER — Telehealth: Payer: Self-pay | Admitting: Internal Medicine

## 2020-08-30 NOTE — Telephone Encounter (Signed)
There is message that Dr. Genevieve Norlander wanted patient on CRESTOR 40mg  please call patient.

## 2020-08-30 NOTE — Telephone Encounter (Signed)
Spoke with patient regarding Crestor dose. Patient was just given new Rx for Crestor 20 mg at her visit few days after her labs in March. Patient will come to office next week or two for repeat labs to determine increasing or staying at 20 mg daily

## 2020-08-30 NOTE — Telephone Encounter (Signed)
Ollie called back to say she is taking Crestor 20 mg.

## 2020-09-16 ENCOUNTER — Telehealth (INDEPENDENT_AMBULATORY_CARE_PROVIDER_SITE_OTHER): Payer: 59 | Admitting: Thoracic Surgery (Cardiothoracic Vascular Surgery)

## 2020-09-16 ENCOUNTER — Other Ambulatory Visit: Payer: Self-pay

## 2020-09-16 DIAGNOSIS — Q790 Congenital diaphragmatic hernia: Secondary | ICD-10-CM

## 2020-09-16 NOTE — Progress Notes (Signed)
     [address]       [place] [address]             [phone]       Patient: Home Provider: Office Consent for Telemedicine visit obtained.  Today's visit was completed via a real-time telehealth (see specific modality noted below). The patient/authorized person provided oral consent at the time of the visit to engage in a telemedicine encounter with the present provider at [facility] The patient/authorized person was informed of the potential benefits, limitations, and risks of telemedicine. The patient/authorized person expressed understanding that the laws that protect confidentiality also apply to telemedicine. The patient/authorized person acknowledged understanding that telemedicine does not provide emergency services and that he or she would need to call 911 or proceed to the nearest hospital for help if such a need arose.   Total time spent in the clinical discussion 10 minutes.  Telehealth Modality: Phone visit (audio only)  I had a telephone visit with Mrs. Stracener.  She did loose 10lbs in the first week, but has gained that back recently.  She continues to have some indigestion, and shortness of breath.  She remains motivated to lose the weight, and undergo surgery, but wants to wait until late August/September for the operation.  I will see her back in mid August for further discussion.  Janith Nielson Keane Scrape

## 2020-09-20 ENCOUNTER — Telehealth: Payer: 59 | Admitting: Thoracic Surgery (Cardiothoracic Vascular Surgery)

## 2020-11-04 LAB — LIPID PANEL
Chol/HDL Ratio: 3.4 ratio (ref 0.0–4.4)
Cholesterol, Total: 186 mg/dL (ref 100–199)
HDL: 55 mg/dL (ref >39)
LDL Chol Calc (NIH): 117 mg/dL — ABNORMAL HIGH (ref 0–99)
Triglycerides: 73 mg/dL (ref 0–149)
VLDL Cholesterol Cal: 14 mg/dL (ref 5–40)

## 2020-11-04 LAB — HEPATIC FUNCTION PANEL
ALT: 44 IU/L — ABNORMAL HIGH (ref 0–32)
AST: 44 IU/L — ABNORMAL HIGH (ref 0–40)
Albumin: 4.2 g/dL (ref 3.8–4.8)
Alkaline Phosphatase: 91 IU/L (ref 44–121)
Bilirubin Total: 0.2 mg/dL (ref 0.0–1.2)
Bilirubin, Direct: <0.1 mg/dL (ref 0.00–0.40)
Total Protein: 7.1 g/dL (ref 6.0–8.5)

## 2020-11-06 ENCOUNTER — Other Ambulatory Visit: Payer: Self-pay

## 2020-11-06 DIAGNOSIS — Z79899 Other long term (current) drug therapy: Secondary | ICD-10-CM

## 2020-11-06 DIAGNOSIS — I2510 Atherosclerotic heart disease of native coronary artery without angina pectoris: Secondary | ICD-10-CM

## 2020-11-06 DIAGNOSIS — E785 Hyperlipidemia, unspecified: Secondary | ICD-10-CM

## 2020-11-06 DIAGNOSIS — Z5181 Encounter for therapeutic drug level monitoring: Secondary | ICD-10-CM

## 2020-11-06 MED ORDER — ROSUVASTATIN CALCIUM 40 MG PO TABS: 40.0000 mg | ORAL_TABLET | Freq: Every day | ORAL | 3 refills | Status: DC

## 2020-11-08 ENCOUNTER — Ambulatory Visit: Payer: 59 | Admitting: Thoracic Surgery (Cardiothoracic Vascular Surgery)

## 2021-01-01 ENCOUNTER — Other Ambulatory Visit: Payer: Self-pay | Admitting: Internal Medicine

## 2021-01-09 ENCOUNTER — Encounter: Payer: Self-pay | Admitting: Internal Medicine

## 2021-01-16 ENCOUNTER — Other Ambulatory Visit: Payer: Self-pay

## 2021-01-16 ENCOUNTER — Ambulatory Visit (INDEPENDENT_AMBULATORY_CARE_PROVIDER_SITE_OTHER): Payer: 59 | Admitting: Internal Medicine

## 2021-01-16 VITALS — BP 128/82 | HR 88 | Temp 99.5°F | Ht 65.0 in | Wt 282.0 lb

## 2021-01-16 DIAGNOSIS — Z1211 Encounter for screening for malignant neoplasm of colon: Secondary | ICD-10-CM

## 2021-01-16 DIAGNOSIS — N949 Unspecified condition associated with female genital organs and menstrual cycle: Secondary | ICD-10-CM | POA: Diagnosis not present

## 2021-01-16 DIAGNOSIS — Z23 Encounter for immunization: Secondary | ICD-10-CM

## 2021-01-16 DIAGNOSIS — N760 Acute vaginitis: Secondary | ICD-10-CM | POA: Diagnosis not present

## 2021-01-16 DIAGNOSIS — B9689 Other specified bacterial agents as the cause of diseases classified elsewhere: Secondary | ICD-10-CM

## 2021-01-16 DIAGNOSIS — F411 Generalized anxiety disorder: Secondary | ICD-10-CM

## 2021-01-16 LAB — POCT WET PREP (WET MOUNT)
Trichomonas Wet Prep HPF POC: ABSENT
WBC, Wet Prep HPF POC: 0

## 2021-01-16 MED ORDER — METRONIDAZOLE 500 MG PO TABS: 500.0000 mg | ORAL_TABLET | Freq: Two times a day (BID) | ORAL | 1 refills | Status: AC

## 2021-01-16 NOTE — Telephone Encounter (Signed)
Patient CB and schedule appointment

## 2021-01-16 NOTE — Progress Notes (Signed)
   Subjective:    Patient ID: Rhonda Colon, female    DOB: [birth year], 45 y.o.   MRN: [id number]  HPI 45 year old Female seen for vaginal discomfort. Initially, patient thought she had UTI. Did home urine test showing positive for WBCs, she says. No frank dysuria. No hematuria.  Is sexually active.  Tells me that mother had severe accident [place]  He has taken some time to recover.  She has been helping her.  This is all been stressful.  She has a history of Morgagni hernia and has seen Dr. Cliffton Asters.  Considering repair.  Tetanus immunization is up-to-date.  Has had 2 Moderna COVID vaccines in 2021.  Flu vaccine January 16, 2021.  History of tonsillectomy.  Fractured ribs 2002.  Fractured spine 1992.  Fractured foot 1988.  History of solar urticaria.  History of allergic rhinitis.  History of hypertension treated with Zestril.  GE reflux treated with PPI.  Is on Lipitor for hyperlipidemia.  More gagging hernia was discovered incidentally on CT in 2013.  History of coronary artery disease status post MI in 2013.  Had 99% right coronary artery occlusion.  Stent was placed in right coronary artery.  She is followed by Dr. Antoine Poche.  History of morbid obesity.  At 1 point she lost 200 pounds and was noted weight 158 pounds when she saw Dr. Antoine Poche in 2014.  At that time she was exercising, not smoking and eating right.  Dr. Oscar La is GYN physician.  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  Patient is a Licensed conveyancer.  Social alcohol consumption.  She is single but has steady female partner.  Family history: History of colon cancer in maternal uncle and great-grandmother.  Maternal aunt died with heart issues and lung cancer in 2019.  Review of Systems no fever, chills, hematuria, significant frequency     Objective:   Physical Exam Blood pressure 128/82 pulse 88 temperature 97.5 degrees pulse oximetry 97% weight 282  pounds height 5 feet 5 inches BMI 46.93  Skin: Warm and dry.  Chest clear.  Cardiac exam: Regular rate and rhythm.  N FDG.  Grayish vaginal discharge noted in vaginal vault.  Wet prep is consistent with clue cells       Assessment & Plan:  Bacterial vaginosis  History of heart disease  Morbid obesity  History of Morgagni hernia seen by Dr. Cliffton Asters. Repair being considered soon.  Situational stress  Plan: Blood pressure is stable.  She will be treated for bacterial vaginosis today with Flagyl 500 mg twice daily for 7 days.  For situational stress and depression remains on Prozac 40 mg daily.  Also takes omeprazole 20 mg daily for GE reflux and Crestor 40 mg daily for history of heart disease per Dr. Antoine Poche.  And Xanax 1 mg twice daily as needed for anxiety.  He is also on lisinopril for hypertension.  Flu vaccine given.

## 2021-01-16 NOTE — Patient Instructions (Addendum)
Flu vaccine given.  Flagyl 500 mg twice daily for 7 days.  After 7 days, use vinegar and water douche once.  Refill given on Flagyl prescription. Counselor recommended for situational stress.  Patient request referral to gastroenterology for colonoscopy.

## 2021-01-21 ENCOUNTER — Encounter: Payer: Self-pay | Admitting: Internal Medicine

## 2021-01-28 HISTORY — PX: DIAPHRAGMATIC HERNIA REPAIR: SUR1167

## 2021-01-31 ENCOUNTER — Encounter: Payer: Self-pay | Admitting: *Deleted

## 2021-01-31 ENCOUNTER — Other Ambulatory Visit: Payer: Self-pay

## 2021-01-31 ENCOUNTER — Ambulatory Visit: Payer: 59 | Admitting: Thoracic Surgery (Cardiothoracic Vascular Surgery)

## 2021-01-31 ENCOUNTER — Other Ambulatory Visit: Payer: Self-pay | Admitting: *Deleted

## 2021-01-31 VITALS — BP 132/82 | HR 108 | Resp 20 | Ht 65.0 in | Wt 280.0 lb

## 2021-01-31 DIAGNOSIS — N760 Acute vaginitis: Secondary | ICD-10-CM | POA: Insufficient documentation

## 2021-01-31 DIAGNOSIS — Q790 Congenital diaphragmatic hernia: Secondary | ICD-10-CM

## 2021-01-31 NOTE — H&P (View-Only) (Signed)
    [address]       [place] [address]             [phone]                    Denell A Alden [place] Medical Record #[id number] Date of Birth: 06/11/1975  Referring: Hochrein, James, MD Primary Care: Baxley, Mary J, MD Primary Cardiologist: James Hochrein, MD  Chief Complaint:    Chief Complaint  Patient presents with   morgagni hernia    6 month f/u    History of Present Illness:    Rhonda Colon 45 y.o. female presents in follow-up to discuss repair of a Morgagni's hernia.  There is not been any significant changes since her last appointment.  She was scheduled to follow-up again August but her mother was hospitalized which postponed her personal needs.  She remains short of breath with exertion.  She denies any abdominal pain.  She continues to have some reflux symptoms.    Past Medical History:  Diagnosis Date   CAD (coronary artery disease)    a. 07/2011 NSTEMI/Cath: RCA 99%m, otw nl cors, EF 50%, inf hk -> RCA stented with 3.0x16mm Promus DES   Depression    Hyperlipidemia    Morgagni hernia    a. Discovered incidentally on CT 07/2011 ->f/u with Dr. Wyatt   Obesity    Sinus bradycardia    a. Nocturnal, asymptomatic   Sun allergy    Tobacco abuse    a. quit 07/2011    Past Surgical History:  Procedure Laterality Date   CARDIAC CATHETERIZATION     left heart, with angiogram   heart attack  2013   LEFT HEART CATHETERIZATION WITH CORONARY ANGIOGRAM N/A 08/10/2011   Procedure: LEFT HEART CATHETERIZATION WITH CORONARY ANGIOGRAM;  Surgeon: Christopher D McAlhany, MD;  Location: [facility] CATH LAB;  Service: Cardiovascular;  Laterality: N/A;   TONSILLECTOMY      Family History  Problem Relation Age of Onset   Coronary artery disease Mother 52        stent   Heart attack Mother    Heart disease Mother    Hyperlipidemia Mother    Obesity Mother    Colon cancer Maternal Uncle    Arrhythmia Father    Depression Father    Coronary artery  disease Maternal Aunt 61       (Mother's twin sister)   Lung cancer Maternal Aunt    Coronary artery disease Maternal Uncle 46   Heart attack Maternal Aunt        mother's twin   Heart attack Maternal Uncle    Breast cancer Maternal Grandmother    Cancer Neg Hx    Early death Neg Hx    Hypertension Neg Hx    Kidney disease Neg Hx    Stroke Neg Hx    Alcohol abuse Neg Hx    Diabetes Neg Hx    Drug abuse Neg Hx      Social History   Tobacco Use  Smoking Status Former   Packs/day: 1.00   Years: 18.00   Pack years: 18.00   Types: Cigarettes   Quit date: 08/09/2011   Years since quitting: 9.4  Smokeless Tobacco Never  Tobacco Comments   2013    Social History   Substance and Sexual Activity  Alcohol Use Yes   Alcohol/week: 4.0 standard drinks   Types: 4 Glasses of wine per week     Comment: occassional 4 times aweek      No Known Allergies  Current Outpatient Medications  Medication Sig Dispense Refill   ALPRAZolam (XANAX) 1 MG tablet Take 1 tablet (1 mg total) by mouth 2 (two) times daily as needed for anxiety. 60 tablet 2   aspirin 81 MG tablet Take 81 mg by mouth daily.     FLUoxetine (PROZAC) 40 MG capsule Take 1 capsule by mouth once daily 90 capsule 0   lisinopril (ZESTRIL) 10 MG tablet Take 1 tablet (10 mg total) by mouth daily. 90 tablet 3   Multiple Vitamin (MULTIVITAMIN) capsule Take 1 capsule by mouth daily.     nitroGLYCERIN (NITROSTAT) 0.4 MG SL tablet Place 1 tablet (0.4 mg total) under the tongue every 5 (five) minutes as needed for chest pain. 25 tablet 3   nystatin cream (MYCOSTATIN) APPLY 1 APPLICATION OF CREAM TOPICALLY TO AFFECTED AREA TWICE DAILY FOR  UP  TO  7  DAYS 30 g 0   omeprazole (PRILOSEC) 20 MG capsule Take 1 capsule by mouth once daily 90 capsule 0   rosuvastatin (CRESTOR) 40 MG tablet Take 1 tablet (40 mg total) by mouth daily. 30 tablet 3   triamcinolone lotion (KENALOG) 0.1 % SMARTSIG:2 Topical Twice Daily     No current  facility-administered medications for this visit.    Review of Systems  Constitutional: Negative.   Respiratory:  Positive for shortness of breath.   Cardiovascular:  Negative for chest pain.  Gastrointestinal:  Positive for heartburn.   PHYSICAL EXAMINATION: BP 132/82   Pulse (!) 108   Resp 20   Ht 5' 5" (1.651 m)   Wt 280 lb (127 kg)   SpO2 95% Comment: RA  BMI 46.59 kg/m   Physical Exam Constitutional:      General: She is not in acute distress.    Appearance: She is obese. She is not ill-appearing.  HENT:     Head: Normocephalic and atraumatic.  Cardiovascular:     Rate and Rhythm: Tachycardia present.  Pulmonary:     Effort: Pulmonary effort is normal. No respiratory distress.  Abdominal:     General: There is no distension.  Musculoskeletal:        General: Normal range of motion.     Cervical back: Normal range of motion.  Skin:    General: Skin is warm and dry.  Neurological:     General: No focal deficit present.     Mental Status: She is alert and oriented to person, place, and time.     Diagnostic Studies & Laboratory data:     Recent Radiology Findings:   No results found.     I have independently reviewed the above radiology studies  and reviewed the findings with the patient.   Recent Lab Findings: Lab Results  Component Value Date   WBC CANCELED 07/30/2020   HGB 14.4 07/30/2020   HCT 43.2 07/30/2020   PLT 272 07/30/2020   GLUCOSE CANCELED 07/30/2020   CHOL 186 11/04/2020   TRIG 73 11/04/2020   HDL 55 11/04/2020   LDLCALC 117 (H) 11/04/2020   ALT 44 (H) 11/04/2020   AST 44 (H) 11/04/2020   NA 140 07/30/2020   K 4.9 07/30/2020   CL 104 07/30/2020   CREATININE 0.74 07/30/2020   BUN 11 07/30/2020   CO2 29 07/30/2020   TSH CANCELED 07/30/2020   INR 0.96 08/10/2011   HGBA1C CANCELED 07/30/2020        Assessment / Plan:     45 year old female with large Morgagni's hernia.  We will review her cross-sectional imaging but mostly  contains fat.  She does not have any recent imaging but her symptoms have not much changed.  She has lost approximately 30 pounds since her last visit.  We have discussed risks and benefits of a robotic assisted diaphragmatic hernia repair.  She is agreeable to proceed.  Given her body habitus and weight this will be repaired with mesh given her high risk of recurrence.      Corliss Skains 01/31/2021 3:25 PM

## 2021-01-31 NOTE — Progress Notes (Signed)
[address]       [place] [address]             [phone]                    Rhonda Colon [facility] Medical Record #[id number] Date of Birth: 08-12-1975  Referring: Rollene Rotunda, MD Primary Care: Margaree Mackintosh, MD Primary Cardiologist: Rollene Rotunda, MD  Chief Complaint:    Chief Complaint  Patient presents with   morgagni hernia    6 month f/u    History of Present Illness:    Rhonda Colon 45 y.o. female presents in follow-up to discuss repair of a Morgagni's hernia.  There is not been any significant changes since her last appointment.  She was scheduled to follow-up again August but her mother was hospitalized which postponed her personal needs.  She remains short of breath with exertion.  She denies any abdominal pain.  She continues to have some reflux symptoms.    Past Medical History:  Diagnosis Date   CAD (coronary artery disease)    a. 07/2011 NSTEMI/Cath: RCA 99%m, otw nl cors, EF 50%, inf hk -> RCA stented with 3.0x35mm Promus DES   Depression    Hyperlipidemia    Morgagni hernia    a. Discovered incidentally on CT 07/2011 ->f/u with Dr. Lindie Spruce   Obesity    Sinus bradycardia    a. Nocturnal, asymptomatic   Sun allergy    Tobacco abuse    a. quit 07/2011    Past Surgical History:  Procedure Laterality Date   CARDIAC CATHETERIZATION     left heart, with angiogram   heart attack  2013   LEFT HEART CATHETERIZATION WITH CORONARY ANGIOGRAM N/A 08/10/2011   Procedure: LEFT HEART CATHETERIZATION WITH CORONARY ANGIOGRAM;  Surgeon: Kathleene Hazel, MD;  Location: [facility] CATH LAB;  Service: Cardiovascular;  Laterality: N/A;   TONSILLECTOMY      Family History  Problem Relation Age of Onset   Coronary artery disease Mother 31        stent   Heart attack Mother    Heart disease Mother    Hyperlipidemia Mother    Obesity Mother    Colon cancer Maternal Uncle    Arrhythmia Father    Depression Father    Coronary artery  disease Maternal Aunt 72       (Mother's twin sister)   Lung cancer Maternal Aunt    Coronary artery disease Maternal Uncle 46   Heart attack Maternal Aunt        mother's twin   Heart attack Maternal Uncle    Breast cancer Maternal Grandmother    Cancer Neg Hx    Early death Neg Hx    Hypertension Neg Hx    Kidney disease Neg Hx    Stroke Neg Hx    Alcohol abuse Neg Hx    Diabetes Neg Hx    Drug abuse Neg Hx      Social History   Tobacco Use  Smoking Status Former   Packs/day: 1.00   Years: 18.00   Pack years: 18.00   Types: Cigarettes   Quit date: 08/09/2011   Years since quitting: 9.4  Smokeless Tobacco Never  Tobacco Comments   2013    Social History   Substance and Sexual Activity  Alcohol Use Yes   Alcohol/week: 4.0 standard drinks   Types: 4 Glasses of wine per week  Comment: occassional 4 times aweek      No Known Allergies  Current Outpatient Medications  Medication Sig Dispense Refill   ALPRAZolam (XANAX) 1 MG tablet Take 1 tablet (1 mg total) by mouth 2 (two) times daily as needed for anxiety. 60 tablet 2   aspirin 81 MG tablet Take 81 mg by mouth daily.     FLUoxetine (PROZAC) 40 MG capsule Take 1 capsule by mouth once daily 90 capsule 0   lisinopril (ZESTRIL) 10 MG tablet Take 1 tablet (10 mg total) by mouth daily. 90 tablet 3   Multiple Vitamin (MULTIVITAMIN) capsule Take 1 capsule by mouth daily.     nitroGLYCERIN (NITROSTAT) 0.4 MG SL tablet Place 1 tablet (0.4 mg total) under the tongue every 5 (five) minutes as needed for chest pain. 25 tablet 3   nystatin cream (MYCOSTATIN) APPLY 1 APPLICATION OF CREAM TOPICALLY TO AFFECTED AREA TWICE DAILY FOR  UP  TO  7  DAYS 30 g 0   omeprazole (PRILOSEC) 20 MG capsule Take 1 capsule by mouth once daily 90 capsule 0   rosuvastatin (CRESTOR) 40 MG tablet Take 1 tablet (40 mg total) by mouth daily. 30 tablet 3   triamcinolone lotion (KENALOG) 0.1 % SMARTSIG:2 Topical Twice Daily     No current  facility-administered medications for this visit.    Review of Systems  Constitutional: Negative.   Respiratory:  Positive for shortness of breath.   Cardiovascular:  Negative for chest pain.  Gastrointestinal:  Positive for heartburn.   PHYSICAL EXAMINATION: BP 132/82   Pulse (!) 108   Resp 20   Ht 5\' 5"  (1.651 m)   Wt 280 lb (127 kg)   SpO2 95% Comment: RA  BMI 46.59 kg/m   Physical Exam Constitutional:      General: She is not in acute distress.    Appearance: She is obese. She is not ill-appearing.  HENT:     Head: Normocephalic and atraumatic.  Cardiovascular:     Rate and Rhythm: Tachycardia present.  Pulmonary:     Effort: Pulmonary effort is normal. No respiratory distress.  Abdominal:     General: There is no distension.  Musculoskeletal:        General: Normal range of motion.     Cervical back: Normal range of motion.  Skin:    General: Skin is warm and dry.  Neurological:     General: No focal deficit present.     Mental Status: She is alert and oriented to person, place, and time.     Diagnostic Studies & Laboratory data:     Recent Radiology Findings:   No results found.     I have independently reviewed the above radiology studies  and reviewed the findings with the patient.   Recent Lab Findings: Lab Results  Component Value Date   WBC CANCELED 07/30/2020   HGB 14.4 07/30/2020   HCT 43.2 07/30/2020   PLT 272 07/30/2020   GLUCOSE CANCELED 07/30/2020   CHOL 186 11/04/2020   TRIG 73 11/04/2020   HDL 55 11/04/2020   LDLCALC 117 (H) 11/04/2020   ALT 44 (H) 11/04/2020   AST 44 (H) 11/04/2020   NA 140 07/30/2020   K 4.9 07/30/2020   CL 104 07/30/2020   CREATININE 0.74 07/30/2020   BUN 11 07/30/2020   CO2 29 07/30/2020   TSH CANCELED 07/30/2020   INR 0.96 08/10/2011   HGBA1C CANCELED 07/30/2020        Assessment / Plan:  45 year old female with large Morgagni's hernia.  We will review her cross-sectional imaging but mostly  contains fat.  She does not have any recent imaging but her symptoms have not much changed.  She has lost approximately 30 pounds since her last visit.  We have discussed risks and benefits of a robotic assisted diaphragmatic hernia repair.  She is agreeable to proceed.  Given her body habitus and weight this will be repaired with mesh given her high risk of recurrence.      Corliss Skains 01/31/2021 3:25 PM

## 2021-02-18 NOTE — Pre-Procedure Instructions (Signed)
Surgical Instructions    Your procedure is scheduled on Monday 02/24/21.   Report to [facility] Main Entrance "A" at 05:30 A.M., then check in with the Admitting office.  Call this number if you have problems the morning of surgery:  [phone]   If you have any questions prior to your surgery date call [phone]: Open Monday-Friday 8am-4pm    Remember:  Do not eat or drink after midnight the night before your surgery     Take these medicines the morning of surgery with A SIP OF WATER   FLUoxetine (PROZAC)   omeprazole (PRILOSEC)   rosuvastatin (CRESTOR)    Take these medicines if needed:   ALPRAZolam Prudy Feeler)  nitroGLYCERIN (NITROSTAT)  Please follow your surgeon's instructions regarding Aspirin. If you have not received instructions then please contact your surgeon's office for instructions.   As of today, STOP taking any Aspirin (unless otherwise instructed by your surgeon) Aleve, Naproxen, Ibuprofen, Motrin, Advil, Goody's, BC's, all herbal medications, fish oil, and all vitamins.     After your COVID test   You are not required to quarantine however you are required to wear a well-fitting mask when you are out and around people not in your household.  If your mask becomes wet or soiled, replace with a new one.  Wash your hands often with soap and water for 20 seconds or clean your hands with an alcohol-based hand sanitizer that contains at least 60% alcohol.  Do not share personal items.  Notify your provider: if you are in close contact with someone who has COVID  or if you develop a fever of 100.4 or greater, sneezing, cough, sore throat, shortness of breath or body aches.             Do not wear jewelry or makeup Do not wear lotions, powders, perfumes, or deodorant. Do not shave 48 hours prior to surgery.   Do not bring valuables to the hospital. DO Not wear nail polish, gel polish, artificial nails, or any other type of covering on natural nails  including finger and toenails. If patients have artificial nails, gel coating, etc. that need to be removed by a nail salon, please have this removed prior to surgery or surgery may need to be canceled/delayed if the surgeon/ anesthesia feels like the patient is unable to be adequately monitored.             [facility] is not responsible for any belongings or valuables.  Do NOT Smoke (Tobacco/Vaping)  24 hours prior to your procedure  If you use a CPAP at night, you may bring your mask for your overnight stay.   Contacts, glasses, hearing aids, dentures or partials may not be worn into surgery, please bring cases for these belongings   For patients admitted to the hospital, discharge time will be determined by your treatment team.   Patients discharged the day of surgery will not be allowed to drive home, and someone needs to stay with them for 24 hours.  NO VISITORS WILL BE ALLOWED IN PRE-OP WHERE PATIENTS ARE PREPPED FOR SURGERY.  ONLY 1 SUPPORT PERSON MAY BE PRESENT IN THE WAITING ROOM WHILE YOU ARE IN SURGERY.  IF YOU ARE TO BE ADMITTED, ONCE YOU ARE IN YOUR ROOM YOU WILL BE ALLOWED TWO (2) VISITORS. 1 (ONE) VISITOR MAY STAY OVERNIGHT BUT MUST ARRIVE TO THE ROOM BY 8pm.  Minor children may have two parents present. Special consideration for safety and communication needs will be reviewed  on a case by case basis.  Special instructions:    Oral Hygiene is also important to reduce your risk of infection.  Remember - BRUSH YOUR TEETH THE MORNING OF SURGERY WITH YOUR REGULAR TOOTHPASTE   [facility]- Preparing For Surgery  Before surgery, you can play an important role. Because skin is not sterile, your skin needs to be as free of germs as possible. You can reduce the number of germs on your skin by washing with CHG (chlorahexidine gluconate) Soap before surgery.  CHG is an antiseptic cleaner which kills germs and bonds with the skin to continue killing germs even after washing.     Please  do not use if you have an allergy to CHG or antibacterial soaps. If your skin becomes reddened/irritated stop using the CHG.  Do not shave (including legs and underarms) for at least 48 hours prior to first CHG shower. It is OK to shave your face.  Please follow these instructions carefully.     Shower the NIGHT BEFORE SURGERY and the MORNING OF SURGERY with CHG Soap.   If you chose to wash your hair, wash your hair first as usual with your normal shampoo. After you shampoo, rinse your hair and body thoroughly to remove the shampoo.  Then [employer] and genitals (private parts) with your normal soap and rinse thoroughly to remove soap.  After that Use CHG Soap as you would any other liquid soap. You can apply CHG directly to the skin and wash gently with a scrungie or a clean washcloth.   Apply the CHG Soap to your body ONLY FROM THE NECK DOWN.  Do not use on open wounds or open sores. Avoid contact with your eyes, ears, mouth and genitals (private parts). Wash Face and genitals (private parts)  with your normal soap.   Wash thoroughly, paying special attention to the area where your surgery will be performed.  Thoroughly rinse your body with warm water from the neck down.  DO NOT shower/wash with your normal soap after using and rinsing off the CHG Soap.  Pat yourself dry with a CLEAN TOWEL.  Wear CLEAN PAJAMAS to bed the night before surgery  Place CLEAN SHEETS on your bed the night before your surgery  DO NOT SLEEP WITH PETS.   Day of Surgery:  Take a shower with CHG soap. Wear Clean/Comfortable clothing the morning of surgery Do not apply any deodorants/lotions.   Remember to brush your teeth WITH YOUR REGULAR TOOTHPASTE.   Please read over the following fact sheets that you were given.

## 2021-02-19 ENCOUNTER — Other Ambulatory Visit: Payer: Self-pay | Admitting: *Deleted

## 2021-02-19 MED ORDER — PEG 3350-KCL-NABCB-NACL-NASULF 236 G PO SOLR: 4000.0000 mL | Freq: Once | ORAL | 0 refills | Status: AC

## 2021-02-19 NOTE — Progress Notes (Signed)
Anesthesia Chart Review:  Case: [id number] Date/Time: 02/24/21 0715   Procedure: XI ROBOTIC ASSISTED REPAIR OF DIAPHRAGMATIC HERNIA (Chest)   Anesthesia type: General   Pre-op diagnosis: DIAPHRAGMATIC HERNIA   Location: [facility] ROOM 10 / [facility]   Surgeons: Corliss Skains, MD       DISCUSSION: Patient is a 45 year old female scheduled for the above procedure.  History includes former smoker (quit 08/09/11), CAD (NSTEMI, s/p DES mid RCA 08/10/11), HTN, sinus bradycardia, "sun allergy", Morgagni hernia, morbid obesity.  Last cardiology visit with Dr. Antoine Poche on 06/17/20.  She has chronic dyspnea which is felt to be multifactorial with her weight as well as large diaphragmatic hernia with compression atelectasis of the lungs.  He referred her to CT surgery for consideration of repair.  In regards to CAD, she had no new symptoms, so no further cardiovascular testing felt indicated at that time. One year follow-up planned.   02/21/21 presurgical COVID-19 test in process. Anesthesia team to evaluate on the day of surgery. For pregnancy test on arrival.     VS: BP (!) 147/86   Pulse 92   Temp 36.6 C (Oral)   Resp 18   Ht 5\' 5"  (1.651 m)   Wt (!) 145.1 kg   LMP 10/02/2020   SpO2 98%   BMI 53.23 kg/m    PROVIDERS: 12/03/2020, MD is PCP  Margaree Mackintosh, MD is cardiologist   LABS: Labs reviewed: Acceptable for surgery.  AST 58, ALT 58, but consistent with previous labs. PT/PTT and PLT count normal. A1c 5.6% 07/30/20.  (all labs ordered are listed, but only abnormal results are displayed)  Labs Reviewed  CBC - Abnormal; Notable for the following components:      Result Value   HCT 46.4 (*)    All other components within normal limits  COMPREHENSIVE METABOLIC PANEL - Abnormal; Notable for the following components:   Glucose, Bld 107 (*)    Albumin 3.3 (*)    AST 58 (*)    ALT 58 (*)    All other components within normal limits  SARS CORONAVIRUS 2 (TAT 6-24 HRS)  SURGICAL PCR  SCREEN  PROTIME-INR  APTT  URINALYSIS, ROUTINE W REFLEX MICROSCOPIC  TYPE AND SCREEN    IMAGES: CXR 02/21/21: IMPRESSION: Stable large fat containing Morgagni hernia is noted in the right hemithorax anteriorly which results in atelectasis of the right middle lobe.   CT Chest 07/30/20: IMPRESSION: 1. Largely stable appearance of large Morgagni hernia which extends into the RIGHT chest composed of fat. 2. Incipient herniation of transverse colon into the chest, just below the level of the diaphragm. Attention on follow-up. 3. Mildly lobular hepatic contours with signs of hepatic steatosis. Correlate with any clinical or laboratory evidence of liver disease. 4. Aortic atherosclerosis.    EKG:  EKG 02/21/21:  Normal sinus rhythm Cannot rule out Anterior infarct , age undetermined Abnormal ECG  EKG 06/17/20: Normal sinus rhythm.  Low voltage QRS.  Nonspecific T wave abnormality.   CV: Cardiac cath 08/10/11: Angiographic Findings: Left main:  No obstructive disease.  Left Anterior Descending Artery: Large caliber vessel that courses to the apex. Moderate sized diagonal branch. No obstructive disease noted.  Circumflex Artery: Moderate sized artery with a moderate sized OM branch. No obstructive disease noted.  Right Coronary Artery: Large, dominant vessel with 99% mid vessel stenosis. The remainder of the vessel is free of any significant disease.  Left Ventricular Angiogram: LVEF 50% with inferior wall hypokinesis.  Impression: 1.  Single vessel CAD with severe stenosis mid RCA 2.  Mild LV systolic dysfunction 3.  Successful PTCA/DES x 1 mid RCA   Echo 08/09/11: Study Conclusions  - Left ventricle: The cavity size was normal. Systolic    function was normal. The estimated ejection fraction was    in the range of 55% to 60%. Regional wall motion    abnormalities cannot be excluded. Doppler parameters are    consistent with abnormal left ventricular relaxation    (grade 1  diastolic dysfunction).  - Left atrium: The atrium was mildly dilated.    Past Medical History:  Diagnosis Date   CAD (coronary artery disease)    a. 07/2011 NSTEMI/Cath: RCA 99%m, otw nl cors, EF 50%, inf hk -> RCA stented with 3.0x41mm Promus DES   Depression    Hyperlipidemia    Hypertension    Morgagni hernia    a. Discovered incidentally on CT 07/2011 ->f/u with Dr. Lindie Spruce   Myocardial infarction [facility])    Obesity    Sinus bradycardia    a. Nocturnal, asymptomatic   Sun allergy    Tobacco abuse    a. quit 07/2011    Past Surgical History:  Procedure Laterality Date   CARDIAC CATHETERIZATION     left heart, with angiogram   heart attack  2013   LEFT HEART CATHETERIZATION WITH CORONARY ANGIOGRAM N/A 08/10/2011   Procedure: LEFT HEART CATHETERIZATION WITH CORONARY ANGIOGRAM;  Surgeon: Kathleene Hazel, MD;  Location: [facility] CATH LAB;  Service: Cardiovascular;  Laterality: N/A;   TONSILLECTOMY      MEDICATIONS:  ALPRAZolam (XANAX) 1 MG tablet   aspirin 81 MG tablet   FLUoxetine (PROZAC) 40 MG capsule   lisinopril (ZESTRIL) 10 MG tablet   Multiple Vitamin (MULTIVITAMIN) capsule   nitroGLYCERIN (NITROSTAT) 0.4 MG SL tablet   nystatin cream (MYCOSTATIN)   omeprazole (PRILOSEC) 20 MG capsule   rosuvastatin (CRESTOR) 40 MG tablet   No current facility-administered medications for this encounter.  Instructed to follow surgeon instructions regarding perioperative ASA.   Shonna Chock, PA-C Surgical Short Stay/Anesthesiology [facility] Phone [phone] [facility] Phone [phone] 02/21/2021 12:35 PM

## 2021-02-21 ENCOUNTER — Encounter (HOSPITAL_COMMUNITY)
Admission: RE | Admit: 2021-02-21 | Discharge: 2021-02-21 | Disposition: A | Discharge status: Home / Self Care | Payer: 59 | Source: Ambulatory Visit | Attending: Thoracic Surgery (Cardiothoracic Vascular Surgery) | Admitting: Thoracic Surgery (Cardiothoracic Vascular Surgery)

## 2021-02-21 ENCOUNTER — Ambulatory Visit (HOSPITAL_COMMUNITY)
Admission: RE | Admit: 2021-02-21 | Discharge: 2021-02-21 | Disposition: A | Discharge status: Home / Self Care | Payer: 59 | Source: Ambulatory Visit | Attending: Thoracic Surgery (Cardiothoracic Vascular Surgery) | Admitting: Thoracic Surgery (Cardiothoracic Vascular Surgery)

## 2021-02-21 ENCOUNTER — Other Ambulatory Visit: Payer: Self-pay

## 2021-02-21 ENCOUNTER — Encounter (HOSPITAL_COMMUNITY): Payer: Self-pay

## 2021-02-21 VITALS — BP 147/86 | HR 92 | Temp 97.9°F | Resp 18 | Ht 65.0 in | Wt 319.9 lb

## 2021-02-21 DIAGNOSIS — Z20822 Contact with and (suspected) exposure to covid-19: Secondary | ICD-10-CM | POA: Insufficient documentation

## 2021-02-21 DIAGNOSIS — K449 Diaphragmatic hernia without obstruction or gangrene: Secondary | ICD-10-CM | POA: Insufficient documentation

## 2021-02-21 DIAGNOSIS — Z01818 Encounter for other preprocedural examination: Secondary | ICD-10-CM

## 2021-02-21 DIAGNOSIS — I700 Atherosclerosis of aorta: Secondary | ICD-10-CM | POA: Insufficient documentation

## 2021-02-21 DIAGNOSIS — J9811 Atelectasis: Secondary | ICD-10-CM | POA: Diagnosis not present

## 2021-02-21 DIAGNOSIS — Z955 Presence of coronary angioplasty implant and graft: Secondary | ICD-10-CM | POA: Diagnosis not present

## 2021-02-21 DIAGNOSIS — Z6841 Body Mass Index (BMI) 40.0 and over, adult: Secondary | ICD-10-CM | POA: Diagnosis not present

## 2021-02-21 DIAGNOSIS — Q790 Congenital diaphragmatic hernia: Secondary | ICD-10-CM

## 2021-02-21 DIAGNOSIS — I252 Old myocardial infarction: Secondary | ICD-10-CM | POA: Insufficient documentation

## 2021-02-21 DIAGNOSIS — I2510 Atherosclerotic heart disease of native coronary artery without angina pectoris: Secondary | ICD-10-CM | POA: Insufficient documentation

## 2021-02-21 DIAGNOSIS — I10 Essential (primary) hypertension: Secondary | ICD-10-CM | POA: Insufficient documentation

## 2021-02-21 HISTORY — DX: Essential (primary) hypertension: I10

## 2021-02-21 HISTORY — DX: Acute myocardial infarction, unspecified: I21.9

## 2021-02-21 LAB — CBC
HCT: 46.4 % — ABNORMAL HIGH (ref 36.0–46.0)
Hemoglobin: 14.8 g/dL (ref 12.0–15.0)
MCH: 30.3 pg (ref 26.0–34.0)
MCHC: 31.9 g/dL (ref 30.0–36.0)
MCV: 95.1 fL (ref 80.0–100.0)
Platelets: 319 10*3/uL (ref 150–400)
RBC: 4.88 MIL/uL (ref 3.87–5.11)
RDW: 13.2 % (ref 11.5–15.5)
WBC: 9.5 10*3/uL (ref 4.0–10.5)
nRBC: 0 % (ref 0.0–0.2)

## 2021-02-21 LAB — COMPREHENSIVE METABOLIC PANEL
ALT: 58 U/L — ABNORMAL HIGH (ref 0–44)
AST: 58 U/L — ABNORMAL HIGH (ref 15–41)
Albumin: 3.3 g/dL — ABNORMAL LOW (ref 3.5–5.0)
Alkaline Phosphatase: 72 U/L (ref 38–126)
Anion gap: 8 (ref 5–15)
BUN: 9 mg/dL (ref 6–20)
CO2: 23 mmol/L (ref 22–32)
Calcium: 9 mg/dL (ref 8.9–10.3)
Chloride: 107 mmol/L (ref 98–111)
Creatinine, Ser: 0.64 mg/dL (ref 0.44–1.00)
GFR, Estimated: >60 mL/min (ref >60)
Glucose, Bld: 107 mg/dL — ABNORMAL HIGH (ref 70–99)
Potassium: 4.1 mmol/L (ref 3.5–5.1)
Sodium: 138 mmol/L (ref 135–145)
Total Bilirubin: 0.7 mg/dL (ref 0.3–1.2)
Total Protein: 6.7 g/dL (ref 6.5–8.1)

## 2021-02-21 LAB — URINALYSIS, ROUTINE W REFLEX MICROSCOPIC
Bilirubin Urine: NEGATIVE
Glucose, UA: NEGATIVE mg/dL
Hgb urine dipstick: NEGATIVE
Ketones, ur: NEGATIVE mg/dL
Leukocytes,Ua: NEGATIVE
Nitrite: NEGATIVE
Protein, ur: NEGATIVE mg/dL
Specific Gravity, Urine: 1.015 (ref 1.005–1.030)
pH: 5 (ref 5.0–8.0)

## 2021-02-21 LAB — TYPE AND SCREEN
ABO/RH(D): O POS
Antibody Screen: NEGATIVE

## 2021-02-21 LAB — PROTIME-INR
INR: 1 (ref 0.8–1.2)
Prothrombin Time: 13 seconds (ref 11.4–15.2)

## 2021-02-21 LAB — SURGICAL PCR SCREEN
MRSA, PCR: NEGATIVE
Staphylococcus aureus: NEGATIVE

## 2021-02-21 LAB — APTT: aPTT: 28 seconds (ref 24–36)

## 2021-02-21 LAB — SARS CORONAVIRUS 2 (TAT 6-24 HRS): SARS Coronavirus 2: NEGATIVE

## 2021-02-21 NOTE — Anesthesia Preprocedure Evaluation (Addendum)
Anesthesia Evaluation  Patient identified by MRN, date of birth, ID band Patient awake    Reviewed: Allergy & Precautions, NPO status , Patient's Chart, lab work & pertinent test results  History of Anesthesia Complications Negative for: history of anesthetic complications  Airway Mallampati: III  TM Distance: >3 FB Neck ROM: Full    Dental  (+) Dental Advisory Given, Teeth Intact   Pulmonary former smoker,    Pulmonary exam normal        Cardiovascular hypertension, Pt. on medications + CAD, + Past MI and + Cardiac Stents  Normal cardiovascular exam     Neuro/Psych PSYCHIATRIC DISORDERS Depression negative neurological ROS     GI/Hepatic Neg liver ROS, hiatal hernia, GERD  Medicated and Controlled,  Endo/Other  Morbid obesity  Renal/GU negative Renal ROS     Musculoskeletal negative musculoskeletal ROS (+)   Abdominal   Peds  Hematology negative hematology ROS (+)   Anesthesia Other Findings   Reproductive/Obstetrics                           Anesthesia Physical Anesthesia Plan  ASA: 3  Anesthesia Plan: General   Post-op Pain Management: Tylenol PO (pre-op) and Celebrex PO (pre-op)   Induction: Intravenous  PONV Risk Score and Plan: 4 or greater and Treatment may vary due to age or medical condition, Ondansetron, Midazolam, Scopolamine patch - Pre-op and Dexamethasone  Airway Management Planned: Oral ETT and Double Lumen EBT  Additional Equipment: None  Intra-op Plan:   Post-operative Plan: Possible Post-op intubation/ventilation  Informed Consent: I have reviewed the patients History and Physical, chart, labs and discussed the procedure including the risks, benefits and alternatives for the proposed anesthesia with the patient or authorized representative who has indicated his/her understanding and acceptance.     Dental advisory given  Plan Discussed with: CRNA and  Anesthesiologist  Anesthesia Plan Comments: (ETT vs DLT, confirm with surgeon. Surgeon requesting clearsight )    Anesthesia Quick Evaluation

## 2021-02-21 NOTE — Progress Notes (Signed)
PCP - Dr. Marlan Palau Cardiologist - Dr. Rollene Rotunda  PPM/ICD - Denies  Chest x-ray - 02/21/21 EKG - 02/21/21 Stress Test - Denies ECHO - 08/09/11 Cardiac Cath - Denies  Diabetic: Denies  Sleep Study - Denies  Blood Thinner Instructions: N/A Aspirin Instructions: Follow surgeon's instructions on when to stop ASA.  ERAS Protcol - No  COVID TEST- 02/21/21 in PAT   Anesthesia review: Yes, cardiac hx  Patient denies shortness of breath, fever, cough and chest pain at PAT appointment   All instructions explained to the patient, with a verbal understanding of the material. Patient agrees to go over the instructions while at home for a better understanding. Patient also instructed to self quarantine after being tested for COVID-19. The opportunity to ask questions was provided.

## 2021-02-23 ENCOUNTER — Encounter: Payer: Self-pay | Admitting: Internal Medicine

## 2021-02-24 ENCOUNTER — Encounter (HOSPITAL_COMMUNITY)
Admission: RE | Disposition: A | Discharge status: Home / Self Care | Payer: Self-pay | Source: Home / Self Care | Attending: Thoracic Surgery (Cardiothoracic Vascular Surgery)

## 2021-02-24 ENCOUNTER — Other Ambulatory Visit: Payer: Self-pay

## 2021-02-24 ENCOUNTER — Inpatient Hospital Stay (HOSPITAL_COMMUNITY)
Admission: RE | Admit: 2021-02-24 | Discharge: 2021-02-25 | DRG: 327 | Disposition: A | Discharge status: Home / Self Care | Payer: 59 | Attending: Thoracic Surgery (Cardiothoracic Vascular Surgery) | Admitting: Thoracic Surgery (Cardiothoracic Vascular Surgery)

## 2021-02-24 ENCOUNTER — Encounter (HOSPITAL_COMMUNITY): Payer: Self-pay | Admitting: Thoracic Surgery (Cardiothoracic Vascular Surgery)

## 2021-02-24 ENCOUNTER — Inpatient Hospital Stay (HOSPITAL_COMMUNITY): Payer: 59 | Admitting: Vascular Surgery

## 2021-02-24 ENCOUNTER — Inpatient Hospital Stay (HOSPITAL_COMMUNITY): Payer: 59 | Admitting: Certified Registered Nurse Anesthetist

## 2021-02-24 DIAGNOSIS — I252 Old myocardial infarction: Secondary | ICD-10-CM

## 2021-02-24 DIAGNOSIS — Z800 Family history of malignant neoplasm of digestive organs: Secondary | ICD-10-CM

## 2021-02-24 DIAGNOSIS — Z09 Encounter for follow-up examination after completed treatment for conditions other than malignant neoplasm: Secondary | ICD-10-CM

## 2021-02-24 DIAGNOSIS — Z87891 Personal history of nicotine dependence: Secondary | ICD-10-CM

## 2021-02-24 DIAGNOSIS — Z801 Family history of malignant neoplasm of trachea, bronchus and lung: Secondary | ICD-10-CM

## 2021-02-24 DIAGNOSIS — K449 Diaphragmatic hernia without obstruction or gangrene: Principal | ICD-10-CM

## 2021-02-24 DIAGNOSIS — E785 Hyperlipidemia, unspecified: Secondary | ICD-10-CM | POA: Diagnosis present

## 2021-02-24 DIAGNOSIS — I2510 Atherosclerotic heart disease of native coronary artery without angina pectoris: Secondary | ICD-10-CM | POA: Diagnosis present

## 2021-02-24 DIAGNOSIS — Z83438 Family history of other disorder of lipoprotein metabolism and other lipidemia: Secondary | ICD-10-CM

## 2021-02-24 DIAGNOSIS — Z8249 Family history of ischemic heart disease and other diseases of the circulatory system: Secondary | ICD-10-CM

## 2021-02-24 DIAGNOSIS — Z79899 Other long term (current) drug therapy: Secondary | ICD-10-CM

## 2021-02-24 DIAGNOSIS — Z803 Family history of malignant neoplasm of breast: Secondary | ICD-10-CM

## 2021-02-24 DIAGNOSIS — Z6841 Body Mass Index (BMI) 40.0 and over, adult: Secondary | ICD-10-CM

## 2021-02-24 DIAGNOSIS — Q790 Congenital diaphragmatic hernia: Secondary | ICD-10-CM

## 2021-02-24 DIAGNOSIS — Z7982 Long term (current) use of aspirin: Secondary | ICD-10-CM

## 2021-02-24 DIAGNOSIS — Z818 Family history of other mental and behavioral disorders: Secondary | ICD-10-CM

## 2021-02-24 HISTORY — PX: XI ROBOTIC ASSISTED REPAIR OF DIAPHRAGMATIC HERNIA: SHX6865

## 2021-02-24 LAB — CREATININE, SERUM
Creatinine, Ser: 0.74 mg/dL (ref 0.44–1.00)
GFR, Estimated: >60 mL/min (ref >60)

## 2021-02-24 LAB — CBC
HCT: 42.8 % (ref 36.0–46.0)
Hemoglobin: 14.5 g/dL (ref 12.0–15.0)
MCH: 31.4 pg (ref 26.0–34.0)
MCHC: 33.9 g/dL (ref 30.0–36.0)
MCV: 92.6 fL (ref 80.0–100.0)
Platelets: 304 10*3/uL (ref 150–400)
RBC: 4.62 MIL/uL (ref 3.87–5.11)
RDW: 12.9 % (ref 11.5–15.5)
WBC: 12.4 10*3/uL — ABNORMAL HIGH (ref 4.0–10.5)
nRBC: 0 % (ref 0.0–0.2)

## 2021-02-24 LAB — ABO/RH: ABO/RH(D): O POS

## 2021-02-24 LAB — POCT PREGNANCY, URINE: Preg Test, Ur: NEGATIVE

## 2021-02-24 SURGERY — XI ROBOTIC ASSISTED REPAIR OF DIAPHRAGMATIC HERNIA
Anesthesia: General | Site: Chest

## 2021-02-24 MED ORDER — ONDANSETRON 4 MG PO TBDP: 4.0000 mg | ORAL_TABLET | Freq: Four times a day (QID) | ORAL | Status: DC | PRN

## 2021-02-24 MED ORDER — ASPIRIN EC 81 MG PO TBEC
81.0000 mg | DELAYED_RELEASE_TABLET | Freq: Every day | ORAL | Status: DC
  Administered 2021-02-24 – 2021-02-25 (×2): 81 mg via ORAL
  Filled 2021-02-24 (×2): qty 1

## 2021-02-24 MED ORDER — ROCURONIUM BROMIDE 10 MG/ML (PF) SYRINGE
PREFILLED_SYRINGE | INTRAVENOUS | Status: AC
  Filled 2021-02-24: qty 10

## 2021-02-24 MED ORDER — FENTANYL CITRATE (PF) 250 MCG/5ML IJ SOLN
INTRAMUSCULAR | Status: DC | PRN
  Administered 2021-02-24: 50 ug via INTRAVENOUS
  Administered 2021-02-24: 100 ug via INTRAVENOUS
  Administered 2021-02-24 (×5): 50 ug via INTRAVENOUS

## 2021-02-24 MED ORDER — ACETAMINOPHEN 325 MG PO TABS
650.0000 mg | ORAL_TABLET | Freq: Four times a day (QID) | ORAL | Status: DC | PRN
  Administered 2021-02-24 – 2021-02-25 (×3): 650 mg via ORAL
  Filled 2021-02-24 (×3): qty 2

## 2021-02-24 MED ORDER — SUGAMMADEX SODIUM 500 MG/5ML IV SOLN
INTRAVENOUS | Status: DC | PRN
  Administered 2021-02-24: 500 mg via INTRAVENOUS

## 2021-02-24 MED ORDER — KETOROLAC TROMETHAMINE 30 MG/ML IJ SOLN
INTRAMUSCULAR | Status: AC
  Filled 2021-02-24: qty 1

## 2021-02-24 MED ORDER — LACTATED RINGERS IV SOLN: INTRAVENOUS | Status: DC | PRN

## 2021-02-24 MED ORDER — MULTIVITAMINS PO CAPS: 1.0000 | ORAL_CAPSULE | Freq: Every day | ORAL | Status: DC

## 2021-02-24 MED ORDER — SCOPOLAMINE 1 MG/3DAYS TD PT72
MEDICATED_PATCH | TRANSDERMAL | Status: DC | PRN
  Administered 2021-02-24: 1 via TRANSDERMAL

## 2021-02-24 MED ORDER — PROPOFOL 10 MG/ML IV BOLUS
INTRAVENOUS | Status: DC | PRN
  Administered 2021-02-24: 50 mg via INTRAVENOUS
  Administered 2021-02-24: 150 mg via INTRAVENOUS

## 2021-02-24 MED ORDER — ACETAMINOPHEN 500 MG PO TABS
1000.0000 mg | ORAL_TABLET | Freq: Once | ORAL | Status: AC
  Administered 2021-02-24: 06:00:00 1000 mg via ORAL
  Filled 2021-02-24: qty 2

## 2021-02-24 MED ORDER — DEXAMETHASONE SODIUM PHOSPHATE 10 MG/ML IJ SOLN
INTRAMUSCULAR | Status: AC
  Filled 2021-02-24: qty 1

## 2021-02-24 MED ORDER — SENNOSIDES-DOCUSATE SODIUM 8.6-50 MG PO TABS: 1.0000 | ORAL_TABLET | Freq: Every evening | ORAL | Status: DC | PRN

## 2021-02-24 MED ORDER — LISINOPRIL 10 MG PO TABS
10.0000 mg | ORAL_TABLET | Freq: Every day | ORAL | Status: DC
  Administered 2021-02-25: 10 mg via ORAL
  Filled 2021-02-24: qty 1

## 2021-02-24 MED ORDER — ORAL CARE MOUTH RINSE: 15.0000 mL | Freq: Once | OROMUCOSAL | Status: AC

## 2021-02-24 MED ORDER — ONDANSETRON HCL 4 MG/2ML IJ SOLN
INTRAMUSCULAR | Status: DC | PRN
  Administered 2021-02-24: 4 mg via INTRAVENOUS

## 2021-02-24 MED ORDER — CHLORHEXIDINE GLUCONATE 0.12 % MT SOLN
15.0000 mL | Freq: Once | OROMUCOSAL | Status: AC
  Administered 2021-02-24: 06:00:00 15 mL via OROMUCOSAL
  Filled 2021-02-24: qty 15

## 2021-02-24 MED ORDER — ONDANSETRON HCL 4 MG/2ML IJ SOLN
INTRAMUSCULAR | Status: AC
  Filled 2021-02-24: qty 2

## 2021-02-24 MED ORDER — ADULT MULTIVITAMIN W/MINERALS CH
1.0000 | ORAL_TABLET | Freq: Every day | ORAL | Status: DC
Start: 2021-02-24 — End: 2021-02-25
  Administered 2021-02-24 – 2021-02-25 (×2): 1 via ORAL
  Filled 2021-02-24 (×2): qty 1

## 2021-02-24 MED ORDER — DEXAMETHASONE SODIUM PHOSPHATE 10 MG/ML IJ SOLN
INTRAMUSCULAR | Status: DC | PRN
  Administered 2021-02-24: 5 mg via INTRAVENOUS

## 2021-02-24 MED ORDER — OXYCODONE HCL 5 MG PO TABS
ORAL_TABLET | ORAL | Status: AC
  Administered 2021-02-24: 13:00:00 5 mg via ORAL
  Filled 2021-02-24: qty 1

## 2021-02-24 MED ORDER — OXYCODONE HCL 5 MG PO TABS: 5.0000 mg | ORAL_TABLET | Freq: Once | ORAL | Status: AC | PRN

## 2021-02-24 MED ORDER — EPHEDRINE 5 MG/ML INJ
INTRAVENOUS | Status: AC
  Filled 2021-02-24: qty 5

## 2021-02-24 MED ORDER — ROCURONIUM BROMIDE 10 MG/ML (PF) SYRINGE
PREFILLED_SYRINGE | INTRAVENOUS | Status: DC | PRN
  Administered 2021-02-24: 25 mg via INTRAVENOUS
  Administered 2021-02-24 (×2): 50 mg via INTRAVENOUS
  Administered 2021-02-24 (×3): 25 mg via INTRAVENOUS
  Administered 2021-02-24: 20 mg via INTRAVENOUS
  Administered 2021-02-24: 25 mg via INTRAVENOUS

## 2021-02-24 MED ORDER — CEFAZOLIN IN SODIUM CHLORIDE 3-0.9 GM/100ML-% IV SOLN
3.0000 g | INTRAVENOUS | Status: AC
  Administered 2021-02-24: 08:00:00 3 g via INTRAVENOUS
  Filled 2021-02-24: qty 100

## 2021-02-24 MED ORDER — PHENYLEPHRINE HCL-NACL 20-0.9 MG/250ML-% IV SOLN
INTRAVENOUS | Status: DC | PRN
  Administered 2021-02-24: 60 ug/min via INTRAVENOUS

## 2021-02-24 MED ORDER — SCOPOLAMINE 1 MG/3DAYS TD PT72
MEDICATED_PATCH | TRANSDERMAL | Status: AC
  Filled 2021-02-24: qty 1

## 2021-02-24 MED ORDER — FENTANYL CITRATE (PF) 100 MCG/2ML IJ SOLN: 25.0000 ug | INTRAMUSCULAR | Status: DC | PRN

## 2021-02-24 MED ORDER — BUPIVACAINE LIPOSOME 1.3 % IJ SUSP
INTRAMUSCULAR | Status: DC | PRN
  Administered 2021-02-24: 10:00:00 50 mL

## 2021-02-24 MED ORDER — SODIUM CHLORIDE 0.9 % IV SOLN
2.0000 g | Freq: Two times a day (BID) | INTRAVENOUS | Status: AC
  Administered 2021-02-24: 18:00:00 2 g via INTRAVENOUS
  Filled 2021-02-24: qty 2

## 2021-02-24 MED ORDER — PHENYLEPHRINE 40 MCG/ML (10ML) SYRINGE FOR IV PUSH (FOR BLOOD PRESSURE SUPPORT)
PREFILLED_SYRINGE | INTRAVENOUS | Status: AC
  Filled 2021-02-24: qty 10

## 2021-02-24 MED ORDER — LIDOCAINE 2% (20 MG/ML) 5 ML SYRINGE
INTRAMUSCULAR | Status: AC
  Filled 2021-02-24: qty 5

## 2021-02-24 MED ORDER — PANTOPRAZOLE SODIUM 40 MG PO TBEC
40.0000 mg | DELAYED_RELEASE_TABLET | Freq: Every day | ORAL | Status: DC
  Administered 2021-02-25: 40 mg via ORAL
  Filled 2021-02-24: qty 1

## 2021-02-24 MED ORDER — 0.9 % SODIUM CHLORIDE (POUR BTL) OPTIME
TOPICAL | Status: DC | PRN
  Administered 2021-02-24 (×2): 1000 mL

## 2021-02-24 MED ORDER — KETOROLAC TROMETHAMINE 30 MG/ML IJ SOLN: 30.0000 mg | Freq: Four times a day (QID) | INTRAMUSCULAR | Status: DC | PRN

## 2021-02-24 MED ORDER — ONDANSETRON HCL 4 MG/2ML IJ SOLN: 4.0000 mg | Freq: Four times a day (QID) | INTRAMUSCULAR | Status: DC | PRN

## 2021-02-24 MED ORDER — ACETAMINOPHEN 650 MG RE SUPP: 650.0000 mg | Freq: Four times a day (QID) | RECTAL | Status: DC | PRN

## 2021-02-24 MED ORDER — FENTANYL CITRATE (PF) 250 MCG/5ML IJ SOLN
INTRAMUSCULAR | Status: AC
  Filled 2021-02-24: qty 5

## 2021-02-24 MED ORDER — TRAMADOL HCL 50 MG PO TABS
50.0000 mg | ORAL_TABLET | Freq: Four times a day (QID) | ORAL | Status: DC | PRN
  Administered 2021-02-25: 50 mg via ORAL
  Filled 2021-02-24: qty 1

## 2021-02-24 MED ORDER — PROMETHAZINE HCL 25 MG/ML IJ SOLN
6.2500 mg | INTRAMUSCULAR | Status: DC | PRN
Start: 2021-02-24 — End: 2021-02-24

## 2021-02-24 MED ORDER — ENOXAPARIN SODIUM 40 MG/0.4ML IJ SOSY
40.0000 mg | PREFILLED_SYRINGE | INTRAMUSCULAR | Status: DC
  Administered 2021-02-25: 40 mg via SUBCUTANEOUS
  Filled 2021-02-24: qty 0.4

## 2021-02-24 MED ORDER — PROPOFOL 10 MG/ML IV BOLUS
INTRAVENOUS | Status: AC
  Filled 2021-02-24: qty 20

## 2021-02-24 MED ORDER — KETOROLAC TROMETHAMINE 30 MG/ML IJ SOLN: 30.0000 mg | Freq: Four times a day (QID) | INTRAMUSCULAR | Status: AC

## 2021-02-24 MED ORDER — BUPIVACAINE LIPOSOME 1.3 % IJ SUSP
INTRAMUSCULAR | Status: AC
  Filled 2021-02-24: qty 20

## 2021-02-24 MED ORDER — BISACODYL 5 MG PO TBEC: 5.0000 mg | DELAYED_RELEASE_TABLET | Freq: Every day | ORAL | Status: DC | PRN

## 2021-02-24 MED ORDER — MIDAZOLAM HCL 2 MG/2ML IJ SOLN
INTRAMUSCULAR | Status: DC | PRN
  Administered 2021-02-24: 2 mg via INTRAVENOUS

## 2021-02-24 MED ORDER — BUPIVACAINE HCL (PF) 0.5 % IJ SOLN
INTRAMUSCULAR | Status: AC
  Filled 2021-02-24: qty 30

## 2021-02-24 MED ORDER — LACTATED RINGERS IV SOLN: INTRAVENOUS | Status: DC

## 2021-02-24 MED ORDER — CELECOXIB 200 MG PO CAPS
200.0000 mg | ORAL_CAPSULE | Freq: Once | ORAL | Status: AC
  Administered 2021-02-24: 06:00:00 200 mg via ORAL
  Filled 2021-02-24: qty 1

## 2021-02-24 MED ORDER — FLUOXETINE HCL 20 MG PO CAPS
40.0000 mg | ORAL_CAPSULE | Freq: Every day | ORAL | Status: DC
  Administered 2021-02-25: 40 mg via ORAL
  Filled 2021-02-24: qty 2

## 2021-02-24 MED ORDER — SUCCINYLCHOLINE CHLORIDE 200 MG/10ML IV SOSY
PREFILLED_SYRINGE | INTRAVENOUS | Status: DC | PRN
  Administered 2021-02-24: 140 mg via INTRAVENOUS

## 2021-02-24 MED ORDER — OXYCODONE HCL 5 MG/5ML PO SOLN: 5.0000 mg | Freq: Once | ORAL | Status: AC | PRN

## 2021-02-24 MED ORDER — LIDOCAINE 2% (20 MG/ML) 5 ML SYRINGE
INTRAMUSCULAR | Status: DC | PRN
  Administered 2021-02-24: 80 mg via INTRAVENOUS

## 2021-02-24 MED ORDER — ROSUVASTATIN CALCIUM 20 MG PO TABS
40.0000 mg | ORAL_TABLET | Freq: Every day | ORAL | Status: DC
  Administered 2021-02-25: 40 mg via ORAL
  Filled 2021-02-24: qty 2

## 2021-02-24 MED ORDER — ALPRAZOLAM 0.5 MG PO TABS
1.0000 mg | ORAL_TABLET | Freq: Two times a day (BID) | ORAL | Status: DC | PRN
  Administered 2021-02-24 – 2021-02-25 (×3): 1 mg via ORAL
  Filled 2021-02-24: qty 1
  Filled 2021-02-24 (×3): qty 2

## 2021-02-24 MED ORDER — FENTANYL CITRATE (PF) 100 MCG/2ML IJ SOLN
INTRAMUSCULAR | Status: AC
  Administered 2021-02-24: 12:00:00 25 ug via INTRAVENOUS
  Filled 2021-02-24: qty 2

## 2021-02-24 MED ORDER — MIDAZOLAM HCL 2 MG/2ML IJ SOLN
INTRAMUSCULAR | Status: AC
  Filled 2021-02-24: qty 2

## 2021-02-24 SURGICAL SUPPLY — 79 items
APPLICATOR CHLORAPREP 10.5 ORG (MISCELLANEOUS) ×8 IMPLANT
BLADE SURG 11 STRL SS (BLADE) ×2 IMPLANT
CANISTER SUCT 3000ML PPV (MISCELLANEOUS) ×4 IMPLANT
CATH THORACIC 28FR (CATHETERS) ×2 IMPLANT
COVER MAYO STAND STRL (DRAPES) ×2 IMPLANT
DEFOGGER SCOPE WARMER CLEARIFY (MISCELLANEOUS) ×2 IMPLANT
DERMABOND ADVANCED (GAUZE/BANDAGES/DRESSINGS) ×1
DERMABOND ADVANCED .7 DNX12 (GAUZE/BANDAGES/DRESSINGS) ×1 IMPLANT
DEVICE SECURE STRAP 25 ABSORB (INSTRUMENTS) ×2 IMPLANT
DEVICE SUTURE ENDOST 10MM (ENDOMECHANICALS) IMPLANT
DRAIN PENROSE 1/4X12 LTX STRL (WOUND CARE) ×2 IMPLANT
DRAPE ARM DVNC X/XI (DISPOSABLE) ×4 IMPLANT
DRAPE COLUMN DVNC XI (DISPOSABLE) ×1 IMPLANT
DRAPE CV SPLIT W-CLR ANES SCRN (DRAPES) ×2 IMPLANT
DRAPE DA VINCI XI ARM (DISPOSABLE) ×8
DRAPE DA VINCI XI COLUMN (DISPOSABLE) ×2
DRAPE INCISE IOBAN 66X45 STRL (DRAPES) ×4 IMPLANT
DRAPE ORTHO SPLIT 77X108 STRL (DRAPES) ×2
DRAPE SURG ORHT 6 SPLT 77X108 (DRAPES) ×1 IMPLANT
DRAPE TABLE BACK 80X90 (DRAPES) ×2 IMPLANT
ELECT REM PT RETURN 9FT ADLT (ELECTROSURGICAL) ×2
ELECTRODE REM PT RTRN 9FT ADLT (ELECTROSURGICAL) ×1 IMPLANT
FELT TEFLON 1X6 (MISCELLANEOUS) IMPLANT
GAUZE 4X4 16PLY ~~LOC~~+RFID DBL (SPONGE) ×2 IMPLANT
GAUZE KITTNER 4X8 (MISCELLANEOUS) ×2 IMPLANT
GAUZE SPONGE 4X4 12PLY STRL (GAUZE/BANDAGES/DRESSINGS) IMPLANT
GLOVE SURG ENC MOIS LTX SZ7.5 (GLOVE) ×6 IMPLANT
GLOVE SURG UNDER POLY LF SZ6.5 (GLOVE) ×2 IMPLANT
GOWN STRL REUS W/ TWL LRG LVL3 (GOWN DISPOSABLE) ×1 IMPLANT
GOWN STRL REUS W/ TWL XL LVL3 (GOWN DISPOSABLE) ×2 IMPLANT
GOWN STRL REUS W/TWL 2XL LVL3 (GOWN DISPOSABLE) ×2 IMPLANT
GOWN STRL REUS W/TWL LRG LVL3 (GOWN DISPOSABLE) ×2
GOWN STRL REUS W/TWL XL LVL3 (GOWN DISPOSABLE) ×4
GRASPER SUT TROCAR 14GX15 (MISCELLANEOUS) IMPLANT
HEMOSTAT SURGICEL 2X14 (HEMOSTASIS) IMPLANT
IV NS 1000ML (IV SOLUTION)
IV NS 1000ML BAXH (IV SOLUTION) IMPLANT
KIT BASIN OR (CUSTOM PROCEDURE TRAY) ×2 IMPLANT
KIT TURNOVER KIT B (KITS) ×2 IMPLANT
MARKER SKIN DUAL TIP RULER LAB (MISCELLANEOUS) IMPLANT
MESH VENTRALIGHT ST 4X6IN (Mesh General) ×2 IMPLANT
NEEDLE HYPO 25GX1X1/2 BEV (NEEDLE) ×2 IMPLANT
NS IRRIG 1000ML POUR BTL (IV SOLUTION) ×4 IMPLANT
OBTURATOR OPTICAL STANDARD 8MM (TROCAR) ×2
OBTURATOR OPTICAL STND 8 DVNC (TROCAR) ×1
OBTURATOR OPTICALSTD 8 DVNC (TROCAR) ×1 IMPLANT
PACK CHEST (CUSTOM PROCEDURE TRAY) ×2 IMPLANT
PAD ARMBOARD 7.5X6 YLW CONV (MISCELLANEOUS) ×4 IMPLANT
PORT ACCESS TROCAR AIRSEAL 12 (TROCAR) ×2 IMPLANT
PORT ACCESS TROCAR AIRSEAL 5M (TROCAR) ×2
SEAL CANN UNIV 5-8 DVNC XI (MISCELLANEOUS) ×4 IMPLANT
SEAL XI 5MM-8MM UNIVERSAL (MISCELLANEOUS) ×8
SEALER SYNCHRO 8 IS4000 DV (MISCELLANEOUS) ×2
SEALER SYNCHRO 8 IS4000 DVNC (MISCELLANEOUS) ×1 IMPLANT
SET IRRIG TUBING LAPAROSCOPIC (IRRIGATION / IRRIGATOR) IMPLANT
SET TRI-LUMEN FLTR TB AIRSEAL (TUBING) ×2 IMPLANT
SHEET MEDIUM DRAPE 40X70 STRL (DRAPES) ×2 IMPLANT
SPONGE T-LAP 18X18 ~~LOC~~+RFID (SPONGE) ×2 IMPLANT
STAPLER CANNULA SEAL DVNC XI (STAPLE) ×2 IMPLANT
STAPLER CANNULA SEAL XI (STAPLE) ×4
SUT ETHIBOND 0 36 GRN (SUTURE) IMPLANT
SUT SILK  1 MH (SUTURE) ×2
SUT SILK 1 MH (SUTURE) ×1 IMPLANT
SUT SURGIDAC NAB ES-9 0 48 120 (SUTURE) IMPLANT
SUT V-LOC BARB 180 2/0GR6 GS22 (SUTURE) ×10
SUT VIC AB 3-0 SH 27 (SUTURE)
SUT VIC AB 3-0 SH 27X BRD (SUTURE) IMPLANT
SUT VICRYL 0 UR6 27IN ABS (SUTURE) ×4 IMPLANT
SUTURE V-LC BRB 180 2/0GR6GS22 (SUTURE) ×5 IMPLANT
SYR 10ML LL (SYRINGE) ×6 IMPLANT
SYR 50ML LL SCALE MARK (SYRINGE) ×2 IMPLANT
SYSTEM SAHARA CHEST DRAIN ATS (WOUND CARE) ×2 IMPLANT
TOWEL GREEN STERILE (TOWEL DISPOSABLE) ×2 IMPLANT
TOWEL GREEN STERILE FF (TOWEL DISPOSABLE) ×2 IMPLANT
TRAY FOLEY MTR SLVR 16FR STAT (SET/KITS/TRAYS/PACK) ×2 IMPLANT
TROCAR PORT AIRSEAL 8X120 (TROCAR) ×2 IMPLANT
TROCAR XCEL BLADELESS 5X75MML (TROCAR) ×2 IMPLANT
TROCAR XCEL NON-BLD 5MMX100MML (ENDOMECHANICALS) IMPLANT
WATER STERILE IRR 1000ML POUR (IV SOLUTION) ×4 IMPLANT

## 2021-02-24 NOTE — Anesthesia Postprocedure Evaluation (Signed)
Anesthesia Post Note  Patient: Rhonda Colon  Procedure(s) Performed: XI ROBOTIC ASSISTED REPAIR OF DIAPHRAGMATIC HERNIA WITH MESH (Chest)     Patient location during evaluation: PACU Anesthesia Type: General Level of consciousness: awake and alert Pain management: pain level controlled Vital Signs Assessment: post-procedure vital signs reviewed and stable Respiratory status: spontaneous breathing, respiratory function stable, patient connected to nasal cannula oxygen and nonlabored ventilation Cardiovascular status: blood pressure returned to baseline and stable Postop Assessment: no apparent nausea or vomiting Anesthetic complications: no   No notable events documented.  Last Vitals:  Vitals:   02/24/21 0559  BP: (!) 162/83  Pulse: 95  Resp: 18  Temp: 37.1 C  SpO2: 96%    Last Pain:  Vitals:   02/24/21 0608  TempSrc:   PainSc: 0-No pain                 Beryle Lathe

## 2021-02-24 NOTE — Transfer of Care (Signed)
Immediate Anesthesia Transfer of Care Note  Patient: Rhonda Colon  Procedure(s) Performed: XI ROBOTIC ASSISTED REPAIR OF DIAPHRAGMATIC HERNIA WITH MESH (Chest)  Patient Location: PACU  Anesthesia Type:General  Level of Consciousness: awake, alert  and oriented  Airway & Oxygen Therapy: Patient Spontanous Breathing and Patient connected to face mask oxygen  Post-op Assessment: Report given to RN, Post -op Vital signs reviewed and stable and Patient moving all extremities X 4  Post vital signs: Reviewed and stable  Last Vitals:  Vitals Value Taken Time  BP 139/82 02/24/21 1121  Temp    Pulse 96 02/24/21 1123  Resp 22 02/24/21 1123  SpO2 94 % 02/24/21 1123  Vitals shown include unvalidated device data.  Last Pain:  Vitals:   02/24/21 0608  TempSrc:   PainSc: 0-No pain         Complications: No notable events documented.

## 2021-02-24 NOTE — Hospital Course (Signed)
History of Present Illness:   At time of surgical consultation Rhonda Colon 45 y.o. female presents in follow-up to discuss repair of a Morgagni's hernia.  There is not been any significant changes since her last appointment.  She was scheduled to follow-up again August but her mother was hospitalized which postponed her personal needs.  She remains short of breath with exertion.  She denies any abdominal pain.  She continues to have some reflux symptoms.  Dr. Cliffton Asters evaluated the patient and all relevant studies and recommended proceeding with robotic repair.  Hospital course patient was admitted electively on 02/24/2021 and taken the operating room at which time she underwent the following procedure: Robotic diaphragmatic hernia repair with mesh.  She tolerated procedure well was taken to the postanesthesia care unit in stable condition

## 2021-02-24 NOTE — Brief Op Note (Signed)
02/24/2021  11:04 AM  PATIENT:  Rhonda Colon  45 y.o. female  PRE-OPERATIVE DIAGNOSIS:  DIAPHRAGMATIC HERNIA  POST-OPERATIVE DIAGNOSIS:  DIAPHRAGMATIC HERNIA  PROCEDURE:  Procedure(s): XI ROBOTIC ASSISTED REPAIR OF DIAPHRAGMATIC HERNIA WITH MESH (N/A)  SURGEON:  Surgeon(s) and Role:    * Lightfoot, Eliezer Lofts, MD - Primary  PHYSICIAN ASSISTANT: Hobert Poplaski PA-C  ASSISTANTS: none   ANESTHESIA:   general  EBL:  50CC  BLOOD ADMINISTERED:none  DRAINS: none   LOCAL MEDICATIONS USED:   EXPAREL  SPECIMEN:  No Specimen  DISPOSITION OF SPECIMEN:  N/A  COUNTS:  YES  TOURNIQUET:  * No tourniquets in log *  DICTATION: .Dragon Dictation  PLAN OF CARE: ADMIT FOR OBSERVATION  PATIENT DISPOSITION:  PACU - hemodynamically stable.   Delay start of Pharmacological VTE agent (>24hrs) due to surgical blood loss or risk of bleeding: no  COMPLICATIONS: NO KNOWN

## 2021-02-24 NOTE — Addendum Note (Signed)
Addendum  created 02/24/21 1325 by Nils Pyle, CRNA   Charge Capture section accepted, Visit diagnoses modified

## 2021-02-24 NOTE — Interval H&P Note (Signed)
History and Physical Interval Note:  02/24/2021 6:53 AM  Rhonda Colon  has presented today for surgery, with the diagnosis of DIAPHRAGMATIC HERNIA.  The various methods of treatment have been discussed with the patient and family. After consideration of risks, benefits and other options for treatment, the patient has consented to  Procedure(s): XI ROBOTIC ASSISTED REPAIR OF DIAPHRAGMATIC HERNIA (N/A) as a surgical intervention.  The patient's history has been reviewed, patient examined, no change in status, stable for surgery.  I have reviewed the patient's chart and labs.  Questions were answered to the patient's satisfaction.     Ladaija Dimino Keane Scrape

## 2021-02-24 NOTE — Plan of Care (Signed)

## 2021-02-24 NOTE — Anesthesia Procedure Notes (Addendum)
Procedure Name: Intubation Date/Time: 02/24/2021 7:42 AM Performed by: Nils Pyle, CRNA Pre-anesthesia Checklist: Patient identified, Emergency Drugs available, Suction available and Patient being monitored Patient Re-evaluated:Patient Re-evaluated prior to induction Oxygen Delivery Method: Circle System Utilized Preoxygenation: Pre-oxygenation with 100% oxygen Induction Type: IV induction Ventilation: Oral airway inserted - appropriate to patient size and Two handed mask ventilation required Laryngoscope Size: Glidescope and 4 Grade View: Grade I Tube type: Oral Tube size: 7.5 mm Number of attempts: 1 Airway Equipment and Method: Stylet and Oral airway Placement Confirmation: ETT inserted through vocal cords under direct vision, positive ETCO2 and breath sounds checked- equal and bilateral Secured at: 22 cm Tube secured with: Tape Dental Injury: Teeth and Oropharynx as per pre-operative assessment  Comments: Intubation by Conley Simmonds

## 2021-02-24 NOTE — Op Note (Signed)
[address]       Rhonda Colon [id number]             [phone]        02/24/2021  Patient:  Rhonda Colon Pre-Op Dx: Rhonda Colon hernia   Morbid obesity Post-op Dx: Same Procedure:  - Robotic assisted laparoscopy -Repair of Morgagni hernia with 4 x 6 inch Bard Ventralight mesh   Surgeon and Role:      * Rhonda Colon, Rhonda Lofts, MD - Primary  Assistant: Rhonda Laws, PA-C  An experienced assistant was required given the complexity of this surgery and the standard of surgical care. The assistant was needed for exposure, dissection, suctioning, retraction of delicate tissues and sutures, instrument exchange and for overall help during this procedure.   Anesthesia  general EBL: 50 ml Blood Administration: None Specimen: None   Counts: correct   Indications: 45 year old female with large Morgagni's hernia.  We will review her cross-sectional imaging but mostly contains fat.  She does not have any recent imaging but her symptoms have not much changed.  She has lost approximately 30 pounds since her last visit.  We have discussed risks and benefits of a robotic assisted diaphragmatic hernia repair.  She is agreeable to proceed.  Given her body habitus and weight this will be repaired with mesh given her high risk of recurrence.  Findings: Large diaphragmatic defect.  Omentum was within the hernia sac.  Operative Technique: After the risks, benefits and alternatives were thoroughly discussed, the patient was brought to the operative theatre.  Anesthesia was induced, and the patient was then prepped and draped in normal sterile fashion.  An appropriate surgical pause was performed, and pre-operative antibiotics were dosed accordingly.  We began with a 1 cm incision 15 cm caudad from the xiphoid and slightly lateral to the umbilicus.  Using an Optiview we entered the peritoneal space.  The abdomen was then insufflated with CO2.  3 other robotic ports were placed to  triangulate the hiatus.  Another 12 mm port was placed in place at the level of the umbilicus laterally for an assistant port.  The patient was then placed in steep reverse Trendelenburg and the robot was docked.  The diaphragmatic defect was evident along the right hemidiaphragm.  We attempted to reduce the hernia content but it was deeply invested.  We then began to mobilize the hernia sac along his anterior lip and removed laterally.  The hernia sac was dissected off of the muscle edge circumferentially.  Once we had adequate mobilization of the hernia sac the content was easily reducible.  Mostly contained omentum.  There was no bowel within the hernia sac.  For further mobilization of the diaphragm the falciform ligament was divided.  There was good laxity of the diaphragm however given the patient's body habitus I elected to use a bard mesh to cover the defect.  The mesh was initially tacked to the lower edge of the diaphragm, and then it was oversewn with a V-Loc suture to cover any defects.  There mesh was then sewn to the undersurface of the sternum and abdominal wall using V-Loc suture as well.  Once the hernia was completely covered all instruments were then removed and the robot was undocked.  I then placed additional tacks along the abdominal wall to fixate the mesh to it.  All ports were removed under direct visualization.  The skin and soft tissue were closed with absorbable suture  The patient tolerated the procedure without any immediate complications, and was transferred to the PACU in stable condition.  Rhonda Colon Rhonda Colon

## 2021-02-25 ENCOUNTER — Observation Stay (HOSPITAL_COMMUNITY): Payer: 59

## 2021-02-25 DIAGNOSIS — Z6841 Body Mass Index (BMI) 40.0 and over, adult: Secondary | ICD-10-CM | POA: Diagnosis not present

## 2021-02-25 DIAGNOSIS — E785 Hyperlipidemia, unspecified: Secondary | ICD-10-CM | POA: Diagnosis not present

## 2021-02-25 DIAGNOSIS — Z87891 Personal history of nicotine dependence: Secondary | ICD-10-CM | POA: Diagnosis not present

## 2021-02-25 DIAGNOSIS — Z7982 Long term (current) use of aspirin: Secondary | ICD-10-CM | POA: Diagnosis not present

## 2021-02-25 DIAGNOSIS — Z801 Family history of malignant neoplasm of trachea, bronchus and lung: Secondary | ICD-10-CM | POA: Diagnosis not present

## 2021-02-25 DIAGNOSIS — K449 Diaphragmatic hernia without obstruction or gangrene: Secondary | ICD-10-CM | POA: Diagnosis not present

## 2021-02-25 DIAGNOSIS — Z83438 Family history of other disorder of lipoprotein metabolism and other lipidemia: Secondary | ICD-10-CM | POA: Diagnosis not present

## 2021-02-25 DIAGNOSIS — Z803 Family history of malignant neoplasm of breast: Secondary | ICD-10-CM | POA: Diagnosis not present

## 2021-02-25 DIAGNOSIS — Z8249 Family history of ischemic heart disease and other diseases of the circulatory system: Secondary | ICD-10-CM | POA: Diagnosis not present

## 2021-02-25 DIAGNOSIS — I2510 Atherosclerotic heart disease of native coronary artery without angina pectoris: Secondary | ICD-10-CM | POA: Diagnosis not present

## 2021-02-25 DIAGNOSIS — I252 Old myocardial infarction: Secondary | ICD-10-CM | POA: Diagnosis not present

## 2021-02-25 DIAGNOSIS — Z79899 Other long term (current) drug therapy: Secondary | ICD-10-CM | POA: Diagnosis not present

## 2021-02-25 DIAGNOSIS — Z818 Family history of other mental and behavioral disorders: Secondary | ICD-10-CM | POA: Diagnosis not present

## 2021-02-25 DIAGNOSIS — Z800 Family history of malignant neoplasm of digestive organs: Secondary | ICD-10-CM | POA: Diagnosis not present

## 2021-02-25 LAB — CBC
HCT: 42.6 % (ref 36.0–46.0)
Hemoglobin: 14 g/dL (ref 12.0–15.0)
MCH: 30.6 pg (ref 26.0–34.0)
MCHC: 32.9 g/dL (ref 30.0–36.0)
MCV: 93.2 fL (ref 80.0–100.0)
Platelets: 318 10*3/uL (ref 150–400)
RBC: 4.57 MIL/uL (ref 3.87–5.11)
RDW: 13 % (ref 11.5–15.5)
WBC: 11.9 10*3/uL — ABNORMAL HIGH (ref 4.0–10.5)
nRBC: 0 % (ref 0.0–0.2)

## 2021-02-25 LAB — BASIC METABOLIC PANEL
Anion gap: 9 (ref 5–15)
BUN: 7 mg/dL (ref 6–20)
CO2: 27 mmol/L (ref 22–32)
Calcium: 8.8 mg/dL — ABNORMAL LOW (ref 8.9–10.3)
Chloride: 102 mmol/L (ref 98–111)
Creatinine, Ser: 0.84 mg/dL (ref 0.44–1.00)
GFR, Estimated: >60 mL/min (ref >60)
Glucose, Bld: 120 mg/dL — ABNORMAL HIGH (ref 70–99)
Potassium: 3.8 mmol/L (ref 3.5–5.1)
Sodium: 138 mmol/L (ref 135–145)

## 2021-02-25 MED ORDER — ACETAMINOPHEN 325 MG PO TABS: 650.0000 mg | ORAL_TABLET | Freq: Four times a day (QID) | ORAL | Status: DC | PRN

## 2021-02-25 MED ORDER — OXYCODONE HCL 5 MG PO TABS: 5.0000 mg | ORAL_TABLET | Freq: Four times a day (QID) | ORAL | 0 refills | Status: AC | PRN

## 2021-02-25 NOTE — Progress Notes (Signed)
      [address]       [place] [address]             [phone]      1 Day Post-Op Procedure(s) (LRB): XI ROBOTIC ASSISTED REPAIR OF DIAPHRAGMATIC HERNIA WITH MESH (N/A) Subjective: Feels ok, some minor discomforts, tylenol is working [employer]  Objective: Vital signs in last 24 hours: Temp:  [97 F (36.1 C)-98.6 F (37 C)] 98 F (36.7 C) (11/29 0416) Pulse Rate:  [79-104] 85 (11/29 0416) Cardiac Rhythm: Normal sinus rhythm (11/29 0416) Resp:  [11-31] 17 (11/29 0416) BP: (102-155)/(73-95) 143/77 (11/29 0416) SpO2:  [92 %-100 %] 95 % (11/29 0416)  Hemodynamic parameters for last 24 hours:    Intake/Output from previous day: 11/28 0701 - 11/29 0700 In: 2360 [P.O.:360; I.V.:2000] Out: 150 [Urine:120; Blood:30] Intake/Output this shift: No intake/output data recorded.  General appearance: alert, cooperative, and no distress Heart: regular rate and rhythm Lungs: dim right base, o/w clear and moving air well Abdomen: minor incisional tenderness Extremities: no edema Wound: incis healing well  Lab Results: Recent Labs    02/24/21 1645 02/25/21 0436  WBC 12.4* 11.9*  HGB 14.5 14.0  HCT 42.8 42.6  PLT 304 318   BMET:  Recent Labs    02/24/21 1645 02/25/21 0436  NA  --  138  K  --  3.8  CL  --  102  CO2  --  27  GLUCOSE  --  120*  BUN  --  7  CREATININE 0.74 0.84  CALCIUM  --  8.8*    PT/INR: No results for input(s): LABPROT, INR in the last 72 hours. ABG No results found for: PHART, HCO3, TCO2, ACIDBASEDEF, O2SAT CBG (last 3)  No results for input(s): GLUCAP in the last 72 hours.  Meds Scheduled Meds:  aspirin EC  81 mg Oral Daily   enoxaparin (LOVENOX) injection  40 mg Subcutaneous Q24H   FLUoxetine  40 mg Oral Daily   lisinopril  10 mg Oral Daily   multivitamin with minerals  1 tablet Oral Daily   pantoprazole  40 mg Oral Daily   rosuvastatin  40 mg Oral Daily   Continuous Infusions: PRN Meds:.acetaminophen **OR**  acetaminophen, ALPRAZolam, bisacodyl, [EXPIRED] ketorolac **FOLLOWED BY** ketorolac, ondansetron **OR** ondansetron (ZOFRAN) IV, senna-docusate, traMADol  Xrays No results found.  Assessment/Plan: S/P Procedure(s) (LRB): XI ROBOTIC ASSISTED REPAIR OF DIAPHRAGMATIC HERNIA WITH MESH (N/A)  1 afeb, VSS s BP 101-155- back on lisinopril today, she had held 2 sats good on 2 liters- wean/pulm hygiene, replaced for sats in 80's overnight- will check PCXR  3 UOP not accurate- voiding without problem, normal renal fxn 4 minor leukocytosis, improved trend 5 Not anemic 6 ambulating fairly well 7 no problems with eating 8 monitor this am and wean O2 , if no problems, home later today   LOS: 1 day    Rowe Clack PA-C Pager [phone] 02/25/2021

## 2021-02-25 NOTE — Discharge Summary (Signed)
Physician Discharge Summary  Patient ID: Rhonda Colon MRN: [id number] DOB/AGE: 30-May-1975 45 y.o.  Admit date: 02/24/2021 Discharge date: 02/25/2021  Admission Diagnoses: Diaphragmatic hernia  Discharge Diagnoses:  Principal Problem:   Diaphragmatic hernia  Patient Active Problem List   Diagnosis Date Noted   Diaphragmatic hernia 02/24/2021   Vulvovaginitis 01/31/2021   Dyslipidemia 06/16/2020   Other fatigue 08/31/2017   Shortness of breath on exertion 08/31/2017   Essential hypertension 08/31/2017   Depression 12/10/2015   Urticaria, solar 08/08/2013   CAD (coronary artery disease) 08/28/2011   Chronic low back pain 07/27/2011    Discharged Condition: good History of Present Illness:   At time of surgical consultation Rhonda Colon 45 y.o. female presents in follow-up to discuss repair of a Morgagni's hernia.  There is not been any significant changes since her last appointment.  She was scheduled to follow-up again August but her mother was hospitalized which postponed her personal needs.  She remains short of breath with exertion.  She denies any abdominal pain.  She continues to have some reflux symptoms.  Dr. Cliffton Asters evaluated the patient and all relevant studies and recommended proceeding with robotic repair.  Hospital course patient was admitted electively on 02/24/2021 and taken the operating room at which time she underwent the following procedure: Robotic diaphragmatic hernia repair with mesh.  She tolerated procedure well was taken to the postanesthesia care unit in stable condition  Postoperative hospital course:  Patient has done well.  Pain has been adequately controlled using standard medications and primarily has used Tylenol.  Her diet has been advanced without difficulty.  She is tolerating gradually increasing activity using standard post procedure protocols and ambulating well in the halls.  Incisions are healing well without evidence of infection.   Laboratory values are stable with normal renal function and she is noted not to be anemic.  She did require oxygen overnight but this has been weaned and she is having good oxygen saturations on room air.  At the time of discharge the patient is felt to be quite stable.  Consults: None  Significant Diagnostic Studies:  DG Chest 1 View  Result Date: 02/25/2021 CLINICAL DATA:  45 year old female status post repair of diaphragmatic hernia. Postoperative day 1. Increase shortness of breath. EXAM: CHEST  1 VIEW COMPARISON:  Preoperative chest radiographs 02/21/2021. Chest CT 07/30/2020 and earlier. FINDINGS: Portable AP upright view at 0737 hours. There is similar veiling opacity in the right lower lung to the preoperative chest radiographs. Other mediastinal contours are within normal limits. Visualized tracheal air column is within normal limits. Elsewhere Allowing for portable technique the lungs are clear. No pneumothorax. Paucity of bowel gas in the upper abdomen. No acute osseous abnormality identified. IMPRESSION: Unresolved veiling opacity at the right lung base, similar to that on preoperative chest radiographs. No new cardiopulmonary abnormality. Electronically Signed   By: Odessa Fleming M.D.   On: 02/25/2021 07:55   DG Chest 2 View  Result Date: 02/21/2021 CLINICAL DATA:  Preop for hernia repair. EXAM: CHEST - 2 VIEW COMPARISON:  Jul 30, 2020.  April 18, 2020. FINDINGS: The heart size and mediastinal contours are within normal limits. Stable large density is noted anteriorly in right lung base consistent with fat containing Morgagni hernia described on prior CT scan. Left lung is clear. There is continued atelectasis of the right middle lobe secondary to the large hernia. The visualized skeletal structures are unremarkable. IMPRESSION: Stable large fat containing Morgagni hernia is noted in the  right hemithorax anteriorly which results in atelectasis of the right middle lobe. Electronically Signed    By: Lupita Raider M.D.   On: 02/21/2021 11:35    Treatments: surgery:    02/24/2021   Patient:  Rhonda Colon Pre-Op Dx: Rhonda Colon hernia                         Morbid obesity Post-op Dx: Same Procedure:   - Robotic assisted laparoscopy -Repair of Morgagni hernia with 4 x 6 inch Bard Ventralight mesh     Surgeon and Role:      * Lightfoot, Eliezer Lofts, MD - Primary   Discharge Exam: Blood pressure 134/83, pulse 92, temperature 98 F (36.7 C), temperature source Oral, resp. rate 20, height 5' 4.5" (1.638 m), weight (!) 144.7 kg, last menstrual period 10/02/2020, SpO2 95 %.  General appearance: alert, cooperative, and no distress Heart: regular rate and rhythm Lungs: dim right base, o/w clear and moving air well Abdomen: minor incisional tenderness Extremities: no edema Wound: incis healing well   Disposition: Discharge disposition: 01-Home or Self Care       Discharge Instructions     Discharge patient   Complete by: As directed    After O2 has been weaned off   Discharge disposition: 01-Home or Self Care   Discharge patient date: 02/25/2021      Allergies as of 02/25/2021   No Known Allergies      Medication List     TAKE these medications    acetaminophen 325 MG tablet Commonly known as: Tylenol Take 2 tablets (650 mg total) by mouth every 6 (six) hours as needed for mild pain.   ALPRAZolam 1 MG tablet Commonly known as: XANAX Take 1 tablet (1 mg total) by mouth 2 (two) times daily as needed for anxiety.   aspirin 81 MG tablet Take 81 mg by mouth daily.   FLUoxetine 40 MG capsule Commonly known as: PROZAC Take 1 capsule by mouth once daily   lisinopril 10 MG tablet Commonly known as: ZESTRIL Take 1 tablet (10 mg total) by mouth daily.   multivitamin capsule Take 1 capsule by mouth daily.   nitroGLYCERIN 0.4 MG SL tablet Commonly known as: NITROSTAT Place 1 tablet (0.4 mg total) under the tongue every 5 (five) minutes as needed for  chest pain.   nystatin cream Commonly known as: MYCOSTATIN APPLY 1 APPLICATION OF CREAM TOPICALLY TO AFFECTED AREA TWICE DAILY FOR  UP  TO  7  DAYS   omeprazole 20 MG capsule Commonly known as: PRILOSEC Take 1 capsule by mouth once daily   oxyCODONE 5 MG immediate release tablet Commonly known as: Oxy IR/ROXICODONE Take 1 tablet (5 mg total) by mouth every 6 (six) hours as needed for up to 5 days for severe pain.   oxyCODONE 5 MG immediate release tablet Commonly known as: Oxy IR/ROXICODONE Take 1 tablet (5 mg total) by mouth every 6 (six) hours as needed for up to 5 days for severe pain.   rosuvastatin 40 MG tablet Commonly known as: CRESTOR Take 1 tablet (40 mg total) by mouth daily.        Follow-up Information     Lightfoot, Eliezer Lofts, MD Follow up in 1 week(s).   Specialty: Cardiothoracic Surgery Why: Please see discharge paperwork for follow-up appointment with Dr. Cliffton Asters.  It will be a virtual visit and he will call you.  Do not come to the office.  Subsequent  appointments will be in person. Contact information: [address] [place] [address] [phone]                 Signed: Rowe Clack PA-C 02/25/2021, 1:59 PM

## 2021-02-25 NOTE — Progress Notes (Signed)
Patient provided with verbal discharge instructions. Paper copy of discharge provided to patient. RN answered all questions. VSS at discharge. IV's removed. Patient belongings sent with patient. Patient dc'd by NT via wheelchair through [employer] entrance to private vehicle

## 2021-03-07 ENCOUNTER — Other Ambulatory Visit: Payer: Self-pay

## 2021-03-07 ENCOUNTER — Ambulatory Visit (INDEPENDENT_AMBULATORY_CARE_PROVIDER_SITE_OTHER): Payer: Self-pay | Admitting: Thoracic Surgery (Cardiothoracic Vascular Surgery)

## 2021-03-07 DIAGNOSIS — Q790 Congenital diaphragmatic hernia: Secondary | ICD-10-CM

## 2021-03-07 NOTE — Progress Notes (Signed)
     [address]       [place] [address]             [phone]       Patient: Home Provider: Office Consent for Telemedicine visit obtained.  Today's visit was completed via a real-time telehealth (see specific modality noted below). The patient/authorized person provided oral consent at the time of the visit to engage in a telemedicine encounter with the present provider at [facility]. The patient/authorized person was informed of the potential benefits, limitations, and risks of telemedicine. The patient/authorized person expressed understanding that the laws that protect confidentiality also apply to telemedicine. The patient/authorized person acknowledged understanding that telemedicine does not provide emergency services and that he or she would need to call 911 or proceed to the nearest hospital for help if such a need arose.   Total time spent in the clinical discussion 10 minutes.  Telehealth Modality: Phone visit (audio only)  I had a telephone visit with Rhonda Colon.  She is status post robotic assisted morganii hernia repair.  She states that the pain is minimal.  She does still have some bloating.  She has not noticed much of a difference in her respiratory status except that her oxygenation has dramatically improved.  I will see her back in 1 month with a chest x-ray.

## 2021-03-09 ENCOUNTER — Other Ambulatory Visit: Payer: Self-pay | Admitting: Internal Medicine

## 2021-03-09 ENCOUNTER — Other Ambulatory Visit: Payer: Self-pay | Admitting: Cardiology

## 2021-03-10 ENCOUNTER — Other Ambulatory Visit: Payer: Self-pay

## 2021-03-10 MED ORDER — ALPRAZOLAM 1 MG PO TABS
1.0000 mg | ORAL_TABLET | Freq: Two times a day (BID) | ORAL | 2 refills | Status: DC | PRN
Start: 2021-03-10 — End: 2021-06-05

## 2021-03-10 MED ORDER — FLUOXETINE HCL 40 MG PO CAPS: 40.0000 mg | ORAL_CAPSULE | Freq: Every day | ORAL | 1 refills | Status: DC

## 2021-03-10 NOTE — Telephone Encounter (Signed)
Thought she had refills but they expired in December. Please refill.

## 2021-03-20 ENCOUNTER — Other Ambulatory Visit: Payer: Self-pay | Admitting: Thoracic Surgery (Cardiothoracic Vascular Surgery)

## 2021-03-20 DIAGNOSIS — I25119 Atherosclerotic heart disease of native coronary artery with unspecified angina pectoris: Secondary | ICD-10-CM

## 2021-04-04 ENCOUNTER — Ambulatory Visit (INDEPENDENT_AMBULATORY_CARE_PROVIDER_SITE_OTHER): Payer: Self-pay | Admitting: Thoracic Surgery (Cardiothoracic Vascular Surgery)

## 2021-04-04 ENCOUNTER — Other Ambulatory Visit: Payer: Self-pay

## 2021-04-04 ENCOUNTER — Ambulatory Visit
Admission: RE | Admit: 2021-04-04 | Discharge: 2021-04-04 | Disposition: A | Discharge status: Home / Self Care | Payer: 59 | Source: Ambulatory Visit | Attending: Thoracic Surgery (Cardiothoracic Vascular Surgery) | Admitting: Thoracic Surgery (Cardiothoracic Vascular Surgery)

## 2021-04-04 VITALS — BP 138/86 | HR 90 | Resp 20 | Ht 64.5 in | Wt 314.0 lb

## 2021-04-04 DIAGNOSIS — I2510 Atherosclerotic heart disease of native coronary artery without angina pectoris: Secondary | ICD-10-CM | POA: Diagnosis not present

## 2021-04-04 DIAGNOSIS — M4854XA Collapsed vertebra, not elsewhere classified, thoracic region, initial encounter for fracture: Secondary | ICD-10-CM | POA: Diagnosis not present

## 2021-04-04 DIAGNOSIS — I25119 Atherosclerotic heart disease of native coronary artery with unspecified angina pectoris: Secondary | ICD-10-CM

## 2021-04-04 DIAGNOSIS — Q790 Congenital diaphragmatic hernia: Secondary | ICD-10-CM

## 2021-04-04 DIAGNOSIS — Z09 Encounter for follow-up examination after completed treatment for conditions other than malignant neoplasm: Secondary | ICD-10-CM

## 2021-04-04 DIAGNOSIS — M40204 Unspecified kyphosis, thoracic region: Secondary | ICD-10-CM | POA: Diagnosis not present

## 2021-04-04 DIAGNOSIS — K449 Diaphragmatic hernia without obstruction or gangrene: Secondary | ICD-10-CM | POA: Diagnosis not present

## 2021-04-04 NOTE — Progress Notes (Signed)
° °   °  [address]       [place] [address]             [phone]        Rhonda Colon [facility] Medical Record #[id number] Date of Birth: September 06, 1975  Referring: Rollene Rotunda, MD Primary Care: Margaree Mackintosh, MD Primary Cardiologist:James Antoine Poche, MD  Reason for visit:   follow-up  History of Present Illness:     Rhonda Colon presents for her 1 month follow-up appointment.  She states that she has noticed some increased pain in her epigastrium.  This all occurred about 2 weeks ago.  She also has had some indigestion as well as some blood per rectum.  It has resolved.  Physical Exam: BP 138/86 (BP Location: Right Arm, Patient Position: Sitting)    Pulse 90    Resp 20    Ht 5' 4.5" (1.638 m)    Wt (!) 314 lb (142.4 kg)    SpO2 94% Comment: RA   BMI 53.07 kg/m   Alert NAD Incision well-healed.   Abdomen ND No peripheral edema   Diagnostic Studies & Laboratory data: CXR: Recurrence of her diaphragmatic hernia     Assessment / Plan:   46 year old female status post robotic assisted diaphragmatic hernia with parent.  Based off of her symptoms and appearance of the chest x-ray, her hernia has recurred.  We did use mesh for this repair but likely due to her body habitus and BMI the hernia has recurred.  I have ordered a CT scan for further evaluation.  I will bring her back to discuss results of the CT scan.  I think with some of the most importance is her losing weight prior to attempting a revision of this.   Corliss Skains 04/04/2021 4:22 PM

## 2021-04-09 ENCOUNTER — Other Ambulatory Visit: Payer: Self-pay

## 2021-04-09 ENCOUNTER — Ambulatory Visit (INDEPENDENT_AMBULATORY_CARE_PROVIDER_SITE_OTHER): Payer: Self-pay | Admitting: Thoracic Surgery (Cardiothoracic Vascular Surgery)

## 2021-04-09 DIAGNOSIS — Q790 Congenital diaphragmatic hernia: Secondary | ICD-10-CM

## 2021-04-11 ENCOUNTER — Ambulatory Visit
Admission: RE | Admit: 2021-04-11 | Discharge: 2021-04-11 | Disposition: A | Discharge status: Home / Self Care | Payer: 59 | Source: Ambulatory Visit | Attending: Thoracic Surgery (Cardiothoracic Vascular Surgery) | Admitting: Thoracic Surgery (Cardiothoracic Vascular Surgery)

## 2021-04-11 DIAGNOSIS — Q790 Congenital diaphragmatic hernia: Secondary | ICD-10-CM

## 2021-04-11 DIAGNOSIS — I700 Atherosclerosis of aorta: Secondary | ICD-10-CM | POA: Diagnosis not present

## 2021-04-12 ENCOUNTER — Encounter: Payer: Self-pay | Admitting: Cardiology

## 2021-04-14 ENCOUNTER — Ambulatory Visit (INDEPENDENT_AMBULATORY_CARE_PROVIDER_SITE_OTHER): Payer: Self-pay | Admitting: Thoracic Surgery (Cardiothoracic Vascular Surgery)

## 2021-04-14 ENCOUNTER — Other Ambulatory Visit: Payer: Self-pay

## 2021-04-14 DIAGNOSIS — Q790 Congenital diaphragmatic hernia: Secondary | ICD-10-CM

## 2021-04-14 NOTE — Progress Notes (Signed)
°   °  [address]       [place] [address]             [phone]       Patient: Home Provider: Office Consent for Telemedicine visit obtained.  Todays visit was completed via a real-time telehealth (see specific modality noted below). The patient/authorized person provided oral consent at the time of the visit to engage in a telemedicine encounter with the present provider at [facility]. The patient/authorized person was informed of the potential benefits, limitations, and risks of telemedicine. The patient/authorized person expressed understanding that the laws that protect confidentiality also apply to telemedicine. The patient/authorized person acknowledged understanding that telemedicine does not provide emergency services and that he or she would need to call 911 or proceed to the nearest hospital for help if such a need arose.   Total time spent in the clinical discussion 10 minutes.  Telehealth Modality: Phone visit (audio only)  I had a telephone visit with Rhonda Colon.  We discussed the results of her CT scan.  She has not had a recurrence of her diaphragmatic hernia.  She does have a large fluid collection which is likely seroma that is filled the hernia space.  I have told her that we will plan to obtain a CT-guided drain placement, and we will admit her postoperatively for drain care teaching.  I stressed the importance of keeping this area clean and that it is in continuity with the mesh.  We will plan to have this procedure performed in the next week.

## 2021-04-18 ENCOUNTER — Encounter: Payer: Self-pay | Admitting: *Deleted

## 2021-04-22 ENCOUNTER — Encounter (HOSPITAL_COMMUNITY): Payer: Self-pay | Admitting: Radiology

## 2021-04-22 NOTE — Progress Notes (Signed)
Patient Name  [name], [name] Legal Sex  Female DOB  May 08, 1975 SSN  [id number] Address  [address]  [place] [address] Phone  [phone] (Home)  [phone] (Mobile) *Preferred*    RE: CT IMAGE GUIDED DRAINAGE BY PERCUTANEOUS CATHETER Received: 3 days ago Irish Lack, MD  Henry Russel D OK for CT guided drainage of right subpulmonic fluid collection   GY        Previous Messages   ----- Message -----  From: Henry Russel D  Sent: 04/18/2021   5:48 PM EST  To: Ir Procedure Requests  Subject: CT IMAGE GUIDED DRAINAGE BY PERCUTANEOUS CAT*   Procedure:  CT IMAGE GUIDED DRAINAGE BY PERCUTANEOUS CATHETER   Reason:  Morgagni hernia, right thoracic effusion following diaphragmatic hernia repair. will require image guided drain   History:  CT in computer   Provider:  Corliss Skains   Provider Contact:  [phone]

## 2021-04-24 ENCOUNTER — Other Ambulatory Visit: Payer: Self-pay | Admitting: Cardiology

## 2021-04-29 ENCOUNTER — Other Ambulatory Visit (HOSPITAL_COMMUNITY): Payer: Self-pay | Admitting: Physician Assistant

## 2021-04-30 ENCOUNTER — Other Ambulatory Visit (HOSPITAL_COMMUNITY): Payer: Self-pay

## 2021-04-30 ENCOUNTER — Ambulatory Visit (HOSPITAL_COMMUNITY)
Admission: RE | Admit: 2021-04-30 | Discharge: 2021-04-30 | Disposition: A | Discharge status: Home / Self Care | Payer: 59 | Source: Ambulatory Visit | Attending: Thoracic Surgery (Cardiothoracic Vascular Surgery) | Admitting: Thoracic Surgery (Cardiothoracic Vascular Surgery)

## 2021-04-30 ENCOUNTER — Other Ambulatory Visit: Payer: Self-pay | Admitting: Thoracic Surgery (Cardiothoracic Vascular Surgery)

## 2021-04-30 ENCOUNTER — Other Ambulatory Visit: Payer: Self-pay | Admitting: Physician Assistant

## 2021-04-30 ENCOUNTER — Encounter (HOSPITAL_COMMUNITY): Payer: Self-pay

## 2021-04-30 ENCOUNTER — Other Ambulatory Visit: Payer: Self-pay

## 2021-04-30 DIAGNOSIS — Q790 Congenital diaphragmatic hernia: Secondary | ICD-10-CM | POA: Insufficient documentation

## 2021-04-30 DIAGNOSIS — T8143XA Infection following a procedure, organ and space surgical site, initial encounter: Secondary | ICD-10-CM | POA: Diagnosis not present

## 2021-04-30 DIAGNOSIS — T888XXA Other specified complications of surgical and medical care, not elsewhere classified, initial encounter: Secondary | ICD-10-CM

## 2021-04-30 DIAGNOSIS — Z8619 Personal history of other infectious and parasitic diseases: Secondary | ICD-10-CM

## 2021-04-30 DIAGNOSIS — J90 Pleural effusion, not elsewhere classified: Secondary | ICD-10-CM | POA: Insufficient documentation

## 2021-04-30 HISTORY — DX: Personal history of other infectious and parasitic diseases: Z86.19

## 2021-04-30 LAB — CBC
HCT: 46.8 % — ABNORMAL HIGH (ref 36.0–46.0)
Hemoglobin: 15.5 g/dL — ABNORMAL HIGH (ref 12.0–15.0)
MCH: 30.5 pg (ref 26.0–34.0)
MCHC: 33.1 g/dL (ref 30.0–36.0)
MCV: 91.9 fL (ref 80.0–100.0)
Platelets: 381 10*3/uL (ref 150–400)
RBC: 5.09 MIL/uL (ref 3.87–5.11)
RDW: 12.8 % (ref 11.5–15.5)
WBC: 10.8 10*3/uL — ABNORMAL HIGH (ref 4.0–10.5)
nRBC: 0 % (ref 0.0–0.2)

## 2021-04-30 LAB — PROTIME-INR
INR: 1 (ref 0.8–1.2)
Prothrombin Time: 13.1 seconds (ref 11.4–15.2)

## 2021-04-30 MED ORDER — SODIUM CHLORIDE 0.9% FLUSH: 5.0000 mL | Freq: Three times a day (TID) | INTRAVENOUS | Status: DC

## 2021-04-30 MED ORDER — LIDOCAINE HCL 1 % IJ SOLN
INTRAMUSCULAR | Status: AC
  Filled 2021-04-30: qty 10

## 2021-04-30 MED ORDER — FENTANYL CITRATE (PF) 100 MCG/2ML IJ SOLN
INTRAMUSCULAR | Status: AC
  Filled 2021-04-30: qty 4

## 2021-04-30 MED ORDER — SODIUM CHLORIDE 0.9 % IV SOLN: INTRAVENOUS | Status: DC

## 2021-04-30 MED ORDER — NORMAL SALINE FLUSH 0.9 % IV SOLN: 3.0000 mL | Freq: Two times a day (BID) | INTRAVENOUS | 0 refills | Status: AC

## 2021-04-30 MED ORDER — MIDAZOLAM HCL 2 MG/2ML IJ SOLN
INTRAMUSCULAR | Status: AC
  Filled 2021-04-30: qty 4

## 2021-04-30 MED ORDER — FENTANYL CITRATE (PF) 100 MCG/2ML IJ SOLN
INTRAMUSCULAR | Status: AC | PRN
  Administered 2021-04-30 (×4): 50 ug via INTRAVENOUS

## 2021-04-30 MED ORDER — MIDAZOLAM HCL 2 MG/2ML IJ SOLN
INTRAMUSCULAR | Status: AC | PRN
  Administered 2021-04-30 (×4): 1 mg via INTRAVENOUS

## 2021-04-30 MED ORDER — NORMAL SALINE FLUSH 0.9 % IV SOLN
INTRAVENOUS | 0 refills | Status: DC
  Filled 2021-04-30: qty 300, 30d supply, fill #0

## 2021-04-30 MED ORDER — NORMAL SALINE FLUSH 0.9 % IV SOLN
INTRAVENOUS | 0 refills | Status: DC
Start: 2021-04-30 — End: 2021-06-10
  Filled 2021-04-30: qty 280, 14d supply, fill #0

## 2021-04-30 NOTE — H&P (Signed)
Chief Complaint: Right subpulmonic fluid collection   Referring Physician(s): Lightfoot  Supervising Physician: Marliss Coots  Patient Status: [facility] - Out-pt  History of Present Illness: Rhonda Colon is a 46 y.o. female status post robotic assisted diaphragmatic hernia with mesh by Dr. Cliffton Asters on 02/24/21.  She was seen in follow up on 04/04/2021 and reported some increased pain in her epigastrium.  CXR done 04/04/2021 showed = Unchanged opacity right chest reflecting large diaphragmatic hernia.  CT scan done 04/11/2021 showed = Status post surgical repair for Morgagni hernia with large space-occupying fluid collection which appears complicated in the lower right hemithorax, and small postoperative fluid collection in the upper abdomen, as above, likely postoperative seromas. No gas is identified within either of these collections to clearly indicate infection, but clinical correlation is recommended.   She is here today for placement of a drain.  She is NPO. She is very anxious. No nausea/vomiting. No Fever/chills. ROS negative.   Past Medical History:  Diagnosis Date   CAD (coronary artery disease)    a. 07/2011 NSTEMI/Cath: RCA 99%m, otw nl cors, EF 50%, inf hk -> RCA stented with 3.0x53mm Promus DES   Depression    Hyperlipidemia    Hypertension    Morgagni hernia    a. Discovered incidentally on CT 07/2011 ->f/u with Dr. Lindie Spruce   Myocardial infarction [facility])    Obesity    Sinus bradycardia    a. Nocturnal, asymptomatic   Sun allergy    Tobacco abuse    a. quit 07/2011    Past Surgical History:  Procedure Laterality Date   CARDIAC CATHETERIZATION  08/10/2011   left heart, with angiogram; DES mid RCA   LEFT HEART CATHETERIZATION WITH CORONARY ANGIOGRAM N/A 08/10/2011   Procedure: LEFT HEART CATHETERIZATION WITH CORONARY ANGIOGRAM;  Surgeon: Kathleene Hazel, MD;  Location: [facility] CATH LAB;  Service: Cardiovascular;  Laterality: N/A;   TONSILLECTOMY       Allergies: Patient has no known allergies.  Medications: Prior to Admission medications   Medication Sig Start Date End Date Taking? Authorizing Provider  acetaminophen (TYLENOL) 500 MG tablet Take 500 mg by mouth every 6 (six) hours as needed for moderate pain.   Yes [provider]  ALPRAZolam Prudy Feeler) 1 MG tablet Take 1 tablet (1 mg total) by mouth 2 (two) times daily as needed for anxiety. 03/10/21  Yes Baxley, Luanna Cole, MD  FLUoxetine (PROZAC) 40 MG capsule Take 1 capsule (40 mg total) by mouth daily. 03/10/21  Yes Baxley, Luanna Cole, MD  lisinopril (ZESTRIL) 10 MG tablet Take 1 tablet (10 mg total) by mouth daily. 06/17/20  Yes Rollene Rotunda, MD  Multiple Vitamin (MULTIVITAMIN) capsule Take 1 capsule by mouth daily.   Yes [provider]  omeprazole (PRILOSEC) 20 MG capsule Take 1 capsule by mouth once daily 03/09/21  Yes Baxley, Luanna Cole, MD  rosuvastatin (CRESTOR) 40 MG tablet Take 1 tablet by mouth once daily 04/24/21  Yes Rollene Rotunda, MD  aspirin 81 MG tablet Take 81 mg by mouth daily.    [provider]  cetirizine (ZYRTEC) 10 MG tablet Take 10 mg by mouth daily as needed for allergies.    [provider]  nitroGLYCERIN (NITROSTAT) 0.4 MG SL tablet Place 1 tablet (0.4 mg total) under the tongue every 5 (five) minutes as needed for chest pain. 06/17/20   Rollene Rotunda, MD     Family History  Problem Relation Age of Onset   Coronary artery disease  Mother 1352        stent   Heart attack Mother    Heart disease Mother    Hyperlipidemia Mother    Obesity Mother    Colon cancer Maternal Uncle    Arrhythmia Father    Depression Father    Coronary artery disease Maternal Aunt [id number]       (Mother's twin sister)   Lung cancer Maternal Aunt    Coronary artery disease Maternal Uncle 46   Heart attack Maternal Aunt        mother's twin   Heart attack Maternal Uncle    Breast cancer Maternal Grandmother    Cancer Neg Hx    Early death Neg Hx     Hypertension Neg Hx    Kidney disease Neg Hx    Stroke Neg Hx    Alcohol abuse Neg Hx    Diabetes Neg Hx    Drug abuse Neg Hx     Social History   Socioeconomic History   Marital status: Significant Other    Spouse name: Not on file   Number of children: 0   Years of education: Not on file   Highest education level: Not on file  Occupational History   Occupation: Realtor  Tobacco Use   Smoking status: Former    Packs/day: 1.00    Years: 18.00    Pack years: 18.00    Types: Cigarettes    Quit date: 08/09/2011    Years since quitting: 9.7   Smokeless tobacco: Never   Tobacco comments:    2013  Vaping Use   Vaping Use: Never used  Substance and Sexual Activity   Alcohol use: Yes    Alcohol/week: 4.0 standard drinks    Types: 4 Glasses of wine per week    Comment: occassional 4 times aweek    Drug use: No   Sexual activity: Yes    Birth control/protection: None, Pill  Other Topics Concern   Not on file  Social History Narrative   Lives alone.  Occasional EtOH.  Works as a Veterinary surgeonrealtor.   Social Determinants of Health   Financial Resource Strain: Not on file  Food Insecurity: Not on file  Transportation Needs: Not on file  Physical Activity: Not on file  Stress: Not on file  Social Connections: Not on file     Review of Systems: A 12 point ROS discussed and pertinent positives are indicated in the HPI above.  All other systems are negative.  Review of Systems  Vital Signs: BP (!) 164/98   Pulse 93   Temp 97.9 F (36.6 C) (Oral)   Resp 18   Ht 5' 4.5" (1.638 m)   Wt 300 lb (136.1 kg)   SpO2 98%   BMI 50.70 kg/m   Physical Exam Vitals reviewed.  Constitutional:      Appearance: Normal appearance.  HENT:     Head: Normocephalic and atraumatic.  Eyes:     Extraocular Movements: Extraocular movements intact.  Cardiovascular:     Rate and Rhythm: Normal rate and regular rhythm.  Pulmonary:     Effort: Pulmonary effort is normal. No respiratory  distress.     Breath sounds: Normal breath sounds.  Abdominal:     Palpations: Abdomen is soft.     Tenderness: There is abdominal tenderness.  Musculoskeletal:        General: Normal range of motion.     Cervical back: Normal range of motion.  Skin:    General:  Skin is warm and dry.  Neurological:     General: No focal deficit present.     Mental Status: She is alert and oriented to person, place, and time.  Psychiatric:        Mood and Affect: Mood normal.        Behavior: Behavior normal.        Thought Content: Thought content normal.        Judgment: Judgment normal.    Imaging: DG Chest 2 View  Result Date: 04/04/2021 CLINICAL DATA:  Coronary artery disease EXAM: CHEST - 2 VIEW COMPARISON:  02/25/2021 FINDINGS: Unchanged opacity right chest reflecting large diaphragmatic hernia. Heart size appears normal. There is probable atelectasis adjacent to the hernia. Lungs otherwise appear clear. Chronic lower thoracic compression fracture with kyphotic deformity. IMPRESSION: No acute process in the chest. Electronically Signed   By: Guadlupe SpanishPraneil  Patel M.D.   On: 04/04/2021 10:55   CT Chest Wo Contrast  Result Date: 04/12/2021 CLINICAL DATA:  46 year old female with history of Morgagni hernia status post surgical repair November 2022. EXAM: CT CHEST WITHOUT CONTRAST TECHNIQUE: Multidetector CT imaging of the chest was performed following the standard protocol without IV contrast. RADIATION DOSE REDUCTION: This exam was performed according to the departmental dose-optimization program which includes automated exposure control, adjustment of the mA and/or kV according to patient size and/or use of iterative reconstruction technique. COMPARISON:  Chest CT 07/30/2020. FINDINGS: Cardiovascular: Heart size is normal. There is no significant pericardial fluid, thickening or pericardial calcification. There is aortic atherosclerosis, as well as atherosclerosis of the great vessels of the mediastinum and  the coronary arteries, including calcified atherosclerotic plaque in the left main, left anterior descending, left circumflex and right coronary arteries. Mediastinum/Nodes: No pathologically enlarged mediastinal or hilar lymph nodes. Please note that accurate exclusion of hilar adenopathy is limited on noncontrast CT scans. Esophagus is unremarkable in appearance. No axillary lymphadenopathy. Lungs/Pleura: In the lower right hemithorax at the site of the previously repaired Morgagni hernia there is a large amount of fluid which appears potentially complex with probable areas of internal septation (poorly evaluated on today's noncontrast CT examination), estimated to measure approximately 14.7 x 11.2 x 10.6 cm (axial image 86 of series 2 and coronal image 96 of series 3). There is also some residual fat in the lower right hemithorax. This is associated with near complete passive atelectasis of the right middle lobe which is displaced inferiorly, medially and posteriorly. No acute consolidative airspace disease. No pleural effusions. No definite suspicious appearing pulmonary nodules or masses are noted. Upper Abdomen: Soft tissue stranding and fluid in the upper abdomen, presumably related to recent surgical repair for Morgagni hernia. The largest portion of this scratch at the largest focus of well-defined fluid in this region (axial image 125 of series 2 and sagittal image 106 of series 4) measures 4.8 x 2.9 x 6.2 cm, likely a postoperative seroma. Diffuse low attenuation throughout the visualized hepatic parenchyma, indicative of hepatic steatosis. Aortic atherosclerosis. Musculoskeletal: Chronic L1 vertebral body compression fracture with 60% loss of anterior vertebral body height. There are no aggressive appearing lytic or blastic lesions noted in the visualized portions of the skeleton. IMPRESSION: 1. Status post surgical repair for Morgagni hernia with large space-occupying fluid collection which appears  complicated in the lower right hemithorax, and small postoperative fluid collection in the upper abdomen, as above, likely postoperative seromas. No gas is identified within either of these collections to clearly indicate infection, but clinical correlation is recommended.  2. Aortic atherosclerosis, in addition to left main and three-vessel coronary artery disease. Please note that although the presence of coronary artery calcium documents the presence of coronary artery disease, the severity of this disease and any potential stenosis cannot be assessed on this non-gated CT examination. Assessment for potential risk factor modification, dietary therapy or pharmacologic therapy may be warranted, if clinically indicated. Aortic Atherosclerosis (ICD10-I70.0). Electronically Signed   By: Trudie Reed M.D.   On: 04/12/2021 10:35    Labs:  CBC: Recent Labs    02/21/21 1017 02/24/21 1645 02/25/21 0436 04/30/21 0835  WBC 9.5 12.4* 11.9* 10.8*  HGB 14.8 14.5 14.0 15.5*  HCT 46.4* 42.8 42.6 46.8*  PLT 319 304 318 381    COAGS: Recent Labs    02/21/21 1017 04/30/21 0835  INR 1.0 1.0  APTT 28  --     BMP: Recent Labs    06/11/20 1113 07/30/20 0944 07/30/20 1117 02/21/21 1017 02/24/21 1645 02/25/21 0436  NA 138 140  --  138  --  138  K 4.6 4.9  --  4.1  --  3.8  CL 97 104  --  107  --  102  CO2 21 29  --  23  --  27  GLUCOSE 99 95 CANCELED 107*  --  120*  BUN 10 11  --  9  --  7  CALCIUM 9.2 9.0  --  9.0  --  8.8*  CREATININE 0.68 0.74  --  0.64 0.74 0.84  GFRNONAA  --  97  --  >60 >60 >60  GFRAA  --  113  --   --   --   --     LIVER FUNCTION TESTS: Recent Labs    06/11/20 1113 07/30/20 0944 11/04/20 1110 02/21/21 1017  BILITOT 0.5 0.3 0.2 0.7  AST 93* 54* 44* 58*  ALT 75* 48* 44* 58*  ALKPHOS 113  --  91 72  PROT 7.2 6.2 7.1 6.7  ALBUMIN 4.3  --  4.2 3.3*    TUMOR MARKERS: No results for input(s): AFPTM, CEA, CA199, CHROMGRNA in the last 8760  hours.  Assessment and Plan:  Status post surgical repair for Morgagni hernia with large space-occupying fluid collection which appears complicated in the lower right hemithorax, and small postoperative fluid collection in the upper abdomen, as above, likely postoperative seromas.   Will proceed with image guided drain placement on the right today by Dr. Elby Showers.  Risks and benefits discussed with the patient including bleeding, infection, damage to adjacent structures, bowel perforation/fistula connection, and sepsis.  All of the patient's questions were answered, patient is agreeable to proceed. Consent signed and in chart.  Thank you for allowing our service to participate in Rhonda Colon 's care.  Electronically Signed: Gwynneth Macleod, PA-C   04/30/2021, 10:07 AM      I spent a total of  30 Minutes in face to face in clinical consultation, greater than 50% of which was counseling/coordinating care for drain placement.

## 2021-04-30 NOTE — Procedures (Signed)
Interventional Radiology Procedure Note  Procedure: CT guided right subpulmonic drain placement  Findings: Please refer to procedural dictation for full description. 10.2 Fr pigtail placed in collection.  Approximately 750 mL dark sanguinous output.  Placed to bag drainage.  Complications: None immediate  Estimated Blood Loss: < 19mL  Recommendations: Keep to bag drainage. Flush with 10 mL saline daily. IR will arrange follow up in [facility].   Marliss Coots, MD Pager: [phone]

## 2021-05-02 ENCOUNTER — Ambulatory Visit (INDEPENDENT_AMBULATORY_CARE_PROVIDER_SITE_OTHER): Payer: Self-pay | Admitting: Thoracic Surgery (Cardiothoracic Vascular Surgery)

## 2021-05-02 ENCOUNTER — Other Ambulatory Visit: Payer: Self-pay

## 2021-05-02 DIAGNOSIS — Q790 Congenital diaphragmatic hernia: Secondary | ICD-10-CM

## 2021-05-02 NOTE — Progress Notes (Signed)
°   °  [address]       [place] [address]             [phone]       Patient: Home Provider: Office Consent for Telemedicine visit obtained.  Todays visit was completed via a real-time telehealth (see specific modality noted below). The patient/authorized person provided oral consent at the time of the visit to engage in a telemedicine encounter with the present provider at [facility]. The patient/authorized person was informed of the potential benefits, limitations, and risks of telemedicine. The patient/authorized person expressed understanding that the laws that protect confidentiality also apply to telemedicine. The patient/authorized person acknowledged understanding that telemedicine does not provide emergency services and that he or she would need to call 911 or proceed to the nearest hospital for help if such a need arose.   Total time spent in the clinical discussion 10 minutes.  Telehealth Modality: Phone visit (audio only)  I had a telephone visit with Mrs. Rye.  She underwent an image guided drain placement and her super diaphragmatic fluid collection.  Initially 1 L of fluid was removed with improvement in her respiratory status.  Since being placed she has had scant output.  She is scheduled to meet with interventional radiology on Monday for potential drain removal.  I will see her back in 1 month with a chest x-ray.

## 2021-05-05 ENCOUNTER — Other Ambulatory Visit: Payer: Self-pay

## 2021-05-05 ENCOUNTER — Encounter: Payer: Self-pay | Admitting: Radiology

## 2021-05-05 ENCOUNTER — Other Ambulatory Visit: Payer: Self-pay | Admitting: Thoracic Surgery (Cardiothoracic Vascular Surgery)

## 2021-05-05 ENCOUNTER — Ambulatory Visit
Admission: RE | Admit: 2021-05-05 | Discharge: 2021-05-05 | Disposition: A | Discharge status: Home / Self Care | Payer: 59 | Source: Ambulatory Visit | Attending: Thoracic Surgery (Cardiothoracic Vascular Surgery) | Admitting: Thoracic Surgery (Cardiothoracic Vascular Surgery)

## 2021-05-05 ENCOUNTER — Ambulatory Visit
Admission: RE | Admit: 2021-05-05 | Discharge: 2021-05-05 | Disposition: A | Discharge status: Home / Self Care | Payer: 59 | Source: Ambulatory Visit | Attending: Physician Assistant | Admitting: Physician Assistant

## 2021-05-05 DIAGNOSIS — Z4682 Encounter for fitting and adjustment of non-vascular catheter: Secondary | ICD-10-CM | POA: Diagnosis not present

## 2021-05-05 DIAGNOSIS — Q790 Congenital diaphragmatic hernia: Secondary | ICD-10-CM

## 2021-05-05 DIAGNOSIS — K5730 Diverticulosis of large intestine without perforation or abscess without bleeding: Secondary | ICD-10-CM | POA: Diagnosis not present

## 2021-05-05 DIAGNOSIS — I700 Atherosclerosis of aorta: Secondary | ICD-10-CM | POA: Diagnosis not present

## 2021-05-05 DIAGNOSIS — T888XXA Other specified complications of surgical and medical care, not elsewhere classified, initial encounter: Secondary | ICD-10-CM

## 2021-05-05 HISTORY — PX: IR RADIOLOGIST EVAL & MGMT: IMG5224

## 2021-05-05 MED ORDER — IOPAMIDOL (ISOVUE-300) INJECTION 61%
100.0000 mL | Freq: Once | INTRAVENOUS | Status: AC | PRN
  Administered 2021-05-05: 100 mL via INTRAVENOUS

## 2021-05-05 NOTE — Progress Notes (Signed)
Referring Physician(s): Gershon Crane  Chief Complaint: Right chest fluid collection   History of present illness:  The patient is seen in follow up today s/p right chest drain placement on 04/30/21 for treatment of post op fluid collection s/p diaphragmatic hernia repair.  She feels well and states that her pain and shortness of breath has improved.  She does have chest wall pain which is related to the drain.  She reports 5 mL daily output from the drain.  She has not had any fever or chills.  Past Medical History:  Diagnosis Date   CAD (coronary artery disease)    a. 07/2011 NSTEMI/Cath: RCA 99%m, otw nl cors, EF 50%, inf hk -> RCA stented with 3.0x52mm Promus DES   Depression    Hyperlipidemia    Hypertension    Morgagni hernia    a. Discovered incidentally on CT 07/2011 ->f/u with Dr. Lindie Spruce   Myocardial infarction [facility])    Obesity    Sinus bradycardia    a. Nocturnal, asymptomatic   Sun allergy    Tobacco abuse    a. quit 07/2011    Past Surgical History:  Procedure Laterality Date   CARDIAC CATHETERIZATION  08/10/2011   left heart, with angiogram; DES mid RCA   IR RADIOLOGIST EVAL & MGMT  05/05/2021   LEFT HEART CATHETERIZATION WITH CORONARY ANGIOGRAM N/A 08/10/2011   Procedure: LEFT HEART CATHETERIZATION WITH CORONARY ANGIOGRAM;  Surgeon: Kathleene Hazel, MD;  Location: [facility] CATH LAB;  Service: Cardiovascular;  Laterality: N/A;   TONSILLECTOMY      Allergies: Patient has no known allergies.  Medications: Prior to Admission medications   Medication Sig Start Date End Date Taking? Authorizing Provider  acetaminophen (TYLENOL) 500 MG tablet Take 500 mg by mouth every 6 (six) hours as needed for moderate pain.    [provider]  ALPRAZolam Prudy Feeler) 1 MG tablet Take 1 tablet (1 mg total) by mouth 2 (two) times daily as needed for anxiety. 03/10/21   Margaree Mackintosh, MD  aspirin 81 MG tablet Take 81 mg by mouth daily.    [provider]   cetirizine (ZYRTEC) 10 MG tablet Take 10 mg by mouth daily as needed for allergies.    [provider]  FLUoxetine (PROZAC) 40 MG capsule Take 1 capsule (40 mg total) by mouth daily. 03/10/21   Margaree Mackintosh, MD  lisinopril (ZESTRIL) 10 MG tablet Take 1 tablet (10 mg total) by mouth daily. 06/17/20   Rollene Rotunda, MD  Multiple Vitamin (MULTIVITAMIN) capsule Take 1 capsule by mouth daily.    [provider]  nitroGLYCERIN (NITROSTAT) 0.4 MG SL tablet Place 1 tablet (0.4 mg total) under the tongue every 5 (five) minutes as needed for chest pain. 06/17/20   Rollene Rotunda, MD  omeprazole (PRILOSEC) 20 MG capsule Take 1 capsule by mouth once daily 03/09/21   Margaree Mackintosh, MD  rosuvastatin (CRESTOR) 40 MG tablet Take 1 tablet by mouth once daily 04/24/21   Rollene Rotunda, MD  Sodium Chloride Flush (NORMAL SALINE FLUSH) 0.9 % SOLN 3-5 mLs by Intracatheter route 2 (two) times daily for 14 days. 04/30/21 05/14/21  Gershon Crane, PA-C  Sodium Chloride Flush (NORMAL SALINE FLUSH) 0.9 % SOLN Use 3 to 5 mls to flush intracatheterly 2 times daily 04/30/21     Sodium Chloride Flush (NORMAL SALINE FLUSH) 0.9 % SOLN Use as directed 04/30/21   Suttle, Thressa Sheller, MD     Family History  Problem  Relation Age of Onset   Coronary artery disease Mother 44        stent   Heart attack Mother    Heart disease Mother    Hyperlipidemia Mother    Obesity Mother    Colon cancer Maternal Uncle    Arrhythmia Father    Depression Father    Coronary artery disease Maternal Aunt 23       (Mother's twin sister)   Lung cancer Maternal Aunt    Coronary artery disease Maternal Uncle 46   Heart attack Maternal Aunt        mother's twin   Heart attack Maternal Uncle    Breast cancer Maternal Grandmother    Cancer Neg Hx    Early death Neg Hx    Hypertension Neg Hx    Kidney disease Neg Hx    Stroke Neg Hx    Alcohol abuse Neg Hx    Diabetes Neg Hx    Drug abuse Neg Hx     Social History    Socioeconomic History   Marital status: Significant Other    Spouse name: Not on file   Number of children: 0   Years of education: Not on file   Highest education level: Not on file  Occupational History   Occupation: Realtor  Tobacco Use   Smoking status: Former    Packs/day: 1.00    Years: 18.00    Pack years: 18.00    Types: Cigarettes    Quit date: 08/09/2011    Years since quitting: 9.7   Smokeless tobacco: Never   Tobacco comments:    2013  Vaping Use   Vaping Use: Never used  Substance and Sexual Activity   Alcohol use: Yes    Alcohol/week: 4.0 standard drinks    Types: 4 Glasses of wine per week    Comment: occassional 4 times aweek    Drug use: No   Sexual activity: Yes    Birth control/protection: None, Pill  Other Topics Concern   Not on file  Social History Narrative   Lives alone.  Occasional EtOH.  Works as a Veterinary surgeon.   Social Determinants of Health   Financial Resource Strain: Not on file  Food Insecurity: Not on file  Transportation Needs: Not on file  Physical Activity: Not on file  Stress: Not on file  Social Connections: Not on file   Vital Signs: There were no vitals taken for this visit.  Physical Exam Constitutional:      Appearance: Normal appearance.  Pulmonary:     Effort: Pulmonary effort is normal.  Abdominal:     Palpations: Abdomen is soft.  Skin:    General: Skin is warm and dry.  Neurological:     Mental Status: She is alert and oriented to person, place, and time.  Psychiatric:        Behavior: Behavior normal.    Imaging: IR Radiologist Eval & Mgmt  Result Date: 05/05/2021 Please refer to notes tab for details about interventional procedure. (Op Note)   Labs:  CBC: Recent Labs    02/21/21 1017 02/24/21 1645 02/25/21 0436 04/30/21 0835  WBC 9.5 12.4* 11.9* 10.8*  HGB 14.8 14.5 14.0 15.5*  HCT 46.4* 42.8 42.6 46.8*  PLT 319 304 318 381    COAGS: Recent Labs    02/21/21 1017 04/30/21 0835  INR 1.0  1.0  APTT 28  --     BMP: Recent Labs    06/11/20 1113 07/30/20 0944 07/30/20  1117 02/21/21 1017 02/24/21 1645 02/25/21 0436  NA 138 140  --  138  --  138  K 4.6 4.9  --  4.1  --  3.8  CL 97 104  --  107  --  102  CO2 21 29  --  23  --  27  GLUCOSE 99 95 CANCELED 107*  --  120*  BUN 10 11  --  9  --  7  CALCIUM 9.2 9.0  --  9.0  --  8.8*  CREATININE 0.68 0.74  --  0.64 0.74 0.84  GFRNONAA  --  97  --  >60 >60 >60  GFRAA  --  113  --   --   --   --     LIVER FUNCTION TESTS: Recent Labs    06/11/20 1113 07/30/20 0944 11/04/20 1110 02/21/21 1017  BILITOT 0.5 0.3 0.2 0.7  AST 93* 54* 44* 58*  ALT 75* 48* 44* 58*  ALKPHOS 113  --  91 72  PROT 7.2 6.2 7.1 6.7  ALBUMIN 4.3  --  4.2 3.3*    Assessment and Plan: 46 year old woman with right lower chest drain for treatment of post op fluid collection, returns to IR clinic for CT and drain check.  She reports minimal output from the drain and improved shortness of breath.  CT performed today shows interval decrease in size of right chest fluid collection.  Fluoroscopy drain injection shows minimal residual cavity.  The drain was removed without difficulty.  Electronically Signed: Al Corpus Jaylan Hinojosa 05/05/2021, 3:17 PM   I spent a total of 10 Minutes in face to face in clinical consultation, greater than 50% of which was counseling/coordinating care for right chest drain.

## 2021-05-06 LAB — AEROBIC/ANAEROBIC CULTURE W GRAM STAIN (SURGICAL/DEEP WOUND)

## 2021-05-08 ENCOUNTER — Other Ambulatory Visit (HOSPITAL_COMMUNITY): Payer: Self-pay

## 2021-05-13 ENCOUNTER — Other Ambulatory Visit: Payer: 59

## 2021-05-16 ENCOUNTER — Ambulatory Visit (INDEPENDENT_AMBULATORY_CARE_PROVIDER_SITE_OTHER): Payer: 59 | Admitting: Internal Medicine

## 2021-05-16 ENCOUNTER — Encounter (HOSPITAL_COMMUNITY): Payer: Self-pay | Admitting: *Deleted

## 2021-05-16 ENCOUNTER — Inpatient Hospital Stay (HOSPITAL_COMMUNITY)
Admission: EM | Admit: 2021-05-16 | Discharge: 2021-06-05 | DRG: 907 | Disposition: A | Discharge status: Home / Self Care | Payer: 59 | Attending: Thoracic Surgery (Cardiothoracic Vascular Surgery) | Admitting: Thoracic Surgery (Cardiothoracic Vascular Surgery)

## 2021-05-16 ENCOUNTER — Telehealth: Payer: Self-pay

## 2021-05-16 ENCOUNTER — Encounter: Payer: Self-pay | Admitting: Internal Medicine

## 2021-05-16 ENCOUNTER — Other Ambulatory Visit: Payer: Self-pay

## 2021-05-16 ENCOUNTER — Emergency Department (HOSPITAL_COMMUNITY): Payer: 59

## 2021-05-16 ENCOUNTER — Ambulatory Visit
Admission: RE | Admit: 2021-05-16 | Discharge: 2021-05-16 | Disposition: A | Discharge status: Home / Self Care | Payer: 59 | Source: Ambulatory Visit | Attending: Internal Medicine | Admitting: Internal Medicine

## 2021-05-16 VITALS — BP 100/70 | HR 105 | Temp 99.3°F

## 2021-05-16 DIAGNOSIS — Y832 Surgical operation with anastomosis, bypass or graft as the cause of abnormal reaction of the patient, or of later complication, without mention of misadventure at the time of the procedure: Secondary | ICD-10-CM | POA: Diagnosis present

## 2021-05-16 DIAGNOSIS — T8579XA Infection and inflammatory reaction due to other internal prosthetic devices, implants and grafts, initial encounter: Principal | ICD-10-CM | POA: Diagnosis present

## 2021-05-16 DIAGNOSIS — I493 Ventricular premature depolarization: Secondary | ICD-10-CM | POA: Diagnosis not present

## 2021-05-16 DIAGNOSIS — R509 Fever, unspecified: Secondary | ICD-10-CM | POA: Diagnosis not present

## 2021-05-16 DIAGNOSIS — Z818 Family history of other mental and behavioral disorders: Secondary | ICD-10-CM

## 2021-05-16 DIAGNOSIS — F419 Anxiety disorder, unspecified: Secondary | ICD-10-CM | POA: Diagnosis present

## 2021-05-16 DIAGNOSIS — G629 Polyneuropathy, unspecified: Secondary | ICD-10-CM | POA: Diagnosis present

## 2021-05-16 DIAGNOSIS — E872 Acidosis, unspecified: Secondary | ICD-10-CM | POA: Diagnosis present

## 2021-05-16 DIAGNOSIS — L299 Pruritus, unspecified: Secondary | ICD-10-CM | POA: Diagnosis not present

## 2021-05-16 DIAGNOSIS — B3731 Acute candidiasis of vulva and vagina: Secondary | ICD-10-CM | POA: Diagnosis present

## 2021-05-16 DIAGNOSIS — R0602 Shortness of breath: Secondary | ICD-10-CM | POA: Diagnosis not present

## 2021-05-16 DIAGNOSIS — Z803 Family history of malignant neoplasm of breast: Secondary | ICD-10-CM

## 2021-05-16 DIAGNOSIS — Z9889 Other specified postprocedural states: Secondary | ICD-10-CM

## 2021-05-16 DIAGNOSIS — Z6841 Body Mass Index (BMI) 40.0 and over, adult: Secondary | ICD-10-CM

## 2021-05-16 DIAGNOSIS — R059 Cough, unspecified: Secondary | ICD-10-CM

## 2021-05-16 DIAGNOSIS — Z9689 Presence of other specified functional implants: Secondary | ICD-10-CM

## 2021-05-16 DIAGNOSIS — J9811 Atelectasis: Secondary | ICD-10-CM | POA: Diagnosis not present

## 2021-05-16 DIAGNOSIS — I2510 Atherosclerotic heart disease of native coronary artery without angina pectoris: Secondary | ICD-10-CM | POA: Diagnosis present

## 2021-05-16 DIAGNOSIS — J853 Abscess of mediastinum: Secondary | ICD-10-CM | POA: Diagnosis present

## 2021-05-16 DIAGNOSIS — A419 Sepsis, unspecified organism: Secondary | ICD-10-CM | POA: Diagnosis present

## 2021-05-16 DIAGNOSIS — L02213 Cutaneous abscess of chest wall: Secondary | ICD-10-CM | POA: Diagnosis not present

## 2021-05-16 DIAGNOSIS — R52 Pain, unspecified: Secondary | ICD-10-CM

## 2021-05-16 DIAGNOSIS — J869 Pyothorax without fistula: Principal | ICD-10-CM | POA: Diagnosis present

## 2021-05-16 DIAGNOSIS — D75839 Thrombocytosis, unspecified: Secondary | ICD-10-CM | POA: Diagnosis not present

## 2021-05-16 DIAGNOSIS — A4189 Other specified sepsis: Secondary | ICD-10-CM | POA: Diagnosis present

## 2021-05-16 DIAGNOSIS — F418 Other specified anxiety disorders: Secondary | ICD-10-CM | POA: Diagnosis present

## 2021-05-16 DIAGNOSIS — E785 Hyperlipidemia, unspecified: Secondary | ICD-10-CM | POA: Diagnosis present

## 2021-05-16 DIAGNOSIS — J90 Pleural effusion, not elsewhere classified: Secondary | ICD-10-CM | POA: Diagnosis present

## 2021-05-16 DIAGNOSIS — B954 Other streptococcus as the cause of diseases classified elsewhere: Secondary | ICD-10-CM | POA: Diagnosis present

## 2021-05-16 DIAGNOSIS — Z955 Presence of coronary angioplasty implant and graft: Secondary | ICD-10-CM

## 2021-05-16 DIAGNOSIS — Z8249 Family history of ischemic heart disease and other diseases of the circulatory system: Secondary | ICD-10-CM

## 2021-05-16 DIAGNOSIS — Z800 Family history of malignant neoplasm of digestive organs: Secondary | ICD-10-CM

## 2021-05-16 DIAGNOSIS — I700 Atherosclerosis of aorta: Secondary | ICD-10-CM | POA: Diagnosis not present

## 2021-05-16 DIAGNOSIS — Z801 Family history of malignant neoplasm of trachea, bronchus and lung: Secondary | ICD-10-CM

## 2021-05-16 DIAGNOSIS — J189 Pneumonia, unspecified organism: Secondary | ICD-10-CM

## 2021-05-16 DIAGNOSIS — Z20822 Contact with and (suspected) exposure to covid-19: Secondary | ICD-10-CM | POA: Diagnosis present

## 2021-05-16 DIAGNOSIS — I252 Old myocardial infarction: Secondary | ICD-10-CM

## 2021-05-16 DIAGNOSIS — Z7982 Long term (current) use of aspirin: Secondary | ICD-10-CM

## 2021-05-16 DIAGNOSIS — Z83438 Family history of other disorder of lipoprotein metabolism and other lipidemia: Secondary | ICD-10-CM

## 2021-05-16 DIAGNOSIS — R21 Rash and other nonspecific skin eruption: Secondary | ICD-10-CM | POA: Diagnosis not present

## 2021-05-16 DIAGNOSIS — Z79899 Other long term (current) drug therapy: Secondary | ICD-10-CM

## 2021-05-16 DIAGNOSIS — F32A Depression, unspecified: Secondary | ICD-10-CM | POA: Diagnosis present

## 2021-05-16 DIAGNOSIS — Z87891 Personal history of nicotine dependence: Secondary | ICD-10-CM

## 2021-05-16 DIAGNOSIS — R000 Tachycardia, unspecified: Secondary | ICD-10-CM | POA: Diagnosis not present

## 2021-05-16 DIAGNOSIS — I10 Essential (primary) hypertension: Secondary | ICD-10-CM | POA: Diagnosis present

## 2021-05-16 DIAGNOSIS — R600 Localized edema: Secondary | ICD-10-CM | POA: Diagnosis not present

## 2021-05-16 DIAGNOSIS — T8141XA Infection following a procedure, superficial incisional surgical site, initial encounter: Secondary | ICD-10-CM | POA: Diagnosis present

## 2021-05-16 LAB — COMPLETE METABOLIC PANEL WITH GFR
AG Ratio: 0.9 (calc) — ABNORMAL LOW (ref 1.0–2.5)
ALT: 24 U/L (ref 6–29)
AST: 23 U/L (ref 10–35)
Albumin: 3.6 g/dL (ref 3.6–5.1)
Alkaline phosphatase (APISO): 107 U/L (ref 31–125)
BUN: 10 mg/dL (ref 7–25)
CO2: 22 mmol/L (ref 20–32)
Calcium: 9.6 mg/dL (ref 8.6–10.2)
Chloride: 99 mmol/L (ref 98–110)
Creat: 0.79 mg/dL (ref 0.50–0.99)
Globulin: 3.8 g/dL (calc) — ABNORMAL HIGH (ref 1.9–3.7)
Glucose, Bld: 108 mg/dL — ABNORMAL HIGH (ref 65–99)
Potassium: 4.1 mmol/L (ref 3.5–5.3)
Sodium: 134 mmol/L — ABNORMAL LOW (ref 135–146)
Total Bilirubin: 0.7 mg/dL (ref 0.2–1.2)
Total Protein: 7.4 g/dL (ref 6.1–8.1)
eGFR: 93 mL/min/{1.73_m2} (ref >=60)

## 2021-05-16 LAB — COMPREHENSIVE METABOLIC PANEL
ALT: 28 U/L (ref 0–44)
AST: 27 U/L (ref 15–41)
Albumin: 3 g/dL — ABNORMAL LOW (ref 3.5–5.0)
Alkaline Phosphatase: 96 U/L (ref 38–126)
Anion gap: 12 (ref 5–15)
BUN: 8 mg/dL (ref 6–20)
CO2: 25 mmol/L (ref 22–32)
Calcium: 9.2 mg/dL (ref 8.9–10.3)
Chloride: 97 mmol/L — ABNORMAL LOW (ref 98–111)
Creatinine, Ser: 0.71 mg/dL (ref 0.44–1.00)
GFR, Estimated: >60 mL/min (ref >60)
Glucose, Bld: 153 mg/dL — ABNORMAL HIGH (ref 70–99)
Potassium: 4 mmol/L (ref 3.5–5.1)
Sodium: 134 mmol/L — ABNORMAL LOW (ref 135–145)
Total Bilirubin: 0.7 mg/dL (ref 0.3–1.2)
Total Protein: 7.7 g/dL (ref 6.5–8.1)

## 2021-05-16 LAB — CBC WITH DIFFERENTIAL/PLATELET
Absolute Monocytes: 1562 cells/uL — ABNORMAL HIGH (ref 200–950)
Basophils Absolute: 48 cells/uL (ref 0–200)
Basophils Relative: 0.3 %
Eosinophils Absolute: 32 cells/uL (ref 15–500)
Eosinophils Relative: 0.2 %
HCT: 43 % (ref 35.0–45.0)
Hemoglobin: 14.6 g/dL (ref 11.7–15.5)
Lymphs Abs: 3445 cells/uL (ref 850–3900)
MCH: 29.7 pg (ref 27.0–33.0)
MCHC: 34 g/dL (ref 32.0–36.0)
MCV: 87.6 fL (ref 80.0–100.0)
MPV: 10.7 fL (ref 7.5–12.5)
Monocytes Relative: 9.7 %
Neutro Abs: 11012 cells/uL — ABNORMAL HIGH (ref 1500–7800)
Neutrophils Relative %: 68.4 %
Platelets: 482 10*3/uL — ABNORMAL HIGH (ref 140–400)
RBC: 4.91 10*6/uL (ref 3.80–5.10)
RDW: 12.7 % (ref 11.0–15.0)
Total Lymphocyte: 21.4 %
WBC: 16.1 10*3/uL — ABNORMAL HIGH (ref 3.8–10.8)

## 2021-05-16 LAB — URINALYSIS, ROUTINE W REFLEX MICROSCOPIC
Bacteria, UA: NONE SEEN
Bilirubin Urine: NEGATIVE
Glucose, UA: NEGATIVE mg/dL
Hgb urine dipstick: NEGATIVE
Ketones, ur: 20 mg/dL — AB
Leukocytes,Ua: NEGATIVE
Nitrite: NEGATIVE
Protein, ur: 30 mg/dL — AB
Specific Gravity, Urine: 1.024 (ref 1.005–1.030)
pH: 5 (ref 5.0–8.0)

## 2021-05-16 LAB — RESP PANEL BY RT-PCR (FLU A&B, COVID) ARPGX2
Influenza A by PCR: NEGATIVE
Influenza B by PCR: NEGATIVE
SARS Coronavirus 2 by RT PCR: NEGATIVE

## 2021-05-16 LAB — CBC
HCT: 44.5 % (ref 36.0–46.0)
Hemoglobin: 14.5 g/dL (ref 12.0–15.0)
MCH: 29.5 pg (ref 26.0–34.0)
MCHC: 32.6 g/dL (ref 30.0–36.0)
MCV: 90.4 fL (ref 80.0–100.0)
Platelets: 479 10*3/uL — ABNORMAL HIGH (ref 150–400)
RBC: 4.92 MIL/uL (ref 3.87–5.11)
RDW: 12.7 % (ref 11.5–15.5)
WBC: 13.9 10*3/uL — ABNORMAL HIGH (ref 4.0–10.5)
nRBC: 0 % (ref 0.0–0.2)

## 2021-05-16 LAB — POCT INFLUENZA A/B
Influenza A, POC: NEGATIVE
Influenza B, POC: NEGATIVE

## 2021-05-16 LAB — I-STAT BETA HCG BLOOD, ED (MC, WL, AP ONLY): I-stat hCG, quantitative: <5 m[IU]/mL (ref <5)

## 2021-05-16 LAB — POC COVID19 BINAXNOW: SARS Coronavirus 2 Ag: NEGATIVE

## 2021-05-16 LAB — LACTIC ACID, PLASMA
Lactic Acid, Venous: 1.2 mmol/L (ref 0.5–1.9)
Lactic Acid, Venous: <0.3 mmol/L — ABNORMAL LOW (ref 0.5–1.9)

## 2021-05-16 LAB — LIPASE, BLOOD: Lipase: 27 U/L (ref 11–51)

## 2021-05-16 MED ORDER — ALPRAZOLAM 0.25 MG PO TABS
0.5000 mg | ORAL_TABLET | Freq: Two times a day (BID) | ORAL | Status: DC | PRN
  Administered 2021-05-17 – 2021-05-23 (×8): 1 mg via ORAL
  Administered 2021-05-23: 21:00:00 0.5 mg via ORAL
  Administered 2021-05-24 – 2021-05-26 (×4): 1 mg via ORAL
  Administered 2021-05-27: 09:00:00 0.5 mg via ORAL
  Administered 2021-05-28: 23:00:00 1 mg via ORAL
  Administered 2021-05-29 – 2021-06-01 (×6): 0.5 mg via ORAL
  Administered 2021-06-02 – 2021-06-05 (×7): 1 mg via ORAL
  Filled 2021-05-16: qty 2
  Filled 2021-05-16: qty 4
  Filled 2021-05-16: qty 2
  Filled 2021-05-16 (×5): qty 4
  Filled 2021-05-16: qty 2
  Filled 2021-05-16 (×9): qty 4
  Filled 2021-05-16: qty 2
  Filled 2021-05-16 (×3): qty 4
  Filled 2021-05-16: qty 2
  Filled 2021-05-16 (×2): qty 4
  Filled 2021-05-16: qty 2
  Filled 2021-05-16 (×3): qty 4

## 2021-05-16 MED ORDER — FLUOXETINE HCL 20 MG PO CAPS
40.0000 mg | ORAL_CAPSULE | Freq: Every day | ORAL | Status: DC
  Administered 2021-05-17 – 2021-06-05 (×20): 40 mg via ORAL
  Filled 2021-05-16 (×20): qty 2

## 2021-05-16 MED ORDER — ONDANSETRON HCL 4 MG/2ML IJ SOLN
4.0000 mg | Freq: Four times a day (QID) | INTRAMUSCULAR | Status: DC | PRN
  Administered 2021-05-17 – 2021-05-22 (×2): 4 mg via INTRAVENOUS
  Filled 2021-05-16: qty 2

## 2021-05-16 MED ORDER — SODIUM CHLORIDE 0.9 % IV SOLN: 2.0000 g | Freq: Three times a day (TID) | INTRAVENOUS | Status: DC

## 2021-05-16 MED ORDER — LISINOPRIL 10 MG PO TABS
10.0000 mg | ORAL_TABLET | Freq: Every day | ORAL | Status: DC
  Administered 2021-05-17 – 2021-06-02 (×17): 10 mg via ORAL
  Filled 2021-05-16 (×17): qty 1

## 2021-05-16 MED ORDER — MORPHINE SULFATE (PF) 4 MG/ML IV SOLN
4.0000 mg | Freq: Once | INTRAVENOUS | Status: AC
  Administered 2021-05-16: 4 mg via INTRAVENOUS
  Filled 2021-05-16: qty 1

## 2021-05-16 MED ORDER — ONDANSETRON HCL 4 MG PO TABS: 4.0000 mg | ORAL_TABLET | Freq: Four times a day (QID) | ORAL | Status: DC | PRN

## 2021-05-16 MED ORDER — IOHEXOL 350 MG/ML SOLN
75.0000 mL | Freq: Once | INTRAVENOUS | Status: AC | PRN
  Administered 2021-05-16: 75 mL via INTRAVENOUS

## 2021-05-16 MED ORDER — CEFTRIAXONE SODIUM 1 G IJ SOLR
1.0000 g | Freq: Once | INTRAMUSCULAR | Status: AC
  Administered 2021-05-16: 1 g via INTRAMUSCULAR

## 2021-05-16 MED ORDER — MORPHINE SULFATE (PF) 2 MG/ML IV SOLN
2.0000 mg | INTRAVENOUS | Status: DC | PRN
  Administered 2021-05-17 – 2021-05-18 (×3): 2 mg via INTRAVENOUS
  Filled 2021-05-16 (×3): qty 1

## 2021-05-16 MED ORDER — VANCOMYCIN HCL IN DEXTROSE 1-5 GM/200ML-% IV SOLN
1000.0000 mg | Freq: Once | INTRAVENOUS | Status: AC
  Administered 2021-05-16: 1000 mg via INTRAVENOUS
  Filled 2021-05-16: qty 200

## 2021-05-16 MED ORDER — ACETAMINOPHEN 325 MG PO TABS
650.0000 mg | ORAL_TABLET | Freq: Four times a day (QID) | ORAL | Status: DC | PRN
  Administered 2021-05-17 – 2021-06-05 (×42): 650 mg via ORAL
  Filled 2021-05-16 (×43): qty 2

## 2021-05-16 MED ORDER — PIPERACILLIN-TAZOBACTAM 3.375 G IVPB 30 MIN
3.3750 g | Freq: Once | INTRAVENOUS | Status: AC
Start: 2021-05-16 — End: 2021-05-16
  Administered 2021-05-16: 3.375 g via INTRAVENOUS
  Filled 2021-05-16: qty 50

## 2021-05-16 MED ORDER — SENNOSIDES-DOCUSATE SODIUM 8.6-50 MG PO TABS: 1.0000 | ORAL_TABLET | Freq: Every evening | ORAL | Status: DC | PRN

## 2021-05-16 MED ORDER — PANTOPRAZOLE SODIUM 40 MG PO TBEC
40.0000 mg | DELAYED_RELEASE_TABLET | Freq: Every day | ORAL | Status: DC
Start: 2021-05-17 — End: 2021-06-05
  Administered 2021-05-17 – 2021-06-05 (×20): 40 mg via ORAL
  Filled 2021-05-16 (×20): qty 1

## 2021-05-16 MED ORDER — ASPIRIN EC 81 MG PO TBEC: 81.0000 mg | DELAYED_RELEASE_TABLET | Freq: Every day | ORAL | Status: DC

## 2021-05-16 MED ORDER — ROSUVASTATIN CALCIUM 20 MG PO TABS
40.0000 mg | ORAL_TABLET | Freq: Every evening | ORAL | Status: DC
  Administered 2021-05-17 – 2021-06-05 (×20): 40 mg via ORAL
  Filled 2021-05-16 (×21): qty 2

## 2021-05-16 MED ORDER — ACETAMINOPHEN 650 MG RE SUPP: 650.0000 mg | Freq: Four times a day (QID) | RECTAL | Status: DC | PRN

## 2021-05-16 MED ORDER — VANCOMYCIN HCL 1500 MG/300ML IV SOLN
1500.0000 mg | Freq: Once | INTRAVENOUS | Status: AC
  Administered 2021-05-17: 1500 mg via INTRAVENOUS
  Filled 2021-05-16: qty 300

## 2021-05-16 MED ORDER — PIPERACILLIN-TAZOBACTAM 3.375 G IVPB
3.3750 g | Freq: Three times a day (TID) | INTRAVENOUS | Status: DC
  Administered 2021-05-17 – 2021-05-20 (×10): 3.375 g via INTRAVENOUS
  Filled 2021-05-16 (×10): qty 50

## 2021-05-16 MED ORDER — VANCOMYCIN HCL 750 MG/150ML IV SOLN
750.0000 mg | Freq: Three times a day (TID) | INTRAVENOUS | Status: DC
  Administered 2021-05-17 – 2021-05-18 (×3): 750 mg via INTRAVENOUS
  Filled 2021-05-16 (×6): qty 150

## 2021-05-16 NOTE — Telephone Encounter (Signed)
scheduled

## 2021-05-16 NOTE — ED Provider Triage Note (Signed)
Emergency Medicine Provider Triage Evaluation Note  Rhonda Colon , a 46 y.o. female  was evaluated in triage.  Pt complains of fever, chills, shortness of breath, bodyaches. States that same began on Tuesday. Patient had repair of diaphragmatic hernia in November, same was drained at the beginning of February when it was noted that there was fluid collecting around the site. States that she went to her PCP today for her symptoms, thought she might have COVID but was negative. CXR revealed large fluid collection in the chest once again, was sent here to have this fluid collection drained and for further evaluation with CT chest.  Review of Systems  Positive: Fever, chills, SOB Negative: Chest pain, n/v  Physical Exam  BP (!) 159/94 (BP Location: Right Arm)    Pulse (!) 123    Temp 99.9 F (37.7 C) (Oral)    Resp 19    Ht 5\' 4"  (1.626 m)    Wt (!) 136.1 kg    SpO2 98%    BMI 51.50 kg/m  Gen:   Awake, no distress   Resp:  Normal effort  MSK:   Moves extremities without difficulty  Other:    Medical Decision Making  Medically screening exam initiated at 5:17 PM.  Appropriate orders placed.  Rhonda Colon was informed that the remainder of the evaluation will be completed by another provider, this initial triage assessment does not replace that evaluation, and the importance of remaining in the ED until their evaluation is complete.  Concern for chest abscess, will get blood cultures, lactic and CT chest   , PA-C 05/16/21 1722

## 2021-05-16 NOTE — ED Provider Notes (Signed)
[facility] Provider Note   CSN: [id number] Arrival date & time: 05/16/21  1521     History  Chief Complaint  Patient presents with   Abdominal Pain    Arvis A Debroah Ballerolbert is a 46 y.o. female hx of Morgagni hernia here presenting with fever and shortness of breath.  Patient has been running fever for the last 3 days.  She states that Tmax is about 103 at home.  Patient had diaphragmatic hernia repair in November by Dr. Cliffton AstersLightfoot.  Patient states that on February 1, she had IR drain that was placed to drain the fluid and then was subsequently removed.  Patient went to PCP office today and had a chest x-ray that showed air-fluid level and possible abscess in the chest.  Dr. Cliffton AstersLightfoot was consulted and recommend CT and admission.  The history is provided by the patient.      Home Medications Prior to Admission medications   Medication Sig Start Date End Date Taking? Authorizing Provider  acetaminophen (TYLENOL) 500 MG tablet Take 500 mg by mouth every 6 (six) hours as needed for moderate pain.    [provider]  ALPRAZolam Prudy Feeler(XANAX) 1 MG tablet Take 1 tablet (1 mg total) by mouth 2 (two) times daily as needed for anxiety. 03/10/21   Margaree MackintoshBaxley, Mary J, MD  aspirin 81 MG tablet Take 81 mg by mouth daily.    [provider]  cetirizine (ZYRTEC) 10 MG tablet Take 10 mg by mouth daily as needed for allergies.    [provider]  FLUoxetine (PROZAC) 40 MG capsule Take 1 capsule (40 mg total) by mouth daily. 03/10/21   Margaree MackintoshBaxley, Mary J, MD  lisinopril (ZESTRIL) 10 MG tablet Take 1 tablet (10 mg total) by mouth daily. 06/17/20   Rollene RotundaHochrein, James, MD  Multiple Vitamin (MULTIVITAMIN) capsule Take 1 capsule by mouth daily.    [provider]  nitroGLYCERIN (NITROSTAT) 0.4 MG SL tablet Place 1 tablet (0.4 mg total) under the tongue every 5 (five) minutes as needed for chest pain. 06/17/20   Rollene RotundaHochrein, James, MD  omeprazole (PRILOSEC) 20 MG  capsule Take 1 capsule by mouth once daily 03/09/21   Margaree MackintoshBaxley, Mary J, MD  rosuvastatin (CRESTOR) 40 MG tablet Take 1 tablet by mouth once daily 04/24/21   Rollene RotundaHochrein, James, MD  Sodium Chloride Flush (NORMAL SALINE FLUSH) 0.9 % SOLN Use 3 to 5 mls to flush intracatheterly 2 times daily 04/30/21     Sodium Chloride Flush (NORMAL SALINE FLUSH) 0.9 % SOLN Use as directed 04/30/21   Suttle, Thressa Shellerylan J, MD      Allergies    Wound dressing adhesive    Review of Systems   Review of Systems  Gastrointestinal:  Positive for abdominal pain.  All other systems reviewed and are negative.  Physical Exam Updated Vital Signs BP 110/78 (BP Location: Left Arm)    Pulse (!) 103    Temp 98.4 F (36.9 C) (Oral)    Resp 17    Ht 5\' 4"  (1.626 m)    Wt (!) 136.1 kg    SpO2 98%    BMI 51.50 kg/m  Physical Exam Vitals and nursing note reviewed.  HENT:     Head: Normocephalic.     Mouth/Throat:     Mouth: Mucous membranes are moist.  Eyes:     Extraocular Movements: Extraocular movements intact.  Cardiovascular:     Rate and Rhythm: Normal rate and regular rhythm.  Heart sounds: Normal heart sounds.  Pulmonary:     Effort: Pulmonary effort is normal.     Comments: Diminished on the right side.  Patient also has previous scars from chest drain that is healing well Abdominal:     General: Abdomen is flat.  Skin:    General: Skin is warm.     Capillary Refill: Capillary refill takes less than 2 seconds.  Neurological:     General: No focal deficit present.     Mental Status: She is alert and oriented to person, place, and time.  Psychiatric:        Mood and Affect: Mood normal.        Behavior: Behavior normal.    ED Results / Procedures / Treatments   Labs (all labs ordered are listed, but only abnormal results are displayed) Labs Reviewed  COMPREHENSIVE METABOLIC PANEL - Abnormal; Notable for the following components:      Result Value   Sodium 134 (*)    Chloride 97 (*)    Glucose, Bld 153 (*)     Albumin 3.0 (*)    All other components within normal limits  CBC - Abnormal; Notable for the following components:   WBC 13.9 (*)    Platelets 479 (*)    All other components within normal limits  URINALYSIS, ROUTINE W REFLEX MICROSCOPIC - Abnormal; Notable for the following components:   Color, Urine AMBER (*)    APPearance HAZY (*)    Ketones, ur 20 (*)    Protein, ur 30 (*)    All other components within normal limits  LACTIC ACID, PLASMA - Abnormal; Notable for the following components:   Lactic Acid, Venous <0.3 (*)    All other components within normal limits  CULTURE, BLOOD (ROUTINE X 2)  CULTURE, BLOOD (ROUTINE X 2)  LIPASE, BLOOD  LACTIC ACID, PLASMA  I-STAT BETA HCG BLOOD, ED ([facility], [facility], [facility] ONLY)    EKG None  Radiology DG Chest 2 View  Addendum Date: 05/16/2021   ADDENDUM REPORT: 05/16/2021 14:09 ADDENDUM: Critical Value/emergent results were called by telephone at the time of interpretation on 05/16/2021 at 2:08 pm to provider [facility] , who verbally acknowledged these results. Electronically Signed   By: Signa Kell M.D.   On: 05/16/2021 14:09   Result Date: 05/16/2021 CLINICAL DATA:  Evaluate for pneumonia. Cough for 2 weeks. Complications status post hernia repair EXAM: CHEST - 2 VIEW COMPARISON:  04/11/2021 FINDINGS: Stable heart size. No pleural effusion or interstitial edema. Large mass in the region of the right CP angle is identified with air-fluid level measuring 10.6 x 11.1 cm. The left lung appears clear. No acute airspace opacities identified. IMPRESSION: Interval reaccumulation of right lower chest fluid collection now with air-fluid level. Recommend further evaluation with repeat CT of the chest with contrast material and/or referral to thoracic surgery or interventional radiology. Electronically Signed: By: Signa Kell M.D. On: 05/16/2021 13:35   CT Chest W Contrast  Result Date: 05/16/2021 CLINICAL DATA:  Lung/mediastinal abscess. Chest hernia  repair November 30th. Recent subdiaphragmatic drain placement. EXAM: CT CHEST WITH CONTRAST TECHNIQUE: Multidetector CT imaging of the chest was performed during intravenous contrast administration. RADIATION DOSE REDUCTION: This exam was performed according to the departmental dose-optimization program which includes automated exposure control, adjustment of the mA and/or kV according to patient size and/or use of iterative reconstruction technique. CONTRAST:  21mL OMNIPAQUE IOHEXOL 350 MG/ML SOLN COMPARISON:  Radiograph earlier today. Lung bases from abdominal CT  05/05/2021, chest CT 04/11/2021 FINDINGS: Cardiovascular: The heart is normal in size. There are coronary artery calcifications. Slight leftward cardiac deviation due to right mediastinal fluid collection. Thoracic aorta is normal in caliber. Mediastinum/Nodes: Right paramediastinal fluid collection, interval removal of drainage catheter from prior abdominal CT. Collection measures 10.1 x 10.4 x 8.2 cm (TR by AP by CC), measured on series 3 image 100 on series 5 image 87. Air-fluid level, approximately 60% fluid. There is surrounding fat stranding and inflammatory change. This appears to be supradiaphragmatic in location. Small amount of non organized fluid tracks anterior inferiorly. There are small adjacent epicardial lymph nodes. Small mediastinal lymph nodes are not enlarged by size criteria. No hilar adenopathy. No esophageal wall thickening. Lungs/Pleura: Compressive atelectasis in the anterior right lung related to anterior mediastinal collection. The lungs are otherwise clear. No pleural effusion or pneumothorax. No pulmonary nodule. Trachea and central bronchi are patent. Upper Abdomen: Stranding and small amount of ill-defined fluid in the anterior upper abdomen anterior to the liver. This appears similar to abdominal CT 11 days ago. Hepatic steatosis. Musculoskeletal: There are no acute or suspicious osseous abnormalities. No subcutaneous fluid  collection at site of drain. IMPRESSION: 1. Reaccumulation of right paramediastinal air fluid collection after removal of drainage catheter. Collection measures 10.1 x 10.4 x 8.2 cm, with surrounding fat stranding and inflammatory change. This appears to be supradiaphragmatic in location. 2. Stranding and small amount of ill-defined fluid in the anterior upper abdomen anterior to the liver, similar to abdominal CT 11 days ago. 3. Aortic atherosclerosis and coronary artery calcifications Electronically Signed   By: Narda Rutherford M.D.   On: 05/16/2021 21:20    Procedures Procedures    Medications Ordered in ED Medications  piperacillin-tazobactam (ZOSYN) IVPB 3.375 g (has no administration in time range)  vancomycin (VANCOCIN) IVPB 1000 mg/200 mL premix (has no administration in time range)  iohexol (OMNIPAQUE) 350 MG/ML injection 75 mL (75 mLs Intravenous Contrast Given 05/16/21 2101)  morphine (PF) 4 MG/ML injection 4 mg (4 mg Intravenous Given 05/16/21 2124)    ED Course/ Medical Decision Making/ A&P                           Medical Decision Making Madolin A Chao is a 46 y.o. female here presenting with fever and possible chest abscess.  This is a recurrent problem.  Patient had an abscess that was drained by IR 2 weeks ago.  Patient has been running fever for the last 3 days.  Concern for possible sepsis from reaccumulation of the abscess.  Plan to get CBC and CMP and lactate and culture and CT chest  9:25 PM CT chest showed reaccumulation of the abscess.  Patient's WBC is 16 earlier today and is 13 right now.  I discussed case with Dr. Dorris Fetch from CT surgery.  He recommends that IR put in another drain tomorrow and patient needs broad-spectrum antibiotics.  Ordered Zosyn and vancomycin.  Hospitalist to admit at this point for sepsis from abscess in the chest    Problems Addressed: Abscess of chest [facility]): chronic illness or injury with exacerbation, progression, or side effects of  treatment Sepsis, due to unspecified organism, unspecified whether acute organ dysfunction present [facility]): acute illness or injury  Amount and/or Complexity of Data Reviewed Labs: ordered. Decision-making details documented in ED Course. Radiology: ordered and independent interpretation performed. Decision-making details documented in ED Course.  Risk Prescription drug management. Decision regarding hospitalization.  Final Clinical Impression(s) / ED Diagnoses Final diagnoses:  Abscess of chest (HCC)  Sepsis, due to unspecified organism, unspecified whether acute organ dysfunction present [facility])    Rx / DC Orders ED Discharge Orders     None         Charlynne Pander, MD 05/16/21 2127

## 2021-05-16 NOTE — Progress Notes (Addendum)
Pharmacy Antibiotic Note  Rhonda Colon is a 46 y.o. female admitted on 05/16/2021 with sepsis.  Pharmacy has been consulted for Vancomycin / Cefepime dosing.  Plan: Vancomycin 1500 mg iv x 1 dose now to make 2500 mg loading dose Vancomycin 750 mg iv Q 8 hours afterwards (AUC of 496 using Scr of 0.8)  Zosyn 3.375 grams iv Q 8 hours Follow Scr, cultures, progress   Height: 5\' 4"  (162.6 cm) Weight: (!) 136.1 kg (300 lb 0.7 oz) IBW/kg (Calculated) : 54.7  Temp (24hrs), Avg:99.5 F (37.5 C), Min:98.4 F (36.9 C), Max:100.3 F (37.9 C)  Recent Labs  Lab 05/16/21 1224 05/16/21 1521 05/16/21 1716 05/16/21 2009  WBC 16.1* 13.9*  --   --   CREATININE 0.79 0.71  --   --   LATICACIDVEN  --   --  1.2 <0.3*    Estimated Creatinine Clearance: 119.8 mL/min (by C-G formula based on SCr of 0.71 mg/dL).    Allergies  Allergen Reactions   Wound Dressing Adhesive Dermatitis and Rash    Thank you for allowing pharmacy to be a part of this patients care.  05/18/21 05/16/2021 11:06 PM

## 2021-05-16 NOTE — H&P (Signed)
History and Physical    Rhonda FriendlyLorie A Stephens ZOX:[id number] DOB: 11/09/1975 DOA: 05/16/2021  PCP: Margaree MackintoshBaxley, Mary J, MD  Patient coming from: Home  I have personally briefly reviewed patient's [facility]  Chief Complaint: Fevers, chills, myalgias  HPI: Rhonda Colon is a 46 y.o. female with medical history significant for CAD s/p DES to RCA in 2013, HTN, HLD, depression, obesity, Morgagni hernia s/p diaphragmatic hernia repair 02/24/2021 who presented to the ED for evaluation of fevers, chills, myalgias.  Patient underwent diaphragmatic hernia repair 02/24/2021 by cardiothoracic surgery, Dr. Cliffton AstersLightfoot.  She was found to have a complicated large fluid collection in the lower right hemithorax on 04/11/2021.  She underwent IR guided drain placement on 04/30/2020 which drained 750 cc dark red fluid.  Fluid cultures collected at that time grew rare cutibacterium avidum.  Repeat CT imaging 05/05/2021 showed significant interval improvement of right lower chest fluid collection with minimal fluid still remaining and chest drain was subsequently removed.  Patient saw her PCP today with several days of fever, chills, diaphoresis, headache, myalgias.  She was given 1 g IM ceftriaxone in office.  Chest x-ray was obtained which showed reaccumulation of fluid in the right lower chest now with air-fluid level.  Her PCP discussed with CT surgery, Dr. Cliffton AstersLightfoot, who recommended she present to the ED for expedited evaluation and admission.  Patient states that she has also been experiencing some shortness of breath with occasional nonproductive cough.  She has had some nausea with nonbloody bloody emesis.  ED Course   Labs/Imaging on admission: I have personally reviewed following labs and imaging studies.  Initial vitals showed BP 159/94, pulse 123, RR 19, temp 99.9 F, SPO2 98% on room air.  Tmax 100.3 F while in the ED.  Labs show WBC 13.9, hemoglobin 14.5, platelets 479,000, sodium 134,  potassium 4.0, bicarb 25, BUN 8, creatinine 0.71, serum glucose 153, lactic acid 1.2, i-STAT beta-hCG <5.0.  Blood cultures collected and in process.  Urinalysis negative for UTI.  CT chest with contrast showed reaccumulation of right paramediastinal air-fluid collection measuring 10.1 x 10.4 x 8.2 cm with surrounding fat stranding and inflammatory change.  Appears to be subdiaphragmatic in location.  Stranding and small amount of ill-defined fluid in the anterior upper abdomen anterior to the liver is seen, similar to abdominal CT 11 days prior.  EDP discussed with on-call CT surgery, Dr. Dorris FetchHendrickson, who recommended broad-spectrum antibiotics, IR guided drain, medical admission and they will evaluate in AM.  Patient was given IV vancomycin and Zosyn.  The hospitalist service was consulted to admit for further evaluation and management.  Review of Systems: All systems reviewed and are negative except as documented in history of present illness above.   Past Medical History:  Diagnosis Date   CAD (coronary artery disease)    a. 07/2011 NSTEMI/Cath: RCA 99%m, otw nl cors, EF 50%, inf hk -> RCA stented with 3.0x6916mm Promus DES   Depression    Hyperlipidemia    Hypertension    Morgagni hernia    a. Discovered incidentally on CT 07/2011 ->f/u with Dr. Lindie SpruceWyatt   Myocardial infarction [facility](HCC)    Obesity    Sinus bradycardia    a. Nocturnal, asymptomatic   Sun allergy    Tobacco abuse    a. quit 07/2011    Past Surgical History:  Procedure Laterality Date   CARDIAC CATHETERIZATION  08/10/2011   left heart, with angiogram; DES mid RCA   IR RADIOLOGIST  EVAL & MGMT  05/05/2021   LEFT HEART CATHETERIZATION WITH CORONARY ANGIOGRAM N/A 08/10/2011   Procedure: LEFT HEART CATHETERIZATION WITH CORONARY ANGIOGRAM;  Surgeon: Kathleene Hazel, MD;  Location: [facility] CATH LAB;  Service: Cardiovascular;  Laterality: N/A;   TONSILLECTOMY      Social History:  reports that she quit smoking about 9 years  ago. Her smoking use included cigarettes. She has a 18.00 pack-year smoking history. She has never used smokeless tobacco. She reports current alcohol use of about 4.0 standard drinks per week. She reports that she does not use drugs.  Allergies  Allergen Reactions   Wound Dressing Adhesive Dermatitis and Rash    Family History  Problem Relation Age of Onset   Coronary artery disease Mother 41        stent   Heart attack Mother    Heart disease Mother    Hyperlipidemia Mother    Obesity Mother    Colon cancer Maternal Uncle    Arrhythmia Father    Depression Father    Coronary artery disease Maternal Aunt 57       (Mother's twin sister)   Lung cancer Maternal Aunt    Coronary artery disease Maternal Uncle 46   Heart attack Maternal Aunt        mother's twin   Heart attack Maternal Uncle    Breast cancer Maternal Grandmother    Cancer Neg Hx    Early death Neg Hx    Hypertension Neg Hx    Kidney disease Neg Hx    Stroke Neg Hx    Alcohol abuse Neg Hx    Diabetes Neg Hx    Drug abuse Neg Hx      Prior to Admission medications   Medication Sig Start Date End Date Taking? Authorizing Provider  acetaminophen (TYLENOL) 500 MG tablet Take 500 mg by mouth every 6 (six) hours as needed for moderate pain or fever.   Yes [provider]  ALPRAZolam Prudy Feeler) 1 MG tablet Take 1 tablet (1 mg total) by mouth 2 (two) times daily as needed for anxiety. Patient taking differently: Take 0.5-1 mg by mouth 2 (two) times daily as needed for anxiety. 03/10/21  Yes BaxleyLuanna Cole, MD  aspirin 81 MG tablet Take 81 mg by mouth daily.   Yes [provider]  cetirizine (ZYRTEC) 10 MG tablet Take 10 mg by mouth daily as needed for allergies.   Yes [provider]  FLUoxetine (PROZAC) 40 MG capsule Take 1 capsule (40 mg total) by mouth daily. 03/10/21  Yes Baxley, Luanna Cole, MD  lisinopril (ZESTRIL) 10 MG tablet Take 1 tablet (10 mg total) by mouth daily. 06/17/20  Yes Rollene Rotunda, MD  Multiple Vitamin (MULTIVITAMIN) capsule Take 1 capsule by mouth daily.   Yes [provider]  nitroGLYCERIN (NITROSTAT) 0.4 MG SL tablet Place 1 tablet (0.4 mg total) under the tongue every 5 (five) minutes as needed for chest pain. 06/17/20  Yes Rollene Rotunda, MD  omeprazole (PRILOSEC) 20 MG capsule Take 1 capsule by mouth once daily 03/09/21  Yes Baxley, Luanna Cole, MD  rosuvastatin (CRESTOR) 40 MG tablet Take 1 tablet by mouth once daily 04/24/21  Yes Rollene Rotunda, MD  Sodium Chloride Flush (NORMAL SALINE FLUSH) 0.9 % SOLN Use 3 to 5 mls to flush intracatheterly 2 times daily 04/30/21     Sodium Chloride Flush (NORMAL SALINE FLUSH) 0.9 % SOLN Use as directed 04/30/21   Suttle, Thressa Sheller, MD  Physical Exam: Vitals:   05/16/21 1611 05/16/21 1806 05/16/21 1940 05/16/21 2030  BP:  129/79 110/78 118/69  Pulse:  (!) 117 (!) 103 (!) 103  Resp:  16 17 16   Temp:  100.3 F (37.9 C) 98.4 F (36.9 C)   TempSrc:  Oral Oral   SpO2:  94% 98% 93%  Weight: (!) 136.1 kg     Height: 5\' 4"  (1.626 m)      Constitutional: Resting in bed, NAD, calm, comfortable Eyes: PERRL, lids and conjunctivae normal ENMT: Mucous membranes are moist. Posterior pharynx clear of any exudate or lesions.Normal dentition.  Neck: normal, supple, no masses. Respiratory: clear to auscultation bilaterally, no wheezing, no crackles. Normal respiratory effort. No accessory muscle use.  Cardiovascular: Regular rate and rhythm, no murmurs / rubs / gallops. No extremity edema. 2+ pedal pulses. Abdomen: no tenderness, no masses palpated. No hepatosplenomegaly. Bowel sounds positive.  Musculoskeletal: no clubbing / cyanosis. No joint deformity upper and lower extremities. Good ROM, no contractures. Normal muscle tone.  Skin: no rashes, lesions, ulcers. No induration Neurologic: CN 2-12 grossly intact. Sensation intact. Strength 5/5 in all 4.  Psychiatric: Normal judgment and insight. Alert and oriented x 3. Normal  mood.   EKG: Personally reviewed. Sinus tachycardia, rate 123, nonspecific T wave changes inferior leads.  Rate is faster when compared to prior.  Assessment/Plan Principal Problem:   Sepsis due to intrathoracic abscess (HCC) Active Problems:   CAD (coronary artery disease)   Essential hypertension   Dyslipidemia   Depression with anxiety   Valbona A Magar is a 46 y.o. 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  Cardiothoracic surgery to follow and have recommended IR guided drain placement.  Assessment and Plan: * Sepsis due to intrathoracic abscess (HCC)- (present on admission) Patient presenting with leukocytosis, tachypnea, and tachycardia.  Found to have reaccumulation of right lower chest fluid collection.  Previously had CT-guided drain placed on 2/1 with good output which was ultimately removed on 2/6.  Has been having persistent fevers at home, chills, diaphoresis over the last several days. -CT chest shows 10.1 x 10.4 x 8.2 cm right paramediastinal air-fluid collection -Cardiothoracic surgery, Dr. 2014, consulted and will see in a.m. -IR guided drain placement recommended, order placed -Continue broad-spectrum antibiotics with IV vancomycin and Zosyn -Follow blood cultures -Keep n.p.o. after midnight  CAD (coronary artery disease)- (present on admission) S/p DES to RCA in 2013.  Stable, denies any chest pain.  Continue aspirin and statin.  Essential hypertension- (present on admission) Continue lisinopril 10 mg daily.  Dyslipidemia- (present on admission) Continue rosuvastatin 40 mg daily.  Depression with anxiety- (present on admission) Continue home Prozac 40 mg daily and Xanax 0.5-1 mg twice daily as needed.  DVT prophylaxis: SCDs Start:  05/16/21 2253 Code Status: Full code, confirmed with patient Family Communication: Discussed with patient, she has discussed with family Disposition Plan: From home, dispo pending clinical progress Consults called: Cardiothoracic surgery, IR Severity of Illness: The appropriate patient status for this patient is OBSERVATION. Observation status is judged to be reasonable and necessary in order to provide the required intensity of service to ensure the patient's safety. The patient's presenting symptoms, physical exam findings, and initial radiographic and laboratory data in the context of their medical condition is felt to place them at decreased  risk for further clinical deterioration. Furthermore, it is anticipated that the patient will be medically stable for discharge from the hospital within 2 midnights of admission.   Darreld Mclean MD [facility]  If 7PM-7AM, please contact night-coverage [email or web address]  05/17/2021, 12:08 AM

## 2021-05-16 NOTE — Progress Notes (Incomplete Revision)
° °  Subjective:    Patient ID: Rhonda Colon, female    DOB: Aug 24, 1975, 46 y.o.   MRN: [id number]    HPI Patient had repair of Morgagni hernia by Dr. Kipp Brood November 28.  Patient  was discharged the day after surgery. She saw Dr. Kipp Brood  for virtual post op visit December 9th and was thought to be doing well.   He saw her January 6th and she was having some increased epigastric pain, indigestion and some BRBPR. CT scan of chest with contrast  was done January 13th. Was found to have large space occupying lesion lower right hemithorax likely a post op seroma.   Had CT guided drainage  of fluid February 1st draining 750cc dark red fluid. Repeat CT scan Feb 6th showed interval improvement of right lower fluid collection.   Patient saw Dr. Kipp Brood February 3rd via virtual visit regarding image guided fluid drainage from right sub -pulmonic area with one month follow up advised.   Patient called today with fever, chills, headache, myalgias all week long. Had flu vaccine in October. Only had 2 Covid vaccines by our records. Rapid flu and rapid Covid tests are negative. Patient has low grade temp 99.3 degrees pulse 105 BP 100/70 pulse ox 97%   Covid and influenza rapid tests are negative.   See CPE May 5,2022 for complete History. Hx of HTN and hyperlipidemia seen by Dr. Percival Spanish. Hx of MI 2013.Hx of anxiety and depression. Former smoker Quit in 2013 after MI.  Social Hx: Patient is a [employer]. Single. Social alcohol consumption.  Family Hx: colon cancer in maternal uncle and great grandfather.Maternal aunt died with history of CAD,valvular disease and lung cancer in 2019.  Hx of Covid-17 April 2020.  At one point, she los 100 pounds and was noted to be 158 pounds when Dr. Percival Spanish saw her in March 2014.       Review of Systems denies vomiting. Has malaise and fatigue     Objective:   Physical Exam BP 100/70 pulse 105 regular. Tachypneic but she appears anxious. Slightly  diaphoretic. Chest with decreased BS both lower lobes and coarse breath sounds bilaterally.       Assessment & Plan:  Tachypnea, fever s/p drainage  of  subpulmonic fluid collection. Have drawn STAT CBC with diff, C-met and ordered CXR STAT. She may well need evaluation in ED. Rapid Covid and flu test are negative. Patient thought she could have contracted respiratory infection from taking mother to doctors office and having lunch with a friend. Await lab results and advise further. One gram Im Rocephin given in office.   *** NOTE: Phone call from Dr. Clovis Riley in Radiology: fluid in chest has re-accumulated. See CXR report. I have contacted Dr. Kipp Brood. We have discussed admission. Needs to have fluid drained once again. He is not on call but has on-call partners available if needed. Her WBC is 16,100 with 11,012 neutrophils.  I have spoken with Dr. Kipp Brood and with the patient. We are advising she go directly to [facility] ED for immediate evaluation. Will need drainage of chest fluid by Interventional Radiology.

## 2021-05-16 NOTE — ED Triage Notes (Signed)
The pt had a chest xray  already from her doctors office  today

## 2021-05-16 NOTE — Hospital Course (Signed)
Rhonda Colon is a 46 y.o. 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  Cardiothoracic surgery to follow and have recommended IR guided drain placement.

## 2021-05-16 NOTE — Patient Instructions (Addendum)
One gram Rocephin IM given. CBC with diff drawn and pending  After speaking with Dr. Bradly Chris in Radiology and with Dr. Cliffton Asters, I am advising pt go to  [facility] ED immediately for evaluation and admission. Will need IR to drain chest fluid and obtain cultures. She has leukocytosis. She needs IVF hydration.

## 2021-05-16 NOTE — ED Triage Notes (Signed)
The pt had a chest hernia repaired nov 30th  since feb 1st she has had fluid gathering around that hernia and the fluid has been drained off  she saw her medical doctor and was told to come  she had an antibiotic injection at   that office today  c/o a headache feeling tired  chills  temp unknown    she was tested for the flu and covid in the office  today and both were negative.  Rocephin igm was given im today in that office

## 2021-05-16 NOTE — Progress Notes (Addendum)
° °  Subjective:  ° ° Patient ID: Rhonda Colon, female    DOB: 03/12/1976, 46 y.o.   MRN: [id number] ° ° ° °HPI Patient had repair of Morgagni hernia by Dr. Lightfoot November 28.  Patient  was discharged the day after surgery. She saw Dr. Lightfoot  for virtual post op visit December 9th and was thought to be doing well. ° ° He saw her January 6th and she was having some increased epigastric pain, indigestion and some BRBPR. CT scan of chest with contrast  was done January 13th. Was found to have large space occupying lesion lower right hemithorax likely a post op seroma. ° ° Had CT guided drainage  of fluid February 1st draining 750cc dark red fluid. Repeat CT scan Feb 6th showed interval improvement of right lower fluid collection. ° ° °Patient saw Dr. Lightfoot February 3rd via virtual visit regarding image guided fluid drainage from right sub -pulmonic area with one month follow up advised. ° ° °Patient called today with fever, chills, headache, myalgias all week long. Had flu vaccine in October. Only had 2 Covid vaccines by our records. Rapid flu and rapid Covid tests are negative. Patient has low grade temp 99.3 degrees pulse 105 BP 100/70 pulse ox 97% ° ° °Covid and influenza rapid tests are negative. ° ° °See CPE May 5,2022 for complete History. Hx of HTN and hyperlipidemia seen by Dr. Hochrein. Hx of MI 2013.Hx of anxiety and depression. Former smoker Quit in 2013 after MI. ° °Social Hx: Patient is a [employer]. Single. Social alcohol consumption. ° °Family Hx: colon cancer in maternal uncle and great grandfather.Maternal aunt died with history of CAD,valvular disease and lung cancer in 2019. ° °Hx of Covid-17 April 2020. ° °At one point, she los 100 pounds and was noted to be 158 pounds when Dr. Hochrein saw her in March 2014. ° ° ° ° ° ° °Review of Systems denies vomiting. Has malaise and fatigue ° °   °Objective:  ° Physical Exam °BP 100/70 pulse 105 regular. Tachypneic but she appears anxious. °Slightly  diaphoretic. Chest with decreased BS both lower lobes and coarse breath sounds bilaterally. ° ° ° °   °Assessment & Plan:  °Tachypnea, fever s/p drainage  of  subpulmonic fluid collection. Have drawn STAT CBC with diff, C-met and ordered CXR STAT. She may well need evaluation in ED. Rapid Covid and flu test are negative. Patient thought she could have contracted respiratory infection from taking mother to doctors office and having lunch with a friend. Await lab results and advise further. One gram Im Rocephin given in office. ° ° *** NOTE: Phone call from Dr. Stroud in Radiology: fluid in chest has re-accumulated. See CXR report. I have contacted Dr. Lightfoot. We have discussed admission. Needs to have fluid drained once again. He is not on call but has on-call partners available if needed. Her WBC is 16,100 with 11,012 neutrophils. ° °I have spoken with Dr. Lightfoot and with the patient. We are advising she go directly to [facility] for immediate evaluation. Will need drainage of chest fluid by Interventional Radiology. ° °

## 2021-05-16 NOTE — Telephone Encounter (Signed)
Patient states that she needs an appt. She has a headache, chills, sweats, achy all over since last Monday. She took covid test last week negative. It went away and has since come back Covid test Tuesday was negative as well.

## 2021-05-17 ENCOUNTER — Observation Stay (HOSPITAL_COMMUNITY): Payer: 59

## 2021-05-17 ENCOUNTER — Inpatient Hospital Stay (HOSPITAL_COMMUNITY): Payer: 59

## 2021-05-17 DIAGNOSIS — Z978 Presence of other specified devices: Secondary | ICD-10-CM | POA: Diagnosis not present

## 2021-05-17 DIAGNOSIS — A4189 Other specified sepsis: Secondary | ICD-10-CM | POA: Diagnosis not present

## 2021-05-17 DIAGNOSIS — F419 Anxiety disorder, unspecified: Secondary | ICD-10-CM | POA: Diagnosis not present

## 2021-05-17 DIAGNOSIS — J90 Pleural effusion, not elsewhere classified: Secondary | ICD-10-CM | POA: Diagnosis not present

## 2021-05-17 DIAGNOSIS — Z8249 Family history of ischemic heart disease and other diseases of the circulatory system: Secondary | ICD-10-CM | POA: Diagnosis not present

## 2021-05-17 DIAGNOSIS — Z83438 Family history of other disorder of lipoprotein metabolism and other lipidemia: Secondary | ICD-10-CM | POA: Diagnosis not present

## 2021-05-17 DIAGNOSIS — Z87891 Personal history of nicotine dependence: Secondary | ICD-10-CM | POA: Diagnosis not present

## 2021-05-17 DIAGNOSIS — Y832 Surgical operation with anastomosis, bypass or graft as the cause of abnormal reaction of the patient, or of later complication, without mention of misadventure at the time of the procedure: Secondary | ICD-10-CM | POA: Diagnosis not present

## 2021-05-17 DIAGNOSIS — T8579XA Infection and inflammatory reaction due to other internal prosthetic devices, implants and grafts, initial encounter: Secondary | ICD-10-CM | POA: Diagnosis not present

## 2021-05-17 DIAGNOSIS — J189 Pneumonia, unspecified organism: Secondary | ICD-10-CM | POA: Diagnosis not present

## 2021-05-17 DIAGNOSIS — I517 Cardiomegaly: Secondary | ICD-10-CM | POA: Diagnosis not present

## 2021-05-17 DIAGNOSIS — T8143XA Infection following a procedure, organ and space surgical site, initial encounter: Secondary | ICD-10-CM | POA: Diagnosis not present

## 2021-05-17 DIAGNOSIS — J853 Abscess of mediastinum: Secondary | ICD-10-CM | POA: Diagnosis not present

## 2021-05-17 DIAGNOSIS — M7989 Other specified soft tissue disorders: Secondary | ICD-10-CM | POA: Diagnosis not present

## 2021-05-17 DIAGNOSIS — E872 Acidosis, unspecified: Secondary | ICD-10-CM | POA: Diagnosis not present

## 2021-05-17 DIAGNOSIS — R0602 Shortness of breath: Secondary | ICD-10-CM | POA: Diagnosis not present

## 2021-05-17 DIAGNOSIS — R109 Unspecified abdominal pain: Secondary | ICD-10-CM | POA: Diagnosis not present

## 2021-05-17 DIAGNOSIS — R091 Pleurisy: Secondary | ICD-10-CM | POA: Diagnosis not present

## 2021-05-17 DIAGNOSIS — F32A Depression, unspecified: Secondary | ICD-10-CM | POA: Diagnosis not present

## 2021-05-17 DIAGNOSIS — I10 Essential (primary) hypertension: Secondary | ICD-10-CM | POA: Diagnosis not present

## 2021-05-17 DIAGNOSIS — Z4682 Encounter for fitting and adjustment of non-vascular catheter: Secondary | ICD-10-CM | POA: Diagnosis not present

## 2021-05-17 DIAGNOSIS — Z803 Family history of malignant neoplasm of breast: Secondary | ICD-10-CM | POA: Diagnosis not present

## 2021-05-17 DIAGNOSIS — Z452 Encounter for adjustment and management of vascular access device: Secondary | ICD-10-CM | POA: Diagnosis not present

## 2021-05-17 DIAGNOSIS — J9811 Atelectasis: Secondary | ICD-10-CM | POA: Diagnosis not present

## 2021-05-17 DIAGNOSIS — T8189XA Other complications of procedures, not elsewhere classified, initial encounter: Secondary | ICD-10-CM | POA: Diagnosis not present

## 2021-05-17 DIAGNOSIS — R079 Chest pain, unspecified: Secondary | ICD-10-CM | POA: Diagnosis not present

## 2021-05-17 DIAGNOSIS — J984 Other disorders of lung: Secondary | ICD-10-CM | POA: Diagnosis not present

## 2021-05-17 DIAGNOSIS — A419 Sepsis, unspecified organism: Secondary | ICD-10-CM | POA: Diagnosis not present

## 2021-05-17 DIAGNOSIS — J9859 Other diseases of mediastinum, not elsewhere classified: Secondary | ICD-10-CM | POA: Diagnosis not present

## 2021-05-17 DIAGNOSIS — F418 Other specified anxiety disorders: Secondary | ICD-10-CM | POA: Diagnosis not present

## 2021-05-17 DIAGNOSIS — K449 Diaphragmatic hernia without obstruction or gangrene: Secondary | ICD-10-CM | POA: Diagnosis not present

## 2021-05-17 DIAGNOSIS — E785 Hyperlipidemia, unspecified: Secondary | ICD-10-CM | POA: Diagnosis not present

## 2021-05-17 DIAGNOSIS — R918 Other nonspecific abnormal finding of lung field: Secondary | ICD-10-CM | POA: Diagnosis not present

## 2021-05-17 DIAGNOSIS — R69 Illness, unspecified: Secondary | ICD-10-CM | POA: Diagnosis not present

## 2021-05-17 DIAGNOSIS — I2510 Atherosclerotic heart disease of native coronary artery without angina pectoris: Secondary | ICD-10-CM | POA: Diagnosis not present

## 2021-05-17 DIAGNOSIS — I252 Old myocardial infarction: Secondary | ICD-10-CM | POA: Diagnosis not present

## 2021-05-17 DIAGNOSIS — Z20822 Contact with and (suspected) exposure to covid-19: Secondary | ICD-10-CM | POA: Diagnosis not present

## 2021-05-17 DIAGNOSIS — T8141XA Infection following a procedure, superficial incisional surgical site, initial encounter: Secondary | ICD-10-CM | POA: Diagnosis not present

## 2021-05-17 DIAGNOSIS — Z79899 Other long term (current) drug therapy: Secondary | ICD-10-CM | POA: Diagnosis not present

## 2021-05-17 DIAGNOSIS — I9789 Other postprocedural complications and disorders of the circulatory system, not elsewhere classified: Secondary | ICD-10-CM | POA: Diagnosis not present

## 2021-05-17 DIAGNOSIS — Z6841 Body Mass Index (BMI) 40.0 and over, adult: Secondary | ICD-10-CM | POA: Diagnosis not present

## 2021-05-17 DIAGNOSIS — B954 Other streptococcus as the cause of diseases classified elsewhere: Secondary | ICD-10-CM | POA: Diagnosis not present

## 2021-05-17 DIAGNOSIS — Z800 Family history of malignant neoplasm of digestive organs: Secondary | ICD-10-CM | POA: Diagnosis not present

## 2021-05-17 DIAGNOSIS — J869 Pyothorax without fistula: Secondary | ICD-10-CM | POA: Diagnosis not present

## 2021-05-17 DIAGNOSIS — T8189XD Other complications of procedures, not elsewhere classified, subsequent encounter: Secondary | ICD-10-CM | POA: Diagnosis not present

## 2021-05-17 DIAGNOSIS — L0291 Cutaneous abscess, unspecified: Secondary | ICD-10-CM | POA: Diagnosis not present

## 2021-05-17 DIAGNOSIS — Z955 Presence of coronary angioplasty implant and graft: Secondary | ICD-10-CM | POA: Diagnosis not present

## 2021-05-17 LAB — BASIC METABOLIC PANEL
Anion gap: 13 (ref 5–15)
BUN: 7 mg/dL (ref 6–20)
CO2: 21 mmol/L — ABNORMAL LOW (ref 22–32)
Calcium: 8.8 mg/dL — ABNORMAL LOW (ref 8.9–10.3)
Chloride: 97 mmol/L — ABNORMAL LOW (ref 98–111)
Creatinine, Ser: 0.69 mg/dL (ref 0.44–1.00)
GFR, Estimated: >60 mL/min (ref >60)
Glucose, Bld: 116 mg/dL — ABNORMAL HIGH (ref 70–99)
Potassium: 3.6 mmol/L (ref 3.5–5.1)
Sodium: 131 mmol/L — ABNORMAL LOW (ref 135–145)

## 2021-05-17 LAB — CBC
HCT: 40.3 % (ref 36.0–46.0)
Hemoglobin: 13.1 g/dL (ref 12.0–15.0)
MCH: 29.4 pg (ref 26.0–34.0)
MCHC: 32.5 g/dL (ref 30.0–36.0)
MCV: 90.4 fL (ref 80.0–100.0)
Platelets: 436 10*3/uL — ABNORMAL HIGH (ref 150–400)
RBC: 4.46 MIL/uL (ref 3.87–5.11)
RDW: 12.8 % (ref 11.5–15.5)
WBC: 15.2 10*3/uL — ABNORMAL HIGH (ref 4.0–10.5)
nRBC: 0 % (ref 0.0–0.2)

## 2021-05-17 LAB — PROCALCITONIN: Procalcitonin: <0.1 ng/mL

## 2021-05-17 MED ORDER — LIDOCAINE HCL 1 % IJ SOLN
INTRAMUSCULAR | Status: AC
  Filled 2021-05-17: qty 10

## 2021-05-17 MED ORDER — LACTATED RINGERS IV SOLN: INTRAVENOUS | Status: AC

## 2021-05-17 MED ORDER — ASPIRIN EC 81 MG PO TBEC
81.0000 mg | DELAYED_RELEASE_TABLET | Freq: Every day | ORAL | Status: DC
  Administered 2021-05-18 – 2021-05-29 (×9): 81 mg via ORAL
  Filled 2021-05-17 (×11): qty 1

## 2021-05-17 MED ORDER — FENTANYL CITRATE (PF) 100 MCG/2ML IJ SOLN
INTRAMUSCULAR | Status: AC
  Filled 2021-05-17: qty 2

## 2021-05-17 MED ORDER — LORATADINE 10 MG PO TABS
10.0000 mg | ORAL_TABLET | Freq: Every day | ORAL | Status: DC | PRN
  Administered 2021-05-31: 10 mg via ORAL
  Filled 2021-05-17: qty 1

## 2021-05-17 MED ORDER — DIPHENHYDRAMINE HCL 50 MG/ML IJ SOLN
INTRAMUSCULAR | Status: AC
  Filled 2021-05-17: qty 1

## 2021-05-17 MED ORDER — MIDAZOLAM HCL 2 MG/2ML IJ SOLN
INTRAMUSCULAR | Status: AC
  Filled 2021-05-17: qty 4

## 2021-05-17 MED ORDER — MIDAZOLAM HCL 2 MG/2ML IJ SOLN
INTRAMUSCULAR | Status: AC | PRN
  Administered 2021-05-17 (×4): 1 mg via INTRAVENOUS

## 2021-05-17 MED ORDER — SENNOSIDES-DOCUSATE SODIUM 8.6-50 MG PO TABS
2.0000 | ORAL_TABLET | Freq: Every day | ORAL | Status: DC
  Filled 2021-05-17: qty 2

## 2021-05-17 MED ORDER — OXYCODONE HCL 5 MG PO TABS
5.0000 mg | ORAL_TABLET | Freq: Four times a day (QID) | ORAL | Status: DC | PRN
  Administered 2021-05-17 – 2021-05-22 (×16): 5 mg via ORAL
  Filled 2021-05-17 (×18): qty 1

## 2021-05-17 MED ORDER — SODIUM CHLORIDE 0.9% FLUSH
5.0000 mL | Freq: Three times a day (TID) | INTRAVENOUS | Status: DC
  Administered 2021-05-17 – 2021-05-21 (×12): 5 mL

## 2021-05-17 MED ORDER — FENTANYL CITRATE (PF) 100 MCG/2ML IJ SOLN
INTRAMUSCULAR | Status: AC | PRN
  Administered 2021-05-17 (×2): 50 ug via INTRAVENOUS

## 2021-05-17 MED ORDER — LACTATED RINGERS IV SOLN: INTRAVENOUS | Status: DC

## 2021-05-17 NOTE — Progress Notes (Signed)
° °   °  [address]       [place] [address]             [phone]      Mrs. Mohl is a 46 year old woman with a history of obesity, hyperlipidemia, coronary stent, and depression.  She underwent a robotic repair of a Morgagni hernia on 02/24/2021.  She developed a fluid collection in the chest and had a drain placed on 04/30/2021.  That did not drain a lot and was removed on 05/05/2021.  Over the past several days she has been having fevers and shaking chills.  Increased pleuritic right-sided chest pain.  Also general malaise.  She came to the emergency room last night and was started on broad-spectrum antibiotics.  She is in obvious discomfort.  Afebrile currently.  CT of the chest shows a loculated air-fluid collection in the inferior right chest.  Obvious concerns possible infection.  She has been started on broad-spectrum antibiotics.  We will admit her.  I discussed the case with Dr. Lowella Dandy of interventional radiology.  He will see her this morning regarding drain placement.  Salvatore Decent Dorris Fetch, MD [facility] [phone]

## 2021-05-17 NOTE — Assessment & Plan Note (Signed)
Patient presenting with leukocytosis, tachypnea, and tachycardia.  Found to have reaccumulation of right lower chest fluid collection.  Previously had CT-guided drain placed on 2/1 with good output which was ultimately removed on 2/6.  Has been having persistent fevers at home, chills, diaphoresis over the last several days. -CT chest shows 10.1 x 10.4 x 8.2 cm right paramediastinal air-fluid collection -Cardiothoracic surgery, Dr. Dorris Fetch, consulted and will see in a.m. -IR guided drain placement recommended, order placed -Continue broad-spectrum antibiotics with IV vancomycin and Zosyn -Follow blood cultures -Keep n.p.o. after midnight

## 2021-05-17 NOTE — Consult Note (Signed)
Chief Complaint: Patient was seen in consultation today for  Chief Complaint  Patient presents with   Abdominal Pain    Referring Physician(s):   Supervising Physician: Richarda Overlie  Patient Status: [facility] - ED  History of Present Illness: Rhonda Colon is a 46 y.o. 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 Cliffton Asters 02/24/21. She was seen in follow up 04/04/21 and reported some increased pain in her epigastric region. Imaging obtained showed a fluid collection in the lower right hemithorax and IR was consulted for outpatient drain placement. She presented to IR 04/30/21 and Dr. Elby Showers placed a right pigtail chest tube.   Ms. Darwin followed up with IR at the [facility] clinic 05/05/21 for drain evaluation. CT imaging and fluoroscopy drain injection showed minimal residual fluid and minimal residual cavity. The patient also reported scant daily output. The drain was removed.   The patient presented to the [facility] ED 05/16/21 with complaints of fevers, chills and myalgias. Imaging obtained showed re-accumulation of the fluid in the right lower chest. She has been evaluated by [facility] who recommended IR drain placement.   Interventional Radiology has been asked to evaluate this patient for an image-guided right chest tube placement. Imaging reviewed and procedure approved by Dr. Lowella Dandy.   Past Medical History:  Diagnosis Date   CAD (coronary artery disease)    a. 07/2011 NSTEMI/Cath: RCA 99%m, otw nl cors, EF 50%, inf hk -> RCA stented with 3.0x14mm Promus DES   Depression    Hyperlipidemia    Hypertension    Morgagni hernia    a. Discovered incidentally on CT 07/2011 ->f/u with Dr. Lindie Spruce   Myocardial infarction [facility])    Obesity    Sinus bradycardia    a. Nocturnal, asymptomatic   Sun allergy    Tobacco abuse    a. quit 07/2011     Past Surgical History:  Procedure Laterality Date   CARDIAC CATHETERIZATION  08/10/2011   left heart, with angiogram; DES mid RCA   IR RADIOLOGIST EVAL & MGMT  05/05/2021   LEFT HEART CATHETERIZATION WITH CORONARY ANGIOGRAM N/A 08/10/2011   Procedure: LEFT HEART CATHETERIZATION WITH CORONARY ANGIOGRAM;  Surgeon: Kathleene Hazel, MD;  Location: [facility] CATH LAB;  Service: Cardiovascular;  Laterality: N/A;   TONSILLECTOMY      Allergies: Wound dressing adhesive  Medications: Prior to Admission medications   Medication Sig Start Date End Date Taking? Authorizing Provider  acetaminophen (TYLENOL) 500 MG tablet Take 500 mg by mouth every 6 (six) hours as needed for moderate pain or fever.   Yes [provider]  ALPRAZolam Prudy Feeler) 1 MG tablet Take 1 tablet (1 mg total) by mouth 2 (two) times daily as needed for anxiety. Patient taking differently: Take 0.5-1 mg by mouth 2 (two) times daily as needed for anxiety. 03/10/21  Yes BaxleyLuanna Cole, MD  aspirin 81 MG tablet Take 81 mg by mouth daily.   Yes [provider]  cetirizine (ZYRTEC) 10 MG tablet Take 10 mg by mouth daily as needed for allergies.   Yes [provider]  FLUoxetine (PROZAC) 40 MG capsule Take 1 capsule (40 mg total) by mouth daily. 03/10/21  Yes Baxley, Luanna Cole, MD  lisinopril (ZESTRIL) 10 MG tablet Take 1 tablet (10 mg total) by mouth daily. 06/17/20  Yes Rollene Rotunda, MD  Multiple Vitamin (MULTIVITAMIN) capsule  Take 1 capsule by mouth daily.   Yes [provider]  nitroGLYCERIN (NITROSTAT) 0.4 MG SL tablet Place 1 tablet (0.4 mg total) under the tongue every 5 (five) minutes as needed for chest pain. 06/17/20  Yes Rollene Rotunda, MD  omeprazole (PRILOSEC) 20 MG capsule Take 1 capsule by mouth once daily 03/09/21  Yes Baxley, Luanna Cole, MD  rosuvastatin (CRESTOR) 40 MG tablet Take 1 tablet by mouth once daily 04/24/21  Yes Hochrein, Fayrene Fearing, MD  Sodium Chloride Flush (NORMAL SALINE FLUSH) 0.9  % SOLN Use 3 to 5 mls to flush intracatheterly 2 times daily 04/30/21     Sodium Chloride Flush (NORMAL SALINE FLUSH) 0.9 % SOLN Use as directed 04/30/21   Suttle, Thressa Sheller, MD     Family History  Problem Relation Age of Onset   Coronary artery disease Mother 7        stent   Heart attack Mother    Heart disease Mother    Hyperlipidemia Mother    Obesity Mother    Colon cancer Maternal Uncle    Arrhythmia Father    Depression Father    Coronary artery disease Maternal Aunt 33       (Mother's twin sister)   Lung cancer Maternal Aunt    Coronary artery disease Maternal Uncle 46   Heart attack Maternal Aunt        mother's twin   Heart attack Maternal Uncle    Breast cancer Maternal Grandmother    Cancer Neg Hx    Early death Neg Hx    Hypertension Neg Hx    Kidney disease Neg Hx    Stroke Neg Hx    Alcohol abuse Neg Hx    Diabetes Neg Hx    Drug abuse Neg Hx     Social History   Socioeconomic History   Marital status: Significant Other    Spouse name: Not on file   Number of children: 0   Years of education: Not on file   Highest education level: Not on file  Occupational History   Occupation: Realtor  Tobacco Use   Smoking status: Former    Packs/day: 1.00    Years: 18.00    Pack years: 18.00    Types: Cigarettes    Quit date: 08/09/2011    Years since quitting: 9.7   Smokeless tobacco: Never   Tobacco comments:    2013  Vaping Use   Vaping Use: Never used  Substance and Sexual Activity   Alcohol use: Yes    Alcohol/week: 4.0 standard drinks    Types: 4 Glasses of wine per week    Comment: occassional 4 times aweek    Drug use: No   Sexual activity: Yes    Birth control/protection: None, Pill  Other Topics Concern   Not on file  Social History Narrative   Lives alone.  Occasional EtOH.  Works as a Veterinary surgeon.   Social Determinants of Health   Financial Resource Strain: Not on file  Food Insecurity: Not on file  Transportation Needs: Not on file   Physical Activity: Not on file  Stress: Not on file  Social Connections: Not on file    Review of Systems: A 12 point ROS discussed and pertinent positives are indicated in the HPI above.  All other systems are negative.  Review of Systems  Constitutional:  Positive for appetite change and fatigue.  Respiratory:  Positive for shortness of breath. Negative for cough.   Cardiovascular:  Positive  for chest pain. Negative for leg swelling.       Chest wall pain related to fluid collection   Gastrointestinal:  Positive for nausea. Negative for diarrhea and vomiting.  Neurological:  Positive for headaches. Negative for dizziness.   Vital Signs: BP 122/78    Pulse 96    Temp 98.4 F (36.9 C) (Oral)    Resp (!) 26    Ht 5\' 4"  (1.626 m)    Wt (!) 300 lb 0.7 oz (136.1 kg)    SpO2 100%    BMI 51.50 kg/m   Physical Exam Constitutional:      General: She is not in acute distress.    Appearance: She is obese.  HENT:     Mouth/Throat:     Mouth: Mucous membranes are moist.     Pharynx: Oropharynx is clear.  Cardiovascular:     Rate and Rhythm: Normal rate and regular rhythm.     Pulses: Normal pulses.     Heart sounds: Normal heart sounds.  Pulmonary:     Effort: Pulmonary effort is normal.  Abdominal:     General: Bowel sounds are normal.     Palpations: Abdomen is soft.     Tenderness: There is no abdominal tenderness.  Skin:    General: Skin is warm and dry.  Neurological:     Mental Status: She is alert and oriented to person, place, and time.    Imaging: DG Chest 2 View  Addendum Date: 05/16/2021   ADDENDUM REPORT: 05/16/2021 14:09 ADDENDUM: Critical Value/emergent results were called by telephone at the time of interpretation on 05/16/2021 at 2:08 pm to provider [facility] , who verbally acknowledged these results. Electronically Signed   By: Signa Kellaylor  Stroud M.D.   On: 05/16/2021 14:09   Result Date: 05/16/2021 CLINICAL DATA:  Evaluate for pneumonia. Cough for 2 weeks.  Complications status post hernia repair EXAM: CHEST - 2 VIEW COMPARISON:  04/11/2021 FINDINGS: Stable heart size. No pleural effusion or interstitial edema. Large mass in the region of the right CP angle is identified with air-fluid level measuring 10.6 x 11.1 cm. The left lung appears clear. No acute airspace opacities identified. IMPRESSION: Interval reaccumulation of right lower chest fluid collection now with air-fluid level. Recommend further evaluation with repeat CT of the chest with contrast material and/or referral to thoracic surgery or interventional radiology. Electronically Signed: By: Signa Kellaylor  Stroud M.D. On: 05/16/2021 13:35   CT Chest W Contrast  Result Date: 05/16/2021 CLINICAL DATA:  Lung/mediastinal abscess. Chest hernia repair November 30th. Recent subdiaphragmatic drain placement. EXAM: CT CHEST WITH CONTRAST TECHNIQUE: Multidetector CT imaging of the chest was performed during intravenous contrast administration. RADIATION DOSE REDUCTION: This exam was performed according to the departmental dose-optimization program which includes automated exposure control, adjustment of the mA and/or kV according to patient size and/or use of iterative reconstruction technique. CONTRAST:  75mL OMNIPAQUE IOHEXOL 350 MG/ML SOLN COMPARISON:  Radiograph earlier today. Lung bases from abdominal CT 05/05/2021, chest CT 04/11/2021 FINDINGS: Cardiovascular: The heart is normal in size. There are coronary artery calcifications. Slight leftward cardiac deviation due to right mediastinal fluid collection. Thoracic aorta is normal in caliber. Mediastinum/Nodes: Right paramediastinal fluid collection, interval removal of drainage catheter from prior abdominal CT. Collection measures 10.1 x 10.4 x 8.2 cm (TR by AP by CC), measured on series 3 image 100 on series 5 image 87. Air-fluid level, approximately 60% fluid. There is surrounding fat stranding and inflammatory change. This appears to be supradiaphragmatic in  location. Small amount of non organized fluid tracks anterior inferiorly. There are small adjacent epicardial lymph nodes. Small mediastinal lymph nodes are not enlarged by size criteria. No hilar adenopathy. No esophageal wall thickening. Lungs/Pleura: Compressive atelectasis in the anterior right lung related to anterior mediastinal collection. The lungs are otherwise clear. No pleural effusion or pneumothorax. No pulmonary nodule. Trachea and central bronchi are patent. Upper Abdomen: Stranding and small amount of ill-defined fluid in the anterior upper abdomen anterior to the liver. This appears similar to abdominal CT 11 days ago. Hepatic steatosis. Musculoskeletal: There are no acute or suspicious osseous abnormalities. No subcutaneous fluid collection at site of drain. IMPRESSION: 1. Reaccumulation of right paramediastinal air fluid collection after removal of drainage catheter. Collection measures 10.1 x 10.4 x 8.2 cm, with surrounding fat stranding and inflammatory change. This appears to be supradiaphragmatic in location. 2. Stranding and small amount of ill-defined fluid in the anterior upper abdomen anterior to the liver, similar to abdominal CT 11 days ago. 3. Aortic atherosclerosis and coronary artery calcifications Electronically Signed   By: Narda RutherfordMelanie  Sanford M.D.   On: 05/16/2021 21:20   CT ABDOMEN PELVIS W CONTRAST  Result Date: 05/05/2021 CLINICAL DATA:  46 year old woman with right chest fluid collection status post hernia repair. CT-guided IR drain placement on 04/30/2021. She returns to IR clinic for follow-up. EXAM: CT ABDOMEN AND PELVIS WITH CONTRAST TECHNIQUE: Multidetector CT imaging of the abdomen and pelvis was performed using the standard protocol following bolus administration of intravenous contrast. RADIATION DOSE REDUCTION: This exam was performed according to the departmental dose-optimization program which includes automated exposure control, adjustment of the mA and/or kV  according to patient size and/or use of iterative reconstruction technique. CONTRAST:  100mL ISOVUE-300 IOPAMIDOL (ISOVUE-300) INJECTION 61% COMPARISON:  CT chest without contrast 04/11/2021 FINDINGS: Lower chest: Significant interval improvement of right lower chest fluid collection. Minimal fluid remains within this region. The pigtail drain is in appropriate position. Visualized lung bases otherwise clear. Hepatobiliary: No focal liver abnormality is seen. No gallstones, gallbladder wall thickening, or biliary dilatation. Pancreas: Unremarkable. No pancreatic ductal dilatation or surrounding inflammatory changes. Spleen: Normal in size without focal abnormality. Adrenals/Urinary Tract: Adrenal glands are unremarkable. Kidneys are normal, without renal calculi, focal lesion, or hydronephrosis. Bladder is unremarkable. Stomach/Bowel: Sigmoid colon diverticulosis without evidence of acute diverticulitis. No bowel dilatation. Normal appendix. Vascular/Lymphatic: No enlarged abdominal or pelvic lymph nodes. Atherosclerotic calcifications seen throughout the abdominal aorta and visualized branches. Reproductive: Uterus and bilateral adnexa are unremarkable. Other: No abdominal wall hernia or abnormality. No abdominopelvic ascites. Musculoskeletal: Moderate anterior wedging of the L1 vertebral body is unchanged from prior exam. IMPRESSION: Significant interval improvement of right lower chest fluid collection with minimal fluid still remaining. Electronically Signed   By: Acquanetta BellingFarhaan  Mir M.D.   On: 05/05/2021 15:37   DG Sinus/Fist Tube Chk-Non GI  Result Date: 05/05/2021 INDICATION: 46 year old woman with right chest pigtail drain presents to IR for fluoroscopic drain check and possible drain removal. EXAM: Fluoroscopic abscessogram MEDICATIONS: None ANESTHESIA/SEDATION: None COMPLICATIONS: None immediate. PROCEDURE: Informed written consent was obtained from the patient after a thorough discussion of the procedural  risks, benefits and alternatives. All questions were addressed. A timeout was performed prior to the initiation of the procedure. Scout image demonstrates the right chest drain in appropriate position. Contrast administered through the drain under fluoroscopy showed minimal residual cavity with no fistulous communication. The drain was cut and removed without difficulty. The insertion site was covered with sterile dressing. IMPRESSION:  Right chest drain injection shows minimal residual cavity without fistulous communication. Drain removed. Electronically Signed   By: Acquanetta Belling M.D.   On: 05/05/2021 15:24   DG CHEST PORT 1 VIEW  Result Date: 05/17/2021 CLINICAL DATA:  46 year old female with shortness of breath. EXAM: PORTABLE CHEST 1 VIEW COMPARISON:  Chest CT 05/16/2021 and earlier. FINDINGS: Portable AP semi upright view at 0605 hours. Air in fluid containing mass along the right cardiophrenic angle as seen by CT. Stable lung volumes and ventilation from yesterday. Visualized tracheal air column is within normal limits. No pneumothorax. No pleural effusion is evident. Paucity of bowel gas. No acute osseous abnormality identified. IMPRESSION: 1. Recurrent right cardiophrenic air-fluid collection stable from CT yesterday. 2. No new cardiopulmonary abnormality. Electronically Signed   By: Odessa Fleming M.D.   On: 05/17/2021 06:42   CT IMAGE GUIDED DRAINAGE BY PERCUTANEOUS CATHETER  Result Date: 04/30/2021 INDICATION: 46 year old female with history of right diaphragmatic hernia repair with right sub pulmonic postoperative fluid collection. EXAM: CT IMAGE GUIDED DRAINAGE BY PERCUTANEOUS CATHETER COMPARISON:  Chest CT from 04/11/2021 MEDICATIONS: The patient is currently admitted to the hospital and receiving intravenous antibiotics. The antibiotics were administered within an appropriate time frame prior to the initiation of the procedure. ANESTHESIA/SEDATION: Moderate (conscious) sedation was employed during this  procedure. A total of Versed 4 mg and Fentanyl 200 mcg was administered intravenously. Moderate Sedation Time: 14 minutes. The patient's level of consciousness and vital signs were monitored continuously by radiology nursing throughout the procedure under my direct supervision. CONTRAST:  None COMPLICATIONS: None immediate. PROCEDURE: Informed written consent was obtained from the patient after a discussion of the risks, benefits and alternatives to treatment. The patient was placed supine on the CT gantry and a pre procedural CT was performed re-demonstrating the known abscess/fluid collection within the right sub pulmonic region. The procedure was planned. A timeout was performed prior to the initiation of the procedure. The right anterior lower thorax was prepped and draped in the usual sterile fashion. The overlying soft tissues were anesthetized with 1% lidocaine with epinephrine. Appropriate trajectory was planned with the use of a 22 gauge spinal needle. An 18 gauge trocar needle was advanced into the collection and a short Amplatz super stiff wire was coiled within the collection. Appropriate positioning was confirmed with a limited CT scan. The tract was serially dilated allowing placement of a 10.2 [place] all-purpose drainage catheter. Appropriate positioning was confirmed with a limited postprocedural CT scan. A total of 750 ml of thin, dark red fluid was aspirated. The tube was connected to a drainage bag and sutured in place. A dressing was placed. The patient tolerated the procedure well without immediate post procedural complication. IMPRESSION: Successful CT guided placement of a 10.2 [place] all purpose drain catheter into the right sub pulmonic fluid collection with aspiration of 750 mL of dark serosanguineous fluid. Samples were sent to the laboratory. RADIATION DOSE REDUCTION: This exam was performed according to the departmental dose-optimization program which includes automated exposure control,  adjustment of the mA and/or kV according to patient size and/or use of iterative reconstruction technique. Marliss Coots, MD [facility] Radiology Electronically Signed   By: Marliss Coots M.D.   On: 04/30/2021 14:54   IR Radiologist Eval & Mgmt  Result Date: 05/05/2021 Please refer to notes tab for details about interventional procedure. (Op Note)   Labs:  CBC: Recent Labs    04/30/21 0835 05/16/21 1224 05/16/21 1521 05/17/21 0420  WBC 10.8* 16.1* 13.9* 15.2*  HGB 15.5* 14.6 14.5 13.1  HCT 46.8* 43.0 44.5 40.3  PLT 381 482* 479* 436*    COAGS: Recent Labs    02/21/21 1017 04/30/21 0835  INR 1.0 1.0  APTT 28  --     BMP: Recent Labs    07/30/20 0944 07/30/20 1117 02/24/21 1645 02/25/21 0436 05/16/21 1224 05/16/21 1521 05/17/21 0420  NA 140   < >  --  138 134* 134* 131*  K 4.9   < >  --  3.8 4.1 4.0 3.6  CL 104   < >  --  102 99 97* 97*  CO2 29   < >  --  27 22 25  21*  GLUCOSE 95   < >  --  120* 108* 153* 116*  BUN 11   < >  --  7 10 8 7   CALCIUM 9.0   < >  --  8.8* 9.6 9.2 8.8*  CREATININE 0.74   < > 0.74 0.84 0.79 0.71 0.69  GFRNONAA 97   < > >60 >60  --  >60 >60  GFRAA 113  --   --   --   --   --   --    < > = values in this interval not displayed.    LIVER FUNCTION TESTS: Recent Labs    06/11/20 1113 07/30/20 0944 11/04/20 1110 02/21/21 1017 05/16/21 1224 05/16/21 1521  BILITOT 0.5   < > 0.2 0.7 0.7 0.7  AST 93*   < > 44* 58* 23 27  ALT 75*   < > 44* 58* 24 28  ALKPHOS 113  --  91 72  --  96  PROT 7.2   < > 7.1 6.7 7.4 7.7  ALBUMIN 4.3  --  4.2 3.3*  --  3.0*   < > = values in this interval not displayed.    TUMOR MARKERS: No results for input(s): AFPTM, CEA, CA199, CHROMGRNA in the last 8760 hours.  Assessment and Plan:  History of diaphragmatic hernia repair with post-op right hemithorax fluid collection: Shaterica A. 5, 46 year old female, is scheduled today for an image-guided right  chest tube placement.   Risks and benefits discussed with the patient including bleeding, infection, damage to adjacent structures and sepsis.  All of the patient's questions were answered, patient is agreeable to proceed. She has been NPO.   Consent signed and in IR  Thank you for this interesting consult.  I greatly enjoyed meeting Rhonda Colon and look forward to participating in their care.  A copy of this report was sent to the requesting provider on this date.  Electronically Signed: [place], AGACNP-BC [phone] 05/17/2021, 9:17 AM   I spent a total of 20 Minutes    in face to face in clinical consultation, greater than 50% of which was counseling/coordinating care for image-guided right chest tube placement.

## 2021-05-17 NOTE — H&P (Addendum)
[address]       [place] [address]             [phone]      Subjective:   Rhonda Colon is a 46 yo female with known history of HTN, HLD, Morbid Obesity, and Depression.  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  She was evaluated by Dr. Cliffton Asters who ultimately offered surgical repair.  This was done via a Robotic Assisted approach on 02/24/2021.  The patient initially did well post operatively.  However on follow up with Dr. Cliffton Asters on 04/04/2021 at which time she complained increased pain in her epigastrium, which developed about 2 weeks prior to presentation.  She also noticed some blood in her rectum which had resolved.  CXR obtained at that time showed recurrence of previously repaired hernia.  It was felt CT scan should be obtained and showed no recurrence of her hernia.  However there was a large fluid collection felt to be a seroma in her hernia space.  It was recommended she undergo a CT guided drain placement to treat the collection.  This was placed on 04/30/2021 with initial removal of 1L of fluid which improved her symptoms.  She followed up with Interventional radiology on 2/6 at which time her drain output was minimal.  Fluoroscopy of the drain showed minimal residual activity and her drain was removed.  The patient developed fever and increased work of breathing.  She presented to her PCP for workup on 2/17 and after speaking with Dr. Cliffton Asters she was referred to the Emergency Department for evaluation.  She was given an IM dose of Rocephin at her family doctors office prior to arrival.  In the ED the patient states she was experiencing headache, fatigue, and chills.  CXR from her PCP office showed a large fluid collection.  Lab work shows a mild leukocytosis with WBC of 16K.  Cardiothoracic consultation was requested and felt the patient should be admitted for broad spectrum ABX and repeat IR placement to drain collection.  She was  evaluated by Dr. Dorris Fetch in the ED.  The patient admitted to a several days of fever, chills, diaphoresis, headache, myalgias, shortness of breath with cough, and mild nausea/vomiting.  Patient Active Problem List   Diagnosis Date Noted   Pleural effusion 05/17/2021   Sepsis due to intrathoracic abscess (HCC) 05/16/2021   Diaphragmatic hernia 02/24/2021   Vulvovaginitis 01/31/2021   Dyslipidemia 06/16/2020   Other fatigue 08/31/2017   Shortness of breath on exertion 08/31/2017   Essential hypertension 08/31/2017   Depression with anxiety 12/10/2015   Urticaria, solar 08/08/2013   CAD (coronary artery disease) 08/28/2011   Chronic low back pain 07/27/2011   Past Medical History:  Diagnosis Date   CAD (coronary artery disease)    a. 07/2011 NSTEMI/Cath: RCA 99%m, otw nl cors, EF 50%, inf hk -> RCA stented with 3.0x30mm Promus DES   Depression    Hyperlipidemia    Hypertension    Morgagni hernia    a. Discovered incidentally on CT 07/2011 ->f/u with Dr. Lindie Spruce   Myocardial infarction [facility])    Obesity    Sinus bradycardia    a. Nocturnal, asymptomatic   Sun allergy    Tobacco abuse    a. quit 07/2011    Past Surgical History:  Procedure Laterality Date   CARDIAC CATHETERIZATION  08/10/2011   left heart, with angiogram; DES mid RCA   IR  RADIOLOGIST EVAL & MGMT  05/05/2021   LEFT HEART CATHETERIZATION WITH CORONARY ANGIOGRAM N/A 08/10/2011   Procedure: LEFT HEART CATHETERIZATION WITH CORONARY ANGIOGRAM;  Surgeon: Kathleene Hazel, MD;  Location: [facility] CATH LAB;  Service: Cardiovascular;  Laterality: N/A;   TONSILLECTOMY      (Not in a hospital admission)  Allergies  Allergen Reactions   Wound Dressing Adhesive Dermatitis and Rash    Social History   Tobacco Use   Smoking status: Former    Packs/day: 1.00    Years: 18.00    Pack years: 18.00    Types: Cigarettes    Quit date: 08/09/2011    Years since quitting: 9.7   Smokeless tobacco: Never   Tobacco comments:     2013  Substance Use Topics   Alcohol use: Yes    Alcohol/week: 4.0 standard drinks    Types: 4 Glasses of wine per week    Comment: occassional 4 times aweek     Family History  Problem Relation Age of Onset   Coronary artery disease Mother 27        stent   Heart attack Mother    Heart disease Mother    Hyperlipidemia Mother    Obesity Mother    Colon cancer Maternal Uncle    Arrhythmia Father    Depression Father    Coronary artery disease Maternal Aunt 43       (Mother's twin sister)   Lung cancer Maternal Aunt    Coronary artery disease Maternal Uncle 46   Heart attack Maternal Aunt        mother's twin   Heart attack Maternal Uncle    Breast cancer Maternal Grandmother    Cancer Neg Hx    Early death Neg Hx    Hypertension Neg Hx    Kidney disease Neg Hx    Stroke Neg Hx    Alcohol abuse Neg Hx    Diabetes Neg Hx    Drug abuse Neg Hx      Objective:   Patient Vitals for the past 8 hrs:  BP Temp Temp src Pulse Resp SpO2  05/17/21 0915 129/76 -- -- 96 (!) 33 93 %  05/17/21 0854 122/78 -- -- -- -- --  05/17/21 0745 127/73 -- -- 96 (!) 26 100 %  05/17/21 0724 132/75 98.4 F (36.9 C) Oral 100 (!) 23 93 %  05/17/21 0545 122/72 -- -- 86 19 97 %  05/17/21 0532 -- 99 F (37.2 C) Oral -- -- --  05/17/21 0500 113/76 -- -- 88 15 95 %  05/17/21 0215 126/68 -- -- (!) 109 (!) 24 95 %   I/O last 3 completed shifts: In: 482.4 [IV Piggyback:482.4] Out: -  No intake/output data recorded.  BP 129/76    Pulse 96    Temp 98.4 F (36.9 C) (Oral)    Resp (!) 33    Ht 5\' 4"  (1.626 m)    Wt (!) 136.1 kg    SpO2 93%    BMI 51.50 kg/m  General appearance: alert, cooperative, and no distress Head: Normocephalic, without obvious abnormality, atraumatic Lungs: diminished breath sounds bibasilar Heart: regular rate and rhythm Abdomen: soft, non-tender; bowel sounds normal; no masses,  no organomegaly Extremities: extremities normal, atraumatic, no cyanosis or edema Neurologic:  Grossly normal  CT Scan:  IMPRESSION: 1. Reaccumulation of right paramediastinal air fluid collection after removal of drainage catheter. Collection measures 10.1 x 10.4 x 8.2 cm, with surrounding fat stranding and inflammatory  change. This appears to be supradiaphragmatic in location. 2. Stranding and small amount of ill-defined fluid in the anterior upper abdomen anterior to the liver, similar to abdominal CT 11 days ago. 3. Aortic atherosclerosis and coronary artery calcifications    Electronically Signed   By: Narda Rutherford M.D.   On: 05/16/2021 21:20  Assessment:   Principal Problem:   Sepsis due to intrathoracic abscess [facility]) Active Problems:   CAD (coronary artery disease)   Depression with anxiety   Essential hypertension   Dyslipidemia   Pleural effusion   Plan:    S/P Morgagni Hernia- S/P Repair 11/28, developed post operative seroma in January and underwent IR catheter placement, this was removed on 2/6 due to scant output... She now presents to the ED with sepsis and re-accumulation of fluid collection.  IR has been consulted to place another drain with culture of fluid removed.  She is being started on Vancomycin and Zosyn, blood cultures have been obtained 2. HTN- Zestril 3. HLD-Crestor 4. Depression- on Prozac 5. Admit to inpatient for sepsis treatment  Lowella Dandy, PA-C

## 2021-05-17 NOTE — Assessment & Plan Note (Signed)
Continue rosuvastatin 40mg daily  

## 2021-05-17 NOTE — Progress Notes (Signed)
°   °  [address]       [place] [address]             [phone]       Contacted yesterday by Dr. Lenord Fellers about pt's symptoms, and CXR findings.  Recommended that patient be admitted, and obtain another image guided drain with cultures, and antibiotics.  If space is infected, pt will require removal of mesh.  Will continue to follow.  Bhavya Grand Keane Scrape

## 2021-05-17 NOTE — ED Notes (Signed)
Pt transported to IR via stretcher at this time.   

## 2021-05-17 NOTE — Procedures (Addendum)
Interventional Radiology Procedure:   Indications: Recurrent right chest abscess in old hernia space.  Procedure: CT guided right chest drain placement  Findings: Placed 12 Fr and removed 300 ml of foul smelling brown fluid and large amount of gas.  Collection was markedly smaller after drain placement but small amount of residual fluid and gas.  Minimal output with PleurEvac and switched to suction bulb.    Complications: No immediate complications noted.     EBL: Minimal  Plan: Send fluid for culture.  Fluid is thick and will need routine saline flushes.     Crystalyn Delia R. Anselm Pancoast, MD  Pager: [phone]

## 2021-05-17 NOTE — ED Notes (Signed)
Pt remains in IR. Pt to be transported to room from IR. Pt family member taking pt's belongings to ready room upstairs.

## 2021-05-17 NOTE — ED Notes (Signed)
Pt reports increase right thoracic pain unrelieved by morphine and increase shob. PT 02 91%, pt placed on 4 L [place]. MD Julian Reil made aware. CXR to be repeated.

## 2021-05-17 NOTE — ED Notes (Signed)
Pt ambulated to BR with steady gait.

## 2021-05-17 NOTE — Assessment & Plan Note (Signed)
Continue lisinopril 10 mg daily. 

## 2021-05-17 NOTE — Progress Notes (Signed)
PROGRESS NOTE  Rhonda Colon XBD:[id number] DOB: Jan 07, 1976 DOA: 05/16/2021 PCP: Margaree Mackintosh, MD  HPI/Recap of past 24 hours: Rhonda Colon is a 46 y.o. female with medical history significant for CAD s/p DES to RCA in 2013, HTN, HLD, depression, obesity, Morgagni hernia s/p diaphragmatic hernia repair 02/24/2021 who presented to the ED for evaluation of fevers, chills, myalgias.  Patient underwent diaphragmatic hernia repair 02/24/2021 by cardiothoracic surgery, Dr. Cliffton Asters.  She was found to have a complicated large fluid collection in the lower right hemithorax on 04/11/2021.  She underwent IR guided drain placement on 04/30/2020 which drained 750 cc dark red fluid.  Fluid cultures collected at that time grew rare cutibacterium avidum.  Repeat CT imaging 05/05/2021 showed significant interval improvement of right lower chest fluid collection with minimal fluid still remaining and chest drain was subsequently removed.  Patient saw her PCP today with several days of fever, chills, diaphoresis, headache, myalgias.  She was given 1 g IM ceftriaxone in office.  Chest x-ray was obtained which showed reaccumulation of fluid in the right lower chest now with air-fluid level.  Her PCP discussed with CT surgery, Dr. Cliffton Asters, who recommended she present to the ED for expedited evaluation and admission.  Patient states that she has also been experiencing some shortness of breath with occasional nonproductive cough.  She has had some nausea with nonbloody bloody emesis.  Work-up revealed CT of the chest that shows a loculated air-fluid collection in the inferior right chest, personally reviewed.  Seen by cardiothoracic surgery, [facility] will admit to their service.  She is currently on broad-spectrum IV antibiotics Zosyn and IV vancomycin.  Gentle IV fluid hydration started LR at 50 cc/h x 2 days.  Blood cultures obtained on 05/16/2021, negative to date.    Assessment/Plan: Principal Problem:   Sepsis due  to intrathoracic abscess (HCC) Active Problems:   CAD (coronary artery disease)   Depression with anxiety   Essential hypertension   Dyslipidemia   Pleural effusion  Sepsis due to intrathoracic abscess (HCC)- (present on admission) Patient presenting with leukocytosis, tachypnea, and tachycardia.  Found to have reaccumulation of right lower chest fluid collection.  Previously had CT-guided drain placed on 2/1 with good output which was ultimately removed on 2/6.  Has been having persistent fevers at home, chills, diaphoresis over the last several days. -CT chest shows 10.1 x 10.4 x 8.2 cm right paramediastinal air-fluid collection -Seen by cardiothoracic surgery, Dr. Dorris Fetch, [facility] will admit patient and take over the care on 05/17/2021.  Currently on IV Zosyn and IV vancomycin LR started at 50 cc/h. Monitor fever curve and WBC. Follow cultures, narrow down antibiotics according to ID and sensitivities.  Mild anion gap metabolic acidosis Serum bicarb 21, anion gap 13 Continue IV fluid hydration. Repeat BMP in the morning.  CAD (coronary artery disease)- (present on admission) S/p DES to RCA in 2013.  Stable, denies any chest pain.  Continue aspirin and statin.   Essential hypertension- (present on admission) Continue lisinopril 10 mg daily.   Dyslipidemia- (present on admission) Continue rosuvastatin 40 mg daily.   Depression with anxiety- (present on admission) Continue home Prozac 40 mg daily and Xanax 0.5-1 mg twice daily as needed.   DVT prophylaxis: SCDs Start: 05/16/21 2253 Code Status: Full code, confirmed with patient Family Communication: Family member at bedside.   Disposition Plan: From home, dispo pending clinical progress Consults called: Cardiothoracic surgery, IR   Objective: Vitals:   05/17/21 1310 05/17/21 1315 05/17/21  1320 05/17/21 1325  BP: 123/76 118/79 122/77 120/73  Pulse: 95 96 100 (!) 102  Resp: 20 (!) 22 20 (!) 24  Temp:      TempSrc:       SpO2: 96% 96% 94% 94%  Weight:      Height:        Intake/Output Summary (Last 24 hours) at 05/17/2021 1358 Last data filed at 05/17/2021 1330 Gross per 24 hour  Intake 632.4 ml  Output 300 ml  Net 332.4 ml   Filed Weights   05/16/21 1611  Weight: (!) 136.1 kg    Exam:  General: 46 y.o. year-old female well developed well nourished in no acute distress.  Alert and oriented x3. Cardiovascular: Regular rate and rhythm with no rubs or gallops.  No thyromegaly or JVD noted.   Respiratory: Clear to auscultation with no wheezes or rales. Good inspiratory effort. Abdomen: Soft nontender nondistended with normal bowel sounds x4 quadrants. Musculoskeletal: No lower extremity edema bilaterally.   Skin: No ulcerative lesions noted or rashes, Psychiatry: Mood is appropriate for condition and setting   Data Reviewed: CBC: Recent Labs  Lab 05/16/21 1224 05/16/21 1521 05/17/21 0420  WBC 16.1* 13.9* 15.2*  NEUTROABS 11,012*  --   --   HGB 14.6 14.5 13.1  HCT 43.0 44.5 40.3  MCV 87.6 90.4 90.4  PLT 482* 479* 436*   Basic Metabolic Panel: Recent Labs  Lab 05/16/21 1224 05/16/21 1521 05/17/21 0420  NA 134* 134* 131*  K 4.1 4.0 3.6  CL 99 97* 97*  CO2 22 25 21*  GLUCOSE 108* 153* 116*  BUN 10 8 7   CREATININE 0.79 0.71 0.69  CALCIUM 9.6 9.2 8.8*   GFR: Estimated Creatinine Clearance: 119.8 mL/min (by C-G formula based on SCr of 0.69 mg/dL). Liver Function Tests: Recent Labs  Lab 05/16/21 1224 05/16/21 1521  AST 23 27  ALT 24 28  ALKPHOS  --  96  BILITOT 0.7 0.7  PROT 7.4 7.7  ALBUMIN  --  3.0*   Recent Labs  Lab 05/16/21 1521  LIPASE 27   No results for input(s): AMMONIA in the last 168 hours. Coagulation Profile: No results for input(s): INR, PROTIME in the last 168 hours. Cardiac Enzymes: No results for input(s): CKTOTAL, CKMB, CKMBINDEX, TROPONINI in the last 168 hours. BNP (last 3 results) No results for input(s): PROBNP in the last 8760  hours. HbA1C: No results for input(s): HGBA1C in the last 72 hours. CBG: No results for input(s): GLUCAP in the last 168 hours. Lipid Profile: No results for input(s): CHOL, HDL, LDLCALC, TRIG, CHOLHDL, LDLDIRECT in the last 72 hours. Thyroid Function Tests: No results for input(s): TSH, T4TOTAL, FREET4, T3FREE, THYROIDAB in the last 72 hours. Anemia Panel: No results for input(s): VITAMINB12, FOLATE, FERRITIN, TIBC, IRON, RETICCTPCT in the last 72 hours. Urine analysis:    Component Value Date/Time   COLORURINE AMBER (A) 05/16/2021 2019   APPEARANCEUR HAZY (A) 05/16/2021 2019   LABSPEC 1.024 05/16/2021 2019   PHURINE 5.0 05/16/2021 2019   GLUCOSEU NEGATIVE 05/16/2021 2019   HGBUR NEGATIVE 05/16/2021 2019   BILIRUBINUR NEGATIVE 05/16/2021 2019   BILIRUBINUR NEG 08/01/2020 1122   KETONESUR 20 (A) 05/16/2021 2019   PROTEINUR 30 (A) 05/16/2021 2019   UROBILINOGEN 0.2 08/01/2020 1122   UROBILINOGEN 0.2 08/09/2011 1457   NITRITE NEGATIVE 05/16/2021 2019   LEUKOCYTESUR NEGATIVE 05/16/2021 2019   Sepsis Labs: @LABRCNTIP (procalcitonin:4,lacticidven:4)  ) Recent Results (from the past 240 hour(s))  Blood culture (routine  x 2)     Status: None (Preliminary result)   Collection Time: 05/16/21  3:21 PM   Specimen: BLOOD  Result Value Ref Range Status   Specimen Description BLOOD SITE NOT SPECIFIED  Final   Special Requests   Final    BOTTLES DRAWN AEROBIC AND ANAEROBIC Blood Culture adequate volume   Culture   Final    NO GROWTH < 24 HOURS Performed at [facility], [address] [place], [place] [address]    Report Status PENDING  Incomplete  Blood culture (routine x 2)     Status: None (Preliminary result)   Collection Time: 05/16/21  8:09 PM   Specimen: BLOOD  Result Value Ref Range Status   Specimen Description BLOOD SITE NOT SPECIFIED  Final   Special Requests   Final    BOTTLES DRAWN AEROBIC AND ANAEROBIC Blood Culture adequate volume   Culture   Final    NO  GROWTH < 12 HOURS Performed at [facility], [address] [place], [place] [address]    Report Status PENDING  Incomplete  Resp Panel by RT-PCR (Flu A&B, Covid) Nasopharyngeal Swab     Status: None   Collection Time: 05/16/21 10:36 PM   Specimen: Nasopharyngeal Swab; Nasopharyngeal(NP) swabs in vial transport medium  Result Value Ref Range Status   SARS Coronavirus 2 by RT PCR NEGATIVE NEGATIVE Final    Comment: (NOTE) SARS-CoV-2 target nucleic acids are NOT DETECTED.  The SARS-CoV-2 RNA is generally detectable in upper respiratory specimens during the acute phase of infection. The lowest concentration of SARS-CoV-2 viral copies this assay can detect is 138 copies/mL. A negative result does not preclude SARS-Cov-2 infection and should not be used as the sole basis for treatment or other patient management decisions. A negative result may occur with  improper specimen collection/handling, submission of specimen other than nasopharyngeal swab, presence of viral mutation(s) within the areas targeted by this assay, and inadequate number of viral copies(<138 copies/mL). A negative result must be combined with clinical observations, patient history, and epidemiological information. The expected result is Negative.  Fact Sheet for Patients:  BloggerCourse.comhttps://www.fda.gov/media/152166/download  Fact Sheet for Healthcare Providers:  SeriousBroker.ithttps://www.fda.gov/media/152162/download  This test is no t yet approved or cleared by the [place] FDA and  has been authorized for detection and/or diagnosis of SARS-CoV-2 by FDA under an Emergency Use Authorization (EUA). This EUA will remain  in effect (meaning this test can be used) for the duration of the COVID-19 declaration under Section 564(b)(1) of the Act, 21 U.S.C.section 360bbb-3(b)(1), unless the authorization is terminated  or revoked sooner.       Influenza A by PCR NEGATIVE NEGATIVE Final   Influenza B by PCR NEGATIVE NEGATIVE Final     Comment: (NOTE) The Xpert Xpress SARS-CoV-2/FLU/RSV plus assay is intended as an aid in the diagnosis of influenza from Nasopharyngeal swab specimens and should not be used as a sole basis for treatment. Nasal washings and aspirates are unacceptable for Xpert Xpress SARS-CoV-2/FLU/RSV testing.  Fact Sheet for Patients: BloggerCourse.comhttps://www.fda.gov/media/152166/download  Fact Sheet for Healthcare Providers: SeriousBroker.ithttps://www.fda.gov/media/152162/download  This test is not yet approved or cleared by the [place] FDA and has been authorized for detection and/or diagnosis of SARS-CoV-2 by FDA under an Emergency Use Authorization (EUA). This EUA will remain in effect (meaning this test can be used) for the duration of the COVID-19 declaration under Section 564(b)(1) of the Act, 21 U.S.C. section 360bbb-3(b)(1), unless the authorization is terminated or revoked.  Performed at [facility], [address] [place], [place] [address]       Studies: CT Chest W Contrast  Result Date: 05/16/2021 CLINICAL DATA:  Lung/mediastinal abscess. Chest hernia repair November 30th. Recent subdiaphragmatic drain placement. EXAM: CT CHEST WITH CONTRAST TECHNIQUE: Multidetector CT imaging of the chest was performed during intravenous contrast administration. RADIATION DOSE REDUCTION: This exam was performed according to the departmental dose-optimization program which includes automated exposure control, adjustment of the mA and/or kV according to patient size and/or use of iterative reconstruction technique. CONTRAST:  75mL OMNIPAQUE IOHEXOL 350 MG/ML SOLN COMPARISON:  Radiograph earlier today. Lung bases from abdominal CT 05/05/2021, chest CT 04/11/2021 FINDINGS: Cardiovascular: The heart is normal in size. There are coronary artery calcifications. Slight leftward cardiac deviation due to right mediastinal fluid collection. Thoracic aorta is normal in caliber. Mediastinum/Nodes: Right paramediastinal fluid  collection, interval removal of drainage catheter from prior abdominal CT. Collection measures 10.1 x 10.4 x 8.2 cm (TR by AP by CC), measured on series 3 image 100 on series 5 image 87. Air-fluid level, approximately 60% fluid. There is surrounding fat stranding and inflammatory change. This appears to be supradiaphragmatic in location. Small amount of non organized fluid tracks anterior inferiorly. There are small adjacent epicardial lymph nodes. Small mediastinal lymph nodes are not enlarged by size criteria. No hilar adenopathy. No esophageal wall thickening. Lungs/Pleura: Compressive atelectasis in the anterior right lung related to anterior mediastinal collection. The lungs are otherwise clear. No pleural effusion or pneumothorax. No pulmonary nodule. Trachea and central bronchi are patent. Upper Abdomen: Stranding and small amount of ill-defined fluid in the anterior upper abdomen anterior to the liver. This appears similar to abdominal CT 11 days ago. Hepatic steatosis. Musculoskeletal: There are no acute or suspicious osseous abnormalities. No subcutaneous fluid collection at site of drain. IMPRESSION: 1. Reaccumulation of right paramediastinal air fluid collection after removal of drainage catheter. Collection measures 10.1 x 10.4 x 8.2 cm, with surrounding fat stranding and inflammatory change. This appears to be supradiaphragmatic in location. 2. Stranding and small amount of ill-defined fluid in the anterior upper abdomen anterior to the liver, similar to abdominal CT 11 days ago. 3. Aortic atherosclerosis and coronary artery calcifications Electronically Signed   By: Narda RutherfordMelanie  Sanford M.D.   On: 05/16/2021 21:20   DG CHEST PORT 1 VIEW  Result Date: 05/17/2021 CLINICAL DATA:  46 year old female with shortness of breath. EXAM: PORTABLE CHEST 1 VIEW COMPARISON:  Chest CT 05/16/2021 and earlier. FINDINGS: Portable AP semi upright view at 0605 hours. Air in fluid containing mass along the right  cardiophrenic angle as seen by CT. Stable lung volumes and ventilation from yesterday. Visualized tracheal air column is within normal limits. No pneumothorax. No pleural effusion is evident. Paucity of bowel gas. No acute osseous abnormality identified. IMPRESSION: 1. Recurrent right cardiophrenic air-fluid collection stable from CT yesterday. 2. No new cardiopulmonary abnormality. Electronically Signed   By: Odessa FlemingH  Selden Noteboom M.D.   On: 05/17/2021 06:42    Scheduled Meds:  [START ON 05/18/2021] aspirin EC  81 mg Oral Daily   diphenhydrAMINE       fentaNYL       FLUoxetine  40 mg Oral Daily   lidocaine       lidocaine       lisinopril  10 mg Oral Daily   midazolam       pantoprazole  40 mg Oral Daily   rosuvastatin  40 mg Oral QPM   senna-docusate  2 tablet Oral QHS    Continuous Infusions:  lactated ringers 75 mL/hr at 05/17/21 0852   piperacillin-tazobactam (ZOSYN)  IV Stopped (05/17/21 4656)   vancomycin Stopped (05/17/21 1010)     LOS: 1 day     Darlin Drop, MD [facility] Pager [phone]  If 7PM-7AM, please contact night-coverage [email or web address] Password [facility] 05/17/2021, 1:58 PM

## 2021-05-17 NOTE — Assessment & Plan Note (Signed)
S/p DES to RCA in 2013.  Stable, denies any chest pain.  Continue aspirin and statin.

## 2021-05-17 NOTE — Assessment & Plan Note (Signed)
Continue home Prozac 40 mg daily and Xanax 0.5-1 mg twice daily as needed.

## 2021-05-18 ENCOUNTER — Encounter: Payer: Self-pay | Admitting: Cardiology

## 2021-05-18 ENCOUNTER — Inpatient Hospital Stay (HOSPITAL_COMMUNITY): Payer: 59

## 2021-05-18 DIAGNOSIS — A419 Sepsis, unspecified organism: Secondary | ICD-10-CM | POA: Diagnosis not present

## 2021-05-18 LAB — CBC WITH DIFFERENTIAL/PLATELET
Abs Immature Granulocytes: 0.05 10*3/uL (ref 0.00–0.07)
Basophils Absolute: 0.1 10*3/uL (ref 0.0–0.1)
Basophils Relative: 1 %
Eosinophils Absolute: 0.4 10*3/uL (ref 0.0–0.5)
Eosinophils Relative: 4 %
HCT: 40 % (ref 36.0–46.0)
Hemoglobin: 12.8 g/dL (ref 12.0–15.0)
Immature Granulocytes: 1 %
Lymphocytes Relative: 26 %
Lymphs Abs: 2.9 10*3/uL (ref 0.7–4.0)
MCH: 29 pg (ref 26.0–34.0)
MCHC: 32 g/dL (ref 30.0–36.0)
MCV: 90.5 fL (ref 80.0–100.0)
Monocytes Absolute: 1.5 10*3/uL — ABNORMAL HIGH (ref 0.1–1.0)
Monocytes Relative: 14 %
Neutro Abs: 6.1 10*3/uL (ref 1.7–7.7)
Neutrophils Relative %: 54 %
Platelets: 421 10*3/uL — ABNORMAL HIGH (ref 150–400)
RBC: 4.42 MIL/uL (ref 3.87–5.11)
RDW: 12.9 % (ref 11.5–15.5)
WBC: 11 10*3/uL — ABNORMAL HIGH (ref 4.0–10.5)
nRBC: 0 % (ref 0.0–0.2)

## 2021-05-18 LAB — BASIC METABOLIC PANEL
Anion gap: 11 (ref 5–15)
BUN: 13 mg/dL (ref 6–20)
CO2: 25 mmol/L (ref 22–32)
Calcium: 8.7 mg/dL — ABNORMAL LOW (ref 8.9–10.3)
Chloride: 95 mmol/L — ABNORMAL LOW (ref 98–111)
Creatinine, Ser: 0.99 mg/dL (ref 0.44–1.00)
GFR, Estimated: >60 mL/min (ref >60)
Glucose, Bld: 125 mg/dL — ABNORMAL HIGH (ref 70–99)
Potassium: 3.6 mmol/L (ref 3.5–5.1)
Sodium: 131 mmol/L — ABNORMAL LOW (ref 135–145)

## 2021-05-18 LAB — VANCOMYCIN, TROUGH: Vancomycin Tr: 10 ug/mL — ABNORMAL LOW (ref 15–20)

## 2021-05-18 MED ORDER — MORPHINE SULFATE (PF) 2 MG/ML IV SOLN
2.0000 mg | INTRAVENOUS | Status: DC | PRN
  Administered 2021-05-18 – 2021-06-04 (×13): 2 mg via INTRAVENOUS
  Filled 2021-05-18 (×14): qty 1

## 2021-05-18 MED ORDER — VANCOMYCIN HCL 1250 MG/250ML IV SOLN
1250.0000 mg | Freq: Two times a day (BID) | INTRAVENOUS | Status: DC
  Administered 2021-05-18 – 2021-05-20 (×4): 1250 mg via INTRAVENOUS
  Filled 2021-05-18 (×5): qty 250

## 2021-05-18 MED ORDER — KETOROLAC TROMETHAMINE 15 MG/ML IJ SOLN
15.0000 mg | Freq: Once | INTRAMUSCULAR | Status: AC
  Administered 2021-05-18: 15 mg via INTRAVENOUS
  Filled 2021-05-18: qty 1

## 2021-05-18 NOTE — Progress Notes (Signed)
Referring Physician(s): Dr. Charlett Lango  Supervising Physician: Richarda Overlie  Patient Status:  [facility] - In-pt  Chief Complaint:  CT guided right chest drain 05/17/21 with Dr. Lowella Dandy  Subjective:  Pt sitting upright in bed watching TV. Family at bedside. She states she feels better but still having "sweats". She denies N/V and reports she ate a full breakfast with no difficulty. She denies pain at the site.  Allergies: Wound dressing adhesive  Medications: Prior to Admission medications   Medication Sig Start Date End Date Taking? Authorizing Provider  acetaminophen (TYLENOL) 500 MG tablet Take 500 mg by mouth every 6 (six) hours as needed for moderate pain or fever.   Yes [provider]  ALPRAZolam Prudy Feeler) 1 MG tablet Take 1 tablet (1 mg total) by mouth 2 (two) times daily as needed for anxiety. Patient taking differently: Take 0.5-1 mg by mouth 2 (two) times daily as needed for anxiety. 03/10/21  Yes BaxleyLuanna Cole, MD  aspirin 81 MG tablet Take 81 mg by mouth daily.   Yes [provider]  cetirizine (ZYRTEC) 10 MG tablet Take 10 mg by mouth daily as needed for allergies.   Yes [provider]  FLUoxetine (PROZAC) 40 MG capsule Take 1 capsule (40 mg total) by mouth daily. 03/10/21  Yes Baxley, Luanna Cole, MD  lisinopril (ZESTRIL) 10 MG tablet Take 1 tablet (10 mg total) by mouth daily. 06/17/20  Yes Rollene Rotunda, MD  Multiple Vitamin (MULTIVITAMIN) capsule Take 1 capsule by mouth daily.   Yes [provider]  nitroGLYCERIN (NITROSTAT) 0.4 MG SL tablet Place 1 tablet (0.4 mg total) under the tongue every 5 (five) minutes as needed for chest pain. 06/17/20  Yes Rollene Rotunda, MD  omeprazole (PRILOSEC) 20 MG capsule Take 1 capsule by mouth once daily 03/09/21  Yes Baxley, Luanna Cole, MD  rosuvastatin (CRESTOR) 40 MG tablet Take 1 tablet by mouth once daily 04/24/21  Yes Rollene Rotunda, MD  Sodium Chloride Flush (NORMAL SALINE FLUSH) 0.9 % SOLN Use 3  to 5 mls to flush intracatheterly 2 times daily 04/30/21     Sodium Chloride Flush (NORMAL SALINE FLUSH) 0.9 % SOLN Use as directed 04/30/21   Bennie Dallas, MD     Vital Signs: BP (!) 91/58 (BP Location: Right Arm)    Pulse 85    Temp (!) 97.5 F (36.4 C) (Oral)    Resp 18    Ht 5\' 4"  (1.626 m)    Wt (!) 300 lb 0.7 oz (136.1 kg)    SpO2 96%    BMI 51.50 kg/m   Physical Exam Constitutional:      Appearance: She is well-developed. She is not ill-appearing or diaphoretic.  HENT:     Head: Normocephalic and atraumatic.  Eyes:     Extraocular Movements: Extraocular movements intact.     Pupils: Pupils are equal, round, and reactive to light.  Cardiovascular:     Rate and Rhythm: Normal rate.  Pulmonary:     Effort: Pulmonary effort is normal. No respiratory distress.  Abdominal:     Comments: Medical right chest tube insertion site unremarkable with sutures/statlock in place. No redness, swelling, oozing or bleeding noted at site. ~10 cc tan, feculent OP in JP w/ 80cc documented in Epic. Flushes/aspirates easily. Dressing C/D/I  Neurological:     Mental Status: She is alert.    Imaging: DG Chest 2 View  Addendum Date: 05/16/2021   ADDENDUM REPORT: 05/16/2021 14:09 ADDENDUM: Critical Value/emergent  results were called by telephone at the time of interpretation on 05/16/2021 at 2:08 pm to provider [facility] , who verbally acknowledged these results. Electronically Signed   By: Kerby Moors M.D.   On: 05/16/2021 14:09   Result Date: 05/16/2021 CLINICAL DATA:  Evaluate for pneumonia. Cough for 2 weeks. Complications status post hernia repair EXAM: CHEST - 2 VIEW COMPARISON:  04/11/2021 FINDINGS: Stable heart size. No pleural effusion or interstitial edema. Large mass in the region of the right CP angle is identified with air-fluid level measuring 10.6 x 11.1 cm. The left lung appears clear. No acute airspace opacities identified. IMPRESSION: Interval reaccumulation of right lower chest  fluid collection now with air-fluid level. Recommend further evaluation with repeat CT of the chest with contrast material and/or referral to thoracic surgery or interventional radiology. Electronically Signed: By: Kerby Moors M.D. On: 05/16/2021 13:35   CT Chest W Contrast  Result Date: 05/16/2021 CLINICAL DATA:  Lung/mediastinal abscess. Chest hernia repair November 30th. Recent subdiaphragmatic drain placement. EXAM: CT CHEST WITH CONTRAST TECHNIQUE: Multidetector CT imaging of the chest was performed during intravenous contrast administration. RADIATION DOSE REDUCTION: This exam was performed according to the departmental dose-optimization program which includes automated exposure control, adjustment of the mA and/or kV according to patient size and/or use of iterative reconstruction technique. CONTRAST:  49mL OMNIPAQUE IOHEXOL 350 MG/ML SOLN COMPARISON:  Radiograph earlier today. Lung bases from abdominal CT 05/05/2021, chest CT 04/11/2021 FINDINGS: Cardiovascular: The heart is normal in size. There are coronary artery calcifications. Slight leftward cardiac deviation due to right mediastinal fluid collection. Thoracic aorta is normal in caliber. Mediastinum/Nodes: Right paramediastinal fluid collection, interval removal of drainage catheter from prior abdominal CT. Collection measures 10.1 x 10.4 x 8.2 cm (TR by AP by CC), measured on series 3 image 100 on series 5 image 87. Air-fluid level, approximately 60% fluid. There is surrounding fat stranding and inflammatory change. This appears to be supradiaphragmatic in location. Small amount of non organized fluid tracks anterior inferiorly. There are small adjacent epicardial lymph nodes. Small mediastinal lymph nodes are not enlarged by size criteria. No hilar adenopathy. No esophageal wall thickening. Lungs/Pleura: Compressive atelectasis in the anterior right lung related to anterior mediastinal collection. The lungs are otherwise clear. No pleural  effusion or pneumothorax. No pulmonary nodule. Trachea and central bronchi are patent. Upper Abdomen: Stranding and small amount of ill-defined fluid in the anterior upper abdomen anterior to the liver. This appears similar to abdominal CT 11 days ago. Hepatic steatosis. Musculoskeletal: There are no acute or suspicious osseous abnormalities. No subcutaneous fluid collection at site of drain. IMPRESSION: 1. Reaccumulation of right paramediastinal air fluid collection after removal of drainage catheter. Collection measures 10.1 x 10.4 x 8.2 cm, with surrounding fat stranding and inflammatory change. This appears to be supradiaphragmatic in location. 2. Stranding and small amount of ill-defined fluid in the anterior upper abdomen anterior to the liver, similar to abdominal CT 11 days ago. 3. Aortic atherosclerosis and coronary artery calcifications Electronically Signed   By: Keith Rake M.D.   On: 05/16/2021 21:20   DG Chest Port 1 View  Result Date: 05/18/2021 CLINICAL DATA:  Intrathoracic abscess. EXAM: PORTABLE CHEST 1 VIEW COMPARISON:  05/17/2021 FINDINGS: 0816 hours. Low lung volumes. Cavitary lesion seen medial right hemithorax on the previous study has decreased substantially in the interval. Cardiopericardial silhouette is at upper limits of normal for size. Bibasilar atelectasis/infiltrate with small bilateral pleural effusions again noted. Although a discrete pleural line is  not visible, lung markings at the periphery of the right hemithorax are asymmetrically decreased. IMPRESSION: 1. Interval decrease in size of cavitary lesion medial right hemithorax. 2. Persistent bibasilar atelectasis/infiltrate with small bilateral pleural effusions. 3. Decreased lung markings in the periphery of the right hemithorax. Although a discrete pleural line is not visible, this does raise the question of pleural gas/pneumothorax. Electronically Signed   By: Misty Stanley M.D.   On: 05/18/2021 10:08   DG CHEST PORT  1 VIEW  Result Date: 05/17/2021 CLINICAL DATA:  46 year old female with shortness of breath. EXAM: PORTABLE CHEST 1 VIEW COMPARISON:  Chest CT 05/16/2021 and earlier. FINDINGS: Portable AP semi upright view at 0605 hours. Air in fluid containing mass along the right cardiophrenic angle as seen by CT. Stable lung volumes and ventilation from yesterday. Visualized tracheal air column is within normal limits. No pneumothorax. No pleural effusion is evident. Paucity of bowel gas. No acute osseous abnormality identified. IMPRESSION: 1. Recurrent right cardiophrenic air-fluid collection stable from CT yesterday. 2. No new cardiopulmonary abnormality. Electronically Signed   By: Genevie Ann M.D.   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 Percutaneous drain was recently removed from this area. Patient presents for new drain placement. EXAM: CT-GUIDED PLACEMENT OF A RIGHT CHEST DRAIN MEDICATIONS: Moderate sedation ANESTHESIA/SEDATION: Moderate (conscious) sedation was employed during this procedure. A total of Versed 4.0mg  and fentanyl 100 mcg was administered intravenously at the order of the provider performing the procedure. Total intra-service moderate sedation time: 56 minutes. Patient's level of consciousness and vital signs were monitored continuously by radiology nurse throughout the procedure under the supervision of the provider performing the procedure. COMPLICATIONS: None immediate. PROCEDURE: Informed written consent was obtained from the patient after a thorough discussion of the procedural risks, benefits and alternatives. All questions were addressed. A timeout was performed prior to the initiation of the procedure. Patient was placed supine on the CT scanner. CT images through the lower chest were obtained. The large  air-fluid collection in the right lower chest and old hernia space was identified. The right anterior chest was prepped and draped in sterile fashion. Maximal barrier sterile technique was utilized including caps, mask, sterile gowns, sterile gloves, sterile drape, hand hygiene and skin antiseptic. Skin was anesthetized with 1% lidocaine. A small incision was made. Using CT guidance, an 18 gauge trocar needle was directed into the air-fluid collection and foul-smelling brown fluid was aspirated. Superstiff Amplatz wire was advanced into the collection and the tract was dilated to accommodate a 12 [place] multipurpose drain. Approximally 300 mL of brown fluid was removed. Follow up CT images were obtained. The drain was slightly pulled back in order to aspirate more fluid and air. Drain was initially connected to a PleurEvac device but there was minimal output with suction. Drain was flushed with saline and attached to a suction bulb. Catheter was sutured to skin. Dressing was placed. FINDINGS: Large air-fluid collection in the medial right lower chest at the old Morgagni hernia site. Fluid collection does not appear to be communicating with the right pleural space. 300 mL of foul-smelling brown fluid was removed. At the end of the procedure, the air-fluid collection was decompressed but there was a small amount of residual gas within this collection. IMPRESSION: CT-guided placement of a drainage catheter within the right lower chest fluid collection  located in the old hernia space. 300 mL of foul-smelling fluid was removed. Collection was decompressed at the end of the procedure. Fluid was sent for culture. Electronically Signed   By: Markus Daft M.D.   On: 05/17/2021 18:10    Labs:  CBC: Recent Labs    05/16/21 1224 05/16/21 1521 05/17/21 0420 05/18/21 0126  WBC 16.1* 13.9* 15.2* 11.0*  HGB 14.6 14.5 13.1 12.8  HCT 43.0 44.5 40.3 40.0  PLT 482* 479* 436* 421*    COAGS: Recent Labs    02/21/21 1017  04/30/21 0835  INR 1.0 1.0  APTT 28  --     BMP: Recent Labs    07/30/20 0944 07/30/20 1117 02/25/21 0436 05/16/21 1224 05/16/21 1521 05/17/21 0420 05/18/21 0126  NA 140   < > 138 134* 134* 131* 131*  K 4.9   < > 3.8 4.1 4.0 3.6 3.6  CL 104   < > 102 99 97* 97* 95*  CO2 29   < > 27 22 25  21* 25  GLUCOSE 95   < > 120* 108* 153* 116* 125*  BUN 11   < > 7 10 8 7 13   CALCIUM 9.0   < > 8.8* 9.6 9.2 8.8* 8.7*  CREATININE 0.74   < > 0.84 0.79 0.71 0.69 0.99  GFRNONAA 97   < > >60  --  >60 >60 >60  GFRAA 113  --   --   --   --   --   --    < > = values in this interval not displayed.    LIVER FUNCTION TESTS: Recent Labs    06/11/20 1113 07/30/20 0944 11/04/20 1110 02/21/21 1017 05/16/21 1224 05/16/21 1521  BILITOT 0.5   < > 0.2 0.7 0.7 0.7  AST 93*   < > 44* 58* 23 27  ALT 75*   < > 44* 58* 24 28  ALKPHOS 113  --  91 72  --  96  PROT 7.2   < > 7.1 6.7 7.4 7.7  ALBUMIN 4.3  --  4.2 3.3*  --  3.0*   < > = values in this interval not displayed.    Assessment and Plan:  CT guided chest drain 05/17/21 with Dr. Anselm Pancoast.   Pt sitting upright in bed watching TV. Family at bedside. She states she feels better but still having "sweats". She denies N/V and reports she ate a full breakfast with no difficulty. She denies pain at the site.  Medical right chest tube insertion site unremarkable with sutures/statlock in place. No redness, swelling, oozing or bleeding noted at site. ~10 cc tan, feculent OP in JP w/ 80cc documented in Epic. Flushes/aspirates easily. Dressing C/D/I WBC 11.0 (15.2), afebrile. VSS.   Continue documenting OP in Epic q shift.  Continue flushing TID.  Change dressing q shift or as needed.   Please call IR with questions or concerns.    Electronically Signed: Tyson Alias, NP 05/18/2021, 11:50 AM   I spent a total of 15 Minutes at the the patient's bedside AND on the patient's hospital floor or unit, greater than 50% of which was counseling/coordinating  care for chest drain placement.

## 2021-05-18 NOTE — Progress Notes (Signed)
Pharmacy Antibiotic Note  Rhonda Colon is a 46 y.o. female admitted on 05/16/2021 with sepsis.  Pharmacy has been consulted for Zosyn/Vancomycin dosing.  D3 zosyn/vanc  WBC trending down 15.2 > 11, afebrile PCT <0.1, lactate <0.3 SCr bumped 0.69 > 0.99  2/18 CT placed - 300cc foul smelling brown fluid and large amount of gas  2/19 VT 10 - drawn appropriately ~8h after last dose  Subtherapeutic vancomycin (trough of 10) on 750 mg Q8H with estimated AUC 496 using SCr 0.8. Based on trough and nomogram dosing, new dose should be 1250 mg Q8H. Given uptrending renal function, will dose more conservatively.  Plan: Zosyn 3.375 gm IV every 8 hours Vancomycin 1250 mg IV every 12 hours  Obtain levels as indicated  F/u BCx and reincubated abscess culture Monitor renal function, CBC, cultures/sensitivities, LOT and de-escalate as able  Temp (24hrs), Avg:98.5 F (36.9 C), Min:97.8 F (36.6 C), Max:99.4 F (37.4 C)  Recent Labs  Lab 05/16/21 1224 05/16/21 1521 05/16/21 1716 05/16/21 2009 05/17/21 0420 05/18/21 0126 05/18/21 1000  WBC 16.1* 13.9*  --   --  15.2* 11.0*  --   CREATININE 0.79 0.71  --   --  0.69 0.99  --   LATICACIDVEN  --   --  1.2 <0.3*  --   --   --   VANCOTROUGH  --   --   --   --   --   --  10*    Estimated Creatinine Clearance: 96.8 mL/min (by C-G formula based on SCr of 0.99 mg/dL).    Allergies  Allergen Reactions   Wound Dressing Adhesive Dermatitis and Rash    Antimicrobials this admission: Zosyn 2/17 >> Vancomycin 2/17   Dose adjustments this admission: none  Microbiology results: 2/1 abscess: few cutibacterium avidum 2/17 BCx: NG x2d 2/18 abscess: abundant GPC, GPR - reincubated  Thank you for allowing pharmacy to be a part of this patients care.  Filbert Schilder, PharmD PGY1 Pharmacy Resident 05/18/2021  10:57 AM  Please check [email or web address] for unit-specific pharmacy phone numbers.

## 2021-05-18 NOTE — Care Management (Signed)
°  Transition of Care (TOC) Screening Note   Patient Details  Name: MARIANITA BOTKIN Date of Birth: [birth year]   Transition of Care [facility]) CM/SW Contact:    Lawerance Sabal, RN Phone Number: 05/18/2021, 9:04 AM    Transition of Care Department [facility]) has reviewed patient and we will continue to monitor patient advancement through interdisciplinary progression rounds. If new patient transition needs arise, please place a TOC consult.

## 2021-05-18 NOTE — Progress Notes (Addendum)
° °   °  [address]       [place] [address]             [phone]       Subjective:  Patient upset about situation.  She doesn't like having tube in place.  She is having a lot of pain.  Objective: Vital signs in last 24 hours: Temp:  [97.8 F (36.6 C)-99.4 F (37.4 C)] 97.8 F (36.6 C) (02/19 0800) Pulse Rate:  [85-117] 93 (02/19 0800) Cardiac Rhythm: Normal sinus rhythm (02/19 0700) Resp:  [12-33] 17 (02/19 0800) BP: (99-132)/(59-86) 131/86 (02/19 0910) SpO2:  [92 %-99 %] 96 % (02/19 0800)  Intake/Output from previous day: 02/18 0701 - 02/19 0700 In: 645 [P.O.:480; I.V.:5; IV Piggyback:150] Out: 380 [Drains:80; Chest Tube:300]  General appearance: alert, cooperative, and no distress Heart: regular rate and rhythm Lungs: clear to auscultation bilaterally Abdomen: soft, non-tender; bowel sounds normal; no masses,  no organomegaly Extremities: extremities normal, atraumatic, no cyanosis or edema Wound: clean and dry, purulence in JP drain  Lab Results: Recent Labs    05/17/21 0420 05/18/21 0126  WBC 15.2* 11.0*  HGB 13.1 12.8  HCT 40.3 40.0  PLT 436* 421*   BMET:  Recent Labs    05/17/21 0420 05/18/21 0126  NA 131* 131*  K 3.6 3.6  CL 97* 95*  CO2 21* 25  GLUCOSE 116* 125*  BUN 7 13  CREATININE 0.69 0.99  CALCIUM 8.8* 8.7*    PT/INR: No results for input(s): LABPROT, INR in the last 72 hours. ABG No results found for: PHART, HCO3, TCO2, ACIDBASEDEF, O2SAT CBG (last 3)  No results for input(s): GLUCAP in the last 72 hours.  Assessment/Plan:  CV-NSR, BP stable- continue home Zestril Pulm- post operative abscess.. JP drain placed by IR yesterday, good drainage on CXR... leave drain in place, will likely need VATS to ensure complete treatment ID- afebrile, leukocytosis improving, Blood Cx are negative to date.. Gram stain showing GPC/GPR.[name] continue Vanc, Zosyn for now, will narrow once culture complete Pain control- will give 1  time dose of Toradol for pain relief today, add Morphine prn for breakthrough pain ANxiety- continue xanax  Dispo- patient stable, JP drain in place with removal of 300 cc purulent drainage, continue broad spectrum ABX until culture results, pain medication adjusted.. will likely need VATS for complete treatment of infection, Dr. Kipp Brood will be back tomorrow   LOS: 2 days    Ellwood Handler, PA-C  05/18/2021  Patient seen and examined, agree with above She looks better, afebrile and WBC down Growing GPC and GPR from fluid which is frankly purulent Will likely need surgery to remove mesh Continue Vanco and Zosyn  Remo Lipps C. Roxan Hockey, MD [facility] [phone]

## 2021-05-18 NOTE — Hospital Course (Addendum)
History of Present Illness:  Rhonda Colon is a 46 yo female with known history of HTN, HLD, Morbid Obesity, and Depression.  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  She was evaluated by Dr. Kipp Brood who ultimately offered surgical repair.  This was done via a Robotic Assisted approach on 02/24/2021.  The patient initially did well post operatively.  However on follow up with Dr. Kipp Brood on 04/04/2021 at which time she complained increased pain in her epigastrium, which developed about 2 weeks prior to presentation.  She also noticed some blood in her rectum which had resolved.  CXR obtained at that time showed recurrence of previously repaired hernia.  It was felt CT scan should be obtained and showed no recurrence of her hernia.  However there was a large fluid collection felt to be a seroma in her hernia space.  It was recommended she undergo a CT guided drain placement to treat the collection.  This was placed on 04/30/2021 with initial removal of 1L of fluid which improved her symptoms.  She followed up with Interventional radiology on 2/6 at which time her drain output was minimal.  Fluoroscopy of the drain showed minimal residual activity and her drain was removed.  The patient developed fever and increased work of breathing.  She presented to her PCP for workup on 2/17 and after speaking with Dr. Kipp Brood she was referred to the Emergency Department for evaluation.  She was given an IM dose of Rocephin at her family doctors office prior to arrival.  In the ED the patient states she was experiencing headache, fatigue, and chills.  CXR from her PCP office showed a large fluid collection.  Lab work shows a mild leukocytosis with WBC of 16K.  Cardiothoracic consultation was requested and felt the patient should be admitted for broad spectrum ABX and repeat IR placement to drain collection.  She was evaluated by Dr. Roxan Hockey in the ED.  The patient admitted to a several days of  fever, chills, diaphoresis, headache, myalgias, shortness of breath with cough, and mild nausea/vomiting.    Hospital Course:  The patient was started on broad spectrum antibiotics.  She underwent IR placement of drain into the right sided thoracic abscess.  This resulted in removal of 300 cc of purulent fluid.  Cultures were sent and revealed Streptococcus Mitis.  Sensitivities showed Ceftriaxone would be the best treatment agent.  She underwent PICC line placement.  Her antibiotics were transitioned to Ceftriaxone.   Her leukocytosis improved to normal.  Her blood cultures were negative.  It was felt she would require Robotic Assisted Video Thoracoscopy for treatment of chest abscess.  She was taken to the operating room on 05/22/2020.  She underwent Right Video Assisted Thoracoscopy with drainage of phlegmon and placement of drainage tubes.  She tolerated the procedure without difficulty, was extubated, and taken to the ICU in stable condition.  She required ICU care due to need for continuous antibiotic irrigation.  Drains were kept in place for a few days.  She was started on gabapentin for neuropathic pain.  She is showing steady progress in regards to increasing activity.  The patient's chest tube output remained low.  Her chest tube was removed without difficulty.  Her blake drain with bulb suction remained in place.  Her leukocytosis has stabilized.  She continues to be afebrile she continues to work on pulmonary toilet and chest x-ray appearance remains fairly stable She returned to the OR on 03/06  in order to undergo an Xi robotic assisted removal of diaphragmatic mesh and repair of diaphragmatic hernia. Keenan Bachelor drain was removed on 03/08. She was ambulating on room air with good oxygenation. All wounds are clean, dry, and healing without signs of infection. Skin irritation secondary to adhesive continues to improve. She has been tolerating a diet. Good pain control was achieved with Tramadol and  Tylenol. Dr. Kipp Brood also told her she may use NSAID (Ibuprofen etc) as directed also. She has been tolerating a diet and has had multiple bowel movements. She has been ambulating well on room air. All wounds are clean, dry and healing without signs of infection. Per patient request, because of antibiotics, she is requesting a prescription for Diflucan PRN at discharge. We consulted infectious disease regarding further antibiotic recommendation. Ceftriaxone was stopped and she was given IV Unasyn. This will be changed to oral Augmentin at discharge. Patient was going to be discharged in am on 03/10 but decided she wanted to go home today 03/09. As discussed with Dr. Kipp Brood, will discharge today.

## 2021-05-19 ENCOUNTER — Inpatient Hospital Stay (HOSPITAL_COMMUNITY): Payer: 59

## 2021-05-19 ENCOUNTER — Inpatient Hospital Stay: Payer: Self-pay

## 2021-05-19 DIAGNOSIS — J869 Pyothorax without fistula: Secondary | ICD-10-CM

## 2021-05-19 LAB — BASIC METABOLIC PANEL
Anion gap: 9 (ref 5–15)
BUN: 6 mg/dL (ref 6–20)
CO2: 29 mmol/L (ref 22–32)
Calcium: 8.7 mg/dL — ABNORMAL LOW (ref 8.9–10.3)
Chloride: 99 mmol/L (ref 98–111)
Creatinine, Ser: 0.67 mg/dL (ref 0.44–1.00)
GFR, Estimated: >60 mL/min (ref >60)
Glucose, Bld: 111 mg/dL — ABNORMAL HIGH (ref 70–99)
Potassium: 3.8 mmol/L (ref 3.5–5.1)
Sodium: 137 mmol/L (ref 135–145)

## 2021-05-19 MED ORDER — SODIUM CHLORIDE 0.9% FLUSH
10.0000 mL | INTRAVENOUS | Status: DC | PRN
  Administered 2021-05-20: 20 mL

## 2021-05-19 MED ORDER — CHLORHEXIDINE GLUCONATE CLOTH 2 % EX PADS
6.0000 | MEDICATED_PAD | Freq: Every day | CUTANEOUS | Status: DC
  Administered 2021-05-19 – 2021-06-02 (×14): 6 via TOPICAL

## 2021-05-19 MED ORDER — SODIUM CHLORIDE 0.9% FLUSH
10.0000 mL | Freq: Two times a day (BID) | INTRAVENOUS | Status: DC
  Administered 2021-05-19 (×2): 20 mL
  Administered 2021-05-20 (×2): 10 mL
  Administered 2021-05-21: 20 mL
  Administered 2021-05-21: 10 mL

## 2021-05-19 NOTE — Progress Notes (Signed)
°  Right chest abscess- old hernia space  Drain Location: Rt chest Size: Fr size: 12 Fr Date of placement: 05/17/18  Currently to: Drain collection device: suction bulb 24 hour output:  Output by Drain (mL) 05/17/21 0701 - 05/17/21 1900 05/17/21 1901 - 05/18/21 0700 05/18/21 0701 - 05/18/21 1900 05/18/21 1901 - 05/19/21 0700 05/19/21 0701 - 05/19/21 1444  Closed System Drain 1 Right RUQ Other (Comment) 10 Fr. 50 30 20 20      Interval imaging/drain manipulation:  none  Current examination: Flushes/aspirates easily.  Insertion site unremarkable. Suture and stat lock in place. Dressed appropriately.  OP purulent  Plan: Continue TID flushes with 5 cc NS. Record output Q shift. Dressing changes QD or PRN if soiled.  Call IR APP or on call IR MD if difficulty flushing or sudden change in drain output.  Repeat imaging/possible drain injection once output < 10 mL/QD (excluding flush material.)  Plans per chart:  For VATS per Dr note  IR will continue to follow - please call with questions or concerns.

## 2021-05-19 NOTE — Progress Notes (Addendum)
° °   °  [address]       [place] [address]             [phone]        Subjective:  No new complaints.   She is speaking with Dr. Cliffton Asters about her current condition and treatment of her issue.  Objective: Vital signs in last 24 hours: Temp:  [97.5 F (36.4 C)-98.1 F (36.7 C)] 97.7 F (36.5 C) (02/20 0349) Pulse Rate:  [82-90] 88 (02/20 0349) Cardiac Rhythm: Normal sinus rhythm (02/20 0400) Resp:  [16-18] 18 (02/20 0349) BP: (91-131)/(58-86) 118/68 (02/20 0349) SpO2:  [92 %-98 %] 97 % (02/20 0349)  Intake/Output from previous day: 02/19 0701 - 02/20 0700 In: 2004.8 [P.O.:480; I.V.:1168.7; IV Piggyback:346.1] Out: 60 [Drains:40; Chest Tube:20]  General appearance: alert, cooperative, and no distress Heart: regular rate and rhythm Lungs: clear to auscultation bilaterally Abdomen: soft, non-tender; bowel sounds normal; no masses,  no organomegaly Extremities: extremities normal, atraumatic, no cyanosis or edema Wound: JP in place, purulent material in bulb  Lab Results: Recent Labs    05/17/21 0420 05/18/21 0126  WBC 15.2* 11.0*  HGB 13.1 12.8  HCT 40.3 40.0  PLT 436* 421*   BMET:  Recent Labs    05/17/21 0420 05/18/21 0126  NA 131* 131*  K 3.6 3.6  CL 97* 95*  CO2 21* 25  GLUCOSE 116* 125*  BUN 7 13  CREATININE 0.69 0.99  CALCIUM 8.8* 8.7*    PT/INR: No results for input(s): LABPROT, INR in the last 72 hours. ABG No results found for: PHART, HCO3, TCO2, ACIDBASEDEF, O2SAT CBG (last 3)  No results for input(s): GLUCAP in the last 72 hours.  Assessment/Plan:  CV- NSR, H/O HTN-on home Lisinopril Pulm- JP drain in place, 40 cc since output will remain in place, patient will require VATS ID- afebrile, OR culture shows Streptococcus Mitis/oralis- on Vancomycin, Zosyn until formal sensitivities come back Pain control- improved continue Morphine prn, oxycodone Anxiety- continue xanax Dispo- patient stable, will continue broad  spectrum ABX, require VATS to drain/clear infection, await sensitivity results then will schedule surgery   LOS: 3 days    Lowella Dandy, PA-C 05/19/2021  Agree with above Awaiting final cultures and sensitivities Will require staged approach for mesh removal.  Will plan for R VATS with abx irrigation this week, and mesh removal and tissue reconstruction to follow.  Angline Schweigert Keane Scrape

## 2021-05-20 DIAGNOSIS — J869 Pyothorax without fistula: Secondary | ICD-10-CM | POA: Diagnosis not present

## 2021-05-20 LAB — BASIC METABOLIC PANEL
Anion gap: 10 (ref 5–15)
BUN: 6 mg/dL (ref 6–20)
CO2: 27 mmol/L (ref 22–32)
Calcium: 8.6 mg/dL — ABNORMAL LOW (ref 8.9–10.3)
Chloride: 99 mmol/L (ref 98–111)
Creatinine, Ser: 0.62 mg/dL (ref 0.44–1.00)
GFR, Estimated: >60 mL/min (ref >60)
Glucose, Bld: 99 mg/dL (ref 70–99)
Potassium: 3.7 mmol/L (ref 3.5–5.1)
Sodium: 136 mmol/L (ref 135–145)

## 2021-05-20 LAB — CBC
HCT: 37.2 % (ref 36.0–46.0)
Hemoglobin: 12.2 g/dL (ref 12.0–15.0)
MCH: 29.4 pg (ref 26.0–34.0)
MCHC: 32.8 g/dL (ref 30.0–36.0)
MCV: 89.6 fL (ref 80.0–100.0)
Platelets: 485 10*3/uL — ABNORMAL HIGH (ref 150–400)
RBC: 4.15 MIL/uL (ref 3.87–5.11)
RDW: 12.6 % (ref 11.5–15.5)
WBC: 6.9 10*3/uL (ref 4.0–10.5)
nRBC: 0 % (ref 0.0–0.2)

## 2021-05-20 MED ORDER — FLUCONAZOLE 100 MG PO TABS
100.0000 mg | ORAL_TABLET | Freq: Every day | ORAL | Status: DC | PRN
  Administered 2021-05-23 – 2021-05-31 (×5): 100 mg via ORAL
  Filled 2021-05-20 (×9): qty 1

## 2021-05-20 MED ORDER — SODIUM CHLORIDE 0.9 % IV SOLN
2.0000 g | INTRAVENOUS | Status: DC
  Administered 2021-05-20 – 2021-06-05 (×17): 2 g via INTRAVENOUS
  Filled 2021-05-20 (×17): qty 20

## 2021-05-20 MED ORDER — SODIUM CHLORIDE 0.9 % IV SOLN
2.0000 g | INTRAVENOUS | Status: DC
  Filled 2021-05-20: qty 20

## 2021-05-20 MED ORDER — CLOTRIMAZOLE 1 % VA CREA: 1.0000 | TOPICAL_CREAM | Freq: Every day | VAGINAL | Status: DC | PRN

## 2021-05-20 NOTE — Progress Notes (Addendum)
° °   °  [address]       [place] [address]             [phone]       Procedure(s) (LRB): XI ROBOTIC ASSISTED THORASCOPY-WEDGE RESECTION (Right)  Subjective:  Patient continues to feel better.  She has been able to move her bowels w/o difficulty.  Her pain is well controlled.  Objective: Vital signs in last 24 hours: Temp:  [97.8 F (36.6 C)-98.1 F (36.7 C)] 97.9 F (36.6 C) (02/21 0429) Pulse Rate:  [79-96] 88 (02/21 0429) Cardiac Rhythm: Normal sinus rhythm (02/20 0910) Resp:  [16-18] 18 (02/21 0429) BP: (95-122)/(62-75) 122/71 (02/21 0429) SpO2:  [92 %-97 %] 93 % (02/21 0429)  Intake/Output from previous day: 02/20 0701 - 02/21 0700 In: 1575.3 [P.O.:676; IV Piggyback:894.3] Out: 50 [Chest Tube:50]  General appearance: alert, cooperative, and no distress Heart: regular rate and rhythm Lungs: clear to auscultation bilaterally Abdomen: soft, non-tender; bowel sounds normal; no masses,  no organomegaly Extremities: extremities normal, atraumatic, no cyanosis or edema Wound: JP drain with continued purulent output  Lab Results: Recent Labs    05/18/21 0126 05/20/21 0420  WBC 11.0* 6.9  HGB 12.8 12.2  HCT 40.0 37.2  PLT 421* 485*   BMET:  Recent Labs    05/19/21 0915 05/20/21 0420  NA 137 136  K 3.8 3.7  CL 99 99  CO2 29 27  GLUCOSE 111* 99  BUN 6 6  CREATININE 0.67 0.62  CALCIUM 8.7* 8.6*    PT/INR: No results for input(s): LABPROT, INR in the last 72 hours. ABG No results found for: PHART, HCO3, TCO2, ACIDBASEDEF, O2SAT CBG (last 3)  No results for input(s): GLUCAP in the last 72 hours.  Assessment/Plan: S/P Procedure(s) (LRB): XI ROBOTIC ASSISTED THORASCOPY-WEDGE RESECTION (Right)  CV- NSR, BP stable- continue home Lisinopril Pulm- not requiring oxygen, ,continue IS Renal- creatinine, lytes are WNL ID- afebrile, leukocytosis resolved, back down to 6.9.Marland KitchenMarland Kitchen Culture shows strep midis sensitive to Ceftriaxone, I have spoken  with pharmacy who will adjust ABX regimen... picc line has been placed Vaginal yeast infection- patient gets when she takes ABX, medication ordered for when needed Anxiety- continue xanax Dispo- patient stable, JP drain 50 cc output will remain in place until surgery.. planning for VATS on Thursday to clean out infected area, continue ABX   LOS: 4 days    Lowella Dandy, PA-C 05/20/2021  Agree with above Will transition to ceftriaxone Will plan for R RATS on 2/23  Elior Robinette O Law Corsino

## 2021-05-21 LAB — CULTURE, BLOOD (ROUTINE X 2)
Culture: NO GROWTH
Culture: NO GROWTH
Special Requests: ADEQUATE
Special Requests: ADEQUATE

## 2021-05-21 NOTE — Progress Notes (Signed)
° °   °  [address]       [place] [address]             [phone]      Procedure(s) (LRB): XI ROBOTIC ASSISTED THORASCOPY-WEDGE RESECTION (Right)  Subjective:  No new complaints.  Questions about surgery addressed.  Objective: Vital signs in last 24 hours: Temp:  [97.6 F (36.4 C)-98.1 F (36.7 C)] 97.9 F (36.6 C) (02/22 0804) Pulse Rate:  [79-100] 83 (02/22 0804) Cardiac Rhythm: Normal sinus rhythm (02/21 2000) Resp:  [16-18] 17 (02/22 0804) BP: (98-130)/(61-86) 113/71 (02/22 0804) SpO2:  [94 %-96 %] 96 % (02/22 0804)  Intake/Output from previous day: 02/21 0701 - 02/22 0700 In: 218 [P.O.:118; IV Piggyback:100] Out: 40 [Drains:40]  General appearance: alert, cooperative, and no distress Heart: regular rate and rhythm Lungs: clear to auscultation bilaterally Abdomen: soft, non-tender; bowel sounds normal; no masses,  no organomegaly Extremities: extremities normal, atraumatic, no cyanosis or edema Wound: JP drain in place  Lab Results: Recent Labs    05/20/21 0420  WBC 6.9  HGB 12.2  HCT 37.2  PLT 485*   BMET:  Recent Labs    05/19/21 0915 05/20/21 0420  NA 137 136  K 3.8 3.7  CL 99 99  CO2 29 27  GLUCOSE 111* 99  BUN 6 6  CREATININE 0.67 0.62  CALCIUM 8.7* 8.6*    PT/INR: No results for input(s): LABPROT, INR in the last 72 hours. ABG No results found for: PHART, HCO3, TCO2, ACIDBASEDEF, O2SAT CBG (last 3)  No results for input(s): GLUCAP in the last 72 hours.  Assessment/Plan: S/P Procedure(s) (LRB): XI ROBOTIC ASSISTED THORASCOPY-WEDGE RESECTION (Right)  CV- NSR, BP controlled- continue Lisinopril Pulm- no acute issues, continue IS ID- afebrile, JP drain with 40 cc output--- Cultures grew Srep Mitis.[name] ABX sensitivities, on Ceftriaxone Vaginal yeast infection- continue Diflucan, Monistat Axiety-on xanax Dispo- patient stable, JP drain in place, for VATS tomorrow    LOS: 5 days    Lowella Dandy,  PA-C 05/21/2021

## 2021-05-21 NOTE — Progress Notes (Signed)
Referring Physician(s): Dr. Roxan Hockey  Supervising Physician: Mir, Sharen Heck  Patient Status:  [facility] - In-pt  Chief Complaint: History of diaphragmatic hernia repair with post-op recurrent right hemithorax fluid collection s/p drain placement 05/17/21 (initial drain placed 04/30/21 and removed 05/05/21).  Subjective: Patient sitting up in the chair with her parents at the bedside. She feels quite well and has only minimal complaints of pain related to the right lower chest drain. She is scheduled for VATS with Dr. Kipp Brood tomorrow.   Allergies: Wound dressing adhesive  Medications: Prior to Admission medications   Medication Sig Start Date End Date Taking? Authorizing Provider  acetaminophen (TYLENOL) 500 MG tablet Take 500 mg by mouth every 6 (six) hours as needed for moderate pain or fever.   Yes [provider]  ALPRAZolam Duanne Moron) 1 MG tablet Take 1 tablet (1 mg total) by mouth 2 (two) times daily as needed for anxiety. Patient taking differently: Take 0.5-1 mg by mouth 2 (two) times daily as needed for anxiety. 03/10/21  Yes BaxleyCresenciano Lick, MD  aspirin 81 MG tablet Take 81 mg by mouth daily.   Yes [provider]  cetirizine (ZYRTEC) 10 MG tablet Take 10 mg by mouth daily as needed for allergies.   Yes [provider]  FLUoxetine (PROZAC) 40 MG capsule Take 1 capsule (40 mg total) by mouth daily. 03/10/21  Yes Baxley, Cresenciano Lick, MD  lisinopril (ZESTRIL) 10 MG tablet Take 1 tablet (10 mg total) by mouth daily. 06/17/20  Yes Minus Breeding, MD  Multiple Vitamin (MULTIVITAMIN) capsule Take 1 capsule by mouth daily.   Yes [provider]  nitroGLYCERIN (NITROSTAT) 0.4 MG SL tablet Place 1 tablet (0.4 mg total) under the tongue every 5 (five) minutes as needed for chest pain. 06/17/20  Yes Minus Breeding, MD  omeprazole (PRILOSEC) 20 MG capsule Take 1 capsule by mouth once daily 03/09/21  Yes Baxley, Cresenciano Lick, MD  rosuvastatin (CRESTOR) 40 MG tablet Take 1  tablet by mouth once daily 04/24/21  Yes Minus Breeding, MD  Sodium Chloride Flush (NORMAL SALINE FLUSH) 0.9 % SOLN Use 3 to 5 mls to flush intracatheterly 2 times daily 04/30/21     Sodium Chloride Flush (NORMAL SALINE FLUSH) 0.9 % SOLN Use as directed 04/30/21   Suttle, Rosanne Ashing, MD     Vital Signs: BP 105/76 (BP Location: Left Arm)    Pulse 89    Temp 97.6 F (36.4 C) (Oral)    Resp 16    Ht 5\' 4"  (1.626 m)    Wt (!) 300 lb 0.7 oz (136.1 kg)    SpO2 95%    BMI 51.50 kg/m   Physical Exam Constitutional:      General: She is not in acute distress.    Appearance: She is not ill-appearing.  HENT:     Mouth/Throat:     Mouth: Mucous membranes are moist.     Pharynx: Oropharynx is clear.  Pulmonary:     Effort: Pulmonary effort is normal.  Abdominal:     Comments: Right lower chest/upper abdominal drain to suction. Site is clean and dry. 10-15 ml of purulent fluid in bulb.   Skin:    General: Skin is warm and dry.  Neurological:     Mental Status: She is alert and oriented to person, place, and time.    Imaging: DG Chest Port 1 View  Result Date: 05/19/2021 CLINICAL DATA:  A 46 year old female presents with history of reported intrathoracic abscess. EXAM:  PORTABLE CHEST 1 VIEW COMPARISON:  May 18, 2021 FINDINGS: Persistent added density in the cardiophrenic recess on the RIGHT and along the RIGHT hemidiaphragm and RIGHT heart border. A pigtail drain projects over the RIGHT lower chest in this area medially. EKG leads project over the chest. Cardiomediastinal contours are stable. The LEFT basilar consolidation with blunting of LEFT costodiaphragmatic sulcus is similar to the prior study. No visible pneumothorax. On limited assessment there is no acute skeletal process. IMPRESSION: 1. Persistent added density in the cardiophrenic recess on the RIGHT and along the RIGHT hemidiaphragm and RIGHT heart border at the site of abscess with pigtail drain in place. 2. Bibasilar airspace disease and  likely small bilateral effusions with similar appearance. 3. No visible pneumothorax. Electronically Signed   By: [name] Bills M.D.   On: 05/19/2021 08:06   DG Chest Port 1 View  Result Date: 05/18/2021 CLINICAL DATA:  Intrathoracic abscess. EXAM: PORTABLE CHEST 1 VIEW COMPARISON:  05/17/2021 FINDINGS: 0816 hours. Low lung volumes. Cavitary lesion seen medial right hemithorax on the previous study has decreased substantially in the interval. Cardiopericardial silhouette is at upper limits of normal for size. Bibasilar atelectasis/infiltrate with small bilateral pleural effusions again noted. Although a discrete pleural line is not visible, lung markings at the periphery of the right hemithorax are asymmetrically decreased. IMPRESSION: 1. Interval decrease in size of cavitary lesion medial right hemithorax. 2. Persistent bibasilar atelectasis/infiltrate with small bilateral pleural effusions. 3. Decreased lung markings in the periphery of the right hemithorax. Although a discrete pleural line is not visible, this does raise the question of pleural gas/pneumothorax. Electronically Signed   By: Misty Stanley M.D.   On: 05/18/2021 10:08   [place] EKG SITE RITE  Result Date: 05/19/2021 If [facility] image not attached, placement could not be confirmed due to current cardiac rhythm.   Labs:  CBC: Recent Labs    05/16/21 1521 05/17/21 0420 05/18/21 0126 05/20/21 0420  WBC 13.9* 15.2* 11.0* 6.9  HGB 14.5 13.1 12.8 12.2  HCT 44.5 40.3 40.0 37.2  PLT 479* 436* 421* 485*    COAGS: Recent Labs    02/21/21 1017 04/30/21 0835  INR 1.0 1.0  APTT 28  --     BMP: Recent Labs    07/30/20 0944 07/30/20 1117 05/17/21 0420 05/18/21 0126 05/19/21 0915 05/20/21 0420  NA 140   < > 131* 131* 137 136  K 4.9   < > 3.6 3.6 3.8 3.7  CL 104   < > 97* 95* 99 99  CO2 29   < > 21* 25 29 27   GLUCOSE 95   < > 116* 125* 111* 99  BUN 11   < > 7 13 6 6   CALCIUM 9.0   < > 8.8* 8.7* 8.7* 8.6*  CREATININE 0.74    < > 0.69 0.99 0.67 0.62  GFRNONAA 97   < > >60 >60 >60 >60  GFRAA 113  --   --   --   --   --    < > = values in this interval not displayed.    LIVER FUNCTION TESTS: Recent Labs    06/11/20 1113 07/30/20 0944 11/04/20 1110 02/21/21 1017 05/16/21 1224 05/16/21 1521  BILITOT 0.5   < > 0.2 0.7 0.7 0.7  AST 93*   < > 44* 58* 23 27  ALT 75*   < > 44* 58* 24 28  ALKPHOS 113  --  91 72  --  96  PROT  7.2   < > 7.1 6.7 7.4 7.7  ALBUMIN 4.3  --  4.2 3.3*  --  3.0*   < > = values in this interval not displayed.    Assessment and Plan:  History of diaphragmatic hernia repair with post-op recurrent right hemithorax fluid collection s/p drain placement 05/17/21  Patient is afebrile with decreasing leukocytosis. 40 ml output documented in Epic with another 10-15 ml of thick, purulent fluid in bulb.   Drain Location: Right lower chest Size: Fr size: 10 Fr Date of placement: 05/17/21 Currently to: Drain collection device: suction bulb 24 hour output:  Output by Drain (mL) 05/19/21 0701 - 05/19/21 1900 05/19/21 1901 - 05/20/21 0700 05/20/21 0701 - 05/20/21 1900 05/20/21 1901 - 05/21/21 0700 05/21/21 0701 - 05/21/21 1411  Closed System Drain 1 Right RUQ Other (Comment) 10 Fr.   20 20 0    Interval imaging/drain manipulation:  None  Current examination: Skin insertion site is clean and dry; suture and stat-lock intact. Dressing is clean and dry. 10-15 ml of thick, purulent fluid in bulb.   Plan: Continue TID flushes with 5 cc NS. Record output Q shift. Dressing changes QD or PRN if soiled.  Call IR APP or on call IR MD if difficulty flushing or sudden change in drain output.  Repeat imaging/possible drain injection once output < 10 mL/QD (excluding flush material.)  Patient scheduled for right VATS with Dr. Kipp Brood 05/22/21. Drain placed by IR will likely be removed during procedure. IR will check procedure notes tomorrow.   Electronically Signed: Soyla Dryer,  AGACNP-BC [phone] 05/21/2021, 2:06 PM   I spent a total of 15 Minutes at the the patient's bedside AND on the patient's hospital floor or unit, greater than 50% of which was counseling/coordinating care for right lower chest drain.

## 2021-05-22 ENCOUNTER — Encounter (HOSPITAL_COMMUNITY)
Admission: EM | Disposition: A | Discharge status: Home / Self Care | Payer: Self-pay | Source: Home / Self Care | Attending: Thoracic Surgery (Cardiothoracic Vascular Surgery)

## 2021-05-22 ENCOUNTER — Inpatient Hospital Stay (HOSPITAL_COMMUNITY): Payer: 59

## 2021-05-22 ENCOUNTER — Encounter (HOSPITAL_COMMUNITY): Payer: Self-pay | Admitting: Thoracic Surgery (Cardiothoracic Vascular Surgery)

## 2021-05-22 ENCOUNTER — Other Ambulatory Visit: Payer: Self-pay

## 2021-05-22 DIAGNOSIS — I9789 Other postprocedural complications and disorders of the circulatory system, not elsewhere classified: Secondary | ICD-10-CM

## 2021-05-22 DIAGNOSIS — J9859 Other diseases of mediastinum, not elsewhere classified: Secondary | ICD-10-CM | POA: Diagnosis not present

## 2021-05-22 DIAGNOSIS — I10 Essential (primary) hypertension: Secondary | ICD-10-CM | POA: Diagnosis not present

## 2021-05-22 DIAGNOSIS — I2510 Atherosclerotic heart disease of native coronary artery without angina pectoris: Secondary | ICD-10-CM

## 2021-05-22 DIAGNOSIS — I252 Old myocardial infarction: Secondary | ICD-10-CM | POA: Diagnosis not present

## 2021-05-22 HISTORY — PX: INTERCOSTAL NERVE BLOCK: SHX5021

## 2021-05-22 LAB — COMPREHENSIVE METABOLIC PANEL
ALT: 60 U/L — ABNORMAL HIGH (ref 0–44)
AST: 68 U/L — ABNORMAL HIGH (ref 15–41)
Albumin: 2.4 g/dL — ABNORMAL LOW (ref 3.5–5.0)
Alkaline Phosphatase: 84 U/L (ref 38–126)
Anion gap: 8 (ref 5–15)
BUN: 7 mg/dL (ref 6–20)
CO2: 28 mmol/L (ref 22–32)
Calcium: 8.8 mg/dL — ABNORMAL LOW (ref 8.9–10.3)
Chloride: 103 mmol/L (ref 98–111)
Creatinine, Ser: 0.6 mg/dL (ref 0.44–1.00)
GFR, Estimated: >60 mL/min (ref >60)
Glucose, Bld: 106 mg/dL — ABNORMAL HIGH (ref 70–99)
Potassium: 3.8 mmol/L (ref 3.5–5.1)
Sodium: 139 mmol/L (ref 135–145)
Total Bilirubin: 0.3 mg/dL (ref 0.3–1.2)
Total Protein: 6.2 g/dL — ABNORMAL LOW (ref 6.5–8.1)

## 2021-05-22 LAB — BLOOD GAS, ARTERIAL
Acid-Base Excess: 5.8 mmol/L — ABNORMAL HIGH (ref 0.0–2.0)
Bicarbonate: 30.6 mmol/L — ABNORMAL HIGH (ref 20.0–28.0)
FIO2: 21 %
O2 Saturation: 96.1 %
Patient temperature: 36.8
pCO2 arterial: 44 mmHg (ref 32–48)
pH, Arterial: 7.45 (ref 7.35–7.45)
pO2, Arterial: 70 mmHg — ABNORMAL LOW (ref 83–108)

## 2021-05-22 LAB — AEROBIC/ANAEROBIC CULTURE W GRAM STAIN (SURGICAL/DEEP WOUND)

## 2021-05-22 LAB — PROTIME-INR
INR: 1 (ref 0.8–1.2)
Prothrombin Time: 13.1 seconds (ref 11.4–15.2)

## 2021-05-22 LAB — POCT I-STAT 7, (LYTES, BLD GAS, ICA,H+H)
Acid-Base Excess: 1 mmol/L (ref 0.0–2.0)
Bicarbonate: 29.6 mmol/L — ABNORMAL HIGH (ref 20.0–28.0)
Calcium, Ion: 1.21 mmol/L (ref 1.15–1.40)
HCT: 35 % — ABNORMAL LOW (ref 36.0–46.0)
Hemoglobin: 11.9 g/dL — ABNORMAL LOW (ref 12.0–15.0)
O2 Saturation: 100 %
Potassium: 3.9 mmol/L (ref 3.5–5.1)
Sodium: 140 mmol/L (ref 135–145)
TCO2: 32 mmol/L (ref 22–32)
pCO2 arterial: 64.2 mmHg — ABNORMAL HIGH (ref 32–48)
pH, Arterial: 7.272 — ABNORMAL LOW (ref 7.35–7.45)
pO2, Arterial: 221 mmHg — ABNORMAL HIGH (ref 83–108)

## 2021-05-22 LAB — APTT: aPTT: 29 seconds (ref 24–36)

## 2021-05-22 LAB — SURGICAL PCR SCREEN
MRSA, PCR: NEGATIVE
Staphylococcus aureus: NEGATIVE

## 2021-05-22 LAB — TYPE AND SCREEN
ABO/RH(D): O POS
Antibody Screen: NEGATIVE

## 2021-05-22 SURGERY — WEDGE RESECTION, LUNG, ROBOT-ASSISTED, THORACOSCOPIC
Anesthesia: General | Site: Chest | Laterality: Right

## 2021-05-22 MED ORDER — DEXTROSE 5 % IV SOLN
INTRAVENOUS | Status: DC | PRN
Start: 2021-05-22 — End: 2021-05-22
  Administered 2021-05-22: 3 g via INTRAVENOUS

## 2021-05-22 MED ORDER — DEXMEDETOMIDINE (PRECEDEX) IN NS 20 MCG/5ML (4 MCG/ML) IV SYRINGE
PREFILLED_SYRINGE | INTRAVENOUS | Status: DC | PRN
  Administered 2021-05-22 (×2): 8 ug via INTRAVENOUS
  Administered 2021-05-22: 4 ug via INTRAVENOUS

## 2021-05-22 MED ORDER — AMISULPRIDE (ANTIEMETIC) 5 MG/2ML IV SOLN
10.0000 mg | Freq: Once | INTRAVENOUS | Status: AC
  Administered 2021-05-22: 10 mg via INTRAVENOUS

## 2021-05-22 MED ORDER — LACTATED RINGERS IV SOLN: INTRAVENOUS | Status: DC

## 2021-05-22 MED ORDER — BUPIVACAINE HCL (PF) 0.5 % IJ SOLN
INTRAMUSCULAR | Status: AC
  Filled 2021-05-22: qty 30

## 2021-05-22 MED ORDER — MIDAZOLAM HCL 2 MG/2ML IJ SOLN
INTRAMUSCULAR | Status: AC
  Filled 2021-05-22: qty 2

## 2021-05-22 MED ORDER — BISACODYL 5 MG PO TBEC
10.0000 mg | DELAYED_RELEASE_TABLET | Freq: Every day | ORAL | Status: DC
  Administered 2021-05-23 – 2021-05-26 (×2): 10 mg via ORAL
  Filled 2021-05-22 (×10): qty 2

## 2021-05-22 MED ORDER — ACETAMINOPHEN 160 MG/5ML PO SOLN
1000.0000 mg | Freq: Four times a day (QID) | ORAL | Status: AC
  Filled 2021-05-22 (×2): qty 40.6

## 2021-05-22 MED ORDER — FENTANYL CITRATE (PF) 250 MCG/5ML IJ SOLN
INTRAMUSCULAR | Status: DC | PRN
  Administered 2021-05-22: 150 ug via INTRAVENOUS
  Administered 2021-05-22 (×2): 50 ug via INTRAVENOUS

## 2021-05-22 MED ORDER — DEXMEDETOMIDINE HCL IN NACL 200 MCG/50ML IV SOLN
0.4000 ug/kg/h | INTRAVENOUS | Status: DC
  Filled 2021-05-22: qty 50

## 2021-05-22 MED ORDER — CHLORHEXIDINE GLUCONATE 0.12 % MT SOLN: 15.0000 mL | Freq: Once | OROMUCOSAL | Status: AC

## 2021-05-22 MED ORDER — LIDOCAINE 2% (20 MG/ML) 5 ML SYRINGE
INTRAMUSCULAR | Status: DC | PRN
  Administered 2021-05-22: 40 mg via INTRAVENOUS

## 2021-05-22 MED ORDER — MIDAZOLAM HCL 2 MG/2ML IJ SOLN
INTRAMUSCULAR | Status: DC | PRN
  Administered 2021-05-22 (×2): 1 mg via INTRAVENOUS

## 2021-05-22 MED ORDER — HYDROMORPHONE HCL 1 MG/ML IJ SOLN
INTRAMUSCULAR | Status: AC
  Filled 2021-05-22: qty 1

## 2021-05-22 MED ORDER — LIDOCAINE 2% (20 MG/ML) 5 ML SYRINGE
INTRAMUSCULAR | Status: AC
  Filled 2021-05-22: qty 5

## 2021-05-22 MED ORDER — KETOROLAC TROMETHAMINE 15 MG/ML IJ SOLN
15.0000 mg | Freq: Four times a day (QID) | INTRAMUSCULAR | Status: DC
  Administered 2021-05-22 – 2021-05-23 (×3): 15 mg via INTRAVENOUS
  Filled 2021-05-22 (×3): qty 1

## 2021-05-22 MED ORDER — ORAL CARE MOUTH RINSE
15.0000 mL | Freq: Two times a day (BID) | OROMUCOSAL | Status: DC
  Administered 2021-05-22 – 2021-06-01 (×17): 15 mL via OROMUCOSAL

## 2021-05-22 MED ORDER — DEXAMETHASONE SODIUM PHOSPHATE 10 MG/ML IJ SOLN
INTRAMUSCULAR | Status: AC
  Filled 2021-05-22: qty 1

## 2021-05-22 MED ORDER — ONDANSETRON HCL 4 MG/2ML IJ SOLN
INTRAMUSCULAR | Status: AC
  Filled 2021-05-22: qty 2

## 2021-05-22 MED ORDER — SODIUM CHLORIDE 0.9 % IV SOLN
INTRAVENOUS | Status: AC
  Filled 2021-05-22: qty 1000

## 2021-05-22 MED ORDER — BUPIVACAINE LIPOSOME 1.3 % IJ SUSP
INTRAMUSCULAR | Status: AC
  Filled 2021-05-22: qty 20

## 2021-05-22 MED ORDER — SENNOSIDES-DOCUSATE SODIUM 8.6-50 MG PO TABS
1.0000 | ORAL_TABLET | Freq: Every day | ORAL | Status: DC
  Administered 2021-06-02: 1 via ORAL
  Filled 2021-05-22 (×5): qty 1

## 2021-05-22 MED ORDER — FENTANYL CITRATE (PF) 250 MCG/5ML IJ SOLN
INTRAMUSCULAR | Status: AC
  Filled 2021-05-22: qty 5

## 2021-05-22 MED ORDER — DEXAMETHASONE SODIUM PHOSPHATE 10 MG/ML IJ SOLN
INTRAMUSCULAR | Status: DC | PRN
Start: 2021-05-22 — End: 2021-05-22
  Administered 2021-05-22: 10 mg via INTRAVENOUS

## 2021-05-22 MED ORDER — ENOXAPARIN SODIUM 40 MG/0.4ML IJ SOSY
40.0000 mg | PREFILLED_SYRINGE | Freq: Every day | INTRAMUSCULAR | Status: DC
  Administered 2021-05-24 – 2021-05-29 (×6): 40 mg via SUBCUTANEOUS
  Filled 2021-05-22 (×7): qty 0.4

## 2021-05-22 MED ORDER — ACETAMINOPHEN 500 MG PO TABS
1000.0000 mg | ORAL_TABLET | Freq: Four times a day (QID) | ORAL | Status: AC
  Administered 2021-05-22 – 2021-05-27 (×16): 1000 mg via ORAL
  Filled 2021-05-22 (×18): qty 2

## 2021-05-22 MED ORDER — ONDANSETRON HCL 4 MG/2ML IJ SOLN
4.0000 mg | Freq: Four times a day (QID) | INTRAMUSCULAR | Status: DC | PRN
  Administered 2021-05-22: 18:00:00 4 mg via INTRAVENOUS
  Filled 2021-05-22: qty 2

## 2021-05-22 MED ORDER — 0.9 % SODIUM CHLORIDE (POUR BTL) OPTIME
TOPICAL | Status: DC | PRN
  Administered 2021-05-22: 2000 mL

## 2021-05-22 MED ORDER — SUGAMMADEX SODIUM 200 MG/2ML IV SOLN
INTRAVENOUS | Status: DC | PRN
  Administered 2021-05-22: 300 mg via INTRAVENOUS

## 2021-05-22 MED ORDER — HYDROMORPHONE HCL 1 MG/ML IJ SOLN
0.2500 mg | INTRAMUSCULAR | Status: DC | PRN
  Administered 2021-05-22 (×4): 0.5 mg via INTRAVENOUS

## 2021-05-22 MED ORDER — SODIUM CHLORIDE 0.9 % IR SOLN
Status: DC | PRN
  Administered 2021-05-22: 1

## 2021-05-22 MED ORDER — CEFAZOLIN IN SODIUM CHLORIDE 3-0.9 GM/100ML-% IV SOLN
3.0000 g | INTRAVENOUS | Status: DC
  Filled 2021-05-22: qty 100

## 2021-05-22 MED ORDER — CHLORHEXIDINE GLUCONATE 0.12 % MT SOLN
OROMUCOSAL | Status: AC
  Administered 2021-05-22: 15 mL via OROMUCOSAL
  Filled 2021-05-22: qty 15

## 2021-05-22 MED ORDER — ROCURONIUM BROMIDE 10 MG/ML (PF) SYRINGE
PREFILLED_SYRINGE | INTRAVENOUS | Status: AC
  Filled 2021-05-22: qty 10

## 2021-05-22 MED ORDER — ROCURONIUM BROMIDE 10 MG/ML (PF) SYRINGE
PREFILLED_SYRINGE | INTRAVENOUS | Status: DC | PRN
  Administered 2021-05-22: 30 mg via INTRAVENOUS
  Administered 2021-05-22: 50 mg via INTRAVENOUS
  Administered 2021-05-22: 20 mg via INTRAVENOUS

## 2021-05-22 MED ORDER — AMISULPRIDE (ANTIEMETIC) 5 MG/2ML IV SOLN
INTRAVENOUS | Status: AC
  Filled 2021-05-22: qty 4

## 2021-05-22 MED ORDER — PHENYLEPHRINE 40 MCG/ML (10ML) SYRINGE FOR IV PUSH (FOR BLOOD PRESSURE SUPPORT)
PREFILLED_SYRINGE | INTRAVENOUS | Status: DC | PRN
  Administered 2021-05-22: 120 ug via INTRAVENOUS

## 2021-05-22 MED ORDER — OXYCODONE HCL 5 MG PO TABS
5.0000 mg | ORAL_TABLET | ORAL | Status: DC | PRN
  Administered 2021-05-22: 22:00:00 5 mg via ORAL
  Administered 2021-05-22: 10 mg via ORAL
  Administered 2021-05-23 – 2021-05-25 (×10): 5 mg via ORAL
  Administered 2021-05-25: 20:00:00 10 mg via ORAL
  Administered 2021-05-25: 06:00:00 5 mg via ORAL
  Administered 2021-05-25: 14:00:00 10 mg via ORAL
  Administered 2021-05-25: 02:00:00 5 mg via ORAL
  Administered 2021-05-25 – 2021-05-26 (×2): 10 mg via ORAL
  Administered 2021-05-26 (×2): 5 mg via ORAL
  Administered 2021-05-26 – 2021-05-27 (×3): 10 mg via ORAL
  Administered 2021-05-27 (×2): 5 mg via ORAL
  Administered 2021-05-28 – 2021-06-01 (×12): 10 mg via ORAL
  Administered 2021-06-01: 5 mg via ORAL
  Administered 2021-06-02 – 2021-06-05 (×10): 10 mg via ORAL
  Filled 2021-05-22: qty 1
  Filled 2021-05-22: qty 2
  Filled 2021-05-22: qty 1
  Filled 2021-05-22: qty 2
  Filled 2021-05-22 (×2): qty 1
  Filled 2021-05-22 (×3): qty 2
  Filled 2021-05-22: qty 1
  Filled 2021-05-22 (×2): qty 2
  Filled 2021-05-22: qty 1
  Filled 2021-05-22 (×10): qty 2
  Filled 2021-05-22 (×2): qty 1
  Filled 2021-05-22 (×2): qty 2
  Filled 2021-05-22 (×4): qty 1
  Filled 2021-05-22 (×4): qty 2
  Filled 2021-05-22 (×2): qty 1
  Filled 2021-05-22: qty 2
  Filled 2021-05-22: qty 1
  Filled 2021-05-22: qty 2
  Filled 2021-05-22: qty 1
  Filled 2021-05-22: qty 2
  Filled 2021-05-22: qty 1
  Filled 2021-05-22 (×4): qty 2
  Filled 2021-05-22: qty 1
  Filled 2021-05-22: qty 2

## 2021-05-22 MED ORDER — ORAL CARE MOUTH RINSE: 15.0000 mL | Freq: Once | OROMUCOSAL | Status: AC

## 2021-05-22 MED ORDER — BUPIVACAINE LIPOSOME 1.3 % IJ SUSP
INTRAMUSCULAR | Status: DC | PRN
  Administered 2021-05-22: 50 mL

## 2021-05-22 MED ORDER — PROPOFOL 10 MG/ML IV BOLUS
INTRAVENOUS | Status: AC
  Filled 2021-05-22: qty 20

## 2021-05-22 MED ORDER — SUCCINYLCHOLINE CHLORIDE 200 MG/10ML IV SOSY
PREFILLED_SYRINGE | INTRAVENOUS | Status: DC | PRN
  Administered 2021-05-22: 120 mg via INTRAVENOUS

## 2021-05-22 MED ORDER — PROPOFOL 10 MG/ML IV BOLUS
INTRAVENOUS | Status: DC | PRN
Start: 2021-05-22 — End: 2021-05-22
  Administered 2021-05-22: 200 mg via INTRAVENOUS

## 2021-05-22 MED ORDER — PHENYLEPHRINE 40 MCG/ML (10ML) SYRINGE FOR IV PUSH (FOR BLOOD PRESSURE SUPPORT)
PREFILLED_SYRINGE | INTRAVENOUS | Status: AC
  Filled 2021-05-22: qty 10

## 2021-05-22 MED ORDER — DEXMEDETOMIDINE HCL IN NACL 400 MCG/100ML IV SOLN
0.4000 ug/kg/h | INTRAVENOUS | Status: DC
  Administered 2021-05-22: 0.4 ug/kg/h via INTRAVENOUS
  Filled 2021-05-22 (×3): qty 100

## 2021-05-22 SURGICAL SUPPLY — 101 items
ADH SKN CLS APL DERMABOND .7 (GAUZE/BANDAGES/DRESSINGS) ×1
APL PRP STRL LF DISP 70% ISPRP (MISCELLANEOUS) ×1
BAG SPEC RTRVL C125 8X14 (MISCELLANEOUS) ×2
BLADE CLIPPER SURG (BLADE) ×2 IMPLANT
CANISTER SUCT 3000ML PPV (MISCELLANEOUS) ×4 IMPLANT
CANNULA REDUC XI 12-8 STAPL (CANNULA) ×4
CANNULA REDUCER 12-8 DVNC XI (CANNULA) ×2 IMPLANT
CANNULA VESSEL 3MM BLUNT TIP (CANNULA) ×1 IMPLANT
CATH THORACIC 28FR (CATHETERS) ×1 IMPLANT
CATH TROCAR 20FR (CATHETERS) IMPLANT
CHLORAPREP W/TINT 26 (MISCELLANEOUS) ×2 IMPLANT
CNTNR URN SCR LID CUP LEK RST (MISCELLANEOUS) ×5 IMPLANT
CONN ST 1/4X3/8  BEN (MISCELLANEOUS)
CONN ST 1/4X3/8 BEN (MISCELLANEOUS) IMPLANT
CONT SPEC 4OZ STRL OR WHT (MISCELLANEOUS) ×12
COVER MAYO STAND STRL (DRAPES) ×1 IMPLANT
DEFOGGER SCOPE WARMER CLEARIFY (MISCELLANEOUS) ×2 IMPLANT
DERMABOND ADVANCED (GAUZE/BANDAGES/DRESSINGS) ×1
DERMABOND ADVANCED .7 DNX12 (GAUZE/BANDAGES/DRESSINGS) ×1 IMPLANT
DRAIN CHANNEL 19F RND (DRAIN) ×1 IMPLANT
DRAIN CHANNEL 28F RND 3/8 FF (WOUND CARE) IMPLANT
DRAIN CHANNEL 32F RND 10.7 FF (WOUND CARE) IMPLANT
DRAPE ARM DVNC X/XI (DISPOSABLE) ×4 IMPLANT
DRAPE COLUMN DVNC XI (DISPOSABLE) ×1 IMPLANT
DRAPE CV SPLIT W-CLR ANES SCRN (DRAPES) ×2 IMPLANT
DRAPE DA VINCI XI ARM (DISPOSABLE) ×8
DRAPE DA VINCI XI COLUMN (DISPOSABLE) ×2
DRAPE HALF SHEET 40X57 (DRAPES) ×2 IMPLANT
DRAPE INCISE IOBAN 66X45 STRL (DRAPES) ×1 IMPLANT
DRAPE ORTHO SPLIT 77X108 STRL (DRAPES) ×2
DRAPE SURG ORHT 6 SPLT 77X108 (DRAPES) ×1 IMPLANT
ELECT BLADE 6.5 EXT (BLADE) IMPLANT
ELECT REM PT RETURN 9FT ADLT (ELECTROSURGICAL) ×2
ELECTRODE REM PT RTRN 9FT ADLT (ELECTROSURGICAL) ×1 IMPLANT
GAUZE 4X4 16PLY ~~LOC~~+RFID DBL (SPONGE) ×2 IMPLANT
GAUZE KITTNER 4X5 RF (MISCELLANEOUS) ×2 IMPLANT
GAUZE SPONGE 4X4 12PLY STRL (GAUZE/BANDAGES/DRESSINGS) ×2 IMPLANT
GAUZE SPONGE 4X4 12PLY STRL LF (GAUZE/BANDAGES/DRESSINGS) ×1 IMPLANT
GLOVE SURG ENC MOIS LTX SZ7.5 (GLOVE) ×4 IMPLANT
GOWN STRL REUS W/ TWL LRG LVL3 (GOWN DISPOSABLE) ×2 IMPLANT
GOWN STRL REUS W/ TWL XL LVL3 (GOWN DISPOSABLE) ×3 IMPLANT
GOWN STRL REUS W/TWL 2XL LVL3 (GOWN DISPOSABLE) ×2 IMPLANT
GOWN STRL REUS W/TWL LRG LVL3 (GOWN DISPOSABLE) ×6
GOWN STRL REUS W/TWL XL LVL3 (GOWN DISPOSABLE) ×6
HEMOSTAT SURGICEL 2X14 (HEMOSTASIS) ×3 IMPLANT
IRRIGATOR SUCT 8 DISP DVNC XI (IRRIGATION / IRRIGATOR) IMPLANT
IRRIGATOR SUCTION 8MM XI DISP (IRRIGATION / IRRIGATOR) ×2
IV NS 1000ML (IV SOLUTION) ×2
IV NS 1000ML BAXH (IV SOLUTION) IMPLANT
KIT BASIN OR (CUSTOM PROCEDURE TRAY) ×2 IMPLANT
KIT SUCTION CATH 14FR (SUCTIONS) IMPLANT
KIT TURNOVER KIT B (KITS) ×2 IMPLANT
NEEDLE 22X1 1/2 (OR ONLY) (NEEDLE) ×2 IMPLANT
NS IRRIG 1000ML POUR BTL (IV SOLUTION) ×5 IMPLANT
OBTURATOR OPTICAL STANDARD 8MM (TROCAR)
OBTURATOR OPTICAL STND 8 DVNC (TROCAR)
OBTURATOR OPTICALSTD 8 DVNC (TROCAR) IMPLANT
PACK CHEST (CUSTOM PROCEDURE TRAY) ×2 IMPLANT
PAD ARMBOARD 7.5X6 YLW CONV (MISCELLANEOUS) ×10 IMPLANT
PORT ACCESS TROCAR AIRSEAL 12 (TROCAR) ×1 IMPLANT
PORT ACCESS TROCAR AIRSEAL 5M (TROCAR) ×1
SEAL CANN UNIV 5-8 DVNC XI (MISCELLANEOUS) ×2 IMPLANT
SEAL XI 5MM-8MM UNIVERSAL (MISCELLANEOUS) ×6
SET TRI-LUMEN FLTR TB AIRSEAL (TUBING) ×2 IMPLANT
SOLUTION ELECTROLUBE (MISCELLANEOUS) ×1 IMPLANT
SPONGE INTESTINAL PEANUT (DISPOSABLE) IMPLANT
SPONGE T-LAP 18X18 ~~LOC~~+RFID (SPONGE) ×8 IMPLANT
STAPLER CANNULA SEAL DVNC XI (STAPLE) ×2 IMPLANT
STAPLER CANNULA SEAL XI (STAPLE) ×4
STOPCOCK 4 WAY LG BORE MALE ST (IV SETS) ×2 IMPLANT
SUT ETHILON 3 0 PS 1 (SUTURE) ×1 IMPLANT
SUT MON AB 2-0 CT1 36 (SUTURE) IMPLANT
SUT PDS AB 1 CTX 36 (SUTURE) IMPLANT
SUT PROLENE 4 0 RB 1 (SUTURE)
SUT PROLENE 4-0 RB1 .5 CRCL 36 (SUTURE) IMPLANT
SUT SILK  1 MH (SUTURE) ×4
SUT SILK 1 MH (SUTURE) ×1 IMPLANT
SUT SILK 1 TIES 10X30 (SUTURE) IMPLANT
SUT SILK 2 0 SH (SUTURE) IMPLANT
SUT SILK 2 0SH CR/8 30 (SUTURE) IMPLANT
SUT VIC AB 1 CTX 36 (SUTURE)
SUT VIC AB 1 CTX36XBRD ANBCTR (SUTURE) IMPLANT
SUT VIC AB 2-0 CT1 27 (SUTURE) ×6
SUT VIC AB 2-0 CT1 TAPERPNT 27 (SUTURE) ×1 IMPLANT
SUT VIC AB 3-0 SH 27 (SUTURE) ×4
SUT VIC AB 3-0 SH 27X BRD (SUTURE) ×3 IMPLANT
SUT VICRYL 0 TIES 12 18 (SUTURE) ×2 IMPLANT
SUT VICRYL 0 UR6 27IN ABS (SUTURE) ×4 IMPLANT
SUT VICRYL 2 TP 1 (SUTURE) IMPLANT
SYR 10ML LL (SYRINGE) ×2 IMPLANT
SYR 20ML LL LF (SYRINGE) ×2 IMPLANT
SYR 50ML LL SCALE MARK (SYRINGE) ×2 IMPLANT
SYSTEM RETRIEVAL ANCHOR 8 (MISCELLANEOUS) ×2 IMPLANT
SYSTEM SAHARA CHEST DRAIN ATS (WOUND CARE) ×2 IMPLANT
TAPE CLOTH 4X10 WHT NS (GAUZE/BANDAGES/DRESSINGS) ×2 IMPLANT
TIP APPLICATOR SPRAY EXTEND 16 (VASCULAR PRODUCTS) IMPLANT
TOWEL GREEN STERILE (TOWEL DISPOSABLE) ×2 IMPLANT
TRAY FOLEY MTR SLVR 16FR STAT (SET/KITS/TRAYS/PACK) ×2 IMPLANT
TROCAR BLADELESS 15MM (ENDOMECHANICALS) IMPLANT
TUBING EXTENTION W/L.L. (IV SETS) ×2 IMPLANT
WATER STERILE IRR 1000ML POUR (IV SOLUTION) ×2 IMPLANT

## 2021-05-22 NOTE — Discharge Summary (Cosign Needed)
Physician Discharge Summary  Patient ID: Rhonda Colon MRN: [id number] DOB/AGE: 48/31/1977 48 y.o.  Admit date: 05/16/2021 Discharge date: 06/05/2021  Admission Diagnoses:  Patient Active Problem List   Diagnosis Date Noted   Pleural effusion 05/17/2021   Sepsis due to intrathoracic abscess (HCC) 05/16/2021   Diaphragmatic hernia 02/24/2021   Vulvovaginitis 01/31/2021   Dyslipidemia 06/16/2020   Other fatigue 08/31/2017   Shortness of breath on exertion 08/31/2017   Essential hypertension 08/31/2017   Depression with anxiety 12/10/2015   Urticaria, solar 08/08/2013   CAD (coronary artery disease) 08/28/2011   Chronic low back pain 07/27/2011   Discharge Diagnoses:   Patient Active Problem List   Diagnosis Date Noted   Pleural effusion 05/17/2021   Sepsis due to intrathoracic abscess (HCC) 05/16/2021   Diaphragmatic hernia 02/24/2021   Vulvovaginitis 01/31/2021   Dyslipidemia 06/16/2020   Other fatigue 08/31/2017   Shortness of breath on exertion 08/31/2017   Essential hypertension 08/31/2017   Depression with anxiety 12/10/2015   Urticaria, solar 08/08/2013   CAD (coronary artery disease) 08/28/2011   Chronic low back pain 07/27/2011   Discharged Condition: stable  History of Present Illness:  Rhonda Colon is a 46 yo female with known history of HTN, HLD, Morbid Obesity, and Depression.  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  She was evaluated by Dr. Cliffton Asters who ultimately offered surgical repair.  This was done via a Robotic Assisted approach on 02/24/2021.  The patient initially did well post operatively.  However on follow up with Dr. Cliffton Asters on 04/04/2021 at which time she complained increased pain in her epigastrium, which developed about 2 weeks prior to presentation.  She also noticed some blood in her rectum which had resolved.  CXR obtained at that time showed recurrence of previously repaired hernia.  It was felt CT scan should be  obtained and showed no recurrence of her hernia.  However there was a large fluid collection felt to be a seroma in her hernia space.  It was recommended she undergo a CT guided drain placement to treat the collection.  This was placed on 04/30/2021 with initial removal of 1L of fluid which improved her symptoms.  She followed up with Interventional radiology on 2/6 at which time her drain output was minimal.  Fluoroscopy of the drain showed minimal residual activity and her drain was removed.  The patient developed fever and increased work of breathing.  She presented to her PCP for workup on 2/17 and after speaking with Dr. Cliffton Asters she was referred to the Emergency Department for evaluation.  She was given an IM dose of Rocephin at her family doctors office prior to arrival.  In the ED the patient states she was experiencing headache, fatigue, and chills.  CXR from her PCP office showed a large fluid collection.  Lab work shows a mild leukocytosis with WBC of 16K.  Cardiothoracic consultation was requested and felt the patient should be admitted for broad spectrum ABX and repeat IR placement to drain collection.  She was evaluated by Dr. Dorris Fetch in the ED.  The patient admitted to a several days of fever, chills, diaphoresis, headache, myalgias, shortness of breath with cough, and mild nausea/vomiting.    Hospital Course:  The patient was started on broad spectrum antibiotics.  She underwent IR placement of drain into the right sided thoracic abscess.  This resulted in removal of 300 cc of purulent fluid.  Cultures were sent and revealed Streptococcus  Mitis.  Sensitivities showed Ceftriaxone would be the best treatment agent.  She underwent PICC line placement.  Her antibiotics were transitioned to Ceftriaxone.   Her leukocytosis improved to normal.  Her blood cultures were negative.  It was felt she would require Robotic Assisted Video Thoracoscopy for treatment of chest abscess.  She was taken to the  operating room on 05/22/2020.  She underwent Right Video Assisted Thoracoscopy with drainage of phlegmon and placement of drainage tubes.  She tolerated the procedure without difficulty, was extubated, and taken to the ICU in stable condition.  She required ICU care due to need for continuous antibiotic irrigation.  Drains were kept in place for a few days.  She was started on gabapentin for neuropathic pain.  She is showing steady progress in regards to increasing activity.  The patient's chest tube output remained low.  Her chest tube was removed without difficulty.  Her blake drain with bulb suction remained in place.  Her leukocytosis has stabilized.  She continues to be afebrile she continues to work on pulmonary toilet and chest x-ray appearance remains fairly stable She returned to the OR on 03/06 in order to undergo an Xi robotic assisted removal of diaphragmatic mesh and repair of diaphragmatic hernia. Harrison Mons drain was removed on 03/08. She was ambulating on room air with good oxygenation. All wounds are clean, dry, and healing without signs of infection. Skin irritation secondary to adhesive continues to improve. She has been tolerating a diet. Good pain control was achieved with Tramadol and Tylenol. Dr. Cliffton Asters also told her she may use NSAID (Ibuprofen etc) as directed also. She has been tolerating a diet and has had multiple bowel movements. She has been ambulating well on room air. All wounds are clean, dry and healing without signs of infection. Per patient request, because of antibiotics, she is requesting a prescription for Diflucan PRN at discharge. We consulted infectious disease regarding further antibiotic recommendation. Ceftriaxone was stopped and she was given IV Unasyn. This will be changed to oral Augmentin at discharge. Patient was going to be discharged in am on 03/10 but decided she wanted to go home today 03/09. As discussed with Dr. Cliffton Asters, will discharge today.  Consults:   Infectious disease  Significant Diagnostic Studies: radiology: CT scan:    IMPRESSION: 1. Reaccumulation of right paramediastinal air fluid collection after removal of drainage catheter. Collection measures 10.1 x 10.4 x 8.2 cm, with surrounding fat stranding and inflammatory change. This appears to be supradiaphragmatic in location. 2. Stranding and small amount of ill-defined fluid in the anterior upper abdomen anterior to the liver, similar to abdominal CT 11 days ago. 3. Aortic atherosclerosis and coronary artery calcifications    Electronically Signed   By: Narda Rutherford M.D.   On: 05/16/2021 21:20  Treatments: surgery:    PATHOLOGY: Final result is pending  Discharge Exam: Blood pressure 131/83, pulse 88, temperature 97.6 F (36.4 C), temperature source Axillary, resp. rate 14, height 5\' 4"  (1.626 m), weight (!) 139.8 kg, SpO2 97 %.  Cardiovascular: RRR Pulmonary: Clear to auscultation bilaterally Abdomen: Soft, non tender, bowel sounds present. Extremities: Trace ankle edema Wounds: Clean and dry.  No e signs of infection. There is still some skin irritation present (from adhesives)   Disposition:  Discharge disposition: 01-Home or Self Care       Allergies as of 06/05/2021       Reactions   Wound Dressing Adhesive Dermatitis, Rash        Medication List  TAKE these medications    acetaminophen 500 MG tablet Commonly known as: TYLENOL Take 500 mg by mouth every 6 (six) hours as needed for moderate pain or fever.   ALPRAZolam 1 MG tablet Commonly known as: XANAX Take 0.5-1 tablets (0.5-1 mg total) by mouth 2 (two) times daily as needed for anxiety.   amoxicillin-clavulanate 875-125 MG tablet Commonly known as: AUGMENTIN Take 1 tablet by mouth 2 (two) times daily for 28 days. Start taking on: June 06, 2021   aspirin 81 MG tablet Take 81 mg by mouth daily.   cetirizine 10 MG tablet Commonly known as: ZYRTEC Take 10 mg by mouth daily as  needed for allergies.   fluconazole 100 MG tablet Commonly known as: DIFLUCAN Take 1 tablet (100 mg total) by mouth daily as needed (patient will take if she develops vaginal yeast infection).   FLUoxetine 40 MG capsule Commonly known as: PROZAC Take 1 capsule (40 mg total) by mouth daily.   hydrocortisone cream 1 % Apply topically 2 (two) times daily as needed for itching.   lisinopril 10 MG tablet Commonly known as: ZESTRIL Take 1 tablet (10 mg total) by mouth daily.   multivitamin capsule Take 1 capsule by mouth daily.   nitroGLYCERIN 0.4 MG SL tablet Commonly known as: NITROSTAT Place 1 tablet (0.4 mg total) under the tongue every 5 (five) minutes as needed for chest pain.   Normal Saline Flush 0.9 % Soln Use 3 to 5 mls to flush intracatheterly 2 times daily   Normal Saline Flush 0.9 % Soln Use as directed   omeprazole 20 MG capsule Commonly known as: PRILOSEC Take 1 capsule by mouth once daily   rosuvastatin 40 MG tablet Commonly known as: CRESTOR Take 1 tablet by mouth once daily   traMADol 50 MG tablet Commonly known as: ULTRAM Take 1 tablet (50 mg total) by mouth every 6 (six) hours as needed for moderate pain.        Follow-up Information     Corliss Skains, MD. Go on 06/13/2021.   Specialty: Cardiothoracic Surgery Why: Appointment is at 10:10 am Contact information: [address] [place] [address] [phone]                 Signed: Elenore Rota  06/05/2021, 4:17 PM

## 2021-05-22 NOTE — Anesthesia Postprocedure Evaluation (Signed)
Anesthesia Post Note  Patient: Rhonda Colon  Procedure(s) Performed: XI ROBOTIC ASSISTED RIGHT VIDEO wTHORASCOPY, UNROOFING OF ABCESS CAVITY, PLACEMENT OF PLEURAL IRRIGATION SYSTEM (Right: Chest) INTERCOSTAL NERVE BLOCK (Right: Chest)     Patient location during evaluation: PACU Anesthesia Type: General Level of consciousness: awake Pain management: pain level controlled Vital Signs Assessment: post-procedure vital signs reviewed and stable Postop Assessment: no apparent nausea or vomiting Anesthetic complications: no   No notable events documented.  Last Vitals:  Vitals:   05/22/21 1800 05/22/21 1900  BP: 106/67 108/63  Pulse: 82 78  Resp: 19 (!) 22  Temp:    SpO2: 95% 92%    Last Pain:  Vitals:   05/22/21 1744  TempSrc:   PainSc: 6                  Jolly Carlini

## 2021-05-22 NOTE — Transfer of Care (Signed)
Immediate Anesthesia Transfer of Care Note  Patient: Rhonda Colon  Procedure(s) Performed: XI ROBOTIC ASSISTED RIGHT VIDEO wTHORASCOPY, UNROOFING OF ABCESS CAVITY, PLACEMENT OF PLEURAL IRRIGATION SYSTEM (Right: Chest) INTERCOSTAL NERVE BLOCK (Right: Chest)  Patient Location: PACU  Anesthesia Type:General  Level of Consciousness: awake, alert  and oriented  Airway & Oxygen Therapy: Patient Spontanous Breathing  Post-op Assessment: Report given to RN  Post vital signs: Reviewed and stable  Last Vitals:  Vitals Value Taken Time  BP 119/56   Temp    Pulse 88   Resp 30   SpO2 93     Last Pain:  Vitals:   05/22/21 1146  TempSrc:   PainSc: 2       Patients Stated Pain Goal: 0 (05/22/21 1146)  Complications: No notable events documented.

## 2021-05-22 NOTE — Anesthesia Procedure Notes (Deleted)
Arterial Line Insertion Start/End2/23/2023 12:15 PM, 05/22/2021 12:30 PM Performed by: Dorris Singh, MD, anesthesiologist  Preanesthetic checklist: patient identified, IV checked, site marked, risks and benefits discussed, surgical consent, monitors and equipment checked, pre-op evaluation and timeout performed Lidocaine 1% used for infiltration and patient sedated radial was placed Catheter size: 20 G Hand hygiene performed , maximum sterile barriers used  and Seldinger technique used Allen's test indicative of satisfactory collateral circulation Attempts: 1 Procedure performed using ultrasound guided technique. Ultrasound Notes:anatomy identified Following insertion, line sutured, dressing applied and Biopatch. Patient tolerated the procedure well with no immediate complications.

## 2021-05-22 NOTE — Op Note (Signed)
° °   °  [address]       [place] [address]             [phone]        05/22/2021  Patient:  Rhonda Colon Pre-Op Dx: right pleural abscess   Post-op Dx:  same Procedure: - Robotic assisted right video thoracoscopy - Unroofing of abscess cavity - placement of pleural irrigation system   Surgeon and Role:      * Avinash Maltos, Lucile Crater, MD - Primary  Assistant: Leretha Pol, PA-C  An experienced assistant was required given the complexity of this surgery and the standard of surgical care. The assistant was needed for exposure, dissection, suctioning, retraction of delicate tissues and sutures, instrument exchange and for overall help during this procedure.    Anesthesia  general EBL:  62ml Blood Administration: none Specimen:  none  Drains: 73 F argyle chest tube in right chest.  17F blake in for irrigation Counts: correct   Indications: 46 yo female s/p Morgagni Hernia repair with mesh in November over 2022 subsequently developed a sterile fluid collection which required drain placement.  After removal she then developed an abscess that was in continuity with the previous mesh.  In preparation for removal of the mesh, she was taken to the OR for a planned unroofing and irrigation of the cavity  Findings: Abscess cavity contained multiple pockets of phlegmon  Operative Technique: After the risks, benefits and alternatives were thoroughly discussed, the patient was brought to the operative theatre.  Anesthesia was induced, and the patient was then placed in a left lateral decubitus position and was prepped and draped in normal sterile fashion.  An appropriate surgical pause was performed, and pre-operative antibiotics were dosed accordingly.  We began by placing our 3 robotic ports along her chest wall to triangulate the abscess cavity.  The robot was then docked and all instruments were passed under direct visualization.    The abscess cavity was then  unroofed.    A 28 F chest tube and 17F blake were with then placed, and we watch the remaining lobes re-expand.  The skin and soft tissue were closed with absorbable suture    The patient tolerated the procedure without any immediate complications, and was transferred to the PACU in stable condition.  Rhonda Colon

## 2021-05-22 NOTE — Anesthesia Preprocedure Evaluation (Addendum)
Anesthesia Evaluation  Patient identified by MRN, date of birth, ID band Patient awake    Reviewed: Allergy & Precautions, NPO status   Airway Mallampati: II  TM Distance: >3 FB     Dental   Pulmonary former smoker,    breath sounds clear to auscultation       Cardiovascular hypertension, + CAD and + Past MI   Rhythm:Regular Rate:Normal     Neuro/Psych PSYCHIATRIC DISORDERS Anxiety  Neuromuscular disease    GI/Hepatic negative GI ROS, Neg liver ROS,   Endo/Other  negative endocrine ROS  Renal/GU negative Renal ROS     Musculoskeletal   Abdominal   Peds  Hematology   Anesthesia Other Findings   Reproductive/Obstetrics                             Anesthesia Physical Anesthesia Plan  ASA: 3  Anesthesia Plan: General   Post-op Pain Management:    Induction: Intravenous  PONV Risk Score and Plan:   Airway Management Planned:   Additional Equipment:   Intra-op Plan:   Post-operative Plan: Extubation in OR  Informed Consent: I have reviewed the patients History and Physical, chart, labs and discussed the procedure including the risks, benefits and alternatives for the proposed anesthesia with the patient or authorized representative who has indicated his/her understanding and acceptance.     Dental advisory given  Plan Discussed with: CRNA and Anesthesiologist  Anesthesia Plan Comments:         Anesthesia Quick Evaluation

## 2021-05-22 NOTE — Progress Notes (Signed)
°   °  [address]       [place] [address]             [phone]       No events  Vitals:   05/22/21 0941 05/22/21 1125  BP: 136/90 127/74  Pulse:  90  Resp:  18  Temp:  98.1 F (36.7 C)  SpO2:  97%   Alert NAD Sinus EWOB  OR today for R RATS decortication  [name]

## 2021-05-22 NOTE — Anesthesia Procedure Notes (Signed)
Arterial Line Insertion Start/End2/23/2023 11:45 AM, 05/22/2021 12:15 PM Performed by: Dorris Singh, MD, anesthesiologist  Preanesthetic checklist: patient identified, IV checked, site marked, risks and benefits discussed, surgical consent, monitors and equipment checked, pre-op evaluation, timeout performed and anesthesia consent Lidocaine 1% used for infiltration and patient sedated radial was placed Catheter size: 22 G Hand hygiene performed  and maximum sterile barriers used   Attempts: 4 Procedure performed without using ultrasound guided technique. Following insertion, Biopatch and dressing applied. Post procedure assessment: normal  Post procedure complications: unsuccessful attempts and second provider assisted. Patient tolerated the procedure well with no immediate complications. Additional procedure comments: SRNA attempt x2 on L side, CRNA attempt x1 on L side, anesthesiologist attempt x1 on R side - successful placement with no difficulties.

## 2021-05-22 NOTE — Progress Notes (Signed)
Patient ID: Rhonda Colon, female   DOB: 1976-03-29, 46 y.o.   MRN: [id number] [facility] Evening Rounds:  Hemodynamically stable  Sats 96% 4L  Chest tube output ok.

## 2021-05-22 NOTE — Anesthesia Procedure Notes (Signed)
Procedure Name: Intubation Date/Time: 05/22/2021 1:12 PM Performed by: Dorie Rank, CRNA Pre-anesthesia Checklist: Patient identified, Emergency Drugs available, Suction available and Patient being monitored Patient Re-evaluated:Patient Re-evaluated prior to induction Oxygen Delivery Method: Circle system utilized Preoxygenation: Pre-oxygenation with 100% oxygen Induction Type: Cricoid Pressure applied, Rapid sequence and IV induction Grade View: Grade II Tube type: Oral Endobronchial tube: Left, Double lumen EBT, EBT position confirmed by fiberoptic bronchoscope, EBT position confirmed by auscultation and Bronchial Blocker placed under direct vision and 37 Fr Number of attempts: 1 Airway Equipment and Method: Stylet and Oral airway Placement Confirmation: ETT inserted through vocal cords under direct vision, positive ETCO2 and breath sounds checked- equal and bilateral Secured at: 29 cm Tube secured with: Tape Dental Injury: Teeth and Oropharynx as per pre-operative assessment  Comments: SRNA DL x1

## 2021-05-22 NOTE — Brief Op Note (Signed)
05/16/2021 - 05/22/2021  8:14 AM  PATIENT:  Rhonda Colon  46 y.o. female  PRE-OPERATIVE DIAGNOSIS:  mediastinal abscess  POST-OPERATIVE DIAGNOSIS:  mediastinal abscess  PROCEDURE:  Procedure(s):  XI ROBOTIC ASSISTED THORASCOPY -Incision and Debridement of Phlegmon  Placement of chest drains  SURGEON:  Surgeon(s) and Role:    * Lightfoot, Eliezer Lofts, MD - Primary  PHYSICIAN ASSISTANT: Mavric Cortright PA-C  ASSISTANTS: none   ANESTHESIA:   general  EBL:  200 mL   BLOOD ADMINISTERED:none  DRAINS:  19 blake and 28 chest tube    LOCAL MEDICATIONS USED:  BUPIVICAINE   SPECIMEN:  Source of Specimen:  Phlegmon sack  DISPOSITION OF SPECIMEN:   Microbiology  COUNTS:  YES  TOURNIQUET:  * No tourniquets in log *  DICTATION: .Dragon Dictation  PLAN OF CARE:  transfer to ICU  PATIENT DISPOSITION:  ICU - extubated and stable.   Delay start of Pharmacological VTE agent (>24hrs) due to surgical blood loss or risk of bleeding: no

## 2021-05-23 ENCOUNTER — Inpatient Hospital Stay (HOSPITAL_COMMUNITY): Payer: 59

## 2021-05-23 ENCOUNTER — Encounter (HOSPITAL_COMMUNITY): Payer: Self-pay | Admitting: Thoracic Surgery (Cardiothoracic Vascular Surgery)

## 2021-05-23 LAB — CBC
HCT: 38.5 % (ref 36.0–46.0)
Hemoglobin: 12.4 g/dL (ref 12.0–15.0)
MCH: 29.2 pg (ref 26.0–34.0)
MCHC: 32.2 g/dL (ref 30.0–36.0)
MCV: 90.6 fL (ref 80.0–100.0)
Platelets: 561 10*3/uL — ABNORMAL HIGH (ref 150–400)
RBC: 4.25 MIL/uL (ref 3.87–5.11)
RDW: 12.7 % (ref 11.5–15.5)
WBC: 12.4 10*3/uL — ABNORMAL HIGH (ref 4.0–10.5)
nRBC: 0 % (ref 0.0–0.2)

## 2021-05-23 LAB — BASIC METABOLIC PANEL
Anion gap: 7 (ref 5–15)
BUN: 6 mg/dL (ref 6–20)
CO2: 26 mmol/L (ref 22–32)
Calcium: 9.1 mg/dL (ref 8.9–10.3)
Chloride: 106 mmol/L (ref 98–111)
Creatinine, Ser: 0.65 mg/dL (ref 0.44–1.00)
GFR, Estimated: >60 mL/min (ref >60)
Glucose, Bld: 135 mg/dL — ABNORMAL HIGH (ref 70–99)
Potassium: 4.2 mmol/L (ref 3.5–5.1)
Sodium: 139 mmol/L (ref 135–145)

## 2021-05-23 MED ORDER — SODIUM CHLORIDE 0.9 % IR SOLN
1000.0000 mL | Status: DC
  Administered 2021-05-23: 1000 mL

## 2021-05-23 MED ORDER — KETOROLAC TROMETHAMINE 15 MG/ML IJ SOLN
30.0000 mg | Freq: Four times a day (QID) | INTRAMUSCULAR | Status: AC
  Administered 2021-05-23 – 2021-05-24 (×5): 30 mg via INTRAVENOUS
  Filled 2021-05-23 (×5): qty 2

## 2021-05-23 MED ORDER — LIDOCAINE 5 % EX PTCH
1.0000 | MEDICATED_PATCH | CUTANEOUS | Status: DC
  Administered 2021-05-23 – 2021-05-29 (×8): 1 via TRANSDERMAL
  Filled 2021-05-23 (×9): qty 1

## 2021-05-23 MED ORDER — SODIUM CHLORIDE 0.9 % IV SOLN: INTRAVENOUS | Status: DC | PRN

## 2021-05-23 NOTE — Progress Notes (Signed)
CT surgery PM rounds  Patient sitting up in chair in good spirits Tolerating regular diet No air leak from chest tube  Blood pressure (!) 150/94, pulse 77, temperature 97.8 F (36.6 C), temperature source Axillary, resp. rate (!) 25, height 5\' 4"  (1.626 m), weight 136.1 kg, SpO2 95 %.

## 2021-05-23 NOTE — Progress Notes (Signed)
° °   °  [address]       [place] [address]             [phone]                 1 Day Post-Op Procedure(s) (LRB): XI ROBOTIC ASSISTED RIGHT VIDEO wTHORASCOPY, UNROOFING OF ABCESS CAVITY, PLACEMENT OF PLEURAL IRRIGATION SYSTEM (Right) INTERCOSTAL NERVE BLOCK (Right)   Events: No events _______________________________________________________________ Vitals: BP 116/76    Pulse 69    Temp 97.6 F (36.4 C) (Oral)    Resp 13    Ht 5\' 4"  (1.626 m)    Wt 136.1 kg    LMP  (LMP Unknown) Comment: No cycle for 8 months   SpO2 97%    BMI 51.49 kg/m  Filed Weights   05/16/21 1611 05/22/21 1125  Weight: (!) 136.1 kg 136.1 kg     - Neuro: alert NAD  - Cardiovascular: sinus  Drips: none.      - Pulm: EWOB.  Clear CT output    ABG    Component Value Date/Time   PHART 7.272 (L) 05/22/2021 1439   PCO2ART 64.2 (H) 05/22/2021 1439   PO2ART 221 (H) 05/22/2021 1439   HCO3 29.6 (H) 05/22/2021 1439   TCO2 32 05/22/2021 1439   O2SAT 100 05/22/2021 1439    - Abd: ND - Extremity: trace edema  .Intake/Output      02/23 0701 02/24 0700 02/24 0701 02/25 0700   P.O.     I.V. (mL/kg) 1670.8 (12.3)    IV Piggyback 250    Total Intake(mL/kg) 1920.8 (14.1)    Urine (mL/kg/hr) 1650 (0.5)    Drains     Stool 0    Blood 200    Chest Tube 715    Total Output 2565    Net -644.2         Stool Occurrence 1 x       _______________________________________________________________ Labs: CBC Latest Ref Rng & Units 05/23/2021 05/22/2021 05/20/2021  WBC 4.0 - 10.5 K/uL 12.4(H) - 6.9  Hemoglobin 12.0 - 15.0 g/dL 12.4 11.9(L) 12.2  Hematocrit 36.0 - 46.0 % 38.5 35.0(L) 37.2  Platelets 150 - 400 K/uL 561(H) - 485(H)   CMP Latest Ref Rng & Units 05/23/2021 05/22/2021 05/22/2021  Glucose 70 - 99 mg/dL 135(H) - 106(H)  BUN 6 - 20 mg/dL 6 - 7  Creatinine 0.44 - 1.00 mg/dL 0.65 - 0.60  Sodium 135 - 145 mmol/L 139 140 139  Potassium 3.5 - 5.1 mmol/L 4.2 3.9 3.8  Chloride 98 -  111 mmol/L 106 - 103  CO2 22 - 32 mmol/L 26 - 28  Calcium 8.9 - 10.3 mg/dL 9.1 - 8.8(L)  Total Protein 6.5 - 8.1 g/dL - - 6.2(L)  Total Bilirubin 0.3 - 1.2 mg/dL - - 0.3  Alkaline Phos 38 - 126 U/L - - 84  AST 15 - 41 U/L - - 68(H)  ALT 0 - 44 U/L - - 60(H)    CXR: PV congestion  _______________________________________________________________  Assessment and Plan: POD 1 s/p R RATS, abscess cavity unroofing  Neuro: adjusting pain medication CV: sinus.  On aspirin Pulm: continue pulm hyg, ambulation Renal: creat stable GI: advancing diet Heme: stable ID: continue abx irrigation.  Will transition to normal salin Endo: BG controlled Dispo: ICU for irrigation system   [name] 05/23/2021 7:35 AM

## 2021-05-24 LAB — CBC
HCT: 36.6 % (ref 36.0–46.0)
Hemoglobin: 11.7 g/dL — ABNORMAL LOW (ref 12.0–15.0)
MCH: 29.5 pg (ref 26.0–34.0)
MCHC: 32 g/dL (ref 30.0–36.0)
MCV: 92.4 fL (ref 80.0–100.0)
Platelets: 508 10*3/uL — ABNORMAL HIGH (ref 150–400)
RBC: 3.96 MIL/uL (ref 3.87–5.11)
RDW: 13.2 % (ref 11.5–15.5)
WBC: 11.3 10*3/uL — ABNORMAL HIGH (ref 4.0–10.5)
nRBC: 0 % (ref 0.0–0.2)

## 2021-05-24 LAB — COMPREHENSIVE METABOLIC PANEL
ALT: 37 U/L (ref 0–44)
AST: 32 U/L (ref 15–41)
Albumin: 2.3 g/dL — ABNORMAL LOW (ref 3.5–5.0)
Alkaline Phosphatase: 73 U/L (ref 38–126)
Anion gap: 10 (ref 5–15)
BUN: 11 mg/dL (ref 6–20)
CO2: 25 mmol/L (ref 22–32)
Calcium: 8.6 mg/dL — ABNORMAL LOW (ref 8.9–10.3)
Chloride: 104 mmol/L (ref 98–111)
Creatinine, Ser: 0.71 mg/dL (ref 0.44–1.00)
GFR, Estimated: >60 mL/min (ref >60)
Glucose, Bld: 123 mg/dL — ABNORMAL HIGH (ref 70–99)
Potassium: 3.6 mmol/L (ref 3.5–5.1)
Sodium: 139 mmol/L (ref 135–145)
Total Bilirubin: 0.2 mg/dL — ABNORMAL LOW (ref 0.3–1.2)
Total Protein: 5.9 g/dL — ABNORMAL LOW (ref 6.5–8.1)

## 2021-05-24 MED ORDER — CLOTRIMAZOLE 1 % VA CREA
1.0000 | TOPICAL_CREAM | Freq: Every day | VAGINAL | Status: DC
  Filled 2021-05-24: qty 45

## 2021-05-24 MED ORDER — POTASSIUM CHLORIDE CRYS ER 20 MEQ PO TBCR
40.0000 meq | EXTENDED_RELEASE_TABLET | Freq: Once | ORAL | Status: AC
  Administered 2021-05-24: 40 meq via ORAL
  Filled 2021-05-24: qty 2

## 2021-05-24 MED ORDER — BOOST PO LIQD
237.0000 mL | Freq: Three times a day (TID) | ORAL | Status: DC
Start: 2021-05-24 — End: 2021-06-05
  Administered 2021-05-24 – 2021-05-27 (×5): 237 mL via ORAL
  Filled 2021-05-24 (×39): qty 237

## 2021-05-24 NOTE — Progress Notes (Signed)
CT surgery PM rounds  Patient had stable day up in chair Chest tube output decreased following DC of irrigation system Pain control still an issue but improving  Blood pressure (!) 107/57, pulse 89, temperature 98.1 F (36.7 C), temperature source Oral, resp. rate 19, height 5\' 4"  (1.626 m), weight 136.1 kg, SpO2 96 %.

## 2021-05-24 NOTE — Progress Notes (Signed)
2 Days Post-Op Procedure(s) (LRB): XI ROBOTIC ASSISTED RIGHT VIDEO wTHORASCOPY, UNROOFING OF ABCESS CAVITY, PLACEMENT OF PLEURAL IRRIGATION SYSTEM (Right) INTERCOSTAL NERVE BLOCK (Right) Subjective: Clear drainage from chest tube JP bulb connected to small JP catheter and saline infusion stopped CXR no change Afeb with normal WBC 11 k  Objective: Vital signs in last 24 hours: Temp:  [97.8 F (36.6 C)-98.1 F (36.7 C)] 98.1 F (36.7 C) (02/25 0721) Pulse Rate:  [76-77] 76 (02/25 0000) Cardiac Rhythm: Normal sinus rhythm (02/25 0600) Resp:  [12-27] 16 (02/25 0600) BP: (95-150)/(52-94) 110/73 (02/25 0600) SpO2:  [89 %-98 %] 98 % (02/25 0600)  Hemodynamic parameters for last 24 hours:    Intake/Output from previous day: 02/24 0701 - 02/25 0700 In: 1026.9 [P.O.:240; I.V.:686.9; IV Piggyback:100] Out: 1850 [Urine:800; Chest Tube:1050] Intake/Output this shift: No intake/output data recorded.  No air leak chest tubes secure  Lab Results: Recent Labs    05/23/21 0431 05/24/21 0604  WBC 12.4* 11.3*  HGB 12.4 11.7*  HCT 38.5 36.6  PLT 561* 508*   BMET:  Recent Labs    05/23/21 0431 05/24/21 0604  NA 139 139  K 4.2 3.6  CL 106 104  CO2 26 25  GLUCOSE 135* 123*  BUN 6 11  CREATININE 0.65 0.71  CALCIUM 9.1 8.6*    PT/INR:  Recent Labs    05/22/21 0610  LABPROT 13.1  INR 1.0   ABG    Component Value Date/Time   PHART 7.272 (L) 05/22/2021 1439   HCO3 29.6 (H) 05/22/2021 1439   TCO2 32 05/22/2021 1439   O2SAT 100 05/22/2021 1439   CBG (last 3)  No results for input(s): GLUCAP in the last 72 hours.  Assessment/Plan: S/P Procedure(s) (LRB): XI ROBOTIC ASSISTED RIGHT VIDEO wTHORASCOPY, UNROOFING OF ABCESS CAVITY, PLACEMENT OF PLEURAL IRRIGATION SYSTEM (Right) INTERCOSTAL NERVE BLOCK (Right) Cont iv antibiotics Nutrition mobility Stop chest irrigation and transfer to 2C   LOS: 8 days    Dahlia Byes 05/24/2021

## 2021-05-24 NOTE — Progress Notes (Signed)
0700 - Report received from [place], [place].  All questions answered.  Safety checks performed.  All lines and drips verified. Hand hygiene performed before/after each pt contact. 83 - [place] noted to be full and exchanged. 0800 - Assessment/Rx. 1055 - Dr. Maren Beach rounded and exchanged infusion for JP drain. 1400 - Pt assisted to bathroom.  Pt bathed with soap and CHG.  Pt given new gown and new linen. 1700 - Pt walked four laps around the unit (~1480 ft). 1900 - Mechele Collin, RN given report.  All questions answered.

## 2021-05-25 ENCOUNTER — Inpatient Hospital Stay (HOSPITAL_COMMUNITY): Payer: 59

## 2021-05-25 LAB — CBC
HCT: 37.8 % (ref 36.0–46.0)
Hemoglobin: 11.9 g/dL — ABNORMAL LOW (ref 12.0–15.0)
MCH: 29.2 pg (ref 26.0–34.0)
MCHC: 31.5 g/dL (ref 30.0–36.0)
MCV: 92.9 fL (ref 80.0–100.0)
Platelets: 537 10*3/uL — ABNORMAL HIGH (ref 150–400)
RBC: 4.07 MIL/uL (ref 3.87–5.11)
RDW: 13.2 % (ref 11.5–15.5)
WBC: 9.4 10*3/uL (ref 4.0–10.5)
nRBC: 0 % (ref 0.0–0.2)

## 2021-05-25 LAB — POTASSIUM: Potassium: 4.1 mmol/L (ref 3.5–5.1)

## 2021-05-25 LAB — MAGNESIUM: Magnesium: 2.1 mg/dL (ref 1.7–2.4)

## 2021-05-25 MED ORDER — GABAPENTIN 600 MG PO TABS
300.0000 mg | ORAL_TABLET | Freq: Two times a day (BID) | ORAL | Status: DC
  Administered 2021-05-25 – 2021-06-05 (×20): 300 mg via ORAL
  Filled 2021-05-25 (×22): qty 1

## 2021-05-25 MED ORDER — POTASSIUM CHLORIDE CRYS ER 20 MEQ PO TBCR
40.0000 meq | EXTENDED_RELEASE_TABLET | Freq: Once | ORAL | Status: AC
Start: 2021-05-25 — End: 2021-05-25
  Administered 2021-05-25: 40 meq via ORAL
  Filled 2021-05-25: qty 2

## 2021-05-25 NOTE — Progress Notes (Signed)
3 Days Post-Op Procedure(s) (LRB): XI ROBOTIC ASSISTED RIGHT VIDEO wTHORASCOPY, UNROOFING OF ABCESS CAVITY, PLACEMENT OF PLEURAL IRRIGATION SYSTEM (Right) INTERCOSTAL NERVE BLOCK (Right) Subjective: Patient feels somewhat better and walked several laps around the ICU hallway Approximately a total of 200 cc of fluid has drained from both drains-we will leave both drains in today.  Chest x-ray shows minimal change no evidence of significant reaccumulation  Patient complaining of severe neuropathic pain related to the drains and will place on low-dose gabapentin as she required a dose of morphine late yesterday  Patient remains afebrile with normal white count, no new data on cultures of pleural fluid  Objective: Vital signs in last 24 hours: Temp:  [97.9 F (36.6 C)-98.8 F (37.1 C)] 97.9 F (36.6 C) (02/26 0734) Pulse Rate:  [78-89] 89 (02/26 0000) Cardiac Rhythm: Normal sinus rhythm (02/26 0400) Resp:  [12-25] 21 (02/26 0400) BP: (100-122)/(57-96) 108/68 (02/26 0400) SpO2:  [88 %-98 %] 88 % (02/26 0400)  Hemodynamic parameters for last 24 hours:    Intake/Output from previous day: 02/25 0701 - 02/26 0700 In: 1764.5 [P.O.:1200; I.V.:64.5; IV Piggyback:100] Out: 2490 [Urine:1800; Chest Tube:690] Intake/Output this shift: No intake/output data recorded.  Lungs with good breath sounds bilaterally Chest drains intact, 1 connected to JP bulb 1 connected to the Pleur-evac  Lab Results: Recent Labs    05/24/21 0604 05/25/21 0329  WBC 11.3* 9.4  HGB 11.7* 11.9*  HCT 36.6 37.8  PLT 508* 537*   BMET:  Recent Labs    05/23/21 0431 05/24/21 0604  NA 139 139  K 4.2 3.6  CL 106 104  CO2 26 25  GLUCOSE 135* 123*  BUN 6 11  CREATININE 0.65 0.71  CALCIUM 9.1 8.6*    PT/INR: No results for input(s): LABPROT, INR in the last 72 hours. ABG    Component Value Date/Time   PHART 7.272 (L) 05/22/2021 1439   HCO3 29.6 (H) 05/22/2021 1439   TCO2 32 05/22/2021 1439   O2SAT 100  05/22/2021 1439   CBG (last 3)  No results for input(s): GLUCAP in the last 72 hours.  Assessment/Plan: S/P Procedure(s) (LRB): XI ROBOTIC ASSISTED RIGHT VIDEO wTHORASCOPY, UNROOFING OF ABCESS CAVITY, PLACEMENT OF PLEURAL IRRIGATION SYSTEM (Right) INTERCOSTAL NERVE BLOCK (Right) Continue chest tube drainage and antibiotics Transfer to progressive care unit 2C Start gabapentin for neuropathic pain   LOS: 9 days    Lovett Sox 05/25/2021

## 2021-05-25 NOTE — Progress Notes (Signed)
0700 - Report received from [place], [place].  All questions answered.  Safety checks performed.   Hand hygiene performed before/after each pt contact. 0800 - Assessment/Rx. 1000 - Pt assisted to/from bathroom.  Pt w/BM and unmeasured UOP.  Pt also bathed with soap and CHG.  Dsg changed on CT site.  Pulse-ox changed. 1130 - Dr. Maren Beach rounded.  1245 - Report called to Aram Beecham RN.  All questions answered. 1330 - PICC dsg changed.  Pt's belongings packed.  Pt assisted to bathroom with an unmeasured UOP. 1355 - Pt elected to walk to 2C.  Pt walked without difficulty or distress to 2C Rm 06.  Bedside skin assessment w/Sarah, RN.

## 2021-05-26 ENCOUNTER — Inpatient Hospital Stay (HOSPITAL_COMMUNITY): Payer: 59

## 2021-05-26 LAB — CBC
HCT: 36.5 % (ref 36.0–46.0)
Hemoglobin: 12 g/dL (ref 12.0–15.0)
MCH: 29.9 pg (ref 26.0–34.0)
MCHC: 32.9 g/dL (ref 30.0–36.0)
MCV: 90.8 fL (ref 80.0–100.0)
Platelets: 519 10*3/uL — ABNORMAL HIGH (ref 150–400)
RBC: 4.02 MIL/uL (ref 3.87–5.11)
RDW: 13.1 % (ref 11.5–15.5)
WBC: 9.6 10*3/uL (ref 4.0–10.5)
nRBC: 0 % (ref 0.0–0.2)

## 2021-05-26 LAB — SURGICAL PATHOLOGY

## 2021-05-26 NOTE — Plan of Care (Signed)

## 2021-05-26 NOTE — Progress Notes (Signed)
° °   °  [address]       [place] [address]             [phone]      4 Days Post-Op Procedure(s) (LRB): XI ROBOTIC ASSISTED RIGHT VIDEO wTHORASCOPY, UNROOFING OF ABCESS CAVITY, PLACEMENT OF PLEURAL IRRIGATION SYSTEM (Right) INTERCOSTAL NERVE BLOCK (Right)  Subjective:  Up in chair, doing very well.... The patient is having more neuropathic pain from chest pain... gabapentin is helping.  + ambulation independently  Objective: Vital signs in last 24 hours: Temp:  [97.9 F (36.6 C)-98.1 F (36.7 C)] 98 F (36.7 C) (02/27 0314) Pulse Rate:  [75-90] 77 (02/27 0314) Cardiac Rhythm: Normal sinus rhythm (02/27 0701) Resp:  [15-20] 17 (02/27 0314) BP: (114-132)/(59-89) 114/74 (02/27 0314) SpO2:  [92 %-99 %] 99 % (02/27 0314)  Intake/Output from previous day: 02/26 0701 - 02/27 0700 In: 343.9 [P.O.:240; I.V.:3.9; IV Piggyback:100] Out: 120 [Drains:40; Chest Tube:80]  General appearance: alert, cooperative, and no distress Heart: regular rate and rhythm Lungs: clear to auscultation bilaterally Abdomen: soft, non-tender; bowel sounds normal; no masses,  no organomegaly Extremities: extremities normal, atraumatic, no cyanosis or edema Wound: clean and dry  Lab Results: Recent Labs    05/25/21 0329 05/26/21 0130  WBC 9.4 9.6  HGB 11.9* 12.0  HCT 37.8 36.5  PLT 537* 519*   BMET:  Recent Labs    05/24/21 0604 05/25/21 0717  NA 139  --   K 3.6 4.1  CL 104  --   CO2 25  --   GLUCOSE 123*  --   BUN 11  --   CREATININE 0.71  --   CALCIUM 8.6*  --     PT/INR: No results for input(s): LABPROT, INR in the last 72 hours. ABG    Component Value Date/Time   PHART 7.272 (L) 05/22/2021 1439   HCO3 29.6 (H) 05/22/2021 1439   TCO2 32 05/22/2021 1439   O2SAT 100 05/22/2021 1439   CBG (last 3)  No results for input(s): GLUCAP in the last 72 hours.  Assessment/Plan: S/P Procedure(s) (LRB): XI ROBOTIC ASSISTED RIGHT VIDEO wTHORASCOPY, UNROOFING OF  ABCESS CAVITY, PLACEMENT OF PLEURAL IRRIGATION SYSTEM (Right) INTERCOSTAL NERVE BLOCK (Right)  CV- NSR, H/O HTN- on Lisinopril Pulm- CT output remains low, will d/c large chest tube as discussed with Dr. Cliffton Asters, will leave chest tube bulb ID-afebrile, no leukocytosis... continue Ceftriaxone Lovenox for DVT Thrombocytosis >500K.. likely related to infection... however if this doesn't improve will need referral to hematology Dispo- patient stable, continue current care   LOS: 10 days    Lowella Dandy, PA-C 05/26/2021

## 2021-05-26 NOTE — Progress Notes (Signed)
Chest tube removed at 1045 without complication. Explained procedure to patient, cut tie stitch, and with another RN in room had the patient take a deep breath and hold, and tube pulled. Instructed patient be on bed rest for next 30 minutes. She had good understanding and tolerated well.

## 2021-05-26 NOTE — Progress Notes (Signed)
Mobility Specialist Progress Note  Mobility Specialist signing off. Pt progressing well and actively walking independently.   Holland Falling Mobility Specialist Phone Number [phone]

## 2021-05-27 MED ORDER — HYDROCORTISONE 1 % EX CREA
TOPICAL_CREAM | Freq: Two times a day (BID) | CUTANEOUS | Status: DC | PRN
Start: 2021-05-27 — End: 2021-06-05
  Administered 2021-05-31: 1 via TOPICAL
  Filled 2021-05-27 (×2): qty 28

## 2021-05-27 NOTE — Plan of Care (Signed)
°  Problem: Clinical Measurements: Goal: Diagnostic test results will improve Outcome: Progressing Goal: Respiratory complications will improve Outcome: Progressing   Problem: Activity: Goal: Risk for activity intolerance will decrease Outcome: Progressing   Problem: Coping: Goal: Level of anxiety will decrease Outcome: Progressing   Problem: Skin Integrity: Goal: Risk for impaired skin integrity will decrease Outcome: Progressing

## 2021-05-27 NOTE — Progress Notes (Signed)
° °   °  [address]       [place] [address]             [phone]      4 Days Post-Op Procedure(s) (LRB): XI ROBOTIC ASSISTED RIGHT VIDEO wTHORASCOPY, UNROOFING OF ABCESS CAVITY, PLACEMENT OF PLEURAL IRRIGATION SYSTEM (Right) INTERCOSTAL NERVE BLOCK (Right)  Subjective:  Up in chair, says she is having much less pain since one of the drains was removed yesterday.  Still having some itching and skin irritation from the adhesive tape adjacent to the tube insertion sites.   Objective: Vital signs in last 24 hours: Temp:  [97.7 F (36.5 C)-98.4 F (36.9 C)] 97.7 F (36.5 C) (02/28 0734) Pulse Rate:  [81-96] 85 (02/28 0400) Cardiac Rhythm: Normal sinus rhythm (02/28 0708) Resp:  [15-24] 17 (02/28 0734) BP: (104-145)/(60-89) 125/74 (02/28 0734) SpO2:  [93 %-99 %] 99 % (02/28 0400)  Intake/Output from previous day: 02/27 0701 - 02/28 0700 In: 67 [P.O.:960] Out: 55 [Drains:55]  General appearance: alert, cooperative, and no distress Heart: regular rate and rhythm Lungs: clear to auscultation bilaterally, good air movement Wound: clean and dry, mild rash at tape contact points near the right pleural drain insertion sites.   Lab Results: Recent Labs    05/25/21 0329 05/26/21 0130  WBC 9.4 9.6  HGB 11.9* 12.0  HCT 37.8 36.5  PLT 537* 519*    BMET:  Recent Labs    05/25/21 0717  K 4.1     PT/INR: No results for input(s): LABPROT, INR in the last 72 hours. ABG    Component Value Date/Time   PHART 7.272 (L) 05/22/2021 1439   HCO3 29.6 (H) 05/22/2021 1439   TCO2 32 05/22/2021 1439   O2SAT 100 05/22/2021 1439   CBG (last 3)  No results for input(s): GLUCAP in the last 72 hours.  Assessment/Plan: S/P Procedure(s) (LRB): XI ROBOTIC ASSISTED RIGHT VIDEO wTHORASCOPY, UNROOFING OF ABCESS CAVITY, PLACEMENT OF PLEURAL IRRIGATION SYSTEM (Right) INTERCOSTAL NERVE BLOCK (Right)  CV- NSR, H/O HTN- on Lisinopril with good control Pulm- the large-bore  chest tube was removed yesterday, a Blake drain remains attached to bulb suction with 36ml cloudy serous dranage fro past 24 hours.  ID-afebrile, no leukocytosis... continue Ceftriaxone, diflucan Continue Lovenox for DVT PPX Thrombocytosis likely related to infection. Plt count is trending down.     LOS: 11 days    Antony Odea, PA-C 05/27/2021

## 2021-05-28 NOTE — Progress Notes (Addendum)
[address]       [place] [address]             [phone]      6 Days Post-Op Procedure(s) (LRB): XI ROBOTIC ASSISTED RIGHT VIDEO wTHORASCOPY, UNROOFING OF ABCESS CAVITY, PLACEMENT OF PLEURAL IRRIGATION SYSTEM (Right) INTERCOSTAL NERVE BLOCK (Right) Subjective: Feels pretty well overall  Objective: Vital signs in last 24 hours: Temp:  [98 F (36.7 C)-98.5 F (36.9 C)] 98.5 F (36.9 C) (03/01 0303) Pulse Rate:  [82-91] 91 (03/01 0303) Cardiac Rhythm: Sinus tachycardia (03/01 0712) Resp:  [15-28] 19 (03/01 0303) BP: (96-135)/(65-82) 116/71 (03/01 0303) SpO2:  [91 %-98 %] 96 % (03/01 0303)  Hemodynamic parameters for last 24 hours:    Intake/Output from previous day: 02/28 0701 - 03/01 0700 In: 360 [P.O.:360] Out: 75 [Drains:75] Intake/Output this shift: No intake/output data recorded.  General appearance: alert, cooperative, and no distress Heart: regular rate and rhythm and tachy Lungs: scattered ronchi Abdomen: obese, non tender Wound: healing well, some adhesive reaction  Lab Results: Recent Labs    05/26/21 0130  WBC 9.6  HGB 12.0  HCT 36.5  PLT 519*   BMET: No results for input(s): NA, K, CL, CO2, GLUCOSE, BUN, CREATININE, CALCIUM in the last 72 hours.  PT/INR: No results for input(s): LABPROT, INR in the last 72 hours. ABG    Component Value Date/Time   PHART 7.272 (L) 05/22/2021 1439   HCO3 29.6 (H) 05/22/2021 1439   TCO2 32 05/22/2021 1439   O2SAT 100 05/22/2021 1439   CBG (last 3)  No results for input(s): GLUCAP in the last 72 hours.  Meds Scheduled Meds:  aspirin EC  81 mg Oral Daily   bisacodyl  10 mg Oral Daily   Chlorhexidine Gluconate Cloth  6 each Topical Daily   clotrimazole  1 Applicatorful Vaginal QHS   enoxaparin (LOVENOX) injection  40 mg Subcutaneous Daily   FLUoxetine  40 mg Oral Daily   gabapentin  300 mg Oral BID   lactose free nutrition  237 mL Oral TID WC   lidocaine  1 patch Transdermal Q24H    lisinopril  10 mg Oral Daily   mouth rinse  15 mL Mouth Rinse BID   pantoprazole  40 mg Oral Daily   rosuvastatin  40 mg Oral QPM   senna-docusate  1 tablet Oral QHS   Continuous Infusions:  sodium chloride Stopped (05/25/21 1018)   cefTRIAXone (ROCEPHIN)  IV 2 g (05/27/21 0905)   sodium chloride irrigation Stopped (05/24/21 1058)   PRN Meds:.sodium chloride, acetaminophen **OR** acetaminophen, ALPRAZolam, clotrimazole, fluconazole, hydrocortisone cream, loratadine, morphine injection, ondansetron (ZOFRAN) IV, oxyCODONE  Xrays No results found. Results for orders placed or performed during the hospital encounter of 05/16/21  Blood culture (routine x 2)     Status: None   Collection Time: 05/16/21  3:21 PM   Specimen: BLOOD  Result Value Ref Range Status   Specimen Description BLOOD SITE NOT SPECIFIED  Final   Special Requests   Final    BOTTLES DRAWN AEROBIC AND ANAEROBIC Blood Culture adequate volume   Culture   Final    NO GROWTH 5 DAYS Performed at [facility], [address] [place], [place] [address]    Report Status 05/21/2021 FINAL  Final  Blood culture (routine x 2)     Status: None   Collection Time: 05/16/21  8:09 PM   Specimen: BLOOD  Result Value Ref Range Status  Specimen Description BLOOD SITE NOT SPECIFIED  Final   Special Requests   Final    BOTTLES DRAWN AEROBIC AND ANAEROBIC Blood Culture adequate volume   Culture   Final    NO GROWTH 5 DAYS Performed at [facility], [address] [place], [place] [address]    Report Status 05/21/2021 FINAL  Final  Resp Panel by RT-PCR (Flu A&B, Covid) Nasopharyngeal Swab     Status: None   Collection Time: 05/16/21 10:36 PM   Specimen: Nasopharyngeal Swab; Nasopharyngeal(NP) swabs in vial transport medium  Result Value Ref Range Status   SARS Coronavirus 2 by RT PCR NEGATIVE NEGATIVE Final    Comment: (NOTE) SARS-CoV-2 target nucleic acids are NOT DETECTED.  The SARS-CoV-2 RNA is generally  detectable in upper respiratory specimens during the acute phase of infection. The lowest concentration of SARS-CoV-2 viral copies this assay can detect is 138 copies/mL. A negative result does not preclude SARS-Cov-2 infection and should not be used as the sole basis for treatment or other patient management decisions. A negative result may occur with  improper specimen collection/handling, submission of specimen other than nasopharyngeal swab, presence of viral mutation(s) within the areas targeted by this assay, and inadequate number of viral copies(<138 copies/mL). A negative result must be combined with clinical observations, patient history, and epidemiological information. The expected result is Negative.  Fact Sheet for Patients:  EntrepreneurPulse.com.au  Fact Sheet for Healthcare Providers:  IncredibleEmployment.be  This test is no t yet approved or cleared by the [place] FDA and  has been authorized for detection and/or diagnosis of SARS-CoV-2 by FDA under an Emergency Use Authorization (EUA). This EUA will remain  in effect (meaning this test can be used) for the duration of the COVID-19 declaration under Section 564(b)(1) of the Act, 21 U.S.C.section 360bbb-3(b)(1), unless the authorization is terminated  or revoked sooner.       Influenza A by PCR NEGATIVE NEGATIVE Final   Influenza B by PCR NEGATIVE NEGATIVE Final    Comment: (NOTE) The Xpert Xpress SARS-CoV-2/FLU/RSV plus assay is intended as an aid in the diagnosis of influenza from Nasopharyngeal swab specimens and should not be used as a sole basis for treatment. Nasal washings and aspirates are unacceptable for Xpert Xpress SARS-CoV-2/FLU/RSV testing.  Fact Sheet for Patients: EntrepreneurPulse.com.au  Fact Sheet for Healthcare Providers: IncredibleEmployment.be  This test is not yet approved or cleared by the [place] FDA  and has been authorized for detection and/or diagnosis of SARS-CoV-2 by FDA under an Emergency Use Authorization (EUA). This EUA will remain in effect (meaning this test can be used) for the duration of the COVID-19 declaration under Section 564(b)(1) of the Act, 21 U.S.C. section 360bbb-3(b)(1), unless the authorization is terminated or revoked.  Performed at [facility], [place] [address] [place], [place] [address]   Aerobic/Anaerobic Culture w Gram Stain (surgical/deep wound)     Status: None   Collection Time: 05/17/21  1:35 PM   Specimen: Abscess  Result Value Ref Range Status   Specimen Description ABSCESS  Final   Special Requests RIGHT LOWER CHEST  Final   Gram Stain   Final    FEW WBC PRESENT,BOTH PMN AND MONONUCLEAR ABUNDANT GRAM POSITIVE COCCI ABUNDANT GRAM POSITIVE RODS    Culture   Final    FEW STREPTOCOCCUS MITIS/ORALIS FEW ACTINOMYCES NEUII Standardized susceptibility testing for this organism is not available. NO ANAEROBES ISOLATED Performed at [facility], [place] [address] [place], [place]  [id number]    Report Status 05/22/2021 FINAL  Final   Organism ID, Bacteria STREPTOCOCCUS MITIS/ORALIS  Final      Susceptibility   Streptococcus mitis/oralis - MIC*    TETRACYCLINE >=16 RESISTANT Resistant     VANCOMYCIN 0.5 SENSITIVE Sensitive     CLINDAMYCIN <=0.25 SENSITIVE Sensitive     PENICILLIN Value in next row Sensitive      SENSITIVE0.06    CEFTRIAXONE Value in next row Sensitive      SENSITIVE0.25    * FEW STREPTOCOCCUS MITIS/ORALIS  Surgical PCR Screen     Status: None   Collection Time: 05/22/21 11:54 AM   Specimen: Nasal Mucosa; Nasal Swab  Result Value Ref Range Status   MRSA, PCR NEGATIVE NEGATIVE Final   Staphylococcus aureus NEGATIVE NEGATIVE Final    Comment: (NOTE) The Xpert SA Assay (FDA approved for NASAL specimens in patients 15 years of age and older), is one component of a comprehensive surveillance program. It is not intended to  diagnose infection nor to guide or monitor treatment. Performed at [facility], [place] [address], [place], [place] [address]         Assessment/Plan: S/P Procedure(s) (LRB): XI ROBOTIC ASSISTED RIGHT VIDEO wTHORASCOPY, UNROOFING OF ABCESS CAVITY, PLACEMENT OF PLEURAL IRRIGATION SYSTEM (Right) INTERCOSTAL NERVE BLOCK (Right)  1 afebrile, VSS S BP 96-135, sinus rhythm, sinus tachy 2 sats good on RA 3 conts ceftriaxone, HP drain 75 cc- keep in place 4 no new labs or CXR's today 5 lovenox for DVT PPx 6 will repeat labs/CXR in am 7 limit tape to paper  LOS: 12 days    John Giovanni PA-C Pager [phone] 05/28/2021

## 2021-05-28 NOTE — Plan of Care (Signed)
?  Problem: Education: ?Goal: Knowledge of General Education information will improve ?Description: Including pain rating scale, medication(s)/side effects and non-pharmacologic comfort measures ?Outcome: Progressing ?  ?Problem: Health Behavior/Discharge Planning: ?Goal: Ability to manage health-related needs will improve ?Outcome: Progressing ?  ?Problem: Clinical Measurements: ?Goal: Ability to maintain clinical measurements within normal limits will improve ?Outcome: Progressing ?Goal: Will remain free from infection ?Outcome: Progressing ?Goal: Diagnostic test results will improve ?Outcome: Progressing ?Goal: Respiratory complications will improve ?Outcome: Progressing ?Goal: Cardiovascular complication will be avoided ?Outcome: Progressing ?  ?Problem: Activity: ?Goal: Risk for activity intolerance will decrease ?Outcome: Progressing ?  ?Problem: Nutrition: ?Goal: Adequate nutrition will be maintained ?Outcome: Progressing ?  ?Problem: Coping: ?Goal: Level of anxiety will decrease ?Outcome: Progressing ?  ?Problem: Elimination: ?Goal: Will not experience complications related to bowel motility ?Outcome: Progressing ?Goal: Will not experience complications related to urinary retention ?Outcome: Progressing ?  ?Problem: Pain Managment: ?Goal: General experience of comfort will improve ?Outcome: Progressing ?  ?Problem: Safety: ?Goal: Ability to remain free from injury will improve ?Outcome: Progressing ?  ?Problem: Skin Integrity: ?Goal: Risk for impaired skin integrity will decrease ?Outcome: Progressing ?  ?Problem: Clinical Measurements: ?Goal: Diagnostic test results will improve ?Outcome: Progressing ?Goal: Signs and symptoms of infection will decrease ?Outcome: Progressing ?  ?Problem: Fluid Volume: ?Goal: Hemodynamic stability will improve ?Outcome: Progressing ?  ?Problem: Respiratory: ?Goal: Ability to maintain adequate ventilation will improve ?Outcome: Progressing ?  ?

## 2021-05-28 NOTE — Plan of Care (Signed)

## 2021-05-29 ENCOUNTER — Inpatient Hospital Stay (HOSPITAL_COMMUNITY): Payer: 59

## 2021-05-29 LAB — CBC WITH DIFFERENTIAL/PLATELET
Abs Immature Granulocytes: 0.03 10*3/uL (ref 0.00–0.07)
Basophils Absolute: 0.1 10*3/uL (ref 0.0–0.1)
Basophils Relative: 1 %
Eosinophils Absolute: 0.3 10*3/uL (ref 0.0–0.5)
Eosinophils Relative: 4 %
HCT: 31.5 % — ABNORMAL LOW (ref 36.0–46.0)
Hemoglobin: 10.1 g/dL — ABNORMAL LOW (ref 12.0–15.0)
Immature Granulocytes: 0 %
Lymphocytes Relative: 37 %
Lymphs Abs: 3.1 10*3/uL (ref 0.7–4.0)
MCH: 29.5 pg (ref 26.0–34.0)
MCHC: 32.1 g/dL (ref 30.0–36.0)
MCV: 92.1 fL (ref 80.0–100.0)
Monocytes Absolute: 0.8 10*3/uL (ref 0.1–1.0)
Monocytes Relative: 9 %
Neutro Abs: 4 10*3/uL (ref 1.7–7.7)
Neutrophils Relative %: 49 %
Platelets: 400 10*3/uL (ref 150–400)
RBC: 3.42 MIL/uL — ABNORMAL LOW (ref 3.87–5.11)
RDW: 13 % (ref 11.5–15.5)
WBC: 8.3 10*3/uL (ref 4.0–10.5)
nRBC: 0 % (ref 0.0–0.2)

## 2021-05-29 LAB — BASIC METABOLIC PANEL
Anion gap: 7 (ref 5–15)
BUN: 5 mg/dL — ABNORMAL LOW (ref 6–20)
CO2: 27 mmol/L (ref 22–32)
Calcium: 8.8 mg/dL — ABNORMAL LOW (ref 8.9–10.3)
Chloride: 102 mmol/L (ref 98–111)
Creatinine, Ser: 0.68 mg/dL (ref 0.44–1.00)
GFR, Estimated: >60 mL/min (ref >60)
Glucose, Bld: 107 mg/dL — ABNORMAL HIGH (ref 70–99)
Potassium: 4.1 mmol/L (ref 3.5–5.1)
Sodium: 136 mmol/L (ref 135–145)

## 2021-05-29 MED ORDER — AQUAPHOR EX OINT
TOPICAL_OINTMENT | Freq: Two times a day (BID) | CUTANEOUS | Status: DC | PRN
  Filled 2021-05-29: qty 50

## 2021-05-29 NOTE — TOC Progression Note (Signed)
Transition of Care (TOC) - Progression Note  ? ? ?Patient Details  ?Name: Rhonda Colon ?MRN: [id number] ?Date of Birth: 1975/10/10 ? ?Transition of Care (TOC) CM/SW Contact  ?Angelita Ingles, RN ?Phone [phone] ? ?05/29/2021, 2:51 PM ? ?Clinical Narrative:    ?TOC continues to follow for any disposition needs. No needs noted at this time.  ? ? ?  ?  ? ?Expected Discharge Plan and Services ?  ?  ?  ?  ?  ?                ?  ?  ?  ?  ?  ?  ?  ?  ?  ?  ? ? ?Social Determinants of Health (SDOH) Interventions ?  ? ?Readmission Risk Interventions ?No flowsheet data found. ? ?

## 2021-05-29 NOTE — Progress Notes (Addendum)
? ?   ?  [address] ?      [place] [address] ?            [phone]   ? ?  7 Days Post-Op Procedure(s) (LRB): ?XI ROBOTIC ASSISTED RIGHT VIDEO wTHORASCOPY, UNROOFING OF ABCESS CAVITY, PLACEMENT OF PLEURAL IRRIGATION SYSTEM (Right) ?INTERCOSTAL NERVE BLOCK (Right) ?Subjective: ?Feels ok overall, lots of questions - Dr Cliffton Asters in room answering them ? ?Objective: ?Vital signs in last 24 hours: ?Temp:  [97.7 ?F (36.5 ?C)-98.1 ?F (36.7 ?C)] 97.9 ?F (36.6 ?C) (03/02 0358) ?Pulse Rate:  [63-98] 95 (03/02 0358) ?Cardiac Rhythm: Normal sinus rhythm (03/02 0708) ?Resp:  [14-20] 14 (03/02 0358) ?BP: (107-131)/(70-83) 131/71 (03/02 0358) ?SpO2:  [92 %-98 %] 92 % (03/02 0358) ? ?Hemodynamic parameters for last 24 hours: ?  ? ?Intake/Output from previous day: ?03/01 0701 - 03/02 0700 ?In: 720 [P.O.:720] ?Out: 170 [Drains:170] ?Intake/Output this shift: ?No intake/output data recorded. ? ?General appearance: alert, cooperative, and no distress ?Heart: regular rate and rhythm ?Lungs: mildly dim in bases ?Abdomen: obese , benign ?Extremities: no edema ?Wound: incis healing well ? ?Lab Results: ?Recent Labs  ?  05/29/21 ?0540  ?WBC 8.3  ?HGB 10.1*  ?HCT 31.5*  ?PLT 400  ? ?BMET:  ?Recent Labs  ?  05/29/21 ?0540  ?NA 136  ?K 4.1  ?CL 102  ?CO2 27  ?GLUCOSE 107*  ?BUN 5*  ?CREATININE 0.68  ?CALCIUM 8.8*  ?  ?PT/INR: No results for input(s): LABPROT, INR in the last 72 hours. ?ABG ?   ?Component Value Date/Time  ? PHART 7.272 (L) 05/22/2021 1439  ? HCO3 29.6 (H) 05/22/2021 1439  ? TCO2 32 05/22/2021 1439  ? O2SAT 100 05/22/2021 1439  ? ?CBG (last 3)  ?No results for input(s): GLUCAP in the last 72 hours. ? ?Meds ?Scheduled Meds: ? aspirin EC  81 mg Oral Daily  ? bisacodyl  10 mg Oral Daily  ? Chlorhexidine Gluconate Cloth  6 each Topical Daily  ? clotrimazole  1 Applicatorful Vaginal QHS  ? enoxaparin (LOVENOX) injection  40 mg Subcutaneous Daily  ? FLUoxetine  40 mg Oral Daily  ? gabapentin  300 mg Oral BID  ?  lactose free nutrition  237 mL Oral TID WC  ? lidocaine  1 patch Transdermal Q24H  ? lisinopril  10 mg Oral Daily  ? mouth rinse  15 mL Mouth Rinse BID  ? pantoprazole  40 mg Oral Daily  ? rosuvastatin  40 mg Oral QPM  ? senna-docusate  1 tablet Oral QHS  ? ?Continuous Infusions: ? sodium chloride Stopped (05/25/21 1018)  ? cefTRIAXone (ROCEPHIN)  IV 2 g (05/28/21 1232)  ? sodium chloride irrigation Stopped (05/24/21 1058)  ? ?PRN Meds:.sodium chloride, acetaminophen **OR** acetaminophen, ALPRAZolam, clotrimazole, fluconazole, hydrocortisone cream, loratadine, morphine injection, ondansetron (ZOFRAN) IV, oxyCODONE ? ?Xrays ?No results found. ? ?Assessment/Plan: ?S/P Procedure(s) (LRB): ?XI ROBOTIC ASSISTED RIGHT VIDEO wTHORASCOPY, UNROOFING OF ABCESS CAVITY, PLACEMENT OF PLEURAL IRRIGATION SYSTEM (Right) ?INTERCOSTAL NERVE BLOCK (Right) ? ?1 afeb, VSS, sinus rhythm ?2 sats good on RA ?3 JP 170 cc recorded- keep in place, voiding well ?4 normal renal fxn ?5 no leukocytosis or left shift ?6 H/H has dropped some 10/31- monitor clinically ?7 thrombocytosis resolved ?8 CXR - stable appearance ?9 OR next monday ? ? LOS: 13 days  ? ? ?Rhonda Clack PA-C ?Pager [phone] ?05/29/2021 ?  ? ?Agree with above ?OR on Monday ? ?Rhonda Colon ? ?

## 2021-05-30 ENCOUNTER — Ambulatory Visit: Payer: 59 | Admitting: Thoracic Surgery (Cardiothoracic Vascular Surgery)

## 2021-05-30 NOTE — Progress Notes (Addendum)
? ?   ?  [address] ?      [name] [address] ?            [phone]   ? ?  ?8 Days Post-Op Procedure(s) (LRB): ?XI ROBOTIC ASSISTED RIGHT VIDEO wTHORASCOPY, UNROOFING OF ABCESS CAVITY, PLACEMENT OF PLEURAL IRRIGATION SYSTEM (Right) ?INTERCOSTAL NERVE BLOCK (Right) ? ?Subjective: ? ?Patient up in chair.  The patient has questions, which were addressed. She is walking w/o issue.  She is tolerating a diet.  Planning for surgery on Monday ? ?Objective: ?Vital signs in last 24 hours: ?Temp:  [97.7 ?F (36.5 ?C)-98.2 ?F (36.8 ?C)] 98.2 ?F (36.8 ?C) (03/03 0409) ?Pulse Rate:  [86-100] 100 (03/03 0409) ?Cardiac Rhythm: Normal sinus rhythm (03/02 1912) ?Resp:  [14-19] 18 (03/03 0409) ?BP: (101-123)/(67-82) 123/75 (03/03 0409) ?SpO2:  [94 %-98 %] 98 % (03/03 0409) ? ?Intake/Output from previous day: ?03/02 0701 - 03/03 0700 ?In: -  ?Out: 3 [Drains:70] ? ?General appearance: alert, cooperative, and no distress ?Heart: regular rate and rhythm ?Lungs: clear to auscultation bilaterally ?Abdomen: soft, non-tender; bowel sounds normal; no masses,  no organomegaly ?Extremities: extremities normal, atraumatic, no cyanosis or edema ?Wound: clean and dr ? ?Lab Results: ?Recent Labs  ?  05/29/21 ?0540  ?WBC 8.3  ?HGB 10.1*  ?HCT 31.5*  ?PLT 400  ? ?BMET:  ?Recent Labs  ?  05/29/21 ?0540  ?NA 136  ?K 4.1  ?CL 102  ?CO2 27  ?GLUCOSE 107*  ?BUN 5*  ?CREATININE 0.68  ?CALCIUM 8.8*  ?  ?PT/INR: No results for input(s): LABPROT, INR in the last 72 hours. ?ABG ?   ?Component Value Date/Time  ? PHART 7.272 (L) 05/22/2021 1439  ? HCO3 29.6 (H) 05/22/2021 1439  ? TCO2 32 05/22/2021 1439  ? O2SAT 100 05/22/2021 1439  ? ?CBG (last 3)  ?No results for input(s): GLUCAP in the last 72 hours. ? ?Assessment/Plan: ?S/P Procedure(s) (LRB): ?XI ROBOTIC ASSISTED RIGHT VIDEO wTHORASCOPY, UNROOFING OF ABCESS CAVITY, PLACEMENT OF PLEURAL IRRIGATION SYSTEM (Right) ?INTERCOSTAL NERVE BLOCK (Right) ? ?CV-hemodynamically stable, H/O HTN- on  Lisinopril ?Pulm- no acute issues, CXR w/o significant effusion ?GI-tolerating a diet ?Okay to shower, order placed ?Ambulation w/o difficulty.Marland Kitchenordered placed ?Dispo- patient stable, continue current care, stop Lovenox and ASA for patient's surgery on Monday, patient doing well ? ? LOS: 14 days  ? ?Ellwood Handler, PA-C ?05/30/2021 ? ?Agree with above ?OR on Monday for mesh removal ? ?Rhonda Colon ? ?

## 2021-05-31 MED ORDER — ENOXAPARIN SODIUM 40 MG/0.4ML IJ SOSY
40.0000 mg | PREFILLED_SYRINGE | Freq: Once | INTRAMUSCULAR | Status: AC
  Administered 2021-05-31: 40 mg via SUBCUTANEOUS
  Filled 2021-05-31: qty 0.4

## 2021-05-31 NOTE — Progress Notes (Signed)
RN noted +2 swelling bilateral feet and ankles. PA notified. ?

## 2021-05-31 NOTE — Plan of Care (Signed)
?  Problem: Clinical Measurements: ?Goal: Ability to maintain clinical measurements within normal limits will improve ?Outcome: Progressing ?Goal: Diagnostic test results will improve ?Outcome: Progressing ?Goal: Respiratory complications will improve ?Outcome: Progressing ?  ?Problem: Clinical Measurements: ?Goal: Diagnostic test results will improve ?Outcome: Progressing ?  ?Problem: Clinical Measurements: ?Goal: Respiratory complications will improve ?Outcome: Progressing ?  ?

## 2021-05-31 NOTE — Progress Notes (Signed)
Pt reported small amount of blood in toilet and unsure if from hemorrhoid.  Pt did not have a bowel movement.  RN assessed rectum and found scant amount of light red blood on rectal area.  RN will continue to monitor. ?

## 2021-05-31 NOTE — Progress Notes (Signed)
? ?   ?  [address] ?      [place] [address] ?            [phone]   ? ?  ?9 Days Post-Op Procedure(s) (LRB): ?XI ROBOTIC ASSISTED RIGHT VIDEO wTHORASCOPY, UNROOFING OF ABCESS CAVITY, PLACEMENT OF PLEURAL IRRIGATION SYSTEM (Right) ?INTERCOSTAL NERVE BLOCK (Right) ? ?Subjective: ? ?Patient up in chair and is in good spirits.  Spent several hours in the hospital atrium yesterday.   She is tolerating a diet.  Planning for surgery on Monday ? ?Objective: ?Vital signs in last 24 hours: ?Temp:  [97.7 ?F (36.5 ?C)-98 ?F (36.7 ?C)] 97.8 ?F (36.6 ?C) (03/04 7681) ?Pulse Rate:  [76-92] 92 (03/04 0835) ?Cardiac Rhythm: Normal sinus rhythm (03/04 0700) ?Resp:  [16-18] 18 (03/04 0835) ?BP: (102-126)/(48-77) 116/77 (03/04 1572) ?SpO2:  [93 %-97 %] 93 % (03/04 0835) ? ?Intake/Output from previous day: ?03/03 0701 - 03/04 0700 ?In: -  ?Out: 45 [Drains:45] ? ?General appearance: alert, cooperative, and no distress ?Heart: regular rate and rhythm ?Lungs: clear to auscultation bilaterally ?Abdomen: soft, non-tender. 57ml serous drainage from the JP drain past 24 hours.  ?Wound: clean and dry. Right infra-mammary skin irritation not as inflamed today. ? ?Lab Results: ?Recent Labs  ?  05/29/21 ?0540  ?WBC 8.3  ?HGB 10.1*  ?HCT 31.5*  ?PLT 400  ? ? ?BMET:  ?Recent Labs  ?  05/29/21 ?0540  ?NA 136  ?K 4.1  ?CL 102  ?CO2 27  ?GLUCOSE 107*  ?BUN 5*  ?CREATININE 0.68  ?CALCIUM 8.8*  ? ?  ?PT/INR: No results for input(s): LABPROT, INR in the last 72 hours. ?ABG ?   ?Component Value Date/Time  ? PHART 7.272 (L) 05/22/2021 1439  ? HCO3 29.6 (H) 05/22/2021 1439  ? TCO2 32 05/22/2021 1439  ? O2SAT 100 05/22/2021 1439  ? ?CBG (last 3)  ?No results for input(s): GLUCAP in the last 72 hours. ? ?Assessment/Plan: ?S/P Procedure(s) (LRB): ?XI ROBOTIC ASSISTED RIGHT VIDEO wTHORASCOPY, UNROOFING OF ABCESS CAVITY, PLACEMENT OF PLEURAL IRRIGATION SYSTEM (Right) ?INTERCOSTAL NERVE BLOCK (Right) ? ?CV-hemodynamically stable, H/O HTN-  on Lisinopril with good control ?Pulm- no acute issues. Comfortable on RA.  ?GI-tolerating a diet ?Dispo- patient stable, continue current care,  ASA discontinued yesterday for patient's surgery on Monday.  Will d/c the Lovenox after tonight's dose.  ? ? LOS: 15 days  ? ?Leary Roca, PA-C ?05/31/2021 ? ? ? ?

## 2021-06-01 NOTE — Plan of Care (Signed)

## 2021-06-01 NOTE — Plan of Care (Signed)
?  Problem: Education: ?Goal: Knowledge of General Education information will improve ?Description: Including pain rating scale, medication(s)/side effects and non-pharmacologic comfort measures ?Outcome: Progressing ?  ?Problem: Activity: ?Goal: Risk for activity intolerance will decrease ?Outcome: Progressing ?  ?Problem: Elimination: ?Goal: Will not experience complications related to bowel motility ?Outcome: Progressing ?Goal: Will not experience complications related to urinary retention ?Outcome: Progressing ?  ?Problem: Respiratory: ?Goal: Ability to maintain adequate ventilation will improve ?Outcome: Progressing ?  ?Problem: Clinical Measurements: ?Goal: Postoperative complications will be avoided or minimized ?Outcome: Progressing ?  ?

## 2021-06-01 NOTE — Progress Notes (Signed)
? ?   ?  [address] ?      [place] [address] ?            [phone]   ? ?  ?9 Days Post-Op Procedure(s) (LRB): ?XI ROBOTIC ASSISTED RIGHT VIDEO wTHORASCOPY, UNROOFING OF ABCESS CAVITY, PLACEMENT OF PLEURAL IRRIGATION SYSTEM (Right) ?INTERCOSTAL NERVE BLOCK (Right) ? ?Subjective: ? ?Patient up in chair and ismore anxious and tearful about the surgery planned for tomorrow.   ?Still having some skin irritation at the EKG patches and due to the tape at the chest tube insertion site.  ? ?Objective: ?Vital signs in last 24 hours: ?Temp:  [97.7 ?F (36.5 ?C)-97.8 ?F (36.6 ?C)] 97.8 ?F (36.6 ?C) (03/05 4081) ?Pulse Rate:  [77-95] 85 (03/05 0357) ?Cardiac Rhythm: Normal sinus rhythm (03/05 0706) ?Resp:  [18-20] 19 (03/05 0711) ?BP: (103-136)/(63-81) 136/81 (03/05 0711) ?SpO2:  [90 %-98 %] 98 % (03/05 0711) ? ?Intake/Output from previous day: ?03/04 0701 - 03/05 0700 ?In: 995 [P.O.:895; IV Piggyback:100] ?Out: 45 [Drains:45] ? ?General appearance: alert, cooperative, anxious about the surgery but trying to remain positive. ?Heart: regular rate and rhythm ?Lungs: breath sounds are clear ?Abdomen: soft, non-tender. 33ml clear, serous drainage from the JP drain past 24 hours.  ?Wound: clean and dry. Right infra-mammary skin irritation not as inflamed today. ?EXTS: mild LE edema.  ? ?Lab Results: ?No results for input(s): WBC, HGB, HCT, PLT in the last 72 hours. ? ?BMET:  ?No results for input(s): NA, K, CL, CO2, GLUCOSE, BUN, CREATININE, CALCIUM in the last 72 hours. ?  ?PT/INR: No results for input(s): LABPROT, INR in the last 72 hours. ?ABG ?   ?Component Value Date/Time  ? PHART 7.272 (L) 05/22/2021 1439  ? HCO3 29.6 (H) 05/22/2021 1439  ? TCO2 32 05/22/2021 1439  ? O2SAT 100 05/22/2021 1439  ? ?CBG (last 3)  ?No results for input(s): GLUCAP in the last 72 hours. ? ?Assessment/Plan: ?S/P Procedure(s) (LRB): ?XI ROBOTIC ASSISTED RIGHT VIDEO wTHORASCOPY, UNROOFING OF ABCESS CAVITY, PLACEMENT OF PLEURAL  IRRIGATION SYSTEM (Right) ?INTERCOSTAL NERVE BLOCK (Right) ? ?CV-hemodynamically stable, H/O HTN- on Lisinopril with good control ?Pulm- no acute issues. Comfortable on RA.  ?GI-tolerating a diet ?Dispo- patient stable, continue current care,  ASA and Lovenox discontinued for patient's surgery tomorrow.   ?5.  Skin rash- minimize use of adhesives, will leave the dressing off of the CT insertion site for now.  ?6.   Anxiety- she is using the PRN Xanax. ? ? LOS: 16 days  ? ?Leary Roca, PA-C ?06/01/2021 ? ? ? ?

## 2021-06-02 ENCOUNTER — Other Ambulatory Visit: Payer: Self-pay

## 2021-06-02 ENCOUNTER — Encounter (HOSPITAL_COMMUNITY)
Admission: EM | Disposition: A | Discharge status: Home / Self Care | Payer: Self-pay | Source: Home / Self Care | Attending: Thoracic Surgery (Cardiothoracic Vascular Surgery)

## 2021-06-02 ENCOUNTER — Inpatient Hospital Stay (HOSPITAL_COMMUNITY): Payer: 59 | Admitting: Certified Registered"

## 2021-06-02 ENCOUNTER — Encounter (HOSPITAL_COMMUNITY): Payer: Self-pay | Admitting: Thoracic Surgery (Cardiothoracic Vascular Surgery)

## 2021-06-02 DIAGNOSIS — I9789 Other postprocedural complications and disorders of the circulatory system, not elsewhere classified: Secondary | ICD-10-CM

## 2021-06-02 HISTORY — PX: DIAPHRAGMATIC HERNIA REPAIR: SHX413

## 2021-06-02 LAB — BASIC METABOLIC PANEL
Anion gap: 9 (ref 5–15)
BUN: 6 mg/dL (ref 6–20)
CO2: 25 mmol/L (ref 22–32)
Calcium: 9.1 mg/dL (ref 8.9–10.3)
Chloride: 104 mmol/L (ref 98–111)
Creatinine, Ser: 0.69 mg/dL (ref 0.44–1.00)
GFR, Estimated: >60 mL/min (ref >60)
Glucose, Bld: 112 mg/dL — ABNORMAL HIGH (ref 70–99)
Potassium: 3.9 mmol/L (ref 3.5–5.1)
Sodium: 138 mmol/L (ref 135–145)

## 2021-06-02 LAB — CBC
HCT: 39.3 % (ref 36.0–46.0)
Hemoglobin: 12.6 g/dL (ref 12.0–15.0)
MCH: 29.3 pg (ref 26.0–34.0)
MCHC: 32.1 g/dL (ref 30.0–36.0)
MCV: 91.4 fL (ref 80.0–100.0)
Platelets: 466 10*3/uL — ABNORMAL HIGH (ref 150–400)
RBC: 4.3 MIL/uL (ref 3.87–5.11)
RDW: 13.3 % (ref 11.5–15.5)
WBC: 9.2 10*3/uL (ref 4.0–10.5)
nRBC: 0 % (ref 0.0–0.2)

## 2021-06-02 LAB — TYPE AND SCREEN
ABO/RH(D): O POS
Antibody Screen: NEGATIVE

## 2021-06-02 LAB — PROTIME-INR
INR: 1 (ref 0.8–1.2)
Prothrombin Time: 13.5 seconds (ref 11.4–15.2)

## 2021-06-02 LAB — MAGNESIUM: Magnesium: 2 mg/dL (ref 1.7–2.4)

## 2021-06-02 SURGERY — XI ROBOTIC ASSISTED REPAIR OF DIAPHRAGMATIC HERNIA
Anesthesia: General | Site: Chest

## 2021-06-02 MED ORDER — KETOROLAC TROMETHAMINE 30 MG/ML IJ SOLN
30.0000 mg | Freq: Three times a day (TID) | INTRAMUSCULAR | Status: AC
  Administered 2021-06-02 – 2021-06-04 (×4): 30 mg via INTRAVENOUS
  Filled 2021-06-02 (×4): qty 1

## 2021-06-02 MED ORDER — ORAL CARE MOUTH RINSE: 15.0000 mL | Freq: Once | OROMUCOSAL | Status: AC

## 2021-06-02 MED ORDER — CHLORHEXIDINE GLUCONATE 0.12 % MT SOLN: 15.0000 mL | Freq: Once | OROMUCOSAL | Status: AC

## 2021-06-02 MED ORDER — MIDAZOLAM HCL 5 MG/5ML IJ SOLN
INTRAMUSCULAR | Status: DC | PRN
  Administered 2021-06-02: 2 mg via INTRAVENOUS

## 2021-06-02 MED ORDER — BUPIVACAINE LIPOSOME 1.3 % IJ SUSP
INTRAMUSCULAR | Status: AC
  Filled 2021-06-02: qty 20

## 2021-06-02 MED ORDER — SUGAMMADEX SODIUM 500 MG/5ML IV SOLN
INTRAVENOUS | Status: AC
  Filled 2021-06-02: qty 5

## 2021-06-02 MED ORDER — 0.9 % SODIUM CHLORIDE (POUR BTL) OPTIME
TOPICAL | Status: DC | PRN
  Administered 2021-06-02: 2000 mL

## 2021-06-02 MED ORDER — MIDAZOLAM HCL 2 MG/2ML IJ SOLN
INTRAMUSCULAR | Status: AC
  Filled 2021-06-02: qty 2

## 2021-06-02 MED ORDER — DEXAMETHASONE SODIUM PHOSPHATE 10 MG/ML IJ SOLN
INTRAMUSCULAR | Status: AC
  Filled 2021-06-02: qty 1

## 2021-06-02 MED ORDER — LACTATED RINGERS IV SOLN: INTRAVENOUS | Status: DC

## 2021-06-02 MED ORDER — LACTATED RINGERS IV SOLN: INTRAVENOUS | Status: DC | PRN

## 2021-06-02 MED ORDER — ROCURONIUM BROMIDE 10 MG/ML (PF) SYRINGE
PREFILLED_SYRINGE | INTRAVENOUS | Status: AC
  Filled 2021-06-02: qty 10

## 2021-06-02 MED ORDER — FENTANYL CITRATE (PF) 250 MCG/5ML IJ SOLN
INTRAMUSCULAR | Status: DC | PRN
  Administered 2021-06-02: 100 ug via INTRAVENOUS

## 2021-06-02 MED ORDER — EPHEDRINE SULFATE-NACL 50-0.9 MG/10ML-% IV SOSY
PREFILLED_SYRINGE | INTRAVENOUS | Status: DC | PRN
  Administered 2021-06-02: 5 mg via INTRAVENOUS

## 2021-06-02 MED ORDER — SUCCINYLCHOLINE CHLORIDE 200 MG/10ML IV SOSY
PREFILLED_SYRINGE | INTRAVENOUS | Status: DC | PRN
  Administered 2021-06-02: 140 mg via INTRAVENOUS

## 2021-06-02 MED ORDER — PHENYLEPHRINE HCL-NACL 20-0.9 MG/250ML-% IV SOLN
INTRAVENOUS | Status: DC | PRN
  Administered 2021-06-02: 40 ug/min via INTRAVENOUS

## 2021-06-02 MED ORDER — FENTANYL CITRATE (PF) 250 MCG/5ML IJ SOLN
INTRAMUSCULAR | Status: AC
  Filled 2021-06-02: qty 5

## 2021-06-02 MED ORDER — ALBUMIN HUMAN 5 % IV SOLN: INTRAVENOUS | Status: DC | PRN

## 2021-06-02 MED ORDER — SUGAMMADEX SODIUM 500 MG/5ML IV SOLN
INTRAVENOUS | Status: DC | PRN
  Administered 2021-06-02: 500 mg via INTRAVENOUS

## 2021-06-02 MED ORDER — PROPOFOL 10 MG/ML IV BOLUS
INTRAVENOUS | Status: DC | PRN
  Administered 2021-06-02: 160 mg via INTRAVENOUS

## 2021-06-02 MED ORDER — PHENYLEPHRINE 40 MCG/ML (10ML) SYRINGE FOR IV PUSH (FOR BLOOD PRESSURE SUPPORT)
PREFILLED_SYRINGE | INTRAVENOUS | Status: DC | PRN
  Administered 2021-06-02: 80 ug via INTRAVENOUS

## 2021-06-02 MED ORDER — BUPIVACAINE HCL (PF) 0.5 % IJ SOLN
INTRAMUSCULAR | Status: AC
  Filled 2021-06-02: qty 30

## 2021-06-02 MED ORDER — DEXAMETHASONE SODIUM PHOSPHATE 10 MG/ML IJ SOLN
INTRAMUSCULAR | Status: DC | PRN
  Administered 2021-06-02: 10 mg via INTRAVENOUS

## 2021-06-02 MED ORDER — ROCURONIUM BROMIDE 10 MG/ML (PF) SYRINGE
PREFILLED_SYRINGE | INTRAVENOUS | Status: DC | PRN
Start: 2021-06-02 — End: 2021-06-02
  Administered 2021-06-02 (×2): 20 mg via INTRAVENOUS
  Administered 2021-06-02: 50 mg via INTRAVENOUS
  Administered 2021-06-02: 20 mg via INTRAVENOUS
  Administered 2021-06-02: 30 mg via INTRAVENOUS

## 2021-06-02 MED ORDER — PROPOFOL 10 MG/ML IV BOLUS
INTRAVENOUS | Status: AC
  Filled 2021-06-02: qty 20

## 2021-06-02 MED ORDER — ONDANSETRON HCL 4 MG/2ML IJ SOLN
INTRAMUSCULAR | Status: AC
  Filled 2021-06-02: qty 2

## 2021-06-02 MED ORDER — PHENYLEPHRINE 40 MCG/ML (10ML) SYRINGE FOR IV PUSH (FOR BLOOD PRESSURE SUPPORT)
PREFILLED_SYRINGE | INTRAVENOUS | Status: AC
  Filled 2021-06-02: qty 20

## 2021-06-02 MED ORDER — TRAMADOL HCL 50 MG PO TABS
50.0000 mg | ORAL_TABLET | Freq: Four times a day (QID) | ORAL | Status: DC | PRN
  Administered 2021-06-03 – 2021-06-05 (×6): 50 mg via ORAL
  Filled 2021-06-02 (×6): qty 1

## 2021-06-02 MED ORDER — CHLORHEXIDINE GLUCONATE 0.12 % MT SOLN
OROMUCOSAL | Status: AC
  Filled 2021-06-02: qty 15

## 2021-06-02 MED ORDER — EPHEDRINE 5 MG/ML INJ
INTRAVENOUS | Status: AC
Start: 2021-06-02 — End: ?
  Filled 2021-06-02: qty 5

## 2021-06-02 MED ORDER — DEXMEDETOMIDINE (PRECEDEX) IN NS 20 MCG/5ML (4 MCG/ML) IV SYRINGE
PREFILLED_SYRINGE | INTRAVENOUS | Status: DC | PRN
  Administered 2021-06-02 (×2): 8 ug via INTRAVENOUS
  Administered 2021-06-02: 4 ug via INTRAVENOUS

## 2021-06-02 MED ORDER — DEXMEDETOMIDINE (PRECEDEX) IN NS 20 MCG/5ML (4 MCG/ML) IV SYRINGE
PREFILLED_SYRINGE | INTRAVENOUS | Status: AC
  Filled 2021-06-02: qty 5

## 2021-06-02 MED ORDER — ONDANSETRON HCL 4 MG/2ML IJ SOLN
INTRAMUSCULAR | Status: DC | PRN
  Administered 2021-06-02: 4 mg via INTRAVENOUS

## 2021-06-02 MED ORDER — CHLORHEXIDINE GLUCONATE 0.12 % MT SOLN
OROMUCOSAL | Status: AC
  Administered 2021-06-02: 15 mL via OROMUCOSAL
  Filled 2021-06-02: qty 15

## 2021-06-02 MED ORDER — BUPIVACAINE LIPOSOME 1.3 % IJ SUSP
INTRAMUSCULAR | Status: DC | PRN
  Administered 2021-06-02: 50 mL

## 2021-06-02 MED ORDER — HYDROMORPHONE HCL 1 MG/ML IJ SOLN: 0.2500 mg | INTRAMUSCULAR | Status: DC | PRN

## 2021-06-02 SURGICAL SUPPLY — 81 items
ADH SKN CLS APL DERMABOND .7 (GAUZE/BANDAGES/DRESSINGS) ×1
BLADE SURG 11 STRL SS (BLADE) ×2 IMPLANT
CANISTER SUCT 3000ML PPV (MISCELLANEOUS) ×4 IMPLANT
CATH THORACIC 28FR (CATHETERS) ×2 IMPLANT
CNTNR URN SCR LID CUP LEK RST (MISCELLANEOUS) IMPLANT
CONT SPEC 4OZ STRL OR WHT (MISCELLANEOUS) ×4
DEFOGGER SCOPE WARMER CLEARIFY (MISCELLANEOUS) ×2 IMPLANT
DERMABOND ADVANCED (GAUZE/BANDAGES/DRESSINGS) ×1
DERMABOND ADVANCED .7 DNX12 (GAUZE/BANDAGES/DRESSINGS) ×2 IMPLANT
DEVICE SUTURE ENDOST 10MM (ENDOMECHANICALS) ×1 IMPLANT
DRAPE ARM DVNC X/XI (DISPOSABLE) ×4 IMPLANT
DRAPE COLUMN DVNC XI (DISPOSABLE) ×1 IMPLANT
DRAPE CV SPLIT W-CLR ANES SCRN (DRAPES) ×2 IMPLANT
DRAPE DA VINCI XI ARM (DISPOSABLE) ×8
DRAPE DA VINCI XI COLUMN (DISPOSABLE) ×2
DRAPE INCISE IOBAN 66X45 STRL (DRAPES) ×1 IMPLANT
DRAPE ORTHO SPLIT 77X108 STRL (DRAPES) ×2
DRAPE SURG ORHT 6 SPLT 77X108 (DRAPES) ×1 IMPLANT
DRSG TEGADERM 4X4.75 (GAUZE/BANDAGES/DRESSINGS) ×2 IMPLANT
ELECT REM PT RETURN 9FT ADLT (ELECTROSURGICAL) ×2
ELECTRODE REM PT RTRN 9FT ADLT (ELECTROSURGICAL) ×1 IMPLANT
FELT TEFLON 1X6 (MISCELLANEOUS) IMPLANT
GAUZE KITTNER 4X8 (MISCELLANEOUS) ×2 IMPLANT
GLOVE SURG ENC MOIS LTX SZ7.5 (GLOVE) ×6 IMPLANT
GLOVE SURG UNDER POLY LF SZ6.5 (GLOVE) ×2 IMPLANT
GOWN STRL REUS W/ TWL LRG LVL3 (GOWN DISPOSABLE) ×1 IMPLANT
GOWN STRL REUS W/ TWL XL LVL3 (GOWN DISPOSABLE) ×2 IMPLANT
GOWN STRL REUS W/TWL 2XL LVL3 (GOWN DISPOSABLE) ×2 IMPLANT
GOWN STRL REUS W/TWL LRG LVL3 (GOWN DISPOSABLE) ×4
GOWN STRL REUS W/TWL XL LVL3 (GOWN DISPOSABLE) ×4
GRAFT MYRIAD 5 LAYER 7X10 (Graft) ×1 IMPLANT
GRASPER SUT TROCAR 14GX15 (MISCELLANEOUS) ×1 IMPLANT
HEMOSTAT SURGICEL 2X14 (HEMOSTASIS) IMPLANT
IV NS 1000ML (IV SOLUTION)
IV NS 1000ML BAXH (IV SOLUTION) IMPLANT
KIT BASIN OR (CUSTOM PROCEDURE TRAY) ×2 IMPLANT
KIT TURNOVER KIT B (KITS) ×2 IMPLANT
MARKER SKIN DUAL TIP RULER LAB (MISCELLANEOUS) IMPLANT
NDL HYPO 25GX1X1/2 BEV (NEEDLE) ×1 IMPLANT
NEEDLE HYPO 25GX1X1/2 BEV (NEEDLE) ×2 IMPLANT
NS IRRIG 1000ML POUR BTL (IV SOLUTION) ×4 IMPLANT
OBTURATOR OPTICAL STANDARD 8MM (TROCAR) ×2
OBTURATOR OPTICAL STND 8 DVNC (TROCAR) ×1
OBTURATOR OPTICALSTD 8 DVNC (TROCAR) IMPLANT
PACK CHEST (CUSTOM PROCEDURE TRAY) ×2 IMPLANT
PAD ARMBOARD 7.5X6 YLW CONV (MISCELLANEOUS) ×4 IMPLANT
PORT ACCESS TROCAR AIRSEAL 12 (TROCAR) ×1 IMPLANT
PORT ACCESS TROCAR AIRSEAL 5M (TROCAR) ×1
SEAL CANN UNIV 5-8 DVNC XI (MISCELLANEOUS) ×4 IMPLANT
SEAL XI 5MM-8MM UNIVERSAL (MISCELLANEOUS) ×8
SEALER SYNCHRO 8 IS4000 DV (MISCELLANEOUS)
SEALER SYNCHRO 8 IS4000 DVNC (MISCELLANEOUS) IMPLANT
SET IRRIG TUBING LAPAROSCOPIC (IRRIGATION / IRRIGATOR) IMPLANT
SET TRI-LUMEN FLTR TB AIRSEAL (TUBING) ×2 IMPLANT
SHEET MEDIUM DRAPE 40X70 STRL (DRAPES) ×2 IMPLANT
SOL ANTI FOG 6CC (MISCELLANEOUS) IMPLANT
SOLUTION ANTI FOG 6CC (MISCELLANEOUS) ×1
SPONGE T-LAP 18X18 ~~LOC~~+RFID (SPONGE) ×5 IMPLANT
STAPLER CANNULA SEAL DVNC XI (STAPLE) ×2 IMPLANT
STAPLER CANNULA SEAL XI (STAPLE) ×4
SUT ETHIBOND 0 36 GRN (SUTURE) ×4 IMPLANT
SUT SILK  1 MH (SUTURE) ×2
SUT SILK 1 MH (SUTURE) ×1 IMPLANT
SUT SURGIDAC NAB ES-9 0 48 120 (SUTURE) ×3 IMPLANT
SUT VIC AB 2-0 CT1 36 (SUTURE) ×1 IMPLANT
SUT VIC AB 3-0 SH 27 (SUTURE) ×4
SUT VIC AB 3-0 SH 27X BRD (SUTURE) IMPLANT
SUT VICRYL 0 AB UR-6 (SUTURE) ×1 IMPLANT
SUT VICRYL 0 UR6 27IN ABS (SUTURE) ×4 IMPLANT
SUT VLOC 180 2-0 6IN GS21 (SUTURE) ×1 IMPLANT
SYR 10ML LL (SYRINGE) ×2 IMPLANT
SYR 50ML LL SCALE MARK (SYRINGE) ×2 IMPLANT
SYSTEM SAHARA CHEST DRAIN ATS (WOUND CARE) ×2 IMPLANT
TOWEL GREEN STERILE (TOWEL DISPOSABLE) ×2 IMPLANT
TOWEL GREEN STERILE FF (TOWEL DISPOSABLE) ×2 IMPLANT
TRAY FOLEY MTR SLVR 14FR STAT (SET/KITS/TRAYS/PACK) ×1 IMPLANT
TRAY FOLEY MTR SLVR 16FR STAT (SET/KITS/TRAYS/PACK) ×2 IMPLANT
TROCAR PORT AIRSEAL 8X120 (TROCAR) ×2 IMPLANT
TROCAR XCEL BLADELESS 5X75MML (TROCAR) ×2 IMPLANT
TROCAR XCEL NON-BLD 5MMX100MML (ENDOMECHANICALS) IMPLANT
WATER STERILE IRR 1000ML POUR (IV SOLUTION) ×4 IMPLANT

## 2021-06-02 NOTE — Brief Op Note (Signed)
06/02/2021 ? ?5:43 PM ? ?PATIENT:  Rhonda Colon  46 y.o. female ? ?PRE-OPERATIVE DIAGNOSIS:  diaphragmatic hernia ? ?POST-OPERATIVE DIAGNOSIS:  diaphragmatic hernia ? ?PROCEDURE:  Procedure(s): ?XI ROBOTIC ASSISTED REMOVAL OF DIAPHRAGMATIC MESH AND REPAIR OF DIAPHRAGMATIC HERNIA (N/A) ? ?SURGEON:  Surgeon(s) and Role: ?   * Corliss Skains, MD - Primary ? ?PHYSICIAN ASSISTANT: Brent Noto PA-C ? ?ASSISTANTS: none  ? ?ANESTHESIA:   general ? ?EBL:  50 ML  ? ?BLOOD ADMINISTERED:none ? ?DRAINS: none  ? ?LOCAL MEDICATIONS USED:  MARCAINE    and OTHER EXPAREL ? ?SPECIMEN:  Source of Specimen:  MESH ? ?DISPOSITION OF SPECIMEN:  PATHOLOGY ? ?COUNTS:  YES ? ?TOURNIQUET:  * No tourniquets in log * ? ?DICTATION: .Dragon Dictation ? ?PLAN OF CARE: Admit to inpatient  ? ?PATIENT DISPOSITION:  PACU - hemodynamically stable. ?  ?Delay start of Pharmacological VTE agent (>24hrs) due to surgical blood loss or risk of bleeding: no ? ?

## 2021-06-02 NOTE — Anesthesia Preprocedure Evaluation (Addendum)
Anesthesia Evaluation  ?Patient identified by MRN, date of birth, ID band ?Patient awake ? ? ? ?Reviewed: ?Allergy & Precautions, H&P , NPO status , Patient's Chart, lab work & pertinent test results ? ?Airway ?Mallampati: III ? ?TM Distance: >3 FB ?Neck ROM: Full ? ? ? Dental ?no notable dental hx. ?(+) Teeth Intact, Dental Advisory Given ?  ?Pulmonary ?neg pulmonary ROS, former smoker,  ?  ?Pulmonary exam normal ?breath sounds clear to auscultation ? ? ? ? ? ? Cardiovascular ?hypertension, Pt. on medications ?+ CAD, + Past MI and + Cardiac Stents  ? ?Rhythm:Regular Rate:Normal ? ? ?  ?Neuro/Psych ?Anxiety Depression negative neurological ROS ?   ? GI/Hepatic ?negative GI ROS, Neg liver ROS,   ?Endo/Other  ?Morbid obesity ? Renal/GU ?negative Renal ROS  ?negative genitourinary ?  ?Musculoskeletal ? ? Abdominal ?  ?Peds ? Hematology ?negative hematology ROS ?(+)   ?Anesthesia Other Findings ? ? Reproductive/Obstetrics ?negative OB ROS ? ?  ? ? ? ? ? ? ? ? ? ? ? ? ? ?  ?  ? ? ? ? ? ? ? ?Anesthesia Physical ?Anesthesia Plan ? ?ASA: 3 ? ?Anesthesia Plan: General  ? ?Post-op Pain Management:   ? ?Induction: Intravenous ? ?PONV Risk Score and Plan: 4 or greater and Ondansetron, Dexamethasone and Midazolam ? ?Airway Management Planned: Oral ETT ? ?Additional Equipment:  ? ?Intra-op Plan:  ? ?Post-operative Plan: Extubation in OR ? ?Informed Consent: I have reviewed the patients History and Physical, chart, labs and discussed the procedure including the risks, benefits and alternatives for the proposed anesthesia with the patient or authorized representative who has indicated his/her understanding and acceptance.  ? ? ? ?Dental advisory given ? ?Plan Discussed with: CRNA ? ?Anesthesia Plan Comments:   ? ? ? ? ? ? ?Anesthesia Quick Evaluation ? ?

## 2021-06-02 NOTE — Anesthesia Procedure Notes (Signed)
Procedure Name: Intubation ?Date/Time: 06/02/2021 2:31 PM ?Performed by: Margarita Rana, CRNA ?Pre-anesthesia Checklist: Patient identified, Patient being monitored, Timeout performed, Emergency Drugs available and Suction available ?Patient Re-evaluated:Patient Re-evaluated prior to induction ?Oxygen Delivery Method: Circle System Utilized ?Preoxygenation: Pre-oxygenation with 100% oxygen ?Induction Type: IV induction ?Ventilation: Mask ventilation without difficulty and Oral airway inserted - appropriate to patient size ?Laryngoscope Size: 3 and Glidescope ?Grade View: Grade I ?Tube type: Oral ?Tube size: 7.5 mm ?Number of attempts: 1 ?Airway Equipment and Method: Video-laryngoscopy ?Placement Confirmation: ETT inserted through vocal cords under direct vision, positive ETCO2 and breath sounds checked- equal and bilateral ?Secured at: 22 cm ?Tube secured with: Tape ?Dental Injury: Teeth and Oropharynx as per pre-operative assessment  ?Difficulty Due To: Difficulty was anticipated and Difficult Airway- due to limited oral opening ? ? ? ? ?

## 2021-06-02 NOTE — Op Note (Signed)
? ?   ?[address] ?      [name] [address] ?            [phone]      ? ? ?06/02/2021 ? ?Patient:  Rhonda Colon ?Pre-Op Dx: History of morganii hernia repair with mesh ?  Pleural abscess with concern for infected mesh ? ?Post-op Dx: Same ?Procedure: ? ?- Robotic assisted laparoscopy ?-Resection of previous prosthetic mesh. ?-Primary repair of diaphragmatic defect with 5 layer myriad mesh buttress ? ? ?Surgeon and Role:   ?   * [name], MD - Primary ? ?Assistant: Evonnie Pat, PA-C  ?An experienced assistant was required given the complexity of this surgery and the standard of surgical care. The assistant was needed for exposure, dissection, suctioning, retraction of delicate tissues and sutures, instrument exchange and for overall help during this procedure.   ?Anesthesia  general ?EBL: 50 ml ?Blood Administration: None ?Specimen: Prosthetic mesh ? ? ?Counts: correct ? ? ?Indications: ?This is a 46 year old female who underwent a morganii hernia repair in November 2022 with prosthetic mesh.  Postoperatively she developed a large pleural effusion in the previous hernia defect.  This was treated with catheter drainage for several weeks.  Following removal of the drain the patient developed fevers and chills and signs of sepsis.  Cross-sectional imaging showed reaccumulation of the previous fluid collection.  Another catheter was placed which was consistent with abscess.  After undergoing a right robotic assisted thoracoscopy for obliteration of the phlegmon and abscess cavity the patient was treated with drainage and antibiotics for a staged abdominal approach and removal of the remaining mesh. ? ?Findings: ?The mesh was well incorporated into the abdominal wall and the diaphragm.  There also was a well contained nodular density underneath the mesh that was also incorporated into the remnant of the falciform ligament.  After completely debriding the mesh and all suture material, there  was a good wall of diaphragmatic muscle.  The 5 layer mesh was folded and then sewn to the inferior surface of the diaphragm to act as a buttress.  Transfascial stitches were then passed through the mesh and pexied to the abdominal wall for closure. ? ?Operative Technique: ?After the risks, benefits and alternatives were thoroughly discussed, the patient was brought to the operative theatre.  Anesthesia was induced, and the patient was then prepped and draped in normal sterile fashion.  An appropriate surgical pause was performed, and pre-operative antibiotics were dosed accordingly. ? ?We began with a 1 cm incision 15 cm caudad from the xiphoid and slightly lateral to the umbilicus.  Using an Optiview we entered the peritoneal space.  The abdomen was then insufflated with CO2.  2 other robotic ports were placed to triangulate the hiatus.  Another 12 mm port was placed in place for an assistant port.  The patient was then placed in steep reverse Trendelenburg.  And then the robot was docked. ? ?The mesh was well incorporated into the abdominal wall and the diaphragm.  There also was a well contained nodular density underneath the mesh that was also incorporated into the remnant of the falciform ligament.  After completely debriding the mesh and all suture material, there was a good wall of diaphragmatic muscle.  The 5 layer mesh was folded and then sewn to the inferior surface of the diaphragm to act as a buttress.  Transfascial stitches were then passed through the mesh and pexied to the abdominal wall for closure. ? ?All  ports were removed under direct visualization.  The skin and soft tissue were closed with absorbable suture   ? ?The patient tolerated the procedure without any immediate complications, and was transferred to the PACU in stable condition. ? ?[name] ? ?

## 2021-06-02 NOTE — Transfer of Care (Signed)
Immediate Anesthesia Transfer of Care Note ? ?Patient: Rhonda Colon ? ?Procedure(s) Performed: XI ROBOTIC ASSISTED REMOVAL OF DIAPHRAGMATIC MESH AND REPAIR OF DIAPHRAGMATIC HERNIA (Chest) ? ?Patient Location: PACU ? ?Anesthesia Type:General ? ?Level of Consciousness: drowsy ? ?Airway & Oxygen Therapy: Patient Spontanous Breathing and Patient connected to face mask oxygen ? ?Post-op Assessment: Report given to RN and Post -op Vital signs reviewed and stable ? ?Post vital signs: Reviewed and stable ? ?Last Vitals:  ?Vitals Value Taken Time  ?BP 103/58 06/02/21 1758  ?Temp    ?Pulse 90 06/02/21 1759  ?Resp 30 06/02/21 1759  ?SpO2 94 % 06/02/21 1759  ?Vitals shown include unvalidated device data. ? ?Last Pain:  ?Vitals:  ? 06/02/21 1212  ?TempSrc: Oral  ?PainSc: 0-No pain  ?   ? ?Patients Stated Pain Goal: 0 (05/31/21 2100) ? ?Complications: No notable events documented. ?

## 2021-06-03 MED ORDER — LISINOPRIL 10 MG PO TABS: 10.0000 mg | ORAL_TABLET | Freq: Every day | ORAL | Status: DC

## 2021-06-03 NOTE — Progress Notes (Addendum)
Agree ? ?   ?[address] ?      [place] [address] ?            [phone]   ? ?  ? ?1 Day Post-Op Procedure(s) (LRB): ?XI ROBOTIC ASSISTED REMOVAL OF DIAPHRAGMATIC MESH AND REPAIR OF DIAPHRAGMATIC HERNIA (N/A) ? ?Subjective: ?Patient had a "horrible night"- a lot of pain and did not sleep. She did not eat but denies nausea or vomiting. ? ?Objective: ?Vital signs in last 24 hours: ?Temp:  [97.6 ?F (36.4 ?C)-98.2 ?F (36.8 ?C)] 97.7 ?F (36.5 ?C) (03/07 0425) ?Pulse Rate:  [87-106] 106 (03/07 0425) ?Cardiac Rhythm: Sinus tachycardia (03/06 2122) ?Resp:  [11-28] 18 (03/07 0425) ?BP: (103-152)/(57-89) 104/57 (03/07 0425) ?SpO2:  [91 %-98 %] 95 % (03/07 0425) ? ?  ? ?Intake/Output from previous day: ?03/06 0701 - 03/07 0700 ?In: 2250 [I.V.:2000; IV Piggyback:250] ?Out: 285 [Urine:220; Drains:45; Blood:20] ? ? ?Physical Exam: ? ?Cardiovascular: RRR ?Pulmonary: Clear to auscultation bilaterally ?Abdomen: Soft, non tender, bowel sounds present. ?Extremities: SCDs in place ?Wounds: Clean and dry.  No erythema or signs of infection. Less skin irritation from adhesive ?Blake drain to bulb suction-scant, clear output ? ?Lab Results: ?CBC: ?Recent Labs  ?  06/02/21 ?0413  ?WBC 9.2  ?HGB 12.6  ?HCT 39.3  ?PLT 466*  ? ?BMET:  ?Recent Labs  ?  06/02/21 ?0413  ?NA 138  ?K 3.9  ?CL 104  ?CO2 25  ?GLUCOSE 112*  ?BUN 6  ?CREATININE 0.69  ?CALCIUM 9.1  ?  ?PT/INR:  ?Recent Labs  ?  06/02/21 ?0413  ?LABPROT 13.5  ?INR 1.0  ? ?ABG:  ?INR: ?Will add last result for INR, ABG once components are confirmed ?Will add last 4 CBG results once components are confirmed ? ?Assessment/Plan: ? ?1. CV - Slightly tachy at times, PVCs. On Lisinopril 10 mg daily and with SBP low 100's, will start in am. ?2.  Pulmonary - On 3 liters of oxygen via [place]. Wean over next few days as able. Check CXR in am. JP drain in place to bulb suction, with scant clear output. ?3. ID-on Ceftriaxone. Remains afebrile. Last WBC 9200. ?4. Check labs in  am ?5. Regarding pain control, Toradol 30 mg IV Q 8, Lidocaine patch, Gabapentin 300 mg bid, Morphine 2 mg IV Q 2 hours PRN, Oxy and Ultram PRN. Will discuss with Dr. Cliffton Asters if should increase Gabapentin. ? Lelon Huh ZimmermanPA-C ?06/03/2021,7:00 AM ?[phone]  ? ?Agree with above. ?Pain better controlled today. ?We will remove chest tube tomorrow. ?We will discuss plans for antibiotic therapy and dispo planning. ? ?Corliss Skains ? ?

## 2021-06-03 NOTE — Anesthesia Postprocedure Evaluation (Signed)
Anesthesia Post Note ? ?Patient: Rhonda Colon ? ?Procedure(s) Performed: XI ROBOTIC ASSISTED REMOVAL OF DIAPHRAGMATIC MESH AND REPAIR OF DIAPHRAGMATIC HERNIA (Chest) ? ?  ? ?Patient location during evaluation: PACU ?Anesthesia Type: General ?Level of consciousness: awake and alert ?Pain management: pain level controlled ?Vital Signs Assessment: post-procedure vital signs reviewed and stable ?Respiratory status: spontaneous breathing, nonlabored ventilation, respiratory function stable and patient connected to nasal cannula oxygen ?Cardiovascular status: blood pressure returned to baseline and stable ?Postop Assessment: no apparent nausea or vomiting ?Anesthetic complications: no ? ? ?No notable events documented. ? ?Last Vitals:  ?Vitals:  ? 06/03/21 0200 06/03/21 0425  ?BP: 112/64 (!) 104/57  ?Pulse: 97 (!) 106  ?Resp: 18 18  ?Temp: 36.7 ?C 36.5 ?C  ?SpO2: 91% 95%  ?  ?Last Pain:  ?Vitals:  ? 06/03/21 0628  ?TempSrc:   ?PainSc: 6   ? ? ?  ?  ?  ?  ?  ?  ? ?Beryle Lathe ? ? ? ? ?

## 2021-06-04 ENCOUNTER — Inpatient Hospital Stay (HOSPITAL_COMMUNITY): Payer: 59

## 2021-06-04 ENCOUNTER — Encounter: Payer: Self-pay | Admitting: Cardiology

## 2021-06-04 LAB — CBC
HCT: 36.2 % (ref 36.0–46.0)
Hemoglobin: 11.5 g/dL — ABNORMAL LOW (ref 12.0–15.0)
MCH: 29.5 pg (ref 26.0–34.0)
MCHC: 31.8 g/dL (ref 30.0–36.0)
MCV: 92.8 fL (ref 80.0–100.0)
Platelets: 380 10*3/uL (ref 150–400)
RBC: 3.9 MIL/uL (ref 3.87–5.11)
RDW: 13.7 % (ref 11.5–15.5)
WBC: 12.8 10*3/uL — ABNORMAL HIGH (ref 4.0–10.5)
nRBC: 0 % (ref 0.0–0.2)

## 2021-06-04 LAB — SURGICAL PATHOLOGY

## 2021-06-04 LAB — BASIC METABOLIC PANEL
Anion gap: 10 (ref 5–15)
BUN: 9 mg/dL (ref 6–20)
CO2: 25 mmol/L (ref 22–32)
Calcium: 8.9 mg/dL (ref 8.9–10.3)
Chloride: 104 mmol/L (ref 98–111)
Creatinine, Ser: 0.71 mg/dL (ref 0.44–1.00)
GFR, Estimated: >60 mL/min (ref >60)
Glucose, Bld: 106 mg/dL — ABNORMAL HIGH (ref 70–99)
Potassium: 3.9 mmol/L (ref 3.5–5.1)
Sodium: 139 mmol/L (ref 135–145)

## 2021-06-04 MED ORDER — ASPIRIN EC 81 MG PO TBEC
81.0000 mg | DELAYED_RELEASE_TABLET | Freq: Every day | ORAL | Status: DC
  Administered 2021-06-05: 09:00:00 81 mg via ORAL
  Filled 2021-06-04: qty 1

## 2021-06-04 MED ORDER — CHLORHEXIDINE GLUCONATE CLOTH 2 % EX PADS
6.0000 | MEDICATED_PAD | Freq: Every day | CUTANEOUS | Status: DC
  Administered 2021-06-05: 09:00:00 6 via TOPICAL

## 2021-06-04 MED ORDER — FUROSEMIDE 10 MG/ML IJ SOLN
40.0000 mg | Freq: Once | INTRAMUSCULAR | Status: AC
  Administered 2021-06-04: 40 mg via INTRAVENOUS
  Filled 2021-06-04: qty 4

## 2021-06-04 MED ORDER — POTASSIUM CHLORIDE CRYS ER 20 MEQ PO TBCR
30.0000 meq | EXTENDED_RELEASE_TABLET | Freq: Once | ORAL | Status: AC
  Administered 2021-06-04: 30 meq via ORAL
  Filled 2021-06-04: qty 1

## 2021-06-04 NOTE — Plan of Care (Signed)
?  Problem: Clinical Measurements: ?Goal: Ability to maintain clinical measurements within normal limits will improve ?Outcome: Progressing ?  ?Problem: Clinical Measurements: ?Goal: Signs and symptoms of infection will decrease ?Outcome: Progressing ?  ?Problem: Education: ?Goal: Required Educational Video(s) ?Outcome: Progressing ?  ?

## 2021-06-04 NOTE — Plan of Care (Signed)
?  Problem: Education: ?Goal: Knowledge of General Education information will improve ?Description: Including pain rating scale, medication(s)/side effects and non-pharmacologic comfort measures ?Outcome: Progressing ?  ?Problem: Health Behavior/Discharge Planning: ?Goal: Ability to manage health-related needs will improve ?Outcome: Progressing ?  ?Problem: Clinical Measurements: ?Goal: Ability to maintain clinical measurements within normal limits will improve ?Outcome: Progressing ?Goal: Will remain free from infection ?Outcome: Progressing ?Goal: Diagnostic test results will improve ?Outcome: Progressing ?Goal: Respiratory complications will improve ?Outcome: Progressing ?Goal: Cardiovascular complication will be avoided ?Outcome: Progressing ?  ?Problem: Activity: ?Goal: Risk for activity intolerance will decrease ?Outcome: Progressing ?  ?Problem: Nutrition: ?Goal: Adequate nutrition will be maintained ?Outcome: Progressing ?  ?Problem: Coping: ?Goal: Level of anxiety will decrease ?Outcome: Progressing ?  ?Problem: Elimination: ?Goal: Will not experience complications related to bowel motility ?Outcome: Progressing ?Goal: Will not experience complications related to urinary retention ?Outcome: Progressing ?  ?Problem: Pain Managment: ?Goal: General experience of comfort will improve ?Outcome: Progressing ?  ?Problem: Safety: ?Goal: Ability to remain free from injury will improve ?Outcome: Progressing ?  ?Problem: Skin Integrity: ?Goal: Risk for impaired skin integrity will decrease ?Outcome: Progressing ?  ?Problem: Fluid Volume: ?Goal: Hemodynamic stability will improve ?Outcome: Progressing ?  ?Problem: Clinical Measurements: ?Goal: Diagnostic test results will improve ?Outcome: Progressing ?Goal: Signs and symptoms of infection will decrease ?Outcome: Progressing ?  ?Problem: Respiratory: ?Goal: Ability to maintain adequate ventilation will improve ?Outcome: Progressing ?  ?Problem: Education: ?Goal:  Required Educational Video(s) ?Outcome: Progressing ?  ?Problem: Clinical Measurements: ?Goal: Postoperative complications will be avoided or minimized ?Outcome: Progressing ?  ?Problem: Skin Integrity: ?Goal: Demonstration of wound healing without infection will improve ?Outcome: Progressing ?  ?

## 2021-06-04 NOTE — Progress Notes (Signed)
Agree ? ?   ?[address] ?      [name] [address] ?            [phone]   ? ?  ? ?2 Days Post-Op Procedure(s) (LRB): ?XI ROBOTIC ASSISTED REMOVAL OF DIAPHRAGMATIC MESH AND REPAIR OF DIAPHRAGMATIC HERNIA (N/A) ? ?Subjective: ?Patient is walking around the unit this am. She had a better day yesterday and slept well last night. ? ?Objective: ?Vital signs in last 24 hours: ?Temp:  [97.6 ?F (36.4 ?C)-98.1 ?F (36.7 ?C)] 97.8 ?F (36.6 ?C) (03/08 0451) ?Pulse Rate:  [79-95] 81 (03/08 0451) ?Cardiac Rhythm: Normal sinus rhythm (03/07 2028) ?Resp:  [15-19] 15 (03/08 0451) ?BP: (102-127)/(67-101) 126/74 (03/08 0451) ?SpO2:  [92 %-99 %] 99 % (03/08 0451) ? ?  ? ?Intake/Output from previous day: ?03/07 0701 - 03/08 0700 ?In: 600 [P.O.:600] ?Out: 0  ? ? ?Physical Exam: ? ?Cardiovascular: RRR ?Pulmonary: Clear to auscultation bilaterally ?Abdomen: Soft, non tender, bowel sounds present. ?Extremities: Mild LE/ankle edema ?Wounds: Clean and dry.  No e signs of infection.  ?Blake drain to bulb suction-scant, clear output ? ?Lab Results: ?CBC: ?Recent Labs  ?  06/02/21 ?0413 06/04/21 ?0502  ?WBC 9.2 12.8*  ?HGB 12.6 11.5*  ?HCT 39.3 36.2  ?PLT 466* 380  ? ? ?BMET:  ?Recent Labs  ?  06/02/21 ?0413 06/04/21 ?0502  ?NA 138 139  ?K 3.9 3.9  ?CL 104 104  ?CO2 25 25  ?GLUCOSE 112* 106*  ?BUN 6 9  ?CREATININE 0.69 0.71  ?CALCIUM 9.1 8.9  ? ?  ?PT/INR:  ?Recent Labs  ?  06/02/21 ?0413  ?LABPROT 13.5  ?INR 1.0  ? ? ?ABG:  ?INR: ?Will add last result for INR, ABG once components are confirmed ?Will add last 4 CBG results once components are confirmed ? ?Assessment/Plan: ? ?1. CV - SR this am. Will restart Lisinopril soon;SBP 120's this am. ?2.  Pulmonary - On room air this am. JP drain in place to bulb suction;50 cc of clear fluid for over 24 hours. CXR this am appears to show bibasilar atelectasis and small pleural effusions. Remove drain. Encourage incentive spirometer. ?3. ID-WBC this am slightly increased to  12,800;likely SIRS. On Ceftriaxone. Remains afebrile.  ?4. Supplement potassium ?5. Will give Lasix once today as with some LE edema ?6. Likely home Friday ? ?Rhonda Beske M ZimmermanPA-C ?06/04/2021,6:56 AM ? ? ? ?

## 2021-06-05 ENCOUNTER — Other Ambulatory Visit (HOSPITAL_COMMUNITY): Payer: Self-pay

## 2021-06-05 DIAGNOSIS — A4189 Other specified sepsis: Secondary | ICD-10-CM | POA: Diagnosis not present

## 2021-06-05 DIAGNOSIS — J869 Pyothorax without fistula: Secondary | ICD-10-CM | POA: Diagnosis not present

## 2021-06-05 MED ORDER — SODIUM CHLORIDE 0.9 % IV SOLN
3.0000 g | Freq: Four times a day (QID) | INTRAVENOUS | Status: DC
  Filled 2021-06-05 (×2): qty 8

## 2021-06-05 MED ORDER — KETOROLAC TROMETHAMINE 30 MG/ML IJ SOLN: 30.0000 mg | Freq: Once | INTRAMUSCULAR | Status: DC

## 2021-06-05 MED ORDER — ALPRAZOLAM 1 MG PO TABS: 0.5000 mg | ORAL_TABLET | Freq: Two times a day (BID) | ORAL | Status: DC | PRN

## 2021-06-05 MED ORDER — AMOXICILLIN-POT CLAVULANATE 875-125 MG PO TABS
1.0000 | ORAL_TABLET | Freq: Two times a day (BID) | ORAL | 0 refills | Status: DC
  Filled 2021-06-05: qty 56, 28d supply, fill #0

## 2021-06-05 MED ORDER — TRAMADOL HCL 50 MG PO TABS
50.0000 mg | ORAL_TABLET | Freq: Four times a day (QID) | ORAL | 0 refills | Status: DC | PRN
  Filled 2021-06-05: qty 28, 7d supply, fill #0

## 2021-06-05 MED ORDER — HYDROCORTISONE 1 % EX CREA: TOPICAL_CREAM | Freq: Two times a day (BID) | CUTANEOUS | 0 refills | Status: DC | PRN

## 2021-06-05 MED ORDER — FLUCONAZOLE 100 MG PO TABS
100.0000 mg | ORAL_TABLET | Freq: Every day | ORAL | 0 refills | Status: DC | PRN
  Filled 2021-06-05: qty 10, 10d supply, fill #0

## 2021-06-05 NOTE — Plan of Care (Signed)
?  Problem: Education: ?Goal: Knowledge of General Education information will improve ?Description: Including pain rating scale, medication(s)/side effects and non-pharmacologic comfort measures ?Outcome: Completed/Met ?  ?Problem: Health Behavior/Discharge Planning: ?Goal: Ability to manage health-related needs will improve ?Outcome: Completed/Met ?  ?Problem: Clinical Measurements: ?Goal: Ability to maintain clinical measurements within normal limits will improve ?Outcome: Completed/Met ?Goal: Will remain free from infection ?Outcome: Completed/Met ?Goal: Diagnostic test results will improve ?Outcome: Completed/Met ?Goal: Respiratory complications will improve ?Outcome: Completed/Met ?Goal: Cardiovascular complication will be avoided ?Outcome: Completed/Met ?  ?

## 2021-06-05 NOTE — Progress Notes (Signed)
Talk to pharmacy at [phone] - [facility] has new medications to go home with at this time.   Pharmacy trying to get prior to patient leaving. ?

## 2021-06-05 NOTE — Progress Notes (Signed)
Patient being discharge tonight per conversation with Dr. Cliffton Asters.  Getting IV team to remove PICC line per MD.  Dr. Cliffton Asters talking patient at this time. ?

## 2021-06-05 NOTE — Progress Notes (Signed)
Agree ? ?   ?[address] ?      [place] [address] ?            [phone]   ? ?  ? ?3 Days Post-Op Procedure(s) (LRB): ?XI ROBOTIC ASSISTED REMOVAL OF DIAPHRAGMATIC MESH AND REPAIR OF DIAPHRAGMATIC HERNIA (N/A) ? ?Subjective: ?Patient was washing up this am so I returned to evaluate her. She did not sleep well last night. ? ?Objective: ?Vital signs in last 24 hours: ?Temp:  [97 ?F (36.1 ?C)-98.7 ?F (37.1 ?C)] 98 ?F (36.7 ?C) (03/09 0450) ?Pulse Rate:  [79-92] 84 (03/09 0450) ?Cardiac Rhythm: Normal sinus rhythm (03/08 1954) ?Resp:  [15-20] 18 (03/09 0450) ?BP: (98-136)/(51-76) 123/76 (03/09 0450) ?SpO2:  [93 %-98 %] 95 % (03/09 0450) ? ?  ? ?Intake/Output from previous day: ?03/08 0701 - 03/09 0700 ?In: 880 [P.O.:880] ?Out: 30 [Drains:30] ? ? ?Physical Exam: ? ?Cardiovascular: RRR ?Pulmonary: Clear to auscultation bilaterally ?Abdomen: Soft, non tender, bowel sounds present. ?Extremities: Trace ankle edema ?Wounds: Clean and dry.  No e signs of infection. There is still some skin irritation present (from adhesives) ? ?Lab Results: ?CBC: ?Recent Labs  ?  06/04/21 ?0502  ?WBC 12.8*  ?HGB 11.5*  ?HCT 36.2  ?PLT 380  ? ? ?BMET:  ?Recent Labs  ?  06/04/21 ?0502  ?NA 139  ?K 3.9  ?CL 104  ?CO2 25  ?GLUCOSE 106*  ?BUN 9  ?CREATININE 0.71  ?CALCIUM 8.9  ? ?  ?PT/INR:  ?No results for input(s): LABPROT, INR in the last 72 hours. ? ?ABG:  ?INR: ?Will add last result for INR, ABG once components are confirmed ?Will add last 4 CBG results once components are confirmed ? ?Assessment/Plan: ? ?1. CV - SR this am. Will restart Lisinopril at discharge;SBP 120's this again this am. ?2.  Pulmonary - On room air this am.  Encourage incentive spirometer. ?3. ID-Last WBC slightly increased to 12,800;likely SIRS. On Ceftriaxone. Remains afebrile. Will ask infectious disease input as to recommendation for oral antibiotic. ?4. Likely home in am ? ?Danzel Marszalek M ZimmermanPA-C ?06/05/2021,7:05 AM ? ? ? ?

## 2021-06-05 NOTE — Progress Notes (Signed)
Medications delivered from [facility]. Discharge instructions given pain decreased upon leaving.  Patient transported via wheelchair by NT and alert to person, place, time and situation.  Husband driving home. ?

## 2021-06-05 NOTE — Consult Note (Signed)
[facility]                                                                                        Patient Identification: Patient Name: Rhonda Colon MRN: [id number] Admit Date: 05/16/2021  3:58 PM Today's Date: 06/05/2021 Reason for consult: pleural abscess, recommendations for abtx Requesting provider: Melodie Bouillon   Principal Problem:   Sepsis due to intrathoracic abscess [facility]) Active Problems:   CAD (coronary artery disease)   Depression with anxiety   Essential hypertension   Dyslipidemia   Pleural effusion   Antibiotics:  Ceftriaxone 2/17 Pip/tazo 2/17- 2/20, ceftriaxone 2/21 - c Vancomycin 2/17-2/20 Fluconazole 2/23   Lines/Hardware:  Assessment Left lung empyema + Infected mesh in the setting of 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 Path with no malignancy identified  Recommendations  Will switch ceftriaxone to Unasyn now Discussed with Dr Kipp Brood, all infected mesh is out and new bioprosthetic material used for primary repair of diaphragmatic defect  OK to switch to augmentin on discharge for 4 weeks  Will consider amoxicillin suppression after course of augmentin given presence of actinomyces from rt chest cultures  Monitor CBC and BMP  Fu in the clinic for monitoring abtx Discussed with patient and Dr Kipp Brood.   Rest of the management as per the primary team. Please call with questions or concerns.   Thank you for the consult  Rosiland Oz, MD Infectious Disease Physician [facility] [address] [place], [place] [address] Phone: [phone]   Fax: [phone]  __________________________________________________________________________________________________________ HPI and Hospital Course: 46 Y O Female with PMH of CAD s/p DES, HLD, HTN, Depression, obesity, ex smoker, s/p Morgagni hernia repair with [employer] Ventralight mesh on 74/16/3845 complicated by large fluid collection in the lower right hemithorax plus small postoperative fluid collection in the upper abdomen requiring CT-guided right subpulmonic drain placement on 04/30/2021 ( Cx Cutibacterium avidium ) which was removed after CT showed interval decrease in size of the fluid collection  with minimal drain output. Referred from PCP ED 2/17 for fevers and SOB/nonproductive cough including nausea after given 1 dose of ceftriaxone. Cxray showed reaccumulation of fluid in the right lower chest with ER-fluid level. CT chest 2/17 with reaccumulation of right paramediastinal air-fluid collection after removal of drainage catheter. Collection measures 10.1 into 10.4 into 8.2 cm with surrounding fat stranding and inflammatory change, appears to be supradiaphragmatic in location.  Stranding and small amount of ill-defined fluid in the anterior upper abdomen anterior to the liver.  Underwent CT-guided right chest drain placement on 2/18 ( cx growing Streptococcus mitis/oralis and actinomyces neuii).  She underwent robotic assisted right video thoracoscopy, unroofing of abscess cavity and placement of pleural irrigation  system.  This was followed by robotic assisted laparoscopy, resection of previous prosthetic mesh and primary repair of diaphragmatic defect with 5 layer Myriad mesh buttress  OR Findings on 3/6  The mesh was well incorporated into the abdominal wall and the diaphragm.  There also  was a well contained nodular density underneath the mesh that was also incorporated into the remnant of the falciform ligament.  After completely debriding the mesh and all suture material, there was a good wall of diaphragmatic muscle.  The 5 layer mesh was folded and then sewn to the inferior surface of the diaphragm to act as a buttress.  Transfascial stitches were then passed through the mesh and pexied to the abdominal wall for closure.  ROS: Denies fevers, chills Denies nausea,vomiting and diarrhea Denies cough, chest pain and SOB Denies GU symptoms  All systems reviewed and negative except as above   Past Medical History:  Diagnosis Date   CAD (coronary artery disease)    a. 07/2011 NSTEMI/Cath: RCA 99%m, otw nl cors, EF 50%, inf hk -> RCA stented with 3.0x53m Promus DES   Depression    Hyperlipidemia    Hypertension    Morgagni hernia    a. Discovered incidentally on CT 07/2011 ->f/u with Dr. WHulen Skains  Myocardial infarction ([facility]    Obesity    Sinus bradycardia    a. Nocturnal, asymptomatic   Sun allergy    Tobacco abuse    a. quit 07/2011   Past Surgical History:  Procedure Laterality Date   CARDIAC CATHETERIZATION  08/10/2011   left heart, with angiogram; DES mid RCA   INTERCOSTAL NERVE BLOCK Right 05/22/2021   Procedure: INTERCOSTAL NERVE BLOCK;  Surgeon: LLajuana Matte MD;  [facility]  Service: Thoracic;  Laterality: Right;   IR RADIOLOGIST EVAL & MGMT  05/05/2021   LEFT HEART CATHETERIZATION WITH CORONARY ANGIOGRAM N/A 08/10/2011   Procedure: LEFT HEART CATHETERIZATION WITH CORONARY ANGIOGRAM;  Surgeon: CBurnell Blanks MD;  [facility];  Service: Cardiovascular;  Laterality: N/A;   TONSILLECTOMY       Scheduled Meds:  aspirin EC  81 mg Oral Daily   bisacodyl  10 mg Oral Daily   Chlorhexidine Gluconate Cloth  6 each Topical Daily   FLUoxetine  40 mg Oral Daily   gabapentin  300 mg Oral BID   ketorolac  30 mg Intravenous Once   lactose  free nutrition  237 mL Oral TID WC   lidocaine  1 patch Transdermal Q24H   pantoprazole  40 mg Oral Daily   rosuvastatin  40 mg Oral QPM   senna-docusate  1 tablet Oral QHS   Continuous Infusions:  cefTRIAXone (ROCEPHIN)  IV Stopped (06/04/21 1055)   PRN Meds:.acetaminophen **OR** acetaminophen, ALPRAZolam, clotrimazole, fluconazole, hydrocortisone cream, loratadine, mineral oil-hydrophilic petrolatum, morphine injection, ondansetron (ZOFRAN) IV, oxyCODONE, traMADol  Allergies  Allergen Reactions   Wound Dressing Adhesive Dermatitis and Rash   Social History   Socioeconomic History   Marital status: Significant Other    Spouse name: Not on file   Number of children: 0   Years of education: Not on file   Highest education level: Not on file  Occupational History   Occupation: Realtor  Tobacco Use   Smoking status: Former    Packs/day: 1.00    Years: 18.00    Pack years: 18.00    Types: Cigarettes    Quit date: 08/09/2011    Years since quitting: 9.8   Smokeless tobacco: Never  Tobacco comments:    2013  Vaping Use   Vaping Use: Never used  Substance and Sexual Activity   Alcohol use: Yes    Alcohol/week: 4.0 standard drinks    Types: 4 Glasses of wine per week    Comment: occassional 4 times aweek    Drug use: No   Sexual activity: Yes    Birth control/protection: None, Pill  Other Topics Concern   Not on file  Social History Narrative   Lives alone.  Occasional EtOH.  Works as a Cabin crew.   Social Determinants of Health   Financial Resource Strain: Not on file  Food Insecurity: Not on file  Transportation Needs: Not on file  Physical Activity: Not on file  Stress: Not on file  Social Connections: Not on file  Intimate Partner Violence: Not on file   Breast Cancer-relatedfamily history includes Breast cancer in her maternal grandmother.  Vitals BP 125/80 (BP Location: Left Arm)    Pulse 88    Temp 97.8 F (36.6 C) (Oral)    Resp (!) 30    Ht 5' 4"  (1.626  m)    Wt (!) 139.8 kg    LMP  (LMP Unknown) Comment: No cycle for 8 months   SpO2 92%    BMI 52.90 kg/m   Physical Exam Constitutional:  Morbidly obese and sitting in the chair after walking     Comments:   Cardiovascular:     Rate and Rhythm: Normal rate and regular rhythm.     Heart sounds:   Pulmonary:     Effort: Pulmonary effort is normal on room air     Comments: bilateral equal breath sounds   Abdominal:     Palpations: Abdomen is soft.     Tenderness: obese abdomen and non tender   Musculoskeletal:        General: No swelling or tenderness.   Skin:    Comments: RT arm PICC OK with no concerns   Neurological:     General: Grossly non focal, awake, alert and oriented   Psychiatric:        Mood and Affect: Mood normal.    Pertinent Microbiology Results for orders placed or performed during the hospital encounter of 05/16/21  Blood culture (routine x 2)     Status: None   Collection Time: 05/16/21  3:21 PM   Specimen: BLOOD  Result Value Ref Range Status   Specimen Description BLOOD SITE NOT SPECIFIED  Final   Special Requests   Final    BOTTLES DRAWN AEROBIC AND ANAEROBIC Blood Culture adequate volume   Culture   Final    NO GROWTH 5 DAYS Performed at [facility], [place] [address] [place], [place] [address]    Report Status 05/21/2021 FINAL  Final  Blood culture (routine x 2)     Status: None   Collection Time: 05/16/21  8:09 PM   Specimen: BLOOD  Result Value Ref Range Status   Specimen Description BLOOD SITE NOT SPECIFIED  Final   Special Requests   Final    BOTTLES DRAWN AEROBIC AND ANAEROBIC Blood Culture adequate volume   Culture   Final    NO GROWTH 5 DAYS Performed at [facility], [place] [address] [place], [place] [address]    Report Status 05/21/2021 FINAL  Final  Resp Panel by RT-PCR (Flu A&B, Covid) Nasopharyngeal Swab     Status: None   Collection Time: 05/16/21 10:36 PM   Specimen: Nasopharyngeal Swab;  Nasopharyngeal(NP) swabs  in vial transport medium  Result Value Ref Range Status   SARS Coronavirus 2 by RT PCR NEGATIVE NEGATIVE Final    Comment: (NOTE) SARS-CoV-2 target nucleic acids are NOT DETECTED.  The SARS-CoV-2 RNA is generally detectable in upper respiratory specimens during the acute phase of infection. The lowest concentration of SARS-CoV-2 viral copies this assay can detect is 138 copies/mL. A negative result does not preclude SARS-Cov-2 infection and should not be used as the sole basis for treatment or other patient management decisions. A negative result may occur with  improper specimen collection/handling, submission of specimen other than nasopharyngeal swab, presence of viral mutation(s) within the areas targeted by this assay, and inadequate number of viral copies(<138 copies/mL). A negative result must be combined with clinical observations, patient history, and epidemiological information. The expected result is Negative.  Fact Sheet for Patients:  EntrepreneurPulse.com.au  Fact Sheet for Healthcare Providers:  IncredibleEmployment.be  This test is no t yet approved or cleared by the [place] FDA and  has been authorized for detection and/or diagnosis of SARS-CoV-2 by FDA under an Emergency Use Authorization (EUA). This EUA will remain  in effect (meaning this test can be used) for the duration of the COVID-19 declaration under Section 564(b)(1) of the Act, 21 U.S.C.section 360bbb-3(b)(1), unless the authorization is terminated  or revoked sooner.       Influenza A by PCR NEGATIVE NEGATIVE Final   Influenza B by PCR NEGATIVE NEGATIVE Final    Comment: (NOTE) The Xpert Xpress SARS-CoV-2/FLU/RSV plus assay is intended as an aid in the diagnosis of influenza from Nasopharyngeal swab specimens and should not be used as a sole basis for treatment. Nasal washings and aspirates are unacceptable for Xpert Xpress  SARS-CoV-2/FLU/RSV testing.  Fact Sheet for Patients: EntrepreneurPulse.com.au  Fact Sheet for Healthcare Providers: IncredibleEmployment.be  This test is not yet approved or cleared by the [place] FDA and has been authorized for detection and/or diagnosis of SARS-CoV-2 by FDA under an Emergency Use Authorization (EUA). This EUA will remain in effect (meaning this test can be used) for the duration of the COVID-19 declaration under Section 564(b)(1) of the Act, 21 U.S.C. section 360bbb-3(b)(1), unless the authorization is terminated or revoked.  Performed at [facility], [place] [address] [place], [place] [address]   Aerobic/Anaerobic Culture w Gram Stain (surgical/deep wound)     Status: None   Collection Time: 05/17/21  1:35 PM   Specimen: Abscess  Result Value Ref Range Status   Specimen Description ABSCESS  Final   Special Requests RIGHT LOWER CHEST  Final   Gram Stain   Final    FEW WBC PRESENT,BOTH PMN AND MONONUCLEAR ABUNDANT GRAM POSITIVE COCCI ABUNDANT GRAM POSITIVE RODS    Culture   Final    FEW STREPTOCOCCUS MITIS/ORALIS FEW ACTINOMYCES NEUII Standardized susceptibility testing for this organism is not available. NO ANAEROBES ISOLATED Performed at [facility], [place] [address], [place], [place] [address]    Report Status 05/22/2021 FINAL  Final   Organism ID, Bacteria STREPTOCOCCUS MITIS/ORALIS  Final      Susceptibility   Streptococcus mitis/oralis - MIC*    TETRACYCLINE >=16 RESISTANT Resistant     VANCOMYCIN 0.5 SENSITIVE Sensitive     CLINDAMYCIN <=0.25 SENSITIVE Sensitive     PENICILLIN Value in next row Sensitive      SENSITIVE0.06    CEFTRIAXONE Value in next row Sensitive      SENSITIVE0.25    * FEW  STREPTOCOCCUS MITIS/ORALIS  Surgical PCR Screen     Status: None   Collection Time: 05/22/21 11:54 AM   Specimen: Nasal Mucosa; Nasal Swab  Result Value Ref Range Status   MRSA, PCR NEGATIVE  NEGATIVE Final   Staphylococcus aureus NEGATIVE NEGATIVE Final    Comment: (NOTE) The Xpert SA Assay (FDA approved for NASAL specimens in patients 34 years of age and older), is one component of a comprehensive surveillance program. It is not intended to diagnose infection nor to guide or monitor treatment. Performed at [facility], [place] [address] [place], [place] [address]    Pertinent Lab seen by me: CBC Latest Ref Rng & Units 06/04/2021 06/02/2021 05/29/2021  WBC 4.0 - 10.5 K/uL 12.8(H) 9.2 8.3  Hemoglobin 12.0 - 15.0 g/dL 11.5(L) 12.6 10.1(L)  Hematocrit 36.0 - 46.0 % 36.2 39.3 31.5(L)  Platelets 150 - 400 K/uL 380 466(H) 400   CMP Latest Ref Rng & Units 06/04/2021 06/02/2021 05/29/2021  Glucose 70 - 99 mg/dL 106(H) 112(H) 107(H)  BUN 6 - 20 mg/dL 9 6 5(L)  Creatinine 0.44 - 1.00 mg/dL 0.71 0.69 0.68  Sodium 135 - 145 mmol/L 139 138 136  Potassium 3.5 - 5.1 mmol/L 3.9 3.9 4.1  Chloride 98 - 111 mmol/L 104 104 102  CO2 22 - 32 mmol/L 25 25 27   Calcium 8.9 - 10.3 mg/dL 8.9 9.1 8.8(L)  Total Protein 6.5 - 8.1 g/dL - - -  Total Bilirubin 0.3 - 1.2 mg/dL - - -  Alkaline Phos 38 - 126 U/L - - -  AST 15 - 41 U/L - - -  ALT 0 - 44 U/L - - -     Pertinent Imagings/Other Imagings Plain films and CT images have been personally visualized and interpreted; radiology reports have been reviewed. Decision making incorporated into the Impression / Recommendations.  DG Chest 2 View  Result Date: 05/29/2021 CLINICAL DATA:  Pneumonia EXAM: CHEST - 2 VIEW COMPARISON:  Chest x-ray dated May 26, 2021 FINDINGS: Unchanged position of right PICC line. Unchanged position right chest tube. No evidence pleural effusion or pneumothorax. Cardiac and mediastinal contours are unchanged. Rounded opacity of the anterior right lung base previously characterized on prior CT and compatible with pericardial fluid collection. IMPRESSION: Right-sided chest tube in place. No evidence of pleural effusion or  pneumothorax. Electronically Signed   By: Yetta Glassman M.D.   On: 05/29/2021 08:02   DG Chest 2 View  Addendum Date: 05/16/2021   ADDENDUM REPORT: 05/16/2021 14:09 ADDENDUM: Critical Value/emergent results were called by telephone at the time of interpretation on 05/16/2021 at 2:08 pm to provider [facility] , who verbally acknowledged these results. Electronically Signed   By: Kerby Moors M.D.   On: 05/16/2021 14:09   Result Date: 05/16/2021 CLINICAL DATA:  Evaluate for pneumonia. Cough for 2 weeks. Complications status post hernia repair EXAM: CHEST - 2 VIEW COMPARISON:  04/11/2021 FINDINGS: Stable heart size. No pleural effusion or interstitial edema. Large mass in the region of the right CP angle is identified with air-fluid level measuring 10.6 x 11.1 cm. The left lung appears clear. No acute airspace opacities identified. IMPRESSION: Interval reaccumulation of right lower chest fluid collection now with air-fluid level. Recommend further evaluation with repeat CT of the chest with contrast material and/or referral to thoracic surgery or interventional radiology. Electronically Signed: By: Kerby Moors M.D. On: 05/16/2021 13:35   CT Chest W Contrast  Result Date: 05/16/2021 CLINICAL DATA:  Lung/mediastinal abscess. Chest  hernia repair November 30th. Recent subdiaphragmatic drain placement. EXAM: CT CHEST WITH CONTRAST TECHNIQUE: Multidetector CT imaging of the chest was performed during intravenous contrast administration. RADIATION DOSE REDUCTION: This exam was performed according to the departmental dose-optimization program which includes automated exposure control, adjustment of the mA and/or kV according to patient size and/or use of iterative reconstruction technique. CONTRAST:  69m OMNIPAQUE IOHEXOL 350 MG/ML SOLN COMPARISON:  Radiograph earlier today. Lung bases from abdominal CT 05/05/2021, chest CT 04/11/2021 FINDINGS: Cardiovascular: The heart is normal in size. There are coronary  artery calcifications. Slight leftward cardiac deviation due to right mediastinal fluid collection. Thoracic aorta is normal in caliber. Mediastinum/Nodes: Right paramediastinal fluid collection, interval removal of drainage catheter from prior abdominal CT. Collection measures 10.1 x 10.4 x 8.2 cm (TR by AP by CC), measured on series 3 image 100 on series 5 image 87. Air-fluid level, approximately 60% fluid. There is surrounding fat stranding and inflammatory change. This appears to be supradiaphragmatic in location. Small amount of non organized fluid tracks anterior inferiorly. There are small adjacent epicardial lymph nodes. Small mediastinal lymph nodes are not enlarged by size criteria. No hilar adenopathy. No esophageal wall thickening. Lungs/Pleura: Compressive atelectasis in the anterior right lung related to anterior mediastinal collection. The lungs are otherwise clear. No pleural effusion or pneumothorax. No pulmonary nodule. Trachea and central bronchi are patent. Upper Abdomen: Stranding and small amount of ill-defined fluid in the anterior upper abdomen anterior to the liver. This appears similar to abdominal CT 11 days ago. Hepatic steatosis. Musculoskeletal: There are no acute or suspicious osseous abnormalities. No subcutaneous fluid collection at site of drain. IMPRESSION: 1. Reaccumulation of right paramediastinal air fluid collection after removal of drainage catheter. Collection measures 10.1 x 10.4 x 8.2 cm, with surrounding fat stranding and inflammatory change. This appears to be supradiaphragmatic in location. 2. Stranding and small amount of ill-defined fluid in the anterior upper abdomen anterior to the liver, similar to abdominal CT 11 days ago. 3. Aortic atherosclerosis and coronary artery calcifications Electronically Signed   By: MKeith RakeM.D.   On: 05/16/2021 21:20   DG CHEST PORT 1 VIEW  Result Date: 06/04/2021 CLINICAL DATA:  Pleural effusion.  Recent thoracic surgery.  EXAM: PORTABLE CHEST 1 VIEW COMPARISON:  05/29/2021 FINDINGS: Mildly degraded exam due to AP portable technique and patient body habitus. Midline trachea. Cardiomegaly accentuated by AP portable technique. Right-sided chest tube is similar in position. No pleural effusion or pneumothorax. No congestive failure. Low lung volumes with resultant pulmonary interstitial prominence. Similar appearance of increased density at the medial right lung base which represented a fluid collection as on 05/17/2021 CT. IMPRESSION: Right-sided chest tube remaining in place, without pneumothorax or definite pleural fluid. Mild limitations, as detailed above. Cardiomegaly without congestive failure. Electronically Signed   By: KAbigail MiyamotoM.D.   On: 06/04/2021 07:47   DG Chest Port 1 View  Result Date: 05/26/2021 CLINICAL DATA:  Right pleural effusion EXAM: PORTABLE CHEST 1 VIEW COMPARISON:  Previous studies including the examination of 05/25/2021 FINDINGS: Transverse diameter of heart is increased. Central pulmonary vessels are prominent. Increased density in the lower lung fields has not changed. Right chest tubes are seen. Right PICC line is noted. Tip of PICC line is not distinctly visualized. Left lateral CP angle is indistinct. There is no pneumothorax. IMPRESSION: Cardiomegaly. Increased markings in both lower lung fields may suggest atelectasis/pneumonia. There is no pneumothorax. Electronically Signed   By: PElmer PickerM.D.   On:  05/26/2021 09:06   DG Chest Port 1 View  Result Date: 05/25/2021 CLINICAL DATA:  Chest tube in place.  Follow-up exam. EXAM: PORTABLE CHEST 1 VIEW COMPARISON:  05/23/2021. FINDINGS: The 2 right-sided chest tubes are stable.  No pneumothorax. Bilateral lung base opacities obscure the hemidiaphragms, consistent with a combination of small effusions and atelectasis. Lung volumes remain low accentuating the bronchovascular markings. Cardiac silhouette normal in size. Right PICC is stable,  tip in the lower superior vena cava. IMPRESSION: 1. No significant change from the most recent prior study. 2. Stable dual right chest tubes.  No pneumothorax. 3. Persistent bilateral lung base opacities, accentuated by low lung volumes, consistent with atelectasis with small associated pleural effusion suspected. Electronically Signed   By: Lajean Manes M.D.   On: 05/25/2021 08:37   DG Chest Port 1 View  Result Date: 05/23/2021 CLINICAL DATA:  Chest tube present status post lung surgery. Sore chest today. EXAM: PORTABLE CHEST 1 VIEW COMPARISON:  05/22/2021 FINDINGS: Right hemithorax chest tube is vertically oriented with the tip overlying the superolateral right hemithorax. This appears mildly retracted more inferiorly compared to prior. Right upper extremity PICC is visualized central as the mid superior vena cava however the tip is not definitively identified. Moderately decreased lung volumes with bibasilar bronchovascular crowding. Likely small left left-greater-than-right pleural effusions and bibasilar heterogeneous airspace opacities. No definite pneumothorax.  No acute osseous abnormality. Cardiac silhouette is again moderately to markedly enlarged. Mediastinal contours are grossly within normal limits. IMPRESSION:: IMPRESSION: 1. Right hemithorax chest tube appears mildly retracted with the tip overlying the superolateral aspect of the right hemithorax. 2. No significant change in low lung volumes and small left-greater-than-right pleural effusions with bibasilar airspace opacities. Electronically Signed   By: Yvonne Kendall M.D.   On: 05/23/2021 08:07   DG Chest Port 1 View  Result Date: 05/22/2021 CLINICAL DATA:  Abdominal pain EXAM: PORTABLE CHEST 1 VIEW COMPARISON:  Radiograph 05/19/2021 FINDINGS: Unchanged enlarged cardiac silhouette. There is a new right apical chest tube in place. Persistent small right pleural effusion and right basilar opacities. Unchanged small left pleural effusion and  adjacent left basilar opacities. No visible pneumothorax. No acute osseous abnormality. IMPRESSION: New right apical chest tube. Persistent small right pleural effusion and adjacent basilar consolidation. No visible pneumothorax. Unchanged small left pleural effusion and adjacent left basilar opacities. Electronically Signed   By: Maurine Simmering M.D.   On: 05/22/2021 16:29   DG Chest Port 1 View  Result Date: 05/19/2021 CLINICAL DATA:  A 46 year old female presents with history of reported intrathoracic abscess. EXAM: PORTABLE CHEST 1 VIEW COMPARISON:  May 18, 2021 FINDINGS: Persistent added density in the cardiophrenic recess on the RIGHT and along the RIGHT hemidiaphragm and RIGHT heart border. A pigtail drain projects over the RIGHT lower chest in this area medially. EKG leads project over the chest. Cardiomediastinal contours are stable. The LEFT basilar consolidation with blunting of LEFT costodiaphragmatic sulcus is similar to the prior study. No visible pneumothorax. On limited assessment there is no acute skeletal process. IMPRESSION: 1. Persistent added density in the cardiophrenic recess on the RIGHT and along the RIGHT hemidiaphragm and RIGHT heart border at the site of abscess with pigtail drain in place. 2. Bibasilar airspace disease and likely small bilateral effusions with similar appearance. 3. No visible pneumothorax. Electronically Signed   By: [name] Bills M.D.   On: 05/19/2021 08:06   DG Chest Port 1 View  Result Date: 05/18/2021 CLINICAL DATA:  Intrathoracic abscess. EXAM:  PORTABLE CHEST 1 VIEW COMPARISON:  05/17/2021 FINDINGS: 0816 hours. Low lung volumes. Cavitary lesion seen medial right hemithorax on the previous study has decreased substantially in the interval. Cardiopericardial silhouette is at upper limits of normal for size. Bibasilar atelectasis/infiltrate with small bilateral pleural effusions again noted. Although a discrete pleural line is not visible, lung markings at  the periphery of the right hemithorax are asymmetrically decreased. IMPRESSION: 1. Interval decrease in size of cavitary lesion medial right hemithorax. 2. Persistent bibasilar atelectasis/infiltrate with small bilateral pleural effusions. 3. Decreased lung markings in the periphery of the right hemithorax. Although a discrete pleural line is not visible, this does raise the question of pleural gas/pneumothorax. Electronically Signed   By: Misty Stanley M.D.   On: 05/18/2021 10:08   DG CHEST PORT 1 VIEW  Result Date: 05/17/2021 CLINICAL DATA:  46 year old female with shortness of breath. EXAM: PORTABLE CHEST 1 VIEW COMPARISON:  Chest CT 05/16/2021 and earlier. FINDINGS: Portable AP semi upright view at 0605 hours. Air in fluid containing mass along the right cardiophrenic angle as seen by CT. Stable lung volumes and ventilation from yesterday. Visualized tracheal air column is within normal limits. No pneumothorax. No pleural effusion is evident. Paucity of bowel gas. No acute osseous abnormality identified. IMPRESSION: 1. Recurrent right cardiophrenic air-fluid collection stable from CT yesterday. 2. No new cardiopulmonary abnormality. Electronically Signed   By: Genevie Ann M.D.   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 Percutaneous drain was recently removed from this area. Patient presents for new drain placement. EXAM: CT-GUIDED PLACEMENT OF A RIGHT CHEST DRAIN MEDICATIONS: Moderate sedation ANESTHESIA/SEDATION: Moderate (conscious) sedation was employed during this procedure. A total of Versed 4.75m and fentanyl 100 mcg was administered intravenously at the order of the provider performing the procedure. Total intra-service moderate sedation time: 56 minutes. Patient's level of consciousness and vital signs were  monitored continuously by radiology nurse throughout the procedure under the supervision of the provider performing the procedure. COMPLICATIONS: None immediate. PROCEDURE: Informed written consent was obtained from the patient after a thorough discussion of the procedural risks, benefits and alternatives. All questions were addressed. A timeout was performed prior to the initiation of the procedure. Patient was placed supine on the CT scanner. CT images through the lower chest were obtained. The large air-fluid collection in the right lower chest and old hernia space was identified. The right anterior chest was prepped and draped in sterile fashion. Maximal barrier sterile technique was utilized including caps, mask, sterile gowns, sterile gloves, sterile drape, hand hygiene and skin antiseptic. Skin was anesthetized with 1% lidocaine. A small incision was made. Using CT guidance, an 18 gauge trocar needle was directed into the air-fluid collection and foul-smelling brown fluid was aspirated. Superstiff Amplatz wire was advanced into the collection and the tract was dilated to accommodate a 12 [place] drain. Approximally 300 mL of brown fluid was removed. Follow up CT images were obtained. The drain was slightly pulled back in order to aspirate more fluid and air. Drain was initially connected to a PleurEvac device but there was minimal output with suction. Drain was flushed with saline and attached to a suction bulb. Catheter was sutured to skin. Dressing was placed. FINDINGS: Large air-fluid collection in the medial right lower chest at the old Morgagni hernia site. Fluid collection does not  appear to be communicating with the right pleural space. 300 mL of foul-smelling brown fluid was removed. At the end of the procedure, the air-fluid collection was decompressed but there was a small amount of residual gas within this collection. IMPRESSION: CT-guided placement of a drainage catheter within the  right lower chest fluid collection located in the old hernia space. 300 mL of foul-smelling fluid was removed. Collection was decompressed at the end of the procedure. Fluid was sent for culture. Electronically Signed   By: Markus Daft M.D.   On: 05/17/2021 18:10   [place] EKG SITE RITE  Result Date: 05/19/2021 If [facility] image not attached, placement could not be confirmed due to current cardiac rhythm.    I spent more than 80 minutes for this patient encounter including review of prior medical records/discussing diagnostics and treatment plan with the patient/family/coordinate care with primary/other specialits with greater than 50% of time in face to face encounter.   Electronically signed by:   Rosiland Oz, MD Infectious Disease Physician [facility] Pager: [phone]

## 2021-06-06 ENCOUNTER — Ambulatory Visit: Payer: 59 | Admitting: Cardiology

## 2021-06-06 ENCOUNTER — Telehealth: Payer: Self-pay

## 2021-06-06 NOTE — Telephone Encounter (Signed)
Transition Care Management Follow-up Telephone Call ?Date of discharge and from where: [place] 3/9   ?How have you been since you were released from the hospital? Slow coming but doing ok ?Any questions or concerns? No ? ?Items Reviewed: ?Did the pt receive and understand the discharge instructions provided? Yes  ?Medications obtained and verified? Yes  ?Other?  N/a ?Any new allergies since your discharge? No  ?Dietary orders reviewed? Yes ?Do you have support at home? Yes  ? ?Home Care and Equipment/Supplies: ?Were home health services ordered? no ?If so, what is the name of the agency? N/a  ?Has the agency set up a time to come to the patient's home? not applicable ?Were any new equipment or medical supplies ordered?  No ?What is the name of the medical supply agency? N/a ?Were you able to get the supplies/equipment? not applicable ?Do you have any questions related to the use of the equipment or supplies? No ? ?Functional Questionnaire: (I = Independent and D = Dependent) ?ADLs: d  ? ?Bathing/Dressing- d ? ?Meal Prep- d ? ?Eating- i ? ?Maintaining continence- d ? ?Transferring/Ambulation- d ? ?Managing Meds- i ? ?Follow up appointments reviewed: ? ?PCP Hospital f/u appt confirmed? Yes  Scheduled to see Dr Renold Genta on 06/10/21 @ 12. ?[facility] f/u appt confirmed? Yes  Scheduled to see Cardiology on 4/25 @ 3 and Dr Kipp Brood 3/17 @ 1010.[name] ?Are transportation arrangements needed? No  ?If their condition worsens, is the pt aware to call PCP or go to the Emergency Dept.? Yes ?Was the patient provided with contact information for the PCP's office or ED? Yes ?Was to pt encouraged to call back with questions or concerns? Yes  ?

## 2021-06-06 NOTE — Telephone Encounter (Signed)
Transition Care Management Unsuccessful Follow-up Telephone Call ? ?Date of discharge and from where:  [facility]3 ? ?Attempts:  1st Attempt ? ?Reason for unsuccessful TCM follow-up call:  Left voice message ? ?  ?

## 2021-06-10 ENCOUNTER — Encounter: Payer: Self-pay | Admitting: Internal Medicine

## 2021-06-10 ENCOUNTER — Other Ambulatory Visit (HOSPITAL_COMMUNITY): Payer: Self-pay

## 2021-06-10 ENCOUNTER — Other Ambulatory Visit: Payer: Self-pay

## 2021-06-10 ENCOUNTER — Ambulatory Visit (INDEPENDENT_AMBULATORY_CARE_PROVIDER_SITE_OTHER): Payer: 59 | Admitting: Internal Medicine

## 2021-06-10 VITALS — BP 102/70 | HR 92 | Temp 98.0°F | Ht 64.0 in | Wt 298.5 lb

## 2021-06-10 DIAGNOSIS — R519 Headache, unspecified: Secondary | ICD-10-CM

## 2021-06-10 DIAGNOSIS — Z9889 Other specified postprocedural states: Secondary | ICD-10-CM

## 2021-06-10 DIAGNOSIS — I10 Essential (primary) hypertension: Secondary | ICD-10-CM | POA: Diagnosis not present

## 2021-06-10 DIAGNOSIS — F419 Anxiety disorder, unspecified: Secondary | ICD-10-CM

## 2021-06-10 DIAGNOSIS — F32A Depression, unspecified: Secondary | ICD-10-CM

## 2021-06-10 DIAGNOSIS — Q790 Congenital diaphragmatic hernia: Secondary | ICD-10-CM | POA: Diagnosis not present

## 2021-06-10 DIAGNOSIS — Z0189 Encounter for other specified special examinations: Secondary | ICD-10-CM | POA: Diagnosis not present

## 2021-06-10 DIAGNOSIS — I252 Old myocardial infarction: Secondary | ICD-10-CM

## 2021-06-10 DIAGNOSIS — R5383 Other fatigue: Secondary | ICD-10-CM

## 2021-06-10 DIAGNOSIS — R69 Illness, unspecified: Secondary | ICD-10-CM | POA: Diagnosis not present

## 2021-06-10 MED ORDER — CLOTRIMAZOLE-BETAMETHASONE 1-0.05 % EX CREA: 1.0000 "application " | TOPICAL_CREAM | Freq: Every day | CUTANEOUS | 0 refills | Status: DC

## 2021-06-10 MED ORDER — CLOTRIMAZOLE-BETAMETHASONE 1-0.05 % EX CREA
1.0000 "application " | TOPICAL_CREAM | Freq: Two times a day (BID) | CUTANEOUS | 2 refills | Status: DC
  Filled 2021-06-10: qty 15, 8d supply, fill #0

## 2021-06-10 NOTE — Progress Notes (Signed)
? ?Subjective:  ? ? Patient ID: Rhonda Colon, female    DOB: Jun 17, 1975, 46 y.o.   MRN: [id number] ? ?HPI 46 year old Female recently hospitalized February 17 through June 05, 2021.  Here for hospital follow-up (Primary care). ? ?Patient had Morgagni hernia surgical repair by Dr. Kipp Brood via robotic assisted approach February 24, 2021.  She was seen for follow-up April 04, 2021 at which time she had some increased pain in the epigastrium which had developed 2 weeks prior to visit.  Chest x-ray obtained at the time showed recurrence of previously repaired hernia.  CT scan obtained and showed no recurrence of the hernia.  There was a large fluid collection felt to be a seroma in her hernia space.  It was recommended she undergo a CT-guided drain placement to treat the collection.  This was done on April 30, 2021 with initial removal of 1 L of fluid which improved her symptoms.  She followed up with interventional radiology on February 6 at which time her drain output was minimal.  Fluoroscopy of the drain showed minimal residual activity and her drain was removed. ? ?Patient presented here on February 17 after developed fever, chills, headache myalgias onset several days previously.  Rapid flu and rapid COVID test were negative here in the office.  Patient had pulse of 105, blood pressure 100/70 and pulse ox of 97% with temp of 99.3 degrees.  She was sent for chest x-ray.  Dr. Clovis Riley from radiology called me saying that fluid in chest had reaccumulated.  She was given 1 g IM Rocephin here in the office.  I contacted Dr. Kipp Brood and she was sent to the emergency department.  She was subsequently admitted and had CT image guided drainage on February 18.  300 cc of foul-smelling fluid was removed.  Culture grew few Streptococcus mitis/oralis and few actinomyces neuii.  Subsequently underwent robotic assisted surgery on February 23 by Dr. Kipp Brood with mesh removal and abscess drained.  A chest tube was placed.   Then on March 6 she had video thoracoscopy to unroofed the abscess cavity.  She was discharged home on March 9.  She has been staying with her mother.  She was prescribed Augmentin 875/125 at discharge to take twice daily for 28 days and was given tramadol for pain. ? ? ? ?Review of Systems  She been trying to walk a little bit.  She is having irritation which looks like a contact type dermatitis where some of the EKG leads were placed on her chest.  Have prescribed Lotrisone cream to use once or twice daily to help with that.  I think she should be walking more.  I had detailed discussion with her about the importance of walking and getting her strength back.  We talked about staying with her mother versus returning to her own home.  Currently she and her boyfriend are not together so she does not have help at home.  Her mother has health problems. ? ?   ?Objective:  ? Physical Exam ?Blood pressure 102/70 pulse 92 temperature 98 degrees orally pulse oximetry 97% weight 298 pounds 8 ounces BMI 51.24 ? ?On her anterior chest there is an apparent contact dermatitis from EKG leads leaving circular areas of erythema.  These do not appear to be infected. ? ?Her chest is clear.  Cardiac exam regular rate and rhythm.  She has trace lower extremity edema.  ? ?   ?Assessment & Plan:  ?History of repair of Morgagni  hernia in November with development of seroma in hernia space  January 2023.  She underwent CT-guided drain placement to treat fluid collection on April 30, 2021.  1 L of fluid was removed with improvement in symptoms.  Subsequently, developed febrile illness with development of abscess in chest (Streptococcus mitis/oralis and actinomyces  neuii)requiring surgical intervention February 17.  Discharged home June 05, 2021.  Overall seems to be doing much better.  She needs to walk more to regain her strength and to help with atelectasis.  She has contact type dermatitis on anterior chest which will be treated with  Lotrisone cream twice daily.  She has follow-up appointment with Dr. Kipp Brood March 17. ? ?  We did draw prealbumin, CBC and c-Met today.  I will follow-up with these results.  She is on Prozac 40 mg daily for depression.  She has Xanax 1 mg tablets to take one half 1 mg up to twice daily as needed for anxiety.  I will plan to follow-up in 2 to 3 weeks. ? ?

## 2021-06-10 NOTE — Patient Instructions (Addendum)
It will take time to regain your strength. Walk some everyday with someone watching you. We talked about PT. Take as few pain meds as you can. Use spirometer as you were directed. Labs drawn and pending with further instructions to follow. Use Lotrisone cream sparingly on irritated skin once or twice daily as needed. Stay well hydrated and try to eat protein.Keep follow up with Dr. Cliffton Asters. ?

## 2021-06-11 ENCOUNTER — Telehealth: Payer: Self-pay | Admitting: Internal Medicine

## 2021-06-11 LAB — CBC WITH DIFFERENTIAL/PLATELET
Absolute Monocytes: 891 cells/uL (ref 200–950)
Basophils Absolute: 55 cells/uL (ref 0–200)
Basophils Relative: 0.5 %
Eosinophils Absolute: 407 cells/uL (ref 15–500)
Eosinophils Relative: 3.7 %
HCT: 42.6 % (ref 35.0–45.0)
Hemoglobin: 14 g/dL (ref 11.7–15.5)
Lymphs Abs: 4081 cells/uL — ABNORMAL HIGH (ref 850–3900)
MCH: 28.7 pg (ref 27.0–33.0)
MCHC: 32.9 g/dL (ref 32.0–36.0)
MCV: 87.3 fL (ref 80.0–100.0)
MPV: 11.4 fL (ref 7.5–12.5)
Monocytes Relative: 8.1 %
Neutro Abs: 5566 cells/uL (ref 1500–7800)
Neutrophils Relative %: 50.6 %
Platelets: 447 10*3/uL — ABNORMAL HIGH (ref 140–400)
RBC: 4.88 10*6/uL (ref 3.80–5.10)
RDW: 12.6 % (ref 11.0–15.0)
Total Lymphocyte: 37.1 %
WBC: 11 10*3/uL — ABNORMAL HIGH (ref 3.8–10.8)

## 2021-06-11 LAB — COMPLETE METABOLIC PANEL WITH GFR
AG Ratio: 1.2 (calc) (ref 1.0–2.5)
ALT: 14 U/L (ref 6–29)
AST: 21 U/L (ref 10–35)
Albumin: 4 g/dL (ref 3.6–5.1)
Alkaline phosphatase (APISO): 85 U/L (ref 31–125)
BUN: 8 mg/dL (ref 7–25)
CO2: 23 mmol/L (ref 20–32)
Calcium: 9.5 mg/dL (ref 8.6–10.2)
Chloride: 101 mmol/L (ref 98–110)
Creat: 0.72 mg/dL (ref 0.50–0.99)
Globulin: 3.3 g/dL (calc) (ref 1.9–3.7)
Glucose, Bld: 85 mg/dL (ref 65–99)
Potassium: 5.1 mmol/L (ref 3.5–5.3)
Sodium: 140 mmol/L (ref 135–146)
Total Bilirubin: 0.4 mg/dL (ref 0.2–1.2)
Total Protein: 7.3 g/dL (ref 6.1–8.1)
eGFR: 104 mL/min/{1.73_m2} (ref >=60)

## 2021-06-11 LAB — PREALBUMIN: Prealbumin: 22 mg/dL (ref 17–34)

## 2021-06-11 NOTE — Telephone Encounter (Signed)
[facility] Colon ?[phone] ? ?[name] called with concerns about her labs, she had seen results in [place]. She wondered if you had any. I let her know when you looked at them if you had any concerns you would have [facility] call. ?

## 2021-06-13 ENCOUNTER — Other Ambulatory Visit (HOSPITAL_COMMUNITY): Payer: Self-pay

## 2021-06-13 ENCOUNTER — Ambulatory Visit (INDEPENDENT_AMBULATORY_CARE_PROVIDER_SITE_OTHER): Payer: Self-pay | Admitting: Thoracic Surgery (Cardiothoracic Vascular Surgery)

## 2021-06-13 ENCOUNTER — Other Ambulatory Visit: Payer: Self-pay

## 2021-06-13 ENCOUNTER — Other Ambulatory Visit: Payer: Self-pay | Admitting: Thoracic Surgery (Cardiothoracic Vascular Surgery)

## 2021-06-13 VITALS — BP 129/84 | HR 100 | Resp 20 | Ht 64.0 in | Wt 238.0 lb

## 2021-06-13 DIAGNOSIS — Z09 Encounter for follow-up examination after completed treatment for conditions other than malignant neoplasm: Secondary | ICD-10-CM

## 2021-06-13 DIAGNOSIS — Q790 Congenital diaphragmatic hernia: Secondary | ICD-10-CM

## 2021-06-13 MED ORDER — TRAMADOL HCL 50 MG PO TABS
50.0000 mg | ORAL_TABLET | Freq: Four times a day (QID) | ORAL | 0 refills | Status: DC | PRN
  Filled 2021-06-13: qty 50, 13d supply, fill #0

## 2021-06-13 MED ORDER — TRAMADOL HCL 50 MG PO TABS: 50.0000 mg | ORAL_TABLET | Freq: Four times a day (QID) | ORAL | 0 refills | Status: DC | PRN

## 2021-06-13 NOTE — Progress Notes (Signed)
? ?   ?  [address] ?      [place] [address] ?            [phone]       ? ?Adelayde A Brocato ?[facility] Medical Record #[id number] ?Date of Birth: 09/05/1975 ? ?Referring: Rollene Rotunda, MD ?Primary Care: Margaree Mackintosh, MD ?Primary Cardiologist:James Hochrein, MD ? ?Reason for visit:   follow-up ? ?History of Present Illness:     ?Ms. Tebbetts comes in for 1 week follow-up appointment.  Overall she is doing well.  She is having typical postoperative pain.  Her headache persists. ? ?Physical Exam: ?BP 129/84   Pulse 100   Resp 20   Ht 5\' 4"  (1.626 m)   Wt 238 lb (108 kg) Comment: 239lbs  LMP  (LMP Unknown) Comment: No cycle for 8 months  SpO2 94%   BMI 40.85 kg/m?  ? ?Alert NAD ?Incision clean.   ?Abdomen, ND ?No peripheral edema ? ? ? ?  ? ?Assessment / Plan:   ?46 year old female with history of Morgagni's hernia repair with mesh.  The mesh subsequently became infected and she recently underwent mesh resection along with primary repair of the defect with a biologic mesh.  Given her instructions to stagger her Tylenol and ibuprofen, and then to use the tramadol for pain as needed.  She will follow-up with 57 in 1 month with a chest x-ray. ? ? ?[place] Rance Smithson ?06/13/2021 11:42 AM ? ? ? ? ? ? ?

## 2021-06-26 ENCOUNTER — Encounter: Payer: Self-pay | Admitting: Cardiology

## 2021-06-26 DIAGNOSIS — E785 Hyperlipidemia, unspecified: Secondary | ICD-10-CM

## 2021-06-26 DIAGNOSIS — Z5181 Encounter for therapeutic drug level monitoring: Secondary | ICD-10-CM

## 2021-06-26 DIAGNOSIS — I2510 Atherosclerotic heart disease of native coronary artery without angina pectoris: Secondary | ICD-10-CM

## 2021-06-26 DIAGNOSIS — Z79899 Other long term (current) drug therapy: Secondary | ICD-10-CM

## 2021-07-01 ENCOUNTER — Other Ambulatory Visit: Payer: Self-pay

## 2021-07-01 ENCOUNTER — Ambulatory Visit: Payer: 59 | Admitting: Infectious Diseases

## 2021-07-01 ENCOUNTER — Encounter: Payer: Self-pay | Admitting: Infectious Diseases

## 2021-07-01 VITALS — BP 139/90 | HR 95 | Temp 98.0°F | Ht 64.0 in | Wt 302.0 lb

## 2021-07-01 DIAGNOSIS — Z1331 Encounter for screening for depression: Secondary | ICD-10-CM | POA: Insufficient documentation

## 2021-07-01 DIAGNOSIS — J869 Pyothorax without fistula: Secondary | ICD-10-CM | POA: Insufficient documentation

## 2021-07-01 DIAGNOSIS — K521 Toxic gastroenteritis and colitis: Secondary | ICD-10-CM

## 2021-07-01 DIAGNOSIS — T3695XA Adverse effect of unspecified systemic antibiotic, initial encounter: Secondary | ICD-10-CM | POA: Diagnosis not present

## 2021-07-01 DIAGNOSIS — T8579XA Infection and inflammatory reaction due to other internal prosthetic devices, implants and grafts, initial encounter: Secondary | ICD-10-CM | POA: Insufficient documentation

## 2021-07-01 DIAGNOSIS — T8579XD Infection and inflammatory reaction due to other internal prosthetic devices, implants and grafts, subsequent encounter: Secondary | ICD-10-CM | POA: Diagnosis not present

## 2021-07-01 DIAGNOSIS — Z5181 Encounter for therapeutic drug level monitoring: Secondary | ICD-10-CM | POA: Diagnosis not present

## 2021-07-01 MED ORDER — AMOXICILLIN 500 MG PO CAPS: 500.0000 mg | ORAL_CAPSULE | Freq: Three times a day (TID) | ORAL | 0 refills | Status: DC

## 2021-07-01 NOTE — Progress Notes (Addendum)
? ?  ? ? ?Patient Active Problem List  ? Diagnosis Date Noted  ? Pleural effusion 05/17/2021  ? Sepsis due to intrathoracic abscess (HCC) 05/16/2021  ? Diaphragmatic hernia 02/24/2021  ? Vulvovaginitis 01/31/2021  ? Dyslipidemia 06/16/2020  ? Other fatigue 08/31/2017  ? Shortness of breath on exertion 08/31/2017  ? Essential hypertension 08/31/2017  ? Depression with anxiety 12/10/2015  ? Urticaria, solar 08/08/2013  ? CAD (coronary artery disease) 08/28/2011  ? Chronic low back pain 07/27/2011  ? ? ?Patient's Medications  ?New Prescriptions  ? No medications on file  ?Previous Medications  ? ACETAMINOPHEN (TYLENOL) 500 MG TABLET    Take 500 mg by mouth every 6 (six) hours as needed for moderate pain or fever.  ? ALPRAZOLAM (XANAX) 1 MG TABLET    Take 0.5-1 tablets (0.5-1 mg total) by mouth 2 (two) times daily as needed for anxiety.  ? AMOXICILLIN-CLAVULANATE (AUGMENTIN) 875-125 MG TABLET    Take 1 tablet by mouth 2 (two) times daily for 28 days.  ? ASPIRIN 81 MG TABLET    Take 81 mg by mouth daily.  ? CETIRIZINE (ZYRTEC) 10 MG TABLET    Take 10 mg by mouth daily as needed for allergies.  ? CLOTRIMAZOLE-BETAMETHASONE (LOTRISONE) CREAM    Apply 1 application. topically daily.  ? FLUCONAZOLE (DIFLUCAN) 100 MG TABLET    Take 1 tablet (100 mg total) by mouth daily as needed (patient will take if she develops vaginal yeast infection).  ? FLUOXETINE (PROZAC) 40 MG CAPSULE    Take 1 capsule (40 mg total) by mouth daily.  ? HYDROCORTISONE CREAM 1 %    Apply topically 2 (two) times daily as needed for itching.  ? LISINOPRIL (ZESTRIL) 10 MG TABLET    Take 1 tablet (10 mg total) by mouth daily.  ? MULTIPLE VITAMIN (MULTIVITAMIN) CAPSULE    Take 1 capsule by mouth daily.  ? NITROGLYCERIN (NITROSTAT) 0.4 MG SL TABLET    Place 1 tablet (0.4 mg total) under the tongue every 5 (five) minutes as needed for chest pain.  ? OMEPRAZOLE (PRILOSEC) 20 MG CAPSULE    Take 1 capsule by mouth once daily  ? ROSUVASTATIN (CRESTOR) 40 MG TABLET     Take 1 tablet by mouth once daily  ? TRAMADOL (ULTRAM) 50 MG TABLET    Take 1 tablet (50 mg total) by mouth every 6 (six) hours as needed for moderate pain.  ?Modified Medications  ? No medications on file  ?Discontinued Medications  ? No medications on file  ? ? ?Subjective: ?Here for HFU for Left lung empyema and infected mesh in the setting of Morgagni hernia mesh repair  in Nov 2022. She has been taking augmentin twice a day after discharge from the hospital without missing doses. She has not taken augmentin for last 3 days due to diarrhea however. Denies fevers, chills and sweats. Has occasional headache. She can possibly walk a mile in her own speed. Lives alone and able to do household chores without hurry and taking time. She also  started going back to work last Friday and had to climb stairs and was very tired Saturday and needed to take analgesics but was back to normal in Sunday. She is able to walk up stairs but needs time. Denies cough. Appetite is good. Recently saw DR lightfoot, plan to fu in a month with chest xray. Discussed to switch antibiotics to amoxicillin three times a day until next fu.  ? ?Review of Systems: ?ROS all  systems reviewed and negative except as stated above ? ?Past Medical History:  ?Diagnosis Date  ? CAD (coronary artery disease)   ? a. 07/2011 NSTEMI/Cath: RCA 99%m, otw nl cors, EF 50%, inf hk -> RCA stented with 3.0x7616mm Promus DES  ? Depression   ? Hyperlipidemia   ? Hypertension   ? Morgagni hernia   ? a. Discovered incidentally on CT 07/2011 ->f/u with Dr. Lindie SpruceWyatt  ? Myocardial infarction [facility](HCC)   ? Obesity   ? Sinus bradycardia   ? a. Nocturnal, asymptomatic  ? Sun allergy   ? Tobacco abuse   ? a. quit 07/2011  ? ?Past Surgical History:  ?Procedure Laterality Date  ? CARDIAC CATHETERIZATION  08/10/2011  ? left heart, with angiogram; DES mid RCA  ? INTERCOSTAL NERVE BLOCK Right 05/22/2021  ? Procedure: INTERCOSTAL NERVE BLOCK;  Surgeon: Corliss SkainsLightfoot, Harrell O, MD;  Location: [facility]  OR;  Service: Thoracic;  Laterality: Right;  ? IR RADIOLOGIST EVAL & MGMT  05/05/2021  ? LEFT HEART CATHETERIZATION WITH CORONARY ANGIOGRAM N/A 08/10/2011  ? Procedure: LEFT HEART CATHETERIZATION WITH CORONARY ANGIOGRAM;  Surgeon: Kathleene Hazelhristopher D McAlhany, MD;  [facility];  Service: Cardiovascular;  Laterality: N/A;  ? TONSILLECTOMY    ? ? ?Social History  ? ?Tobacco Use  ? Smoking status: Former  ?  Packs/day: 1.00  ?  Years: 18.00  ?  Pack years: 18.00  ?  Types: Cigarettes  ?  Quit date: 08/09/2011  ?  Years since quitting: 9.9  ? Smokeless tobacco: Never  ? Tobacco comments:  ?  2013  ?Vaping Use  ? Vaping Use: Never used  ?Substance Use Topics  ? Alcohol use: Yes  ?  Alcohol/week: 4.0 standard drinks  ?  Types: 4 Glasses of wine per week  ?  Comment: occasionally  ? Drug use: No  ? ? ?Family History  ?Problem Relation Age of Onset  ? Coronary artery disease Mother [id number]  ?      stent  ? Heart attack Mother   ? Heart disease Mother   ? Hyperlipidemia Mother   ? Obesity Mother   ? Colon cancer Maternal Uncle   ? Arrhythmia Father   ? Depression Father   ? Coronary artery disease Maternal Aunt [id number]  ?     (Mother's twin sister)  ? Lung cancer Maternal Aunt   ? Coronary artery disease Maternal Uncle 46  ? Heart attack Maternal Aunt   ?     mother's twin  ? Heart attack Maternal Uncle   ? Breast cancer Maternal Grandmother   ? Cancer Neg Hx   ? Early death Neg Hx   ? Hypertension Neg Hx   ? Kidney disease Neg Hx   ? Stroke Neg Hx   ? Alcohol abuse Neg Hx   ? Diabetes Neg Hx   ? Drug abuse Neg Hx   ? ? ?Allergies  ?Allergen Reactions  ? Wound Dressing Adhesive Dermatitis and Rash  ? ? ?Health Maintenance  ?Topic Date Due  ? Hepatitis C Screening  Never done  ? COLONOSCOPY (Pts [phone] Insurance coverage will need to be confirmed)  Never done  ? COVID-19 Vaccine (3 - Moderna risk series) 08/11/2019  ? INFLUENZA VACCINE  10/28/2021  ? MAMMOGRAM  01/21/2022  ? TETANUS/TDAP  08/09/2023  ? PAP SMEAR-Modifier  08/10/2023   ? HIV Screening  Completed  ? HPV VACCINES  Aged Out  ? ? ?Objective: ? ?Vitals:  ? 07/01/21 0929  ?BP:  139/90  ?Pulse: 95  ?Temp: 98 ?F (36.7 ?C)  ?TempSrc: Oral  ?SpO2: 95%  ?Weight: (!) 302 lb (137 kg)  ?Height: 5\' 4"  (1.626 m)  ? ?Body mass index is 51.84 kg/m?. ? ?Physical Exam ?Constitutional:   ?   Appearance: Normal appearance. Morbidly obese  ?HENT:  ?   Head: Normocephalic and atraumatic.   ?   Mouth: Mucous membranes are moist.  ?Eyes: ?   Conjunctiva/sclera: Conjunctivae normal.  ?   Pupils:  ? ?Cardiovascular:  ?   Rate and Rhythm: Normal rate and regular rhythm.  ?   Heart sounds:  ? ?Pulmonary:  ?   Effort: Pulmonary effort is normal.  ?   Breath sounds: Normal breath sounds.  ? ?Abdominal:  ?   General: Non distended  ?   Palpations: soft.  ? ?Musculoskeletal:     ?   General: Normal range of motion.  ? ?Skin: ?   General: Skin is warm and dry.  ?   Comments: prior laparoscopic sites in her abdomen and drainage catheter site looks OK ? ?Neurological:  ?   General: grossly non focal  ?   Mental Status: awake, alert and oriented to person, place, and time.  ? ?Psychiatric:     ?   Mood and Affect: Mood normal.  ? ?Lab Results ?Lab Results  ?Component Value Date  ? WBC 11.0 (H) 06/10/2021  ? HGB 14.0 06/10/2021  ? HCT 42.6 06/10/2021  ? MCV 87.3 06/10/2021  ? PLT 447 (H) 06/10/2021  ?  ?Lab Results  ?Component Value Date  ? CREATININE 0.72 06/10/2021  ? BUN 8 06/10/2021  ? NA 140 06/10/2021  ? K 5.1 06/10/2021  ? CL 101 06/10/2021  ? CO2 23 06/10/2021  ?  ?Lab Results  ?Component Value Date  ? ALT 14 06/10/2021  ? AST 21 06/10/2021  ? ALKPHOS 73 05/24/2021  ? BILITOT 0.4 06/10/2021  ?  ?Lab Results  ?Component Value Date  ? CHOL 186 11/04/2020  ? HDL 55 11/04/2020  ? LDLCALC 117 (H) 11/04/2020  ? TRIG 73 11/04/2020  ? CHOLHDL 3.4 11/04/2020  ? ?Lab Results  ?Component Value Date  ? LABRPR NON-REACTIVE 08/09/2020  ? ?No results found for: HIV1RNAQUANT, HIV1RNAVL, CD4TABS ? ?Problem List Items Addressed  This Visit   ? ?  ? Respiratory  ? Empyema lung (HCC) - Primary  ?  ? Digestive  ? Antibiotic-associated diarrhea  ?  ? Other  ? Medication monitoring encounter  ? Relevant Orders  ? CBC (Completed)

## 2021-07-02 ENCOUNTER — Encounter: Payer: Self-pay | Admitting: Infectious Diseases

## 2021-07-02 DIAGNOSIS — K521 Toxic gastroenteritis and colitis: Secondary | ICD-10-CM | POA: Insufficient documentation

## 2021-07-02 LAB — BASIC METABOLIC PANEL
BUN: 10 mg/dL (ref 7–25)
CO2: 26 mmol/L (ref 20–32)
Calcium: 9.6 mg/dL (ref 8.6–10.2)
Chloride: 101 mmol/L (ref 98–110)
Creat: 0.65 mg/dL (ref 0.50–0.99)
Glucose, Bld: 100 mg/dL — ABNORMAL HIGH (ref 65–99)
Potassium: 4.7 mmol/L (ref 3.5–5.3)
Sodium: 137 mmol/L (ref 135–146)

## 2021-07-02 LAB — CBC
HCT: 43.6 % (ref 35.0–45.0)
Hemoglobin: 14.3 g/dL (ref 11.7–15.5)
MCH: 28.5 pg (ref 27.0–33.0)
MCHC: 32.8 g/dL (ref 32.0–36.0)
MCV: 86.9 fL (ref 80.0–100.0)
MPV: 11 fL (ref 7.5–12.5)
Platelets: 394 10*3/uL (ref 140–400)
RBC: 5.02 10*6/uL (ref 3.80–5.10)
RDW: 13.8 % (ref 11.0–15.0)
WBC: 9.9 10*3/uL (ref 3.8–10.8)

## 2021-07-02 LAB — SEDIMENTATION RATE: Sed Rate: 38 mm/h — ABNORMAL HIGH (ref 0–20)

## 2021-07-02 LAB — C-REACTIVE PROTEIN: CRP: 28.5 mg/L — ABNORMAL HIGH (ref <8.0)

## 2021-07-04 ENCOUNTER — Encounter: Payer: Self-pay | Admitting: Infectious Diseases

## 2021-07-05 ENCOUNTER — Encounter: Payer: Self-pay | Admitting: Internal Medicine

## 2021-07-08 ENCOUNTER — Other Ambulatory Visit: Payer: Self-pay | Admitting: Internal Medicine

## 2021-07-16 ENCOUNTER — Other Ambulatory Visit: Payer: Self-pay | Admitting: Thoracic Surgery (Cardiothoracic Vascular Surgery)

## 2021-07-16 DIAGNOSIS — J90 Pleural effusion, not elsewhere classified: Secondary | ICD-10-CM

## 2021-07-18 ENCOUNTER — Ambulatory Visit (INDEPENDENT_AMBULATORY_CARE_PROVIDER_SITE_OTHER): Payer: Self-pay | Admitting: Thoracic Surgery (Cardiothoracic Vascular Surgery)

## 2021-07-18 ENCOUNTER — Ambulatory Visit
Admission: RE | Admit: 2021-07-18 | Discharge: 2021-07-18 | Disposition: A | Discharge status: Home / Self Care | Payer: 59 | Source: Ambulatory Visit | Attending: Thoracic Surgery (Cardiothoracic Vascular Surgery) | Admitting: Thoracic Surgery (Cardiothoracic Vascular Surgery)

## 2021-07-18 VITALS — BP 132/80 | HR 96 | Resp 20 | Ht 64.0 in | Wt 305.0 lb

## 2021-07-18 DIAGNOSIS — Q790 Congenital diaphragmatic hernia: Secondary | ICD-10-CM

## 2021-07-18 DIAGNOSIS — J929 Pleural plaque without asbestos: Secondary | ICD-10-CM | POA: Diagnosis not present

## 2021-07-18 DIAGNOSIS — Z09 Encounter for follow-up examination after completed treatment for conditions other than malignant neoplasm: Secondary | ICD-10-CM

## 2021-07-18 DIAGNOSIS — J984 Other disorders of lung: Secondary | ICD-10-CM | POA: Diagnosis not present

## 2021-07-18 DIAGNOSIS — R918 Other nonspecific abnormal finding of lung field: Secondary | ICD-10-CM | POA: Diagnosis not present

## 2021-07-18 DIAGNOSIS — J90 Pleural effusion, not elsewhere classified: Secondary | ICD-10-CM

## 2021-07-18 MED ORDER — TRAMADOL HCL 50 MG PO TABS: 50.0000 mg | ORAL_TABLET | Freq: Four times a day (QID) | ORAL | 0 refills | Status: DC | PRN

## 2021-07-18 NOTE — Progress Notes (Signed)
? ?   ?  [address] ?      [name] [address] ?            [phone]       ? ?Rhonda Colon ?[place] Record [id number] ?Date of Birth: 10-Apr-1975 ? ?Referring: Minus Breeding, MD ?Primary Care: Elby Showers, MD ?Primary Cardiologist:James Hochrein, MD ? ?Reason for visit:   follow-up ? ?History of Present Illness:     ?Rhonda Colon comes in for 1 month follow-up appointment.  She only complains of headaches.  She occasionally has some incisional and pleuritic pain.  She remains on her antibiotics and is set for another month with amoxicillin.  She has not noticed any changes in her respiratory status. ? ?Physical Exam: ?BP 132/80   Pulse 96   Resp 20   Ht 5\' 4"  (1.626 m)   Wt (!) 305 lb (138.3 kg)   SpO2 95% Comment: RA  BMI 52.35 kg/m?  ? ?Alert NAD ? ?Abdomen, ND ?No peripheral edema ? ? ?Diagnostic Studies & Laboratory data: ?CXR: Interval removal of right chest tube. 7.3 x 6.9 cm density at the ?medial right lung base which represented a fluid collection as on ?05/17/2021 CT chest. Remainder of the lung fields are clear. No ?pleural effusion. ?  ? ?Assessment / Plan:   ?46 year old female status post morganii hernia repair with mesh in November 2022.  This was complicated by a large fluid collection which was drained percutaneously.  She subsequently developed sepsis and  an empyema which ultimately resulted in removal of the mesh reconstruction primarily with mesh to buttress the repair.  Chest x-ray shows a persistent fluid collection, but I am not concerned by this.  If she would like a CT scan this can be ordered with plans to perform any further mentions or drainage of this fluid collection.  I will see her back in 7-month with a chest x-ray. ? ? ?Rhonda Colon ?07/18/2021 1:07 PM ? ? ? ? ? ? ?

## 2021-07-18 NOTE — Progress Notes (Signed)
Left message for patient to return call to office at [phone].  

## 2021-07-21 NOTE — Progress Notes (Signed)
?  ?Cardiology Office Note ? ? ?Date:  07/22/2021  ? ?ID:  Rhonda Colon, DOB [birth year], MRN [id number] ? ?PCP:  Margaree Mackintosh, MD  ?Cardiologist:   Rollene Rotunda, MD ? ? ? ?Chief Complaint  ?Patient presents with  ? Shortness of Breath  ? ?  ?History of Present Illness: ?Rhonda Colon is a 46 y.o. female who presents for followup after a previous MI . Since I last saw her she has had lots of with shortness of breath.  She is status post repair of Morgagni hernia.  She had a mesh in November 22.  She had a large fluid collection drained percutaneously.  She had robot-assisted right video thoracoscopy and unroofing of abscess cavity.  She was treated for sepsis.  I reviewed all these records.  She still seeing ID and I reviewed these records.  She is being managed with amoxicillin.  She is on prolonged course per their direction. ? ?She is tearful today as she has continued to have shortness of breath.  She gets short of breath with mild activity such as walking 25 yards.  We did walk around the office today and her saturations were in the low 90s but her heart rate went up from 100-122.[name]  She is not describing PND or orthopnea.  She is really not having any swelling.  He is not having any chest pressure, neck or arm discomfort.  She has had no new palpitations, presyncope or syncope. ? ? ? ?Past Medical History:  ?Diagnosis Date  ? CAD (coronary artery disease)   ? a. 07/2011 NSTEMI/Cath: RCA 99%m, otw nl cors, EF 50%, inf hk -> RCA stented with 3.0x81mm Promus DES  ? Depression   ? Hyperlipidemia   ? Hypertension   ? Morgagni hernia   ? a. Discovered incidentally on CT 07/2011 ->f/u with Dr. Lindie Spruce  ? Myocardial infarction [facility])   ? Obesity   ? Sinus bradycardia   ? a. Nocturnal, asymptomatic  ? Sun allergy   ? Tobacco abuse   ? a. quit 07/2011  ? ? ?Past Surgical History:  ?Procedure Laterality Date  ? CARDIAC CATHETERIZATION  08/10/2011  ? left heart, with angiogram; DES mid RCA  ? INTERCOSTAL NERVE BLOCK Right  05/22/2021  ? Procedure: INTERCOSTAL NERVE BLOCK;  Surgeon: Corliss Skains, MD;  Location: [facility] OR;  Service: Thoracic;  Laterality: Right;  ? IR RADIOLOGIST EVAL & MGMT  05/05/2021  ? LEFT HEART CATHETERIZATION WITH CORONARY ANGIOGRAM N/A 08/10/2011  ? Procedure: LEFT HEART CATHETERIZATION WITH CORONARY ANGIOGRAM;  Surgeon: Kathleene Hazel, MD;  Location: [facility] CATH LAB;  Service: Cardiovascular;  Laterality: N/A;  ? TONSILLECTOMY    ? ? ? ?Current Outpatient Medications  ?Medication Sig Dispense Refill  ? acetaminophen (TYLENOL) 500 MG tablet Take 500 mg by mouth every 6 (six) hours as needed for moderate pain or fever.    ? ALPRAZolam (XANAX) 1 MG tablet Take 0.5-1 tablets (0.5-1 mg total) by mouth 2 (two) times daily as needed for anxiety.    ? amoxicillin (AMOXIL) 500 MG capsule Take 1 capsule (500 mg total) by mouth 3 (three) times daily. 90 capsule 0  ? aspirin 81 MG tablet Take 81 mg by mouth daily.    ? cetirizine (ZYRTEC) 10 MG tablet Take 10 mg by mouth daily as needed for allergies.    ? fluconazole (DIFLUCAN) 100 MG tablet Take 1 tablet (100 mg total) by mouth daily as needed (patient will take if she  develops vaginal yeast infection). 10 tablet 0  ? FLUoxetine (PROZAC) 40 MG capsule Take 1 capsule by mouth once daily 90 capsule 0  ? lisinopril (ZESTRIL) 10 MG tablet Take 1 tablet (10 mg total) by mouth daily. 90 tablet 3  ? Multiple Vitamin (MULTIVITAMIN) capsule Take 1 capsule by mouth daily.    ? nitroGLYCERIN (NITROSTAT) 0.4 MG SL tablet Place 1 tablet (0.4 mg total) under the tongue every 5 (five) minutes as needed for chest pain. 25 tablet 3  ? omeprazole (PRILOSEC) 20 MG capsule Take 1 capsule by mouth once daily 90 capsule 3  ? rosuvastatin (CRESTOR) 40 MG tablet Take 1 tablet by mouth once daily 30 tablet 3  ? traMADol (ULTRAM) 50 MG tablet Take 1 tablet (50 mg total) by mouth every 6 (six) hours as needed for moderate pain. 50 tablet 0  ? clotrimazole-betamethasone (LOTRISONE) cream Apply  1 application. topically daily. (Patient not taking: Reported on 07/22/2021) 30 g 0  ? hydrocortisone cream 1 % Apply topically 2 (two) times daily as needed for itching. (Patient not taking: Reported on 07/22/2021) 30 g 0  ? ?No current facility-administered medications for this visit.  ? ? ?Allergies:   Wound dressing adhesive  ? ? ?ROS:  Please see the history of present illness.   Otherwise, review of systems are positive for none.   All other systems are reviewed and negative.  ? ? ?PHYSICAL EXAM: ?VS:  BP 132/75   Pulse (!) 104   Ht 5' 4.5" (1.638 m)   Wt (!) 303 lb 9.6 oz (137.7 kg)   SpO2 95%   BMI 51.31 kg/m?  , BMI Body mass index is 51.31 kg/m?. ?GENERAL:  Well appearing ?NECK:  No jugular venous distention, waveform within normal limits, carotid upstroke brisk and symmetric, no bruits, no thyromegaly ?LUNGS:  Clear to auscultation bilaterally ?CHEST:  Unremarkable ?HEART:  PMI not displaced or sustained,S1 and S2 within normal limits, no S3, no S4, no clicks, no rubs, no murmurs ?ABD:  Flat, positive bowel sounds normal in frequency in pitch, no bruits, no rebound, no guarding, no midline pulsatile mass, no hepatomegaly, no splenomegaly ?EXT:  2 plus pulses throughout, trace edema, no cyanosis no clubbing ? ? ? ?EKG:  EKG is  ordered today. ?The ekg ordered today demonstrates normal sinus rhythm, rate 104, axis is normal limits, intervals within normal limits, no acute ST-T wave changes. ? ? ?Recent Labs: ?07/30/2020: TSH CANCELED ?06/02/2021: Magnesium 2.0 ?06/10/2021: ALT 14 ?07/01/2021: BUN 10; Creat 0.65; Hemoglobin 14.3; Platelets 394; Potassium 4.7; Sodium 137  ? ? ?Lipid Panel ?   ?Component Value Date/Time  ? CHOL 186 11/04/2020 1110  ? TRIG 73 11/04/2020 1110  ? HDL 55 11/04/2020 1110  ? CHOLHDL 3.4 11/04/2020 1110  ? CHOLHDL 3.3 03/28/2019 0938  ? VLDL 9 06/14/2014 0907  ? LDLCALC 117 (H) 11/04/2020 1110  ? LDLCALC 103 (H) 03/28/2019 1025  ? ?  ? ?Wt Readings from Last 3 Encounters:  ?07/22/21 (!)  303 lb 9.6 oz (137.7 kg)  ?07/18/21 (!) 305 lb (138.3 kg)  ?07/01/21 (!) 302 lb (137 kg)  ?  ? ? ?Other studies Reviewed: ?Additional studies/ records that were reviewed today include: Extensive review of hospital records. ?Review of the above records demonstrates:  Please see elsewhere in the note.   ? ? ?ASSESSMENT AND PLAN: ? ?CAD:     I do not strongly suspect any acute coronary symptoms or ischemic symptoms.  She will continue with  risk reduction.  The patient has no new sypmtoms.  No further cardiovascular testing is indicated.  We will continue with aggressive risk reduction and meds as listed. ?  ?HL:     Her LDL was 117 with an HDL of 55.  She will be repeating this when she gets labs drawn at her primary provider.  I had changed her to Crestor not long ago.   ? ?SOB:   I will check l a BNP.  I would have a low threshold for echocardiography if this is abnormal.  ? ?Current medicines are reviewed at length with the patient today.  The patient does not have concerns regarding medicines. ? ?The following changes have been made:  None ? ?Labs/ tests ordered today include:    ? ?Orders Placed This Encounter  ?Procedures  ? Lipid panel  ? Hepatic function panel  ? Brain natriuretic peptide  ? EKG 12-Lead  ? ? ? ?Disposition:   FU with me in 6 months.   ? ? ?Signed, ?Rollene RotundaJames Ashonte Angelucci, MD  ?07/22/2021 4:29 PM    ?[facility] ? ? ?

## 2021-07-22 ENCOUNTER — Ambulatory Visit: Payer: 59 | Admitting: Cardiology

## 2021-07-22 ENCOUNTER — Encounter: Payer: Self-pay | Admitting: Cardiology

## 2021-07-22 VITALS — BP 132/75 | HR 104 | Ht 64.5 in | Wt 303.6 lb

## 2021-07-22 DIAGNOSIS — I2510 Atherosclerotic heart disease of native coronary artery without angina pectoris: Secondary | ICD-10-CM | POA: Diagnosis not present

## 2021-07-22 DIAGNOSIS — Z79899 Other long term (current) drug therapy: Secondary | ICD-10-CM | POA: Diagnosis not present

## 2021-07-22 DIAGNOSIS — R0602 Shortness of breath: Secondary | ICD-10-CM

## 2021-07-22 DIAGNOSIS — E785 Hyperlipidemia, unspecified: Secondary | ICD-10-CM | POA: Diagnosis not present

## 2021-07-22 NOTE — Patient Instructions (Signed)
Medication Instructions:  ?Your Physician recommend you continue on your current medication as directed.   ? ?*If you need a refill on your cardiac medications before your next appointment, please call your pharmacy* ? ? ?Lab Work: ?Please have your primary care also check a BNP ? ? ?Testing/Procedures: ?None ordered today ? ? ?Follow-Up: ?At [facility], you and your health needs are our priority.  As part of our continuing mission to provide you with exceptional heart care, we have created designated Provider Care Teams.  These Care Teams include your primary Cardiologist (physician) and Advanced Practice Providers (APPs -  Physician Assistants and Nurse Practitioners) who all work together to provide you with the care you need, when you need it. ? ?We recommend signing up for the patient portal called "MyChart".  Sign up information is provided on this After Visit Summary.  MyChart is used to connect with patients for Virtual Visits (Telemedicine).  Patients are able to view lab/test results, encounter notes, upcoming appointments, etc.  Non-urgent messages can be sent to your provider as well.   ?To learn more about what you can do with MyChart, go to [email or web address].   ? ?Your next appointment:   ?6 month(s) ? ?The format for your next appointment:   ?In Person ? ?Provider:   ?Rollene Rotunda, MD { ? ? ?Important Information About Sugar ? ? ? ? ? ? ?

## 2021-07-29 ENCOUNTER — Encounter: Payer: Self-pay | Admitting: Infectious Diseases

## 2021-07-29 ENCOUNTER — Other Ambulatory Visit: Payer: Self-pay | Admitting: Cardiology

## 2021-07-29 ENCOUNTER — Ambulatory Visit (INDEPENDENT_AMBULATORY_CARE_PROVIDER_SITE_OTHER): Payer: 59 | Admitting: Infectious Diseases

## 2021-07-29 ENCOUNTER — Other Ambulatory Visit: Payer: Self-pay

## 2021-07-29 ENCOUNTER — Encounter: Payer: Self-pay | Admitting: Cardiology

## 2021-07-29 VITALS — BP 137/88 | HR 95 | Resp 16 | Ht 64.5 in | Wt 308.2 lb

## 2021-07-29 DIAGNOSIS — K521 Toxic gastroenteritis and colitis: Secondary | ICD-10-CM

## 2021-07-29 DIAGNOSIS — J869 Pyothorax without fistula: Secondary | ICD-10-CM | POA: Diagnosis not present

## 2021-07-29 DIAGNOSIS — T3695XA Adverse effect of unspecified systemic antibiotic, initial encounter: Secondary | ICD-10-CM | POA: Diagnosis not present

## 2021-07-29 DIAGNOSIS — Z5181 Encounter for therapeutic drug level monitoring: Secondary | ICD-10-CM | POA: Diagnosis not present

## 2021-07-29 MED ORDER — AMOXICILLIN 500 MG PO CAPS: 500.0000 mg | ORAL_CAPSULE | Freq: Three times a day (TID) | ORAL | 0 refills | Status: DC

## 2021-07-29 NOTE — Progress Notes (Addendum)
? ?  ? ? ?Patient Active Problem List  ? Diagnosis Date Noted  ? Antibiotic-associated diarrhea 07/02/2021  ? Empyema lung (HCC) 07/01/2021  ? Medication monitoring encounter 07/01/2021  ? 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 Pleural effusion 05/17/2021  ? Sepsis due to intrathoracic abscess (HCC) 05/16/2021  ? Diaphragmatic hernia 02/24/2021  ? Vulvovaginitis 01/31/2021  ? Dyslipidemia 06/16/2020  ? Other fatigue 08/31/2017  ? Shortness of breath on exertion 08/31/2017  ? Essential hypertension 08/31/2017  ? Depression with anxiety 12/10/2015  ? Urticaria, solar 08/08/2013  ? CAD (coronary artery disease) 08/28/2011  ? Chronic low back pain 07/27/2011  ? ? ?Patient's Medications  ?New Prescriptions  ? No medications on file  ?Previous Medications  ? ACETAMINOPHEN (TYLENOL) 500 MG TABLET    Take 500 mg by mouth every 6 (six) hours as needed for moderate pain or fever.  ? ALPRAZOLAM (XANAX) 1 MG TABLET    Take 0.5-1 tablets (0.5-1 mg total) by mouth 2 (two) times daily as needed for anxiety.  ? AMOXICILLIN (AMOXIL) 500 MG CAPSULE    Take 1 capsule (500 mg total) by mouth 3 (three) times daily.  ? ASPIRIN 81 MG TABLET    Take 81 mg by mouth daily.  ? CETIRIZINE (ZYRTEC) 10 MG TABLET    Take 10 mg by mouth daily as needed for allergies.  ? CLOTRIMAZOLE-BETAMETHASONE (LOTRISONE) CREAM    Apply 1 application. topically daily.  ? FLUCONAZOLE (DIFLUCAN) 100 MG TABLET    Take 1 tablet (100 mg total) by mouth daily as needed (patient will take if she develops vaginal yeast infection).  ? FLUOXETINE (PROZAC) 40 MG CAPSULE    Take 1 capsule by mouth once daily  ? HYDROCORTISONE CREAM 1 %    Apply topically 2 (two) times daily as needed for itching.  ? LISINOPRIL (ZESTRIL) 10 MG TABLET    Take 1 tablet (10 mg total) by mouth daily.  ? MULTIPLE VITAMIN (MULTIVITAMIN) CAPSULE    Take 1 capsule by mouth daily.  ? NITROGLYCERIN (NITROSTAT) 0.4 MG SL TABLET    Place 1 tablet (0.4 mg total) under the tongue  every 5 (five) minutes as needed for chest pain.  ? OMEPRAZOLE (PRILOSEC) 20 MG CAPSULE    Take 1 capsule by mouth once daily  ? ROSUVASTATIN (CRESTOR) 40 MG TABLET    Take 1 tablet by mouth once daily  ? TRAMADOL (ULTRAM) 50 MG TABLET    Take 1 tablet (50 mg total) by mouth every 6 (six) hours as needed for moderate pain.  ?Modified Medications  ? No medications on file  ?Discontinued Medications  ? No medications on file  ? ? ?Subjective: ?Here for HFU for Left lung empyema and infected mesh in the setting of Morgagni hernia mesh repair  in Nov 2022. She was switched from augmentin to amoxicillin three times a day during last clinic visit. Seen by CTS Dr Cliffton Asters on 4/21 and had a chest Xray done which showed ?7.3 x 6.9 cm density at the medial right lung base which represented a fluid collection as on ?05/17/2021 CT chest.Dr Lightfoot not concerned about the persistent fluid collection and wants to follow up with a chest xray in 3 months. Saw Cardiology on 4/25. She has been taking amoxicillin three times a day. Denies missing doses. Denies fevers, chills. Denies nausea, vomiting, chest pain, cough although gets SOB when trying to hurry while walking. She continues to have loose stools 2-3 times a day,  denies abdominal cramps. No improvement in diarrhea after switching from augmentin to amoxicillin. She tells me she has not gotten back to her normal baseline, tells me there are " good days" and " bad days" ? ?Review of Systems: ?ROS all systems reviewed and negative except as stated above ? ?Past Medical History:  ?Diagnosis Date  ? CAD (coronary artery disease)   ? a. 07/2011 NSTEMI/Cath: RCA 99%m, otw nl cors, EF 50%, inf hk -> RCA stented with 3.0x3716mm Promus DES  ? Depression   ? Hyperlipidemia   ? Hypertension   ? Morgagni hernia   ? a. Discovered incidentally on CT 07/2011 ->f/u with Dr. Lindie SpruceWyatt  ? Myocardial infarction [facility](HCC)   ? Obesity   ? Sinus bradycardia   ? a. Nocturnal, asymptomatic  ? Sun allergy   ?  Tobacco abuse   ? a. quit 07/2011  ? ?Past Surgical History:  ?Procedure Laterality Date  ? CARDIAC CATHETERIZATION  08/10/2011  ? left heart, with angiogram; DES mid RCA  ? INTERCOSTAL NERVE BLOCK Right 05/22/2021  ? Procedure: INTERCOSTAL NERVE BLOCK;  Surgeon: Corliss SkainsLightfoot, Harrell O, MD;  Location: [facility] OR;  Service: Thoracic;  Laterality: Right;  ? IR RADIOLOGIST EVAL & MGMT  05/05/2021  ? LEFT HEART CATHETERIZATION WITH CORONARY ANGIOGRAM N/A 08/10/2011  ? Procedure: LEFT HEART CATHETERIZATION WITH CORONARY ANGIOGRAM;  Surgeon: Kathleene Hazelhristopher D McAlhany, MD;  [facility];  Service: Cardiovascular;  Laterality: N/A;  ? TONSILLECTOMY    ? ? ?Social History  ? ?Tobacco Use  ? Smoking status: Former  ?  Packs/day: 1.00  ?  Years: 18.00  ?  Pack years: 18.00  ?  Types: Cigarettes  ?  Quit date: 08/09/2011  ?  Years since quitting: 9.9  ? Smokeless tobacco: Never  ? Tobacco comments:  ?  2013  ?Vaping Use  ? Vaping Use: Never used  ?Substance Use Topics  ? Alcohol use: Yes  ?  Alcohol/week: 4.0 standard drinks  ?  Types: 4 Glasses of wine per week  ?  Comment: occasionally  ? Drug use: No  ? ? ?Family History  ?Problem Relation Age of Onset  ? Coronary artery disease Mother [id number]  ?      stent  ? Heart attack Mother   ? Heart disease Mother   ? Hyperlipidemia Mother   ? Obesity Mother   ? Colon cancer Maternal Uncle   ? Arrhythmia Father   ? Depression Father   ? Coronary artery disease Maternal Aunt [id number]  ?     (Mother's twin sister)  ? Lung cancer Maternal Aunt   ? Coronary artery disease Maternal Uncle 46  ? Heart attack Maternal Aunt   ?     mother's twin  ? Heart attack Maternal Uncle   ? Breast cancer Maternal Grandmother   ? Cancer Neg Hx   ? Early death Neg Hx   ? Hypertension Neg Hx   ? Kidney disease Neg Hx   ? Stroke Neg Hx   ? Alcohol abuse Neg Hx   ? Diabetes Neg Hx   ? Drug abuse Neg Hx   ? ? ?Allergies  ?Allergen Reactions  ? Wound Dressing Adhesive Dermatitis and Rash  ? ? ?Health Maintenance  ?Topic Date  Due  ? Hepatitis C Screening  Never done  ? COLONOSCOPY (Pts [id number] Insurance coverage will need to be confirmed)  Never done  ? COVID-19 Vaccine (3 - Moderna risk series) 08/11/2019  ? INFLUENZA VACCINE  10/28/2021  ? MAMMOGRAM  01/21/2022  ? TETANUS/TDAP  08/09/2023  ? PAP SMEAR-Modifier  08/10/2023  ? HIV Screening  Completed  ? HPV VACCINES  Aged Out  ? ? ?Objective: ?BP 137/88   Pulse 95   Resp 16   Ht 5' 4.5" (1.638 m)   Wt (!) 308 lb 3.2 oz (139.8 kg)   SpO2 99%   BMI 52.09 kg/m?  ? ? ?Physical Exam ?Constitutional:   ?   Appearance: Normal appearance. Morbidly obese  ?HENT:  ?   Head: Normocephalic and atraumatic.   ?   Mouth: Mucous membranes are moist.  ?Eyes: ?   Conjunctiva/sclera: Conjunctivae normal.  ?   Pupils:  ? ?Cardiovascular:  ?   Rate and Rhythm: Normal rate and regular rhythm.  ?   Heart sounds:  ? ?Pulmonary:  ?   Effort: Pulmonary effort is normal.  ?   Breath sounds: Normal breath sounds.  ? ?Abdominal:  ?   General: Non distended  ?   Palpations: soft.  ? ?Musculoskeletal:     ?   General: Normal range of motion.  ? ?Skin: ?   General: Skin is warm and dry.  ?   Comments:  ? ?Neurological:  ?   General: grossly non focal  ?   Mental Status: awake, alert and oriented to person, place, and time.  ? ?Psychiatric:     ?   Mood and Affect: Mood normal.  ? ?Lab Results ?Lab Results  ?Component Value Date  ? WBC 9.9 07/01/2021  ? HGB 14.3 07/01/2021  ? HCT 43.6 07/01/2021  ? MCV 86.9 07/01/2021  ? PLT 394 07/01/2021  ?  ?Lab Results  ?Component Value Date  ? CREATININE 0.65 07/01/2021  ? BUN 10 07/01/2021  ? NA 137 07/01/2021  ? K 4.7 07/01/2021  ? CL 101 07/01/2021  ? CO2 26 07/01/2021  ?  ?Lab Results  ?Component Value Date  ? ALT 14 06/10/2021  ? AST 21 06/10/2021  ? ALKPHOS 73 05/24/2021  ? BILITOT 0.4 06/10/2021  ?  ?Lab Results  ?Component Value Date  ? CHOL 186 11/04/2020  ? HDL 55 11/04/2020  ? LDLCALC 117 (H) 11/04/2020  ? TRIG 73 11/04/2020  ? CHOLHDL 3.4 11/04/2020  ? ?Lab  Results  ?Component Value Date  ? LABRPR NON-REACTIVE 08/09/2020  ? ?No results found for: HIV1RNAQUANT, HIV1RNAVL, CD4TABS ? ?Imaging ?DG Chest 2 View ? ?Result Date: 07/18/2021 ?CLINICAL DATA:  Follow-up pleural

## 2021-07-30 ENCOUNTER — Telehealth: Payer: Self-pay

## 2021-07-30 LAB — CBC
HCT: 44.3 % (ref 35.0–45.0)
Hemoglobin: 14.7 g/dL (ref 11.7–15.5)
MCH: 29.5 pg (ref 27.0–33.0)
MCHC: 33.2 g/dL (ref 32.0–36.0)
MCV: 88.8 fL (ref 80.0–100.0)
MPV: 11.5 fL (ref 7.5–12.5)
Platelets: 347 10*3/uL (ref 140–400)
RBC: 4.99 10*6/uL (ref 3.80–5.10)
RDW: 14.9 % (ref 11.0–15.0)
WBC: 10.4 10*3/uL (ref 3.8–10.8)

## 2021-07-30 LAB — BASIC METABOLIC PANEL
BUN: 10 mg/dL (ref 7–25)
CO2: 27 mmol/L (ref 20–32)
Calcium: 9.4 mg/dL (ref 8.6–10.2)
Chloride: 103 mmol/L (ref 98–110)
Creat: 0.63 mg/dL (ref 0.50–0.99)
Glucose, Bld: 99 mg/dL (ref 65–99)
Potassium: 4.4 mmol/L (ref 3.5–5.3)
Sodium: 139 mmol/L (ref 135–146)

## 2021-07-30 LAB — C-REACTIVE PROTEIN: CRP: 34.2 mg/L — ABNORMAL HIGH (ref <8.0)

## 2021-07-30 LAB — SEDIMENTATION RATE: Sed Rate: 11 mm/h (ref 0–20)

## 2021-07-30 MED ORDER — ROSUVASTATIN CALCIUM 40 MG PO TABS: 40.0000 mg | ORAL_TABLET | Freq: Every day | ORAL | 3 refills | Status: DC

## 2021-07-30 MED ORDER — LISINOPRIL 10 MG PO TABS: 10.0000 mg | ORAL_TABLET | Freq: Every day | ORAL | 3 refills | Status: DC

## 2021-07-30 NOTE — Telephone Encounter (Signed)
Called patient to relay results, no answer. Left message stating MyChart message will be sent and to please call with any questions. ? ?Sandie Ano, RN ? ?

## 2021-07-30 NOTE — Telephone Encounter (Signed)
-----   Message from Odette Fraction, MD sent at 07/30/2021  8:26 AM EDT ----- ?Let her know one of the inflammatory markers is little higher than previous values ( non specific), rest of the labs OK.  ? ?Continue amoxicillin as prescribed and fu in a month.  ?

## 2021-07-31 NOTE — Telephone Encounter (Signed)
The original prescription was reordered on 07/30/2021 by Darene Lamer, LPN. ?

## 2021-08-08 ENCOUNTER — Other Ambulatory Visit: Payer: 59

## 2021-08-08 DIAGNOSIS — R5383 Other fatigue: Secondary | ICD-10-CM

## 2021-08-08 DIAGNOSIS — R0602 Shortness of breath: Secondary | ICD-10-CM

## 2021-08-08 DIAGNOSIS — I10 Essential (primary) hypertension: Secondary | ICD-10-CM | POA: Diagnosis not present

## 2021-08-08 DIAGNOSIS — Z79899 Other long term (current) drug therapy: Secondary | ICD-10-CM

## 2021-08-08 NOTE — Addendum Note (Signed)
Addended by: Jama Flavors on: 08/08/2021 09:23 AM ? ? Modules accepted: Orders ? ?

## 2021-08-09 LAB — LIPID PANEL
Cholesterol: 143 mg/dL (ref <200)
HDL: 56 mg/dL (ref >=50)
LDL Cholesterol (Calc): 71 mg/dL (calc)
Non-HDL Cholesterol (Calc): 87 mg/dL (calc) (ref <130)
Total CHOL/HDL Ratio: 2.6 (calc) (ref <5.0)
Triglycerides: 76 mg/dL (ref <150)

## 2021-08-09 LAB — CBC WITH DIFFERENTIAL/PLATELET
Absolute Monocytes: 733 cells/uL (ref 200–950)
Basophils Absolute: 69 cells/uL (ref 0–200)
Basophils Relative: 0.7 %
Eosinophils Absolute: 188 cells/uL (ref 15–500)
Eosinophils Relative: 1.9 %
HCT: 43.7 % (ref 35.0–45.0)
Hemoglobin: 14.4 g/dL (ref 11.7–15.5)
Lymphs Abs: 3336 cells/uL (ref 850–3900)
MCH: 28.8 pg (ref 27.0–33.0)
MCHC: 33 g/dL (ref 32.0–36.0)
MCV: 87.4 fL (ref 80.0–100.0)
MPV: 11.1 fL (ref 7.5–12.5)
Monocytes Relative: 7.4 %
Neutro Abs: 5574 cells/uL (ref 1500–7800)
Neutrophils Relative %: 56.3 %
Platelets: 313 10*3/uL (ref 140–400)
RBC: 5 10*6/uL (ref 3.80–5.10)
RDW: 14.6 % (ref 11.0–15.0)
Total Lymphocyte: 33.7 %
WBC: 9.9 10*3/uL (ref 3.8–10.8)

## 2021-08-09 LAB — COMPLETE METABOLIC PANEL WITH GFR
AG Ratio: 1.5 (calc) (ref 1.0–2.5)
ALT: 24 U/L (ref 6–29)
AST: 28 U/L (ref 10–35)
Albumin: 4.1 g/dL (ref 3.6–5.1)
Alkaline phosphatase (APISO): 80 U/L (ref 31–125)
BUN: 7 mg/dL (ref 7–25)
CO2: 24 mmol/L (ref 20–32)
Calcium: 9 mg/dL (ref 8.6–10.2)
Chloride: 102 mmol/L (ref 98–110)
Creat: 0.65 mg/dL (ref 0.50–0.99)
Globulin: 2.8 g/dL (calc) (ref 1.9–3.7)
Glucose, Bld: 94 mg/dL (ref 65–99)
Potassium: 4.8 mmol/L (ref 3.5–5.3)
Sodium: 137 mmol/L (ref 135–146)
Total Bilirubin: 0.4 mg/dL (ref 0.2–1.2)
Total Protein: 6.9 g/dL (ref 6.1–8.1)
eGFR: 109 mL/min/{1.73_m2} (ref >=60)

## 2021-08-09 LAB — TSH: TSH: 1.88 mIU/L

## 2021-08-09 LAB — BRAIN NATRIURETIC PEPTIDE: Brain Natriuretic Peptide: 29 pg/mL (ref <100)

## 2021-08-11 ENCOUNTER — Encounter: Payer: Self-pay | Admitting: Internal Medicine

## 2021-08-11 ENCOUNTER — Ambulatory Visit (INDEPENDENT_AMBULATORY_CARE_PROVIDER_SITE_OTHER): Payer: 59 | Admitting: Internal Medicine

## 2021-08-11 DIAGNOSIS — R7301 Impaired fasting glucose: Secondary | ICD-10-CM

## 2021-08-11 DIAGNOSIS — I252 Old myocardial infarction: Secondary | ICD-10-CM | POA: Diagnosis not present

## 2021-08-11 DIAGNOSIS — F411 Generalized anxiety disorder: Secondary | ICD-10-CM | POA: Diagnosis not present

## 2021-08-11 DIAGNOSIS — Z1159 Encounter for screening for other viral diseases: Secondary | ICD-10-CM

## 2021-08-11 DIAGNOSIS — R6889 Other general symptoms and signs: Secondary | ICD-10-CM | POA: Diagnosis not present

## 2021-08-11 DIAGNOSIS — Z0000 Encounter for general adult medical examination without abnormal findings: Secondary | ICD-10-CM | POA: Diagnosis not present

## 2021-08-11 DIAGNOSIS — I10 Essential (primary) hypertension: Secondary | ICD-10-CM | POA: Diagnosis not present

## 2021-08-11 DIAGNOSIS — K760 Fatty (change of) liver, not elsewhere classified: Secondary | ICD-10-CM

## 2021-08-11 LAB — POCT URINALYSIS DIPSTICK
Bilirubin, UA: NEGATIVE
Blood, UA: NEGATIVE
Glucose, UA: NEGATIVE
Ketones, UA: NEGATIVE
Leukocytes, UA: NEGATIVE
Nitrite, UA: NEGATIVE
Protein, UA: NEGATIVE
Spec Grav, UA: 1.02 (ref 1.010–1.025)
Urobilinogen, UA: 0.2 E.U./dL
pH, UA: 6 (ref 5.0–8.0)

## 2021-08-11 LAB — POC COVID19 BINAXNOW: SARS Coronavirus 2 Ag: NEGATIVE

## 2021-08-11 NOTE — Patient Instructions (Addendum)
Please see GI physician for consult regarding screening colonoscopy. I recommend swimming for exercise. Hgb AIC done today. Info given on Dr. Francena Hanly clinic. Recommend Pneumococcal 20 vaccine this Fall.  Follow-up here in 4 months.  Hemoglobin A1c pending.  Hepatitis C antibody drawn today. ?

## 2021-08-11 NOTE — Progress Notes (Signed)
? ?Subjective:  ? ? Patient ID: Rhonda Colon, female    DOB: April 03, 1975, 46 y.o.   MRN: [id number] ? ?HPI 46 year old Female seen for health maintenance exam and evaluation of medical issues. Saw Dr. Antoine Poche recently. He suggested swimming and I agree with this. ? 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  Was discharged the following day.  She had telephone visit with Dr. Cliffton Asters on December 9.  Was doing well postoperatively except for some bloating.  Had improved oxygenation.  Was advised to follow-up in 1 month.  Had in person visit with Dr. Cliffton Asters in January and chest x-ray suggested hernia had recurred.  CT scan was performed and did not show recurrence of hernia.  Had telehealth visit with Dr. Cliffton Asters mid January and CT scan results were discussed.  She was told there was no recurrence of her hernia and that there was a large fluid collection likely a seroma that filled the hernia space.  CT-guided drain placement was recommended.  She did undergo this procedure.  About 1 L of fluid was removed with improvement in respiratory status. ? ?However she presented here on February 17 with fever, chills, headache, myalgias for several days.  Rapid flu and rapid COVID test were negative.  Temp was 99.3 degrees pulse 105 regular blood pressure 100/70.  Her pulse oximetry was 97%.  She was readmitted to the hospital on February 17, was placed on broad-spectrum antibiotics and underwent interventional radiology placement of drain into the right thoracic area and 300 cc of purulent fluid was drained growing Streptococcus mitis and a few Actinomyces neuii.  The patient had developed an abscess that was in continuity with the previous mesh that was placed in November 2022.  She underwent robotic assisted unroofing of abscess cavity and placement of pleural irrigation system by Dr. Cliffton Asters May 22, 2021.  Then on March 6 she had video  thoracoscopy to unroofed the abscess cavity and was discharged home finally on June 05, 2021.  She was prescribed Augmentin 875 mg / 125 mg at discharge to take twice daily for 28 days and was given tramadol for pain.  Was seen here in follow-up on March 14. ? ?Seems to be doing pretty well at the present time.  Not walking is much as I think she should at this time.  She enjoys swimming and was encouraged to swim. ? ?She has a remote history of MI in 2013 after which she quit smoking.  History of hypertension and hyperlipidemia.  Followed by Dr. Antoine Poche as well. ? ?Social history: She is single.  Social alcohol consumption.  She works as a Veterinary surgeon. ? ?Family history: Colon cancer in maternal uncle and great grandfather.  Maternal aunt died with history of coronary artery disease, valvular disease and lung cancer in 2019. ? ?At 1 point patient lost 100 pounds and was noted to be 158 pounds with Dr. Antoine Poche saw her in 2014. ? ?Says she has to look after her mother who is elderly and has several medical issues.  Is able to do work remotely with her business with the assistance of computer.  Does not have to go into the office all that much. ? ? ? ?Still has elevated C-reactive protein. Infectious Disease physician thinks this is from inflammation.  She is on amoxicillin 500 mg 3 times a day as of May 2 for 30 days per infectious disease physician. ? ?Had mammogram October 2022.  Will see GYN soon. Had pneumococcal 20 Aug 2011. ? ?Remains on Crestor 40 mg daily, lisinopril 10 mg daily, Prozac 40 mg daily and Xanax as needed for anxiety.  Also was on Prilosec 20 mg daily.  Takes aspirin 81 mg daily.  Has tramadol to take sparingly for pain. ? ?Says she recently saw GYN physician. Epic notes indicate she say Dr. Oscar La in 2022  and had Pap smear at that time.  Had mammogram October 2022. ? ?Review of Systems Has headaches since the infection. They are getting better. Rotates ibuprofen and Tylenol. Gets some relief with  these. Zyrtec does not help. She is worried about persistent infection but has no fever or chills. Thinks headaches are related to the persistent infection.Remains on Amoxicillin per ID physician. Eating fish, and salad. ? ?Complaining of some throat congestion.  Rapid COVID test today is negative. ? ?   ?Objective:  ? Physical Exam ?Blood pressure 126/72 pulse 94 temperature 97.8 degrees pulse oximetry 98% weight 305 pounds BMI 51.54.  In October 2022 she weighed 282 pounds and BMI was 46.93 Therefore, she has gained 23 pounds. ? ?Skin: Warm and dry.  No cervical adenopathy.  No thyromegaly.  Chest is clear.  Breast are without masses.  Cardiac exam: Regular rate and rhythm.  Abdomen obese soft nondistended without apparent hepatosplenomegaly masses or tenderness.  No lower extremity pitting edema.  Pelvic exam deferred to Dr. Oscar La.  Brief neurological exam is intact without gross focal deficits.  Affect is somewhat depressed. ? ? ? ?   ?Assessment & Plan:  ?Morbid obesity with BMI 51.54.  Has gained 23 pounds since October 2022.  Encouraged swimming.  I think it could help her lose the weight.  She has been through a tough time with her recent surgery. ? ?History of hypertension-blood pressure stable and under good control at 126/72 ? ?Hyperlipidemia-currently on Crestor 40 mg daily and labs are stable ? ?History of depression treated with Prozac 40 mg daily ? ?History of anxiety treated with Xanax ? ?History of GE reflux treated with Prilosec ? ?Remote history of MI followed by Dr. Antoine Poche ? ?Health maintenance-needs screening colonoscopy ? ?Acute URI.  Rapid COVID test is negative.  Already on antibiotics per infectious disease physician. ? ?Plan: She still has follow-up scheduled with Infectious disease physician.  She has an appointment with GYN physician in June.  She has an appoint with Dr. Antoine Poche in October.  I would like to see her again in 4 months to see if she is making some progress.  Referral  being made to [facility] regarding screening colonoscopy and recommend swimming for exercise.  Hemoglobin A1c is pending.  Was given information on Dr. Francena Hanly clinic.  Recommend Pneumococcal 20 vaccine this fall. ? ?

## 2021-08-12 LAB — HEMOGLOBIN A1C
Hgb A1c MFr Bld: 5.5 % of total Hgb (ref <5.7)
Mean Plasma Glucose: 111 mg/dL
eAG (mmol/L): 6.2 mmol/L

## 2021-08-12 LAB — HEPATITIS C ANTIBODY
Hepatitis C Ab: NONREACTIVE
SIGNAL TO CUT-OFF: 0.09 (ref <1.00)

## 2021-08-26 ENCOUNTER — Encounter: Payer: Self-pay | Admitting: Cardiology

## 2021-09-01 ENCOUNTER — Other Ambulatory Visit: Payer: Self-pay

## 2021-09-01 ENCOUNTER — Ambulatory Visit (INDEPENDENT_AMBULATORY_CARE_PROVIDER_SITE_OTHER): Payer: 59 | Admitting: Infectious Diseases

## 2021-09-01 ENCOUNTER — Encounter: Payer: Self-pay | Admitting: Infectious Diseases

## 2021-09-01 VITALS — BP 145/86 | HR 102 | Temp 97.4°F | Ht 64.0 in | Wt 300.0 lb

## 2021-09-01 DIAGNOSIS — T3695XA Adverse effect of unspecified systemic antibiotic, initial encounter: Secondary | ICD-10-CM

## 2021-09-01 DIAGNOSIS — T8579XD Infection and inflammatory reaction due to other internal prosthetic devices, implants and grafts, subsequent encounter: Secondary | ICD-10-CM | POA: Diagnosis not present

## 2021-09-01 DIAGNOSIS — K521 Toxic gastroenteritis and colitis: Secondary | ICD-10-CM | POA: Diagnosis not present

## 2021-09-01 DIAGNOSIS — Z5181 Encounter for therapeutic drug level monitoring: Secondary | ICD-10-CM | POA: Diagnosis not present

## 2021-09-01 DIAGNOSIS — J869 Pyothorax without fistula: Secondary | ICD-10-CM

## 2021-09-01 DIAGNOSIS — N760 Acute vaginitis: Secondary | ICD-10-CM

## 2021-09-01 NOTE — Progress Notes (Addendum)
Patient Active Problem List   Diagnosis Date Noted   Antibiotic-associated diarrhea 07/02/2021   Empyema lung ([place]) 07/01/2021   Medication monitoring encounter 07/01/2021   Infected prosthetic mesh of abdominal wall ([place]) 07/01/2021   Pleural effusion 05/17/2021   Sepsis due to intrathoracic abscess ([place]) 05/16/2021   Diaphragmatic hernia 02/24/2021   Vulvovaginitis 01/31/2021   Dyslipidemia 06/16/2020   Other fatigue 08/31/2017   Shortness of breath on exertion 08/31/2017   Essential hypertension 08/31/2017   Depression with anxiety 12/10/2015   Urticaria, solar 08/08/2013   CAD (coronary artery disease) 08/28/2011   Chronic low back pain 07/27/2011   Current Outpatient Medications on File Prior to Visit  Medication Sig Dispense Refill   acetaminophen (TYLENOL) 500 MG tablet Take 500 mg by mouth every 6 (six) hours as needed for moderate pain or fever.     ALPRAZolam (XANAX) 1 MG tablet Take 0.5-1 tablets (0.5-1 mg total) by mouth 2 (two) times daily as needed for anxiety.     amoxicillin (AMOXIL) 500 MG capsule Take 1 capsule (500 mg total) by mouth 3 (three) times daily. 90 capsule 0   aspirin 81 MG tablet Take 81 mg by mouth daily.     cetirizine (ZYRTEC) 10 MG tablet Take 10 mg by mouth daily as needed for allergies.     fluconazole (DIFLUCAN) 100 MG tablet Take 1 tablet (100 mg total) by mouth daily as needed (patient will take if she develops vaginal yeast infection). 10 tablet 0   FLUoxetine (PROZAC) 40 MG capsule Take 1 capsule by mouth once daily 90 capsule 0   lisinopril (ZESTRIL) 10 MG tablet Take 1 tablet (10 mg total) by mouth daily. 90 tablet 3   Multiple Vitamin (MULTIVITAMIN) capsule Take 1 capsule by mouth daily.     nitroGLYCERIN (NITROSTAT) 0.4 MG SL tablet Place 1 tablet (0.4 mg total) under the tongue every 5 (five) minutes as needed for chest pain. 25 tablet 3   omeprazole (PRILOSEC) 20 MG capsule Take 1 capsule by mouth once daily 90 capsule 3    rosuvastatin (CRESTOR) 40 MG tablet Take 1 tablet (40 mg total) by mouth daily. 30 tablet 3   No current facility-administered medications on file prior to visit.    Subjective: Here for HFU for Left lung empyema and infected mesh in the setting of Morgagni hernia mesh repair  in Nov 2022. She was switched from augmentin to amoxicillin three times a day during last clinic visit. Seen by CTS Dr Kipp Brood on 4/21 and had a chest Xray done which showed 7.3 x 6.9 cm density at the medial right lung base which represented a fluid collection as on 05/17/2021 CT chest.Dr Lightfoot not concerned about the persistent fluid collection and wants to follow up with a chest xray in 3 months. Saw Cardiology on 4/25. She has been taking amoxicillin three times a day. Denies missing doses. Denies fevers, chills. Denies nausea, vomiting, chest pain, cough although gets SOB when trying to hurry while walking. She continues to have loose stools 2-3 times a day, denies abdominal cramps. No improvement in diarrhea after switching from augmentin to amoxicillin. She tells me she has not gotten back to her normal baseline, tells me there are " good days" and " bad days"  09/01/21 Taking amoxicillin 549m three times a day. Denies missing doses. Had an episode of cold approx 2 weeks ago which has resolved. SARSCov2 negative. Saw PCP recently ( notes reviewed).  Diarrhea has  improved, only 2-3 episodes of loose stool a day. Still has headache but it is manageable with tylenol and ibuprofen. Cough is minimal with clear phlegm at times. No changes in breathing. Denies nausea, vomiting but has abdominal pain at times. She is aware about fu with Dr Kipp Brood in approx  1.5 month. She would like to stop abtx as she is also starting to having vaginal yeast infections. Feels getting better overall.   Review of Systems: ROS all systems reviewed and negative except as stated above  Past Medical History:  Diagnosis Date   CAD (coronary  artery disease)    a. 07/2011 NSTEMI/Cath: RCA 99%m, otw nl cors, EF 50%, inf hk -> RCA stented with 3.0x75m Promus DES   Depression    Hyperlipidemia    Hypertension    Morgagni hernia    a. Discovered incidentally on CT 07/2011 ->f/u with Dr. WHulen Skains  Myocardial infarction ([facility]    Obesity    Sinus bradycardia    a. Nocturnal, asymptomatic   Sun allergy    Tobacco abuse    a. quit 07/2011   Past Surgical History:  Procedure Laterality Date   CARDIAC CATHETERIZATION  08/10/2011   left heart, with angiogram; DES mid RCA   INTERCOSTAL NERVE BLOCK Right 05/22/2021   Procedure: INTERCOSTAL NERVE BLOCK;  Surgeon: LLajuana Matte MD;  [place] [facility]  Service: Thoracic;  Laterality: Right;   IR RADIOLOGIST EVAL & MGMT  05/05/2021   LEFT HEART CATHETERIZATION WITH CORONARY ANGIOGRAM N/A 08/10/2011   Procedure: LEFT HEART CATHETERIZATION WITH CORONARY ANGIOGRAM;  Surgeon: CBurnell Blanks MD;  [facility];  Service: Cardiovascular;  Laterality: N/A;   TONSILLECTOMY      Social History   Tobacco Use   Smoking status: Former    Packs/day: 1.00    Years: 18.00    Pack years: 18.00    Types: Cigarettes    Quit date: 08/09/2011    Years since quitting: 10.0   Smokeless tobacco: Never   Tobacco comments:    2013  Vaping Use   Vaping Use: Never used  Substance Use Topics   Alcohol use: Yes    Alcohol/week: 4.0 standard drinks    Types: 4 Glasses of wine per week    Comment: occasionally   Drug use: No    Family History  Problem Relation Age of Onset   Coronary artery disease Mother 580       stent   Heart attack Mother    Heart disease Mother    Hyperlipidemia Mother    Obesity Mother    Colon cancer Maternal Uncle    Arrhythmia Father    Depression Father    Coronary artery disease Maternal Aunt 61       (Mother's twin sister)   Lung cancer Maternal Aunt    Coronary artery disease Maternal Uncle 46   Heart attack Maternal Aunt        mother's twin    Heart attack Maternal Uncle    Breast cancer Maternal Grandmother    Cancer Neg Hx    Early death Neg Hx    Hypertension Neg Hx    Kidney disease Neg Hx    Stroke Neg Hx    Alcohol abuse Neg Hx    Diabetes Neg Hx    Drug abuse Neg Hx     Allergies  Allergen Reactions   Wound Dressing Adhesive Dermatitis and Rash    Health Maintenance  Topic Date  Due   COLONOSCOPY (Pts 45-71yr Insurance coverage will need to be confirmed)  Never done   INFLUENZA VACCINE  10/28/2021   MAMMOGRAM  01/21/2022   TETANUS/TDAP  08/09/2023   PAP SMEAR-Modifier  08/10/2023   Hepatitis C Screening  Completed   HIV Screening  Completed   HPV VACCINES  Aged Out   COVID-19 Vaccine  Discontinued    Objective: BP (!) 145/86   Pulse (!) 102   Temp (!) 97.4 F (36.3 C) (Oral)   Ht 5' 4" (1.626 m)   Wt 300 lb (136.1 kg)   SpO2 97%   BMI 51.49 kg/m   Physical Exam Constitutional:      Appearance: Normal appearance. Morbidly obese  HENT:     Head: Normocephalic and atraumatic.      Mouth: Mucous membranes are moist.  Eyes:    Conjunctiva/sclera: Conjunctivae normal.     Pupils:   Cardiovascular:     Rate and Rhythm: Normal rate and regular rhythm.     Heart sounds:   Pulmonary:     Effort: Pulmonary effort is normal.     Breath sounds: Normal breath sounds.   Abdominal:     General: Non distended     Palpations: soft.   Musculoskeletal:        General: Normal range of motion.   Skin:    General: Skin is warm and dry.     Comments:   Neurological:     General: grossly non focal     Mental Status: awake, alert and oriented to person, place, and time.   Psychiatric:        Mood and Affect: Mood normal.   Lab Results Lab Results  Component Value Date   WBC 9.9 08/08/2021   HGB 14.4 08/08/2021   HCT 43.7 08/08/2021   MCV 87.4 08/08/2021   PLT 313 08/08/2021    Lab Results  Component Value Date   CREATININE 0.65 08/08/2021   BUN 7 08/08/2021   NA 137 08/08/2021   K  4.8 08/08/2021   CL 102 08/08/2021   CO2 24 08/08/2021    Lab Results  Component Value Date   ALT 24 08/08/2021   AST 28 08/08/2021   ALKPHOS 73 05/24/2021   BILITOT 0.4 08/08/2021    Lab Results  Component Value Date   CHOL 143 08/08/2021   HDL 56 08/08/2021   LDLCALC 71 08/08/2021   TRIG 76 08/08/2021   CHOLHDL 2.6 08/08/2021   Lab Results  Component Value Date   LABRPR NON-REACTIVE 08/09/2020   No results found for: HIV1RNAQUANT, HIV1RNAVL, CD4TABS  Imaging No results found.  Problem List Items Addressed This Visit       Respiratory   Empyema lung ([place]     Digestive   Antibiotic-associated diarrhea     Genitourinary   Vulvovaginitis     Other   Medication monitoring encounter - Primary   In[place] of [place] ([place]     Assessment # Left lung empyema + Infected mesh in the setting of 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 Path with no malignancy  identified  Chest Xray 07/18/21 7.3 x 6.9 cm density at the medial right lung base which represented a fluid collection as on 05/17/2021 CT chest.  # Medication Monitoring  # Antibiotic associated diarrhea  # Candidal Vulvovaginitis   Plan DC amoxicillin On fluconazole for vaginal yeast infection Will not do labs as stopping antibiotics and recent labs 5/12 CBC and CMP unremarkable  Fu with Cardiology and CT surgery as instructed Fu as needed/worsening symptoms   I have personally spent 42 minutes involved in face-to-face and  non-face-to-face activities for this patient on the day of the visit including counseling of  the patient and coordination of care.   Wilber Oliphant, [place] [facility] 09/01/2021, 9:19 AM

## 2021-09-02 ENCOUNTER — Encounter: Payer: Self-pay | Admitting: Internal Medicine

## 2021-09-02 ENCOUNTER — Ambulatory Visit: Payer: 59 | Admitting: Internal Medicine

## 2021-09-02 VITALS — BP 140/80 | HR 104 | Ht 64.0 in | Wt 305.4 lb

## 2021-09-02 DIAGNOSIS — K219 Gastro-esophageal reflux disease without esophagitis: Secondary | ICD-10-CM

## 2021-09-02 DIAGNOSIS — K649 Unspecified hemorrhoids: Secondary | ICD-10-CM

## 2021-09-02 DIAGNOSIS — Z1211 Encounter for screening for malignant neoplasm of colon: Secondary | ICD-10-CM

## 2021-09-02 NOTE — Progress Notes (Signed)
Chief Complaint: Colon cancer screening  HPI : 46 year old female with history of obesity, CAD s/p PCI, diaphragmatic hernia s/p repair c/b left lung empyema and infected mesh, GERD, anxiety/depression presents to discuss colon cancer screening  She is here to discuss colon cancer screening. She has had hemorrhoids for the last 15 years. The hemorrhoids get flared up on occasion. About a month ago she did have some issues with rectal pain and rectal bleeding while she was having diarrhea. This bleeding has since then resolved. Maternal uncle and maternal grandmother had colon cancer. Has fam hx of colon polyps. Endorses some GERD that is well controlled. She has never had a colonoscopy in the past. Denies N&V, dysphagia, abdominal pain, diarrhea, constipation. Lost some weight with her recent infection and then she gained the weight back. She takes a baby aspirin. Denies use of blood thinners.  Wt Readings from Last 3 Encounters:  09/02/21 (!) 305 lb 6 oz (138.5 kg)  09/01/21 300 lb (136.1 kg)  08/11/21 (!) 305 lb (138.3 kg)   Past Medical History:  Diagnosis Date   Anxiety    CAD (coronary artery disease)    a. 07/2011 NSTEMI/Cath: RCA 99%m, otw nl cors, EF 50%, inf hk -> RCA stented with 3.0x65mm Promus DES   Depression    Diaphragmatic hernia    GERD (gastroesophageal reflux disease)    Hyperlipidemia    Hypertension    Morgagni hernia    a. Discovered incidentally on CT 07/2011 ->f/u with Dr. Lindie Spruce   Myocardial infarction [facility])    Obesity    Sepsis ([facility])    Sinus bradycardia    a. Nocturnal, asymptomatic   Sun allergy    Tobacco abuse    a. quit 07/2011     Past Surgical History:  Procedure Laterality Date   CARDIAC CATHETERIZATION  08/10/2011   left heart, with angiogram; DES mid RCA   DIAPHRAGMATIC HERNIA REPAIR  01/2021   with mesh   DIAPHRAGMATIC HERNIA REPAIR  06/02/2021   with mesh removal due to sepsis   INTERCOSTAL NERVE BLOCK Right 05/22/2021   Procedure:  INTERCOSTAL NERVE BLOCK;  Surgeon: Corliss Skains, MD;  [facility];  Service: Thoracic;  Laterality: Right;   IR RADIOLOGIST EVAL & MGMT  05/05/2021   LEFT HEART CATHETERIZATION WITH CORONARY ANGIOGRAM N/A 08/10/2011   Procedure: LEFT HEART CATHETERIZATION WITH CORONARY ANGIOGRAM;  Surgeon: Kathleene Hazel, MD;  [facility];  Service: Cardiovascular;  Laterality: N/A;   TONSILLECTOMY     Family History  Problem Relation Age of Onset   Coronary artery disease Mother 58        stent   Heart attack Mother    Heart disease Mother    Hyperlipidemia Mother    Obesity Mother    Colon polyps Mother    Arrhythmia Father    Depression Father    Breast cancer Maternal Grandmother    Coronary artery disease Maternal Aunt 62       (Mother's twin sister)   Lung cancer Maternal Aunt    Heart attack Maternal Aunt        mother's twin   Colon polyps Maternal Aunt    Colon cancer Maternal Uncle    Coronary artery disease Maternal Uncle 46   Heart attack Maternal Uncle    Breast cancer Cousin 79   Cancer Neg Hx    Early death Neg Hx    Hypertension Neg Hx    Kidney disease  Neg Hx    Stroke Neg Hx    Alcohol abuse Neg Hx    Diabetes Neg Hx    Drug abuse Neg Hx    Social History   Tobacco Use   Smoking status: Former    Packs/day: 1.00    Years: 18.00    Pack years: 18.00    Types: Cigarettes    Quit date: 08/09/2011    Years since quitting: 10.0   Smokeless tobacco: Never   Tobacco comments:    2013  Vaping Use   Vaping Use: Never used  Substance Use Topics   Alcohol use: Yes    Alcohol/week: 4.0 standard drinks    Types: 4 Glasses of wine per week    Comment: occasionally   Drug use: No   Current Outpatient Medications  Medication Sig Dispense Refill   acetaminophen (TYLENOL) 500 MG tablet Take 500 mg by mouth every 6 (six) hours as needed for moderate pain or fever.     ALPRAZolam (XANAX) 1 MG tablet Take 0.5-1 tablets (0.5-1 mg total) by mouth 2  (two) times daily as needed for anxiety.     aspirin 81 MG tablet Take 81 mg by mouth daily.     cetirizine (ZYRTEC) 10 MG tablet Take 10 mg by mouth daily as needed for allergies.     FLUoxetine (PROZAC) 40 MG capsule Take 1 capsule by mouth once daily 90 capsule 0   lisinopril (ZESTRIL) 10 MG tablet Take 1 tablet (10 mg total) by mouth daily. 90 tablet 3   Multiple Vitamin (MULTIVITAMIN) capsule Take 1 capsule by mouth daily.     omeprazole (PRILOSEC) 20 MG capsule Take 1 capsule by mouth once daily 90 capsule 3   rosuvastatin (CRESTOR) 40 MG tablet Take 1 tablet (40 mg total) by mouth daily. 30 tablet 3   fluconazole (DIFLUCAN) 100 MG tablet Take 1 tablet (100 mg total) by mouth daily as needed (patient will take if she develops vaginal yeast infection). (Patient not taking: Reported on 09/02/2021) 10 tablet 0   nitroGLYCERIN (NITROSTAT) 0.4 MG SL tablet Place 1 tablet (0.4 mg total) under the tongue every 5 (five) minutes as needed for chest pain. (Patient not taking: Reported on 09/02/2021) 25 tablet 3   No current facility-administered medications for this visit.   Allergies  Allergen Reactions   Wound Dressing Adhesive Dermatitis and Rash   Review of Systems: All systems reviewed and negative except where noted in HPI.   Physical Exam: BP 140/80 (BP Location: Left Arm, Patient Position: Sitting, Cuff Size: Normal)   Pulse (!) 104   Ht 5\' 4"  (1.626 m)   Wt (!) 305 lb 6 oz (138.5 kg)   BMI 52.42 kg/m  Constitutional: Pleasant,well-developed, female in no acute distress. HEENT: Normocephalic and atraumatic. Conjunctivae are normal. No scleral icterus. Cardiovascular: Normal rate, regular rhythm.  Pulmonary/chest: Effort normal and breath sounds normal. No wheezing, rales or rhonchi. Abdominal: Soft, nondistended, nontender. Bowel sounds active throughout. There are no masses palpable. No hepatomegaly. Extremities: No edema Neurological: Alert and oriented to person place and  time. Skin: Skin is warm and dry. No rashes noted. Psychiatric: Normal mood and affect. Behavior is normal.  Labs 07/2021: CBC and CMP nml. HCV nml. HbA1C 5.5%. TSH nml. CRP elevated at 34.2.  RUQ U/S 09/17/17: IMPRESSION: Fatty infiltration of the liver. No other focal abnormality is noted.  CT A/P w/contrast 05/05/21: IMPRESSION: Significant interval improvement of right lower chest fluid collection with minimal fluid still remaining.  ASSESSMENT AND PLAN: Colon cancer screening Hemorrhoids GERD Morbid obesity Patient presents to discuss colon cancer screening.  She has had rectal bleeding issues in the past and has family history of colon cancer in second-degree relatives.  Patient is potentially interested in a colonoscopy for colon cancer screening, but has been told in the past that she had a portion of her colon stitched to the diaphragmatic hernia repair that she had done previously.  Thus I will plan to touch base with her surgeon Dr. Cliffton AstersLightfoot to see if he thinks it would be safe to proceed with a colonoscopy procedure in the setting of her prior surgeries.  In the meantime we will attempt to regulate her bowel movements to prevent exacerbations of her hemorrhoids. - Start 8 cups of water per day, walk 30 minutes per day, daily fiber supplement - Can trial stool softener and Sitz baths - Colonoscopy [facility] due to BMI greater than 50.  Will message Dr. Cliffton AstersLightfoot whether colonoscopy is safe to perform in the setting of her prior diaphragmatic hernia repair  Eulah Pontlaire Amilio Zehnder, MD

## 2021-09-02 NOTE — Patient Instructions (Addendum)
Increase water intake to 8 cups per day  Walk 30 minutes per day  Use a daily fiber supplement   You will be put on a wait-list for [facility] for Dr Leonides Schanz to complete a colonoscopy, You will be contacted by our office.  I will print off sample instructions for you to look over  If you are age 48 or older, your body mass index should be between 23-30. Your Body mass index is 52.42 kg/m. If this is out of the aforementioned range listed, please consider follow up with your Primary Care Provider.  If you are age 11 or younger, your body mass index should be between 19-25. Your Body mass index is 52.42 kg/m. If this is out of the aformentioned range listed, please consider follow up with your Primary Care Provider.   ________________________________________________________  The [facility] providers would like to encourage you to use [facility] to communicate with providers for non-urgent requests or questions.  Due to long hold times on the telephone, sending your provider a message by [facility] may be a faster and more efficient way to get a response.  Please allow 48 business hours for a response.  Please remember that this is for non-urgent requests.  _______________________________________________________   I appreciate the  opportunity to care for you  Thank You   Nicole Kindred Dorsey,MD

## 2021-09-03 ENCOUNTER — Telehealth: Payer: Self-pay | Admitting: Internal Medicine

## 2021-09-03 NOTE — Telephone Encounter (Signed)
-----  Message from [name], MD sent at 09/03/2021 10:38 AM EDT ----- Good Morning,  I'm not sure where she got that from.  There is no colon up there.  She should be safe for a colonoscopy.  Thanks,  [name]  ----- Message ----- From: Sharyn Creamer, MD Sent: 09/02/2021   2:47 PM EDT To: [name], MD  Hi Dr. Kipp Brood, I met our mutual patient Ms. Rhonda Colon today in clinic. She had a diaphragmatic hernia repair done with you. She said in clinic today that she had a portion of her colon tacked up to her diaphragm. I read our op notes but did not see this mentioned. She wanted me to check with you to see if it would be safe to perform a colonoscopy for her for colon cancer screening? Let me know your thoughts.  Thanks, Christia Reading, MD

## 2021-09-03 NOTE — Progress Notes (Signed)
46 y.o. G0P0000 Significant Other White or Caucasian Not Hispanic or Latino female here for annual exam.  She went 10 months without a cycle. July 9th was the period before she had a period in May. She is having a lot of hot flashes and night sweats. Doesn't want medication.   She is sexually active, she is back with her ex. They are not living together. No concerns for STD's. No dyspareunia. Some vaginal dryness, helped with lubrication.  No bowel or bladder changes. She has mild GSI, leaks most days, wears a panty liner.   She had a MI at 19, lost 100 lbs and didn't have a cycle for a year.  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 Was in the hospital for 21 days. Was on antibiotics for several months.     Patient's last menstrual period was 08/14/2021.        Sexually active: Yes.    The current method of family planning is none.    Exercising: Yes.     Walking  Smoker:  no  Health Maintenance: Pap:  08/09/20 WNL Hr HPV Neg, 08-02-17 negative  History of abnormal Pap:  no MMG:  01/21/21 Bi-rads 1 neg  BMD:   n/a Colonoscopy: has one scheduled.  TDaP:  08/08/13  Gardasil: n/a   reports that she quit smoking about 10 years ago. Her smoking use included cigarettes. She has a 18.00 pack-year smoking history. She has never used smokeless tobacco. She reports current alcohol use of about 4.0 standard drinks of alcohol per week. She reports that she does not use drugs. Realtor, has her own company.   Past Medical History:  Diagnosis Date   Anxiety    CAD (coronary artery disease)    a. 07/2011 NSTEMI/Cath: RCA 99%m, otw nl cors, EF 50%, inf hk -> RCA stented with 3.0x30mm Promus DES   Depression    Diaphragmatic hernia    GERD (gastroesophageal reflux disease)    Hyperlipidemia    Hypertension    Morgagni hernia    a. Discovered incidentally on CT 07/2011 ->f/u with Dr. Lindie Spruce   Myocardial infarction  [facility])    Obesity    Sepsis (HCC)    Sinus bradycardia    a. Nocturnal, asymptomatic   Sun allergy    Tobacco abuse    a. quit 07/2011    Past Surgical History:  Procedure Laterality Date   CARDIAC CATHETERIZATION  08/10/2011   left heart, with angiogram; DES mid RCA   DIAPHRAGMATIC HERNIA REPAIR  01/2021   with mesh   DIAPHRAGMATIC HERNIA REPAIR  06/02/2021   with mesh removal due to sepsis   INTERCOSTAL NERVE BLOCK Right 05/22/2021   Procedure: INTERCOSTAL NERVE BLOCK;  Surgeon: Corliss Skains, MD;  Location: [facility] OR;  Service: Thoracic;  Laterality: Right;   IR RADIOLOGIST EVAL & MGMT  05/05/2021   LEFT HEART CATHETERIZATION WITH CORONARY ANGIOGRAM N/A 08/10/2011   Procedure: LEFT HEART CATHETERIZATION WITH CORONARY ANGIOGRAM;  Surgeon: Kathleene Hazel, MD;  Location: [facility] CATH LAB;  Service: Cardiovascular;  Laterality: N/A;   TONSILLECTOMY      Current Outpatient Medications  Medication Sig Dispense Refill   acetaminophen (TYLENOL) 500 MG tablet Take 500 mg by mouth every 6 (six) hours as needed for moderate pain or fever.     ALPRAZolam (XANAX) 1 MG tablet Take 0.5-1 tablets (0.5-1 mg total) by mouth 2 (two)  times daily as needed for anxiety.     aspirin 81 MG tablet Take 81 mg by mouth daily.     cetirizine (ZYRTEC) 10 MG tablet Take 10 mg by mouth daily as needed for allergies.     fluconazole (DIFLUCAN) 100 MG tablet Take 1 tablet (100 mg total) by mouth daily as needed (patient will take if she develops vaginal yeast infection). 10 tablet 0   FLUoxetine (PROZAC) 40 MG capsule Take 1 capsule by mouth once daily 90 capsule 0   lisinopril (ZESTRIL) 10 MG tablet Take 1 tablet (10 mg total) by mouth daily. 90 tablet 3   Multiple Vitamin (MULTIVITAMIN) capsule Take 1 capsule by mouth daily.     nitroGLYCERIN (NITROSTAT) 0.4 MG SL tablet Place 1 tablet (0.4 mg total) under the tongue every 5 (five) minutes as needed for chest pain. 25 tablet 3   omeprazole (PRILOSEC) 20  MG capsule Take 1 capsule by mouth once daily 90 capsule 3   rosuvastatin (CRESTOR) 40 MG tablet Take 1 tablet (40 mg total) by mouth daily. 30 tablet 3   No current facility-administered medications for this visit.    Family History  Problem Relation Age of Onset   Coronary artery disease Mother [id number]        stent   Heart attack Mother    Heart disease Mother    Hyperlipidemia Mother    Obesity Mother    Colon polyps Mother    Arrhythmia Father    Depression Father    Breast cancer Maternal Grandmother    Coronary artery disease Maternal Aunt [id number]       (Mother's twin sister)   Lung cancer Maternal Aunt    Heart attack Maternal Aunt        mother's twin   Colon polyps Maternal Aunt    Colon cancer Maternal Uncle    Coronary artery disease Maternal Uncle 46   Heart attack Maternal Uncle    Breast cancer Cousin 38   Cancer Neg Hx    Early death Neg Hx    Hypertension Neg Hx    Kidney disease Neg Hx    Stroke Neg Hx    Alcohol abuse Neg Hx    Diabetes Neg Hx    Drug abuse Neg Hx     Review of Systems  All other systems reviewed and are negative.   Exam:   BP 138/74   Pulse (!) 104   Ht 5' 4.5" (1.638 m)   Wt 298 lb (135.2 kg)   LMP 08/14/2021   SpO2 97%   BMI 50.36 kg/m   Weight change: @WEIGHTCHANGE @ Height:   Height: 5' 4.5" (163.8 cm)  Ht Readings from Last 3 Encounters:  09/10/21 5' 4.5" (1.638 m)  09/02/21 5\' 4"  (1.626 m)  09/01/21 5\' 4"  (1.626 m)    General appearance: alert, cooperative and appears stated age Head: Normocephalic, without obvious abnormality, atraumatic Neck: no adenopathy, supple, symmetrical, trachea midline and thyroid normal to inspection and palpation Lungs: clear to auscultation bilaterally Cardiovascular: regular rate and rhythm Breasts: normal appearance, no masses or tenderness Abdomen: soft, non-tender; non distended,  no masses,  no organomegaly Extremities: extremities normal, atraumatic, no cyanosis or edema Skin: Skin  color, texture, turgor normal. No rashes or lesions Lymph nodes: Cervical, supraclavicular, and axillary nodes normal. No abnormal inguinal nodes palpated Neurologic: Grossly normal   Pelvic: External genitalia:  no lesions              Urethra:  normal appearing urethra with no masses, tenderness or lesions              Bartholins and Skenes: normal                 Vagina: normal appearing vagina with normal color and discharge, no lesions              Cervix: no lesions               Bimanual Exam:  Uterus:   no masses or tenderness              Adnexa: no mass, fullness, tenderness               Rectovaginal: Confirms               Anus:  normal sphincter tone, no lesions  Carolynn Serve chaperoned for the exam.  1. Well woman exam Discussed breast self exam Discussed calcium and vit D intake Mammogram in 10/23 Colonoscopy Scheduled Labs with primary and cardiology  2. Abnormal uterine bleeding Needs evaluation Previously unable to dilate her cervix for attempted mirena IUD insertion - US PELVIS TRANSVAGINAL NON-OB (TV ONLY); Future Will have her return for an ultrasound and possible ultrasound guided endometrial biopsy. Will pretreat with cytotec  3. Cervical stenosis (uterine cervix) - misoprostol (CYTOTEC) 200 MCG tablet; Place 2 tablets vaginally 6-12 hours prior to procedure  Dispense: 2 tablet; Refill: 0  4. Perimenopausal vasomotor symptoms Not a candidate for HRT. She is on Prozac. Discussed the option of gabapentin.  Currently symptoms are tolerable  5. GSI (genuine stress incontinence), female Tolerable Kegel information given

## 2021-09-04 ENCOUNTER — Telehealth: Payer: Self-pay

## 2021-09-04 NOTE — Telephone Encounter (Signed)
Left message for the patient to return my call about the scheduled procedure.  (scheduled for 09/24/21 at [facility] arrive at 6:00 am for a 7:30 am)

## 2021-09-05 ENCOUNTER — Other Ambulatory Visit: Payer: Self-pay

## 2021-09-05 ENCOUNTER — Ambulatory Visit: Payer: Self-pay

## 2021-09-05 DIAGNOSIS — Z1211 Encounter for screening for malignant neoplasm of colon: Secondary | ICD-10-CM

## 2021-09-05 NOTE — Telephone Encounter (Signed)
My Chart message sent to the patient.

## 2021-09-10 ENCOUNTER — Encounter: Payer: Self-pay | Admitting: Obstetrics and Gynecology

## 2021-09-10 ENCOUNTER — Ambulatory Visit (INDEPENDENT_AMBULATORY_CARE_PROVIDER_SITE_OTHER): Payer: 59 | Admitting: Obstetrics and Gynecology

## 2021-09-10 VITALS — BP 138/74 | HR 104 | Ht 64.5 in | Wt 298.0 lb

## 2021-09-10 DIAGNOSIS — N939 Abnormal uterine and vaginal bleeding, unspecified: Secondary | ICD-10-CM | POA: Diagnosis not present

## 2021-09-10 DIAGNOSIS — N393 Stress incontinence (female) (male): Secondary | ICD-10-CM | POA: Diagnosis not present

## 2021-09-10 DIAGNOSIS — N951 Menopausal and female climacteric states: Secondary | ICD-10-CM

## 2021-09-10 DIAGNOSIS — Z01419 Encounter for gynecological examination (general) (routine) without abnormal findings: Secondary | ICD-10-CM

## 2021-09-10 DIAGNOSIS — N882 Stricture and stenosis of cervix uteri: Secondary | ICD-10-CM | POA: Diagnosis not present

## 2021-09-10 MED ORDER — MISOPROSTOL 200 MCG PO TABS: ORAL_TABLET | ORAL | 0 refills | Status: DC

## 2021-09-10 NOTE — Patient Instructions (Signed)
EXERCISE   We recommended that you start or continue a regular exercise program for good health. Physical activity is anything that gets your body moving, some is better than none. The CDC recommends 150 minutes per week of Moderate-Intensity Aerobic Activity and 2 or more days of Muscle Strengthening Activity.  Benefits of exercise are limitless: helps weight loss/weight maintenance, improves mood and energy, helps with depression and anxiety, improves sleep, tones and strengthens muscles, improves balance, improves bone density, protects from chronic conditions such as heart disease, high blood pressure and diabetes and so much more. To learn more visit: https://www.cdc.gov/physicalactivity/index.html  DIET: Good nutrition starts with a healthy diet of fruits, vegetables, whole grains, and lean protein sources. Drink plenty of water for hydration. Minimize empty calories, sodium, sweets. For more information about dietary recommendations visit: https://health.gov/our-work/nutrition-physical-activity/dietary-guidelines and https://www.myplate.gov/  ALCOHOL:  Women should limit their alcohol intake to no more than 7 drinks/beers/glasses of wine (combined, not each!) per week. Moderation of alcohol intake to this level decreases your risk of breast cancer and liver damage.  If you are concerned that you may have a problem, or your friends have told you they are concerned about your drinking, there are many resources to help. A well-known program that is free, effective, and available to all people all over the nation is Alcoholics Anonymous.  Check out this site to learn more: https://www.aa.org/   CALCIUM AND VITAMIN D:  Adequate intake of calcium and Vitamin D are recommended for bone health.  You should be getting between 1000-1200 mg of calcium and 800 units of Vitamin D daily between diet and supplements  PAP SMEARS:  Pap smears, to check for cervical cancer or precancers,  have traditionally been  done yearly, scientific advances have shown that most women can have pap smears less often.  However, every woman still should have a physical exam from her gynecologist every year. It will include a breast check, inspection of the vulva and vagina to check for abnormal growths or skin changes, a visual exam of the cervix, and then an exam to evaluate the size and shape of the uterus and ovaries. We will also provide age appropriate advice regarding health maintenance, like when you should have certain vaccines, screening for sexually transmitted diseases, bone density testing, colonoscopy, mammograms, etc.   MAMMOGRAMS:  All women over 40 years old should have a routine mammogram.   COLON CANCER SCREENING: Now recommend starting at age 45. At this time colonoscopy is not covered for routine screening until 50. There are take home tests that can be done between 45-49.   COLONOSCOPY:  Colonoscopy to screen for colon cancer is recommended for all women at age 50.  We know, you hate the idea of the prep.  We agree, BUT, having colon cancer and not knowing it is worse!!  Colon cancer so often starts as a polyp that can be seen and removed at colonscopy, which can quite literally save your life!  And if your first colonoscopy is normal and you have no family history of colon cancer, most women don't have to have it again for 10 years.  Once every ten years, you can do something that may end up saving your life, right?  We will be happy to help you get it scheduled when you are ready.  Be sure to check your insurance coverage so you understand how much it will cost.  It may be covered as a preventative service at no cost, but you should check   your particular policy.      Breast Self-Awareness Breast self-awareness means being familiar with how your breasts look and feel. It involves checking your breasts regularly and reporting any changes to your health care provider. Practicing breast self-awareness is  important. A change in your breasts can be a sign of a serious medical problem. Being familiar with how your breasts look and feel allows you to find any problems early, when treatment is more likely to be successful. All women should practice breast self-awareness, including women who have had breast implants. How to do a breast self-exam One way to learn what is normal for your breasts and whether your breasts are changing is to do a breast self-exam. To do a breast self-exam: Look for Changes  Remove all the clothing above your waist. Stand in front of a mirror in a room with good lighting. Put your hands on your hips. Push your hands firmly downward. Compare your breasts in the mirror. Look for differences between them (asymmetry), such as: Differences in shape. Differences in size. Puckers, dips, and bumps in one breast and not the other. Look at each breast for changes in your skin, such as: Redness. Scaly areas. Look for changes in your nipples, such as: Discharge. Bleeding. Dimpling. Redness. A change in position. Feel for Changes Carefully feel your breasts for lumps and changes. It is best to do this while lying on your back on the floor and again while sitting or standing in the shower or tub with soapy water on your skin. Feel each breast in the following way: Place the arm on the side of the breast you are examining above your head. Feel your breast with the other hand. Start in the nipple area and make  inch (2 cm) overlapping circles to feel your breast. Use the pads of your three middle fingers to do this. Apply light pressure, then medium pressure, then firm pressure. The light pressure will allow you to feel the tissue closest to the skin. The medium pressure will allow you to feel the tissue that is a little deeper. The firm pressure will allow you to feel the tissue close to the ribs. Continue the overlapping circles, moving downward over the breast until you feel your  ribs below your breast. Move one finger-width toward the center of the body. Continue to use the  inch (2 cm) overlapping circles to feel your breast as you move slowly up toward your collarbone. Continue the up and down exam using all three pressures until you reach your armpit.  Write Down What You Find  Write down what is normal for each breast and any changes that you find. Keep a written record with breast changes or normal findings for each breast. By writing this information down, you do not need to depend only on memory for size, tenderness, or location. Write down where you are in your menstrual cycle, if you are still menstruating. If you are having trouble noticing differences in your breasts, do not get discouraged. With time you will become more familiar with the variations in your breasts and more comfortable with the exam. How often should I examine my breasts? Examine your breasts every month. If you are breastfeeding, the best time to examine your breasts is after a feeding or after using a breast pump. If you menstruate, the best time to examine your breasts is 5-7 days after your period is over. During your period, your breasts are lumpier, and it may be more   difficult to notice changes. When should I see my health care provider? See your health care provider if you notice: A change in shape or size of your breasts or nipples. A change in the skin of your breast or nipples, such as a reddened or scaly area. Unusual discharge from your nipples. A lump or thick area that was not there before. Pain in your breasts. Anything that concerns you. Kegel Exercises  Kegel exercises can help strengthen your pelvic floor muscles. The pelvic floor is a group of muscles that support your rectum, small intestine, and bladder. In females, pelvic floor muscles also help support the uterus. These muscles help you control the flow of urine and stool (feces). Kegel exercises are painless and  simple. They do not require any equipment. Your provider may suggest Kegel exercises to: Improve bladder and bowel control. Improve sexual response. Improve weak pelvic floor muscles after surgery to remove the uterus (hysterectomy) or after pregnancy, in females. Improve weak pelvic floor muscles after prostate gland removal or surgery, in males. Kegel exercises involve squeezing your pelvic floor muscles. These are the same muscles you squeeze when you try to stop the flow of urine or keep from passing gas. The exercises can be done while sitting, standing, or lying down, but it is best to vary your position. Ask your health care provider which exercises are safe for you. Do exercises exactly as told by your health care provider and adjust them as directed. Do not begin these exercises until told by your health care provider. Exercises How to do Kegel exercises: Squeeze your pelvic floor muscles tight. You should feel a tight lift in your rectal area. If you are a female, you should also feel a tightness in your vaginal area. Keep your stomach, buttocks, and legs relaxed. Hold the muscles tight for up to 10 seconds. Breathe normally. Relax your muscles for up to 10 seconds. Repeat as told by your health care provider. Repeat this exercise daily as told by your health care provider. Continue to do this exercise for at least 4-6 weeks, or for as long as told by your health care provider. You may be referred to a physical therapist who can help you learn more about how to do Kegel exercises. Depending on your condition, your health care provider may recommend: Varying how long you squeeze your muscles. Doing several sets of exercises every day. Doing exercises for several weeks. Making Kegel exercises a part of your regular exercise routine. This information is not intended to replace advice given to you by your health care provider. Make sure you discuss any questions you have with your health  care provider. Document Revised: 07/25/2020 Document Reviewed: 07/25/2020 Elsevier Patient Education  2023 Elsevier Inc.  

## 2021-09-11 ENCOUNTER — Other Ambulatory Visit: Payer: Self-pay

## 2021-09-19 ENCOUNTER — Telehealth: Payer: Self-pay | Admitting: Internal Medicine

## 2021-09-19 NOTE — Telephone Encounter (Signed)
Patient called and due to a schedule change, she will be unable to have her procedure scheduled at [facility] on Wednesday 6/28.  Please cancel procedure.  Patient did not say anything about rescheduling.  Thank you.

## 2021-09-20 ENCOUNTER — Encounter: Payer: Self-pay | Admitting: Internal Medicine

## 2021-09-22 ENCOUNTER — Encounter: Payer: Self-pay | Admitting: Obstetrics and Gynecology

## 2021-09-22 DIAGNOSIS — N951 Menopausal and female climacteric states: Secondary | ICD-10-CM

## 2021-09-23 MED ORDER — GABAPENTIN 100 MG PO CAPS: ORAL_CAPSULE | ORAL | 0 refills | Status: DC

## 2021-09-24 ENCOUNTER — Ambulatory Visit (HOSPITAL_COMMUNITY): Admission: RE | Admit: 2021-09-24 | Payer: 59 | Source: Home / Self Care | Admitting: Internal Medicine

## 2021-09-24 SURGERY — COLONOSCOPY WITH PROPOFOL
Anesthesia: Monitor Anesthesia Care

## 2021-10-07 ENCOUNTER — Encounter: Payer: Self-pay | Admitting: Cardiology

## 2021-10-07 ENCOUNTER — Telehealth: Payer: Self-pay

## 2021-10-07 ENCOUNTER — Other Ambulatory Visit: Payer: Self-pay | Admitting: Thoracic Surgery (Cardiothoracic Vascular Surgery)

## 2021-10-07 DIAGNOSIS — J90 Pleural effusion, not elsewhere classified: Secondary | ICD-10-CM

## 2021-10-07 NOTE — Telephone Encounter (Signed)
My Chart message returned unread:  "Hi Rhonda Colon, I have sent in a script for gabapentin for you. I typically start this medication at night and see how you tolerate it. The biggest side effect is sedation, which is why it's particularly helpful for night sweats and sleep.  The directions are with the prescription, but you start out with one tablet a night and can build up to 3 tablets at night as tolerated. Please make an appointment in one month for follow up, we can make further adjustments at that time. Best, Gertie Exon"   I called patient and left message to call me or login and read and reply so I know she received the message.

## 2021-10-15 NOTE — Telephone Encounter (Signed)
Patient ready My chart message.  "Last read by Annia Friendly at  8:29 AM on 10/08/2021".

## 2021-10-17 ENCOUNTER — Ambulatory Visit
Admission: RE | Admit: 2021-10-17 | Discharge: 2021-10-17 | Disposition: A | Discharge status: Home / Self Care | Payer: 59 | Source: Ambulatory Visit | Attending: Thoracic Surgery (Cardiothoracic Vascular Surgery) | Admitting: Thoracic Surgery (Cardiothoracic Vascular Surgery)

## 2021-10-17 ENCOUNTER — Ambulatory Visit: Payer: 59 | Admitting: Thoracic Surgery (Cardiothoracic Vascular Surgery)

## 2021-10-17 VITALS — BP 134/88 | HR 92 | Resp 20 | Ht 64.5 in | Wt 303.0 lb

## 2021-10-17 DIAGNOSIS — J9859 Other diseases of mediastinum, not elsewhere classified: Secondary | ICD-10-CM | POA: Diagnosis not present

## 2021-10-17 DIAGNOSIS — Q790 Congenital diaphragmatic hernia: Secondary | ICD-10-CM

## 2021-10-17 DIAGNOSIS — Z09 Encounter for follow-up examination after completed treatment for conditions other than malignant neoplasm: Secondary | ICD-10-CM | POA: Diagnosis not present

## 2021-10-17 DIAGNOSIS — J90 Pleural effusion, not elsewhere classified: Secondary | ICD-10-CM

## 2021-10-17 NOTE — Progress Notes (Signed)
      [address]       [place] [address]             [phone]        CAILIN GEBEL [facility] Medical Record #[id number] Date of Birth: 1975/12/10  Referring: Rollene Rotunda, MD Primary Care: Margaree Mackintosh, MD Primary Cardiologist:James Hochrein, MD  Reason for visit:   follow-up  History of Present Illness:     46 year old female comes in for 1 month follow-up appointment after undergoing a redo diaphragmatic hernia repair with removal of infected mesh.  Overall she is doing well.  She denies any shortness of breath or significant pain  Physical Exam: BP 134/88   Pulse 92   Resp 20 Comment: RA  Ht 5' 4.5" (1.638 m)   Wt (!) 303 lb (137.4 kg)   LMP 08/14/2021 Comment: previous 10/02/20  SpO2 96%   BMI 51.21 kg/m   Alert NAD Abdomen, ND No peripheral edema   Diagnostic Studies & Laboratory data: CXR: Stable     Assessment / Plan:   46 year old female status post redo diaphragmatic hernia repair with removal of infected mesh.  Overall she is doing well.  She will follow-up as needed.   Corliss Skains 10/17/2021 1:21 PM

## 2021-10-23 ENCOUNTER — Other Ambulatory Visit: Payer: Self-pay | Admitting: Obstetrics and Gynecology

## 2021-10-23 ENCOUNTER — Ambulatory Visit (INDEPENDENT_AMBULATORY_CARE_PROVIDER_SITE_OTHER): Payer: 59 | Admitting: Obstetrics and Gynecology

## 2021-10-23 ENCOUNTER — Telehealth: Payer: Self-pay | Admitting: Obstetrics and Gynecology

## 2021-10-23 ENCOUNTER — Other Ambulatory Visit (HOSPITAL_COMMUNITY)
Admission: RE | Admit: 2021-10-23 | Discharge: 2021-10-23 | Disposition: A | Discharge status: Home / Self Care | Payer: 59 | Source: Ambulatory Visit | Attending: Obstetrics and Gynecology | Admitting: Obstetrics and Gynecology

## 2021-10-23 ENCOUNTER — Encounter: Payer: Self-pay | Admitting: Obstetrics and Gynecology

## 2021-10-23 ENCOUNTER — Ambulatory Visit (INDEPENDENT_AMBULATORY_CARE_PROVIDER_SITE_OTHER): Payer: 59

## 2021-10-23 VITALS — BP 122/70 | HR 96 | Resp 20

## 2021-10-23 DIAGNOSIS — Z3009 Encounter for other general counseling and advice on contraception: Secondary | ICD-10-CM | POA: Diagnosis not present

## 2021-10-23 DIAGNOSIS — Z8679 Personal history of other diseases of the circulatory system: Secondary | ICD-10-CM

## 2021-10-23 DIAGNOSIS — N939 Abnormal uterine and vaginal bleeding, unspecified: Secondary | ICD-10-CM

## 2021-10-23 DIAGNOSIS — N882 Stricture and stenosis of cervix uteri: Secondary | ICD-10-CM

## 2021-10-23 LAB — PREGNANCY, URINE: Preg Test, Ur: NEGATIVE

## 2021-10-23 MED ORDER — MISOPROSTOL 200 MCG PO TABS: ORAL_TABLET | ORAL | 0 refills | Status: DC

## 2021-10-23 NOTE — Progress Notes (Signed)
GYNECOLOGY  VISIT   HPI: 46 y.o.   Significant Other White or Caucasian Not Hispanic or Latino  female   [id number] with Patient's last menstrual period was 08/14/2021.   here for evaluation of AUB.   GYNECOLOGIC HISTORY: Patient's last menstrual period was 08/14/2021. Contraception:None Menopausal hormone therapy: None        OB History     Gravida  0   Para  0   Term  0   Preterm  0   AB  0   Living  0      SAB  0   IAB  0   Ectopic  0   Multiple  0   Live Births  0              Patient Active Problem List   Diagnosis Date Noted   Antibiotic-associated diarrhea 07/02/2021   Empyema lung (HCC) 07/01/2021   Medication monitoring encounter 07/01/2021   Infected prosthetic mesh of abdominal wall (HCC) 07/01/2021   Pleural effusion 05/17/2021   Sepsis due to intrathoracic abscess (HCC) 05/16/2021   Diaphragmatic hernia 02/24/2021   Vulvovaginitis 01/31/2021   Dyslipidemia 06/16/2020   Other fatigue 08/31/2017   Shortness of breath on exertion 08/31/2017   Essential hypertension 08/31/2017   Depression with anxiety 12/10/2015   Urticaria, solar 08/08/2013   CAD (coronary artery disease) 08/28/2011   Chronic low back pain 07/27/2011    Past Medical History:  Diagnosis Date   Anxiety    CAD (coronary artery disease)    a. 07/2011 NSTEMI/Cath: RCA 99%m, otw nl cors, EF 50%, inf hk -> RCA stented with 3.0x47mm Promus DES   Depression    Diaphragmatic hernia    GERD (gastroesophageal reflux disease)    Hyperlipidemia    Hypertension    Morgagni hernia    a. Discovered incidentally on CT 07/2011 ->f/u with Dr. Lindie Spruce   Myocardial infarction [facility])    Obesity    Sepsis (HCC)    Sinus bradycardia    a. Nocturnal, asymptomatic   Sun allergy    Tobacco abuse    a. quit 07/2011    Past Surgical History:  Procedure Laterality Date   CARDIAC CATHETERIZATION  08/10/2011   left heart, with angiogram; DES mid RCA   DIAPHRAGMATIC HERNIA REPAIR  01/2021    with mesh   DIAPHRAGMATIC HERNIA REPAIR  06/02/2021   with mesh removal due to sepsis   INTERCOSTAL NERVE BLOCK Right 05/22/2021   Procedure: INTERCOSTAL NERVE BLOCK;  Surgeon: Corliss Skains, MD;  [facility];  Service: Thoracic;  Laterality: Right;   IR RADIOLOGIST EVAL & MGMT  05/05/2021   LEFT HEART CATHETERIZATION WITH CORONARY ANGIOGRAM N/A 08/10/2011   Procedure: LEFT HEART CATHETERIZATION WITH CORONARY ANGIOGRAM;  Surgeon: Kathleene Hazel, MD;  Location: [facility] CATH LAB;  Service: Cardiovascular;  Laterality: N/A;   TONSILLECTOMY      Current Outpatient Medications  Medication Sig Dispense Refill   acetaminophen (TYLENOL) 500 MG tablet Take 500 mg by mouth every 6 (six) hours as needed for moderate pain or fever.     ALPRAZolam (XANAX) 1 MG tablet Take 0.5-1 tablets (0.5-1 mg total) by mouth 2 (two) times daily as needed for anxiety.     aspirin 81 MG tablet Take 81 mg by mouth daily.     cetirizine (ZYRTEC) 10 MG tablet Take 10 mg by mouth daily as needed for allergies.     fluconazole (DIFLUCAN) 100 MG tablet Take 1 tablet (100  mg total) by mouth daily as needed (patient will take if she develops vaginal yeast infection). 10 tablet 0   FLUoxetine (PROZAC) 40 MG capsule Take 1 capsule by mouth once daily 90 capsule 0   gabapentin (NEURONTIN) 100 MG capsule Take one tablet po qhs, if tolerating can increase to 2 tablets po qhs after 3 days, if tolerating can increase to 3 tablets po qhs after another 3 days. 90 capsule 0   lisinopril (ZESTRIL) 10 MG tablet Take 1 tablet (10 mg total) by mouth daily. 90 tablet 3   misoprostol (CYTOTEC) 200 MCG tablet Place 2 tablets vaginally 6-12 hours prior to procedure 2 tablet 0   Multiple Vitamin (MULTIVITAMIN) capsule Take 1 capsule by mouth daily.     nitroGLYCERIN (NITROSTAT) 0.4 MG SL tablet Place 1 tablet (0.4 mg total) under the tongue every 5 (five) minutes as needed for chest pain. 25 tablet 3   omeprazole (PRILOSEC) 20 MG  capsule Take 1 capsule by mouth once daily 90 capsule 3   rosuvastatin (CRESTOR) 40 MG tablet Take 1 tablet (40 mg total) by mouth daily. (Patient taking differently: Take 40 mg by mouth every evening.) 30 tablet 3   No current facility-administered medications for this visit.     ALLERGIES: Wound dressing adhesive  Family History  Problem Relation Age of Onset   Coronary artery disease Mother 28        stent   Heart attack Mother    Heart disease Mother    Hyperlipidemia Mother    Obesity Mother    Colon polyps Mother    Arrhythmia Father    Depression Father    Breast cancer Maternal Grandmother    Coronary artery disease Maternal Aunt 85       (Mother's twin sister)   Lung cancer Maternal Aunt    Heart attack Maternal Aunt        mother's twin   Colon polyps Maternal Aunt    Colon cancer Maternal Uncle    Coronary artery disease Maternal Uncle 46   Heart attack Maternal Uncle    Breast cancer Cousin 38   Cancer Neg Hx    Early death Neg Hx    Hypertension Neg Hx    Kidney disease Neg Hx    Stroke Neg Hx    Alcohol abuse Neg Hx    Diabetes Neg Hx    Drug abuse Neg Hx     Social History   Socioeconomic History   Marital status: Significant Other    Spouse name: Not on file   Number of children: 0   Years of education: Not on file   Highest education level: Not on file  Occupational History   Occupation: Realtor  Tobacco Use   Smoking status: Former    Packs/day: 1.00    Years: 18.00    Total pack years: 18.00    Types: Cigarettes    Quit date: 08/09/2011    Years since quitting: 10.2   Smokeless tobacco: Never   Tobacco comments:    2013  Vaping Use   Vaping Use: Never used  Substance and Sexual Activity   Alcohol use: Yes    Alcohol/week: 4.0 standard drinks of alcohol    Types: 4 Glasses of wine per week    Comment: occasionally   Drug use: No   Sexual activity: Yes    Birth control/protection: None, Pill  Other Topics Concern   Not on file   Social History Narrative  Lives alone.  Occasional EtOH.  Works as a Veterinary surgeon.   Social Determinants of Health   Financial Resource Strain: Not on file  Food Insecurity: Not on file  Transportation Needs: Not on file  Physical Activity: Not on file  Stress: Not on file  Social Connections: Not on file  Intimate Partner Violence: Not on file    Review of Systems  All other systems reviewed and are negative.   PHYSICAL EXAMINATION:    LMP 08/14/2021 Comment: previous 10/02/20    General appearance: alert, cooperative and appears stated age Neck: no adenopathy, supple, symmetrical, trachea midline and thyroid normal to inspection and palpation Heart: regular rate and rhythm Lungs: CTAB Abdomen: soft, non-tender; bowel sounds normal; no masses,  no organomegaly Extremities: normal, atraumatic, no cyanosis Skin: normal color, texture and turgor, no rashes or lesions Lymph: normal cervical supraclavicular and inguinal nodes Neurologic: grossly normal   Pelvic: External genitalia:  no lesions              Urethra:  normal appearing urethra with no masses, tenderness or lesions              Bartholins and Skenes: normal                 Vagina: normal appearing vagina with normal color and discharge, no lesions              Cervix: no lesions                Sonohysterogram The procedure and risks of the procedure were reviewed with the patient, consent form was signed. A speculum was placed in the vagina and the cervix was cleansed with betadine. The sonohysterogram catheter was inserted into the uterine cavity without difficulty. Saline was infused under direct observation with the ultrasound. The cavity didn't fill well, but there appeared to be an endometrial polyp (more obvious on review of the images after the procedure) The risks of endometrial biopsy were reviewed and a consent was obtained.  The uterine evacuator catheter was in the endometrial cavity. The uterus sounded to  ~7 cm. The endometrial biopsy was performed, taking care to get a representative sample, sampling 360 degrees of the uterine cavity. Minimal tissue was obtained. The tenaculum and speculum were removed. There were no complications.    Chaperone was present for exam.  1. Abnormal uterine bleeding Patient is at least perimenopausal Plan: hysteroscopy, dilation and curettage. Reviewed risks, including: bleeding, infection, uterine perforation, fluid overload, need for further surgery Plan mirena IUD insertion  2. General counselling and advice on contraception Perimenopausal, having unprotected intercourse Will place IUD  3. Cervical stenosis (uterine cervix) Will plan above surgery with u/s guidance - misoprostol (CYTOTEC) 200 MCG tablet; Place 2 tablets vaginally the night prior to surgery  Dispense: 2 tablet; Refill: 0  4. History of CAD (coronary artery disease) Needs preop clearance.  In addition to the u/s and endometrial biopsy, time was spent in management and counseling

## 2021-10-23 NOTE — Telephone Encounter (Signed)
Surgery: CPT [phone] - Hysteroscopy/D&C/Myosure,  mirena iud insertion . U/S guidance  Diagnosis: N93.9 Abnormal Uterine Bleeding,  contraception  Location: [facility]  Status: Outpatient  Time: 30 Minutes  Assistant: N/A  Urgency: At Patient's Convenience  Pre-Op Appointment: Completed  Post-Op Appointment(s): 1 Week,   Time Out Of Work: Day Of Surgery, 1 Day Post Op  Needs to be with u/s guidance secondary to cervical stenosis. Please let her know I called in a script for cytotec (she used it for the u/s today).  She needs cardiac clearance.

## 2021-10-27 ENCOUNTER — Telehealth: Payer: Self-pay | Admitting: *Deleted

## 2021-10-27 ENCOUNTER — Telehealth: Payer: Self-pay | Admitting: Cardiology

## 2021-10-27 LAB — SURGICAL PATHOLOGY

## 2021-10-27 NOTE — Telephone Encounter (Signed)
Pt agreeable to plan of care for tele pre op appt 11/03/21 @ 9:40. Med rec and consent are done.     Patient Consent for Virtual Visit        Rhonda Colon has provided verbal consent on 10/27/2021 for a virtual visit (video or telephone).   CONSENT FOR VIRTUAL VISIT FOR:  Rhonda Colon  By participating in this virtual visit I agree to the following:  I hereby voluntarily request, consent and authorize [facility] and its employed or contracted physicians, physician assistants, nurse practitioners or other licensed health care professionals (the Practitioner), to provide me with telemedicine health care services (the "Services") as deemed necessary by the treating Practitioner. I acknowledge and consent to receive the Services by the Practitioner via telemedicine. I understand that the telemedicine visit will involve communicating with the Practitioner through live audiovisual communication technology and the disclosure of certain medical information by electronic transmission. I acknowledge that I have been given the opportunity to request an in-person assessment or other available alternative prior to the telemedicine visit and am voluntarily participating in the telemedicine visit.  I understand that I have the right to withhold or withdraw my consent to the use of telemedicine in the course of my care at any time, without affecting my right to future care or treatment, and that the Practitioner or I may terminate the telemedicine visit at any time. I understand that I have the right to inspect all information obtained and/or recorded in the course of the telemedicine visit and may receive copies of available information for a reasonable fee.  I understand that some of the potential risks of receiving the Services via telemedicine include:  Delay or interruption in medical evaluation due to technological equipment failure or disruption; Information transmitted may not be sufficient (e.g.  poor resolution of images) to allow for appropriate medical decision making by the Practitioner; and/or  In rare instances, security protocols could fail, causing a breach of personal health information.  Furthermore, I acknowledge that it is my responsibility to provide information about my medical history, conditions and care that is complete and accurate to the best of my ability. I acknowledge that Practitioner's advice, recommendations, and/or decision may be based on factors not within their control, such as incomplete or inaccurate data provided by me or distortions of diagnostic images or specimens that may result from electronic transmissions. I understand that the practice of medicine is not an exact science and that Practitioner makes no warranties or guarantees regarding treatment outcomes. I acknowledge that a copy of this consent can be made available to me via my patient portal [facility] MyChart), or I can request a printed copy by calling the office of [facility].    I understand that my insurance will be billed for this visit.   I have read or had this consent read to me. I understand the contents of this consent, which adequately explains the benefits and risks of the Services being provided via telemedicine.  I have been provided ample opportunity to ask questions regarding this consent and the Services and have had my questions answered to my satisfaction. I give my informed consent for the services to be provided through the use of telemedicine in my medical care

## 2021-10-27 NOTE — Telephone Encounter (Signed)
Left message to call Noreene Larsson, RN at [facility], [phone].   Call placed to [facility]/ Dr. Antoine Poche -surgical clearance requested.

## 2021-10-27 NOTE — Telephone Encounter (Signed)
   Preoperative team, please contact this patient and set up a phone call appointment for further cardiac evaluation.  Thank you for your help.  Thomasene Ripple. Shanautica Forker NP-C    10/27/2021, 10:10 AM [facility] [address] Office [phone] Fax [phone]

## 2021-10-27 NOTE — Telephone Encounter (Signed)
   Pre-operative Risk Assessment    Patient Name: Rhonda Colon  DOB: [birth year] MRN: [id number]      Request for Surgical Clearance    Procedure:   hysteroscopy D&C myosure  insertion ultrasound guided   Date of Surgery:  Clearance TBD                                 Surgeon:  Dr Luellen Pucker Group or Practice Name:  [facility] Phone number:  [phone] Fax number:  [phone]   Type of Clearance Requested:   - Medical    Type of Anesthesia:  General    Additional requests/questions:      SignedFilomena Jungling   10/27/2021, 9:59 AM

## 2021-10-27 NOTE — Telephone Encounter (Signed)
Pt agreeable to plan of care for tele pre op appt 11/03/21 @ 9:40. Med rec and consent are done.

## 2021-10-27 NOTE — Telephone Encounter (Signed)
Patient returned call. Patient request call after 2pm today to discuss surgery scheduling.

## 2021-10-28 ENCOUNTER — Encounter: Payer: Self-pay | Admitting: Obstetrics and Gynecology

## 2021-10-28 ENCOUNTER — Telehealth: Payer: Self-pay | Admitting: *Deleted

## 2021-10-28 NOTE — Telephone Encounter (Signed)
Spoke with patient. Reviewed surgery dates. Patient request to proceed with surgery on 11/24/21.  Advised patient I will forward to business office for return call. I will return call once surgery date and time confirmed. Patient verbalizes understanding and is agreeable. Patient has cardiology appt 11/03/21.   Surgery request sent.   Routing to [facility]

## 2021-10-28 NOTE — Telephone Encounter (Signed)
PA done via cover my meds for cytotec 200 mcg. Medication approved as of 10/28/21-11/27/21.

## 2021-10-28 NOTE — Telephone Encounter (Signed)
See open telephone encounter dated 10/23/21.   Encounter closed.  

## 2021-10-29 ENCOUNTER — Other Ambulatory Visit: Payer: Self-pay | Admitting: Obstetrics and Gynecology

## 2021-10-29 ENCOUNTER — Other Ambulatory Visit (HOSPITAL_COMMUNITY): Payer: Self-pay | Admitting: Obstetrics and Gynecology

## 2021-10-29 DIAGNOSIS — N939 Abnormal uterine and vaginal bleeding, unspecified: Secondary | ICD-10-CM

## 2021-10-29 NOTE — Telephone Encounter (Signed)
Left message to call Daniah Zaldivar, RN at [facility], [phone], OPT 5.  

## 2021-10-31 NOTE — Telephone Encounter (Signed)
Spoke with patient. Surgery date request confirmed.  Advised surgery is scheduled for 11/24/21, [facility], 0840. Surgery instruction sheet and hospital brochure reviewed, printed copy will be mailed.  Patient verbalizes understanding and is agreeable.     Dr. Oscar La -OV 10/23/21, will patient need additional pre-op?

## 2021-10-31 NOTE — Telephone Encounter (Signed)
Left message to call Jaliza Seifried, RN at [facility], [phone].  

## 2021-11-02 NOTE — Progress Notes (Signed)
Virtual Visit via Telephone Note   Because of Rhonda Colon's co-morbid illnesses, she is at least at moderate risk for complications without adequate follow up.  This format is felt to be most appropriate for this patient at this time.  The patient did not have access to video technology/had technical difficulties with video requiring transitioning to audio format only (telephone).  All issues noted in this document were discussed and addressed.  No physical exam could be performed with this format.  Please refer to the patient's chart for her consent to telehealth for [facility].  Evaluation Performed:  Preoperative cardiovascular risk assessment _____________   Date:  11/02/2021   Patient ID:  Rhonda Colon, DOB 09/11/1975, MRN [id number] Patient Location:  Home Provider location:   Office  Primary Care Provider:  Margaree Mackintosh, MD Primary Cardiologist:  Rollene Rotunda, MD  Chief Complaint / Patient Profile   46 y.o. y/o female with a h/o NSTEMI 07/2011 with RCA 99% with DES 3.0 x 16 mm, otherwise normal coronaries, EF 50% with inferior HK, hyperlipidemia, obesity, former tobacco abuse who is pending hysteroscopy D & C myosure insertion u/s guided and presents today for telephonic preoperative cardiovascular risk assessment.  Past Medical History    Past Medical History:  Diagnosis Date   Anxiety    CAD (coronary artery disease)    a. 07/2011 NSTEMI/Cath: RCA 99%m, otw nl cors, EF 50%, inf hk -> RCA stented with 3.0x53mm Promus DES   Depression    Diaphragmatic hernia    GERD (gastroesophageal reflux disease)    Hyperlipidemia    Hypertension    Morgagni hernia    a. Discovered incidentally on CT 07/2011 ->f/u with Dr. Lindie Spruce   Myocardial infarction [facility])    Obesity    Sepsis (HCC)    Sinus bradycardia    a. Nocturnal, asymptomatic   Sun allergy    Tobacco abuse    a. quit 07/2011   Past Surgical History:  Procedure Laterality Date   CARDIAC CATHETERIZATION   08/10/2011   left heart, with angiogram; DES mid RCA   DIAPHRAGMATIC HERNIA REPAIR  01/2021   with mesh   DIAPHRAGMATIC HERNIA REPAIR  06/02/2021   with mesh removal due to sepsis   INTERCOSTAL NERVE BLOCK Right 05/22/2021   Procedure: INTERCOSTAL NERVE BLOCK;  Surgeon: Corliss Skains, MD;  Location: [facility] OR;  Service: Thoracic;  Laterality: Right;   IR RADIOLOGIST EVAL & MGMT  05/05/2021   LEFT HEART CATHETERIZATION WITH CORONARY ANGIOGRAM N/A 08/10/2011   Procedure: LEFT HEART CATHETERIZATION WITH CORONARY ANGIOGRAM;  Surgeon: Kathleene Hazel, MD;  Location: [facility] CATH LAB;  Service: Cardiovascular;  Laterality: N/A;   TONSILLECTOMY      Allergies  Allergies  Allergen Reactions   Wound Dressing Adhesive Dermatitis and Rash    History of Present Illness    Rhonda Colon is a 46 y.o. female who presents via audio/video conferencing for a telehealth visit today.  Pt was last seen in cardiology clinic on 07/22/21 by Dr. Antoine Poche.  At that time Rhonda Colon was having shortness of breath with minimal exertion; BNP was normal. The patient is now pending procedure as outlined above. Since her last visit, she  denies chest pain, shortness of breath, lower extremity edema, fatigue, palpitations, melena, hematuria, hemoptysis, diaphoresis, weakness, presyncope, syncope, orthopnea, and PND.  Home Medications    Prior to Admission medications   Medication Sig Start Date End Date Taking? Authorizing Provider  acetaminophen (  TYLENOL) 500 MG tablet Take 500 mg by mouth every 6 (six) hours as needed for moderate pain or fever.    [provider]  ALPRAZolam Prudy Feeler) 1 MG tablet Take 0.5-1 tablets (0.5-1 mg total) by mouth 2 (two) times daily as needed for anxiety. 06/05/21   Ardelle Balls, PA-C  aspirin 81 MG tablet Take 81 mg by mouth daily.    [provider]  cetirizine (ZYRTEC) 10 MG tablet Take 10 mg by mouth daily as needed for allergies.    [provider]  FLUoxetine (PROZAC) 40 MG capsule Take 1 capsule by mouth once daily 07/08/21   Margaree Mackintosh, MD  gabapentin (NEURONTIN) 100 MG capsule Take one tablet po qhs, if tolerating can increase to 2 tablets po qhs after 3 days, if tolerating can increase to 3 tablets po qhs after another 3 days. Patient not taking: Reported on 10/27/2021 09/23/21   Romualdo Bolk, MD  lisinopril (ZESTRIL) 10 MG tablet Take 1 tablet (10 mg total) by mouth daily. 07/30/21   Rollene Rotunda, MD  misoprostol (CYTOTEC) 200 MCG tablet Place 2 tablets vaginally the night prior to surgery Patient not taking: Reported on 10/27/2021 10/23/21   Romualdo Bolk, MD  Multiple Vitamin (MULTIVITAMIN) capsule Take 1 capsule by mouth daily.    [provider]  nitroGLYCERIN (NITROSTAT) 0.4 MG SL tablet Place 1 tablet (0.4 mg total) under the tongue every 5 (five) minutes as needed for chest pain. 06/17/20   Rollene Rotunda, MD  omeprazole (PRILOSEC) 20 MG capsule Take 1 capsule by mouth once daily 03/09/21   Margaree Mackintosh, MD  rosuvastatin (CRESTOR) 40 MG tablet Take 1 tablet (40 mg total) by mouth daily. Patient taking differently: Take 40 mg by mouth every evening. 07/30/21   Rollene Rotunda, MD    Physical Exam    Vital Signs:  Danyka A Vinsant does not have vital signs available for review today.  Given telephonic nature of communication, physical exam is limited. AAOx3. NAD. Normal affect.  Speech and respirations are unlabored.  Accessory Clinical Findings    None  Assessment & Plan    1.  Preoperative Cardiovascular Risk Assessment: The patient is doing well from a cardiac perspective. Therefore, based on ACC/AHA guidelines, the patient would be at acceptable risk for the planned procedure without further cardiovascular testing. The patient was advised that if he develops new symptoms prior to surgery to contact our office to arrange for a follow-up visit, and he verbalized understanding.  Ideally aspirin should be continued without interruption, however if the bleeding risk is too great, aspirin may be held for 7 days prior to surgery. Please resume aspirin post operatively when it is felt to be safe from a bleeding standpoint.   A copy of this note will be routed to requesting surgeon.  Time:   Today, I have spent 7 minutes with the patient with telehealth technology discussing medical history, symptoms, and management plan.     Levi Aland, NP-C    11/03/2021, 9:44 AM [facility] [address], [address] Office [phone] Fax [phone]

## 2021-11-03 ENCOUNTER — Ambulatory Visit (INDEPENDENT_AMBULATORY_CARE_PROVIDER_SITE_OTHER): Payer: 59 | Admitting: Nurse Practitioner

## 2021-11-03 ENCOUNTER — Encounter: Payer: Self-pay | Admitting: Nurse Practitioner

## 2021-11-03 DIAGNOSIS — Z01810 Encounter for preprocedural cardiovascular examination: Secondary | ICD-10-CM | POA: Diagnosis not present

## 2021-11-03 NOTE — Telephone Encounter (Signed)
Cardiac clearance received, see note in Epic dated  11/03/21.   1.  Preoperative Cardiovascular Risk Assessment: The patient is doing well from a cardiac perspective. Therefore, based on ACC/AHA guidelines, the patient would be at acceptable risk for the planned procedure without further cardiovascular testing. The patient was advised that if he develops new symptoms prior to surgery to contact our office to arrange for a follow-up visit, and he verbalized understanding. Ideally aspirin should be continued without interruption, however if the bleeding risk is too great, aspirin may be held for 7 days prior to surgery. Please resume aspirin post operatively when it is felt to be safe from a bleeding standpoint.    A copy of this note will be routed to requesting surgeon.  Routing to Dr. Oscar La

## 2021-11-05 NOTE — Telephone Encounter (Signed)
Spoke with patient regarding surgery benefits. Patient acknowledges understanding of information presented. Patient is aware that benefits presented are professional benefits only. Patient is aware the hospital will call with facility benefits. See account note.    

## 2021-11-06 NOTE — Telephone Encounter (Signed)
Spoke with patient.  Notified of surgery time change, [facility] on 11/24/21 at 0730, arrive at 0530. Pre-op scheduled.   Encounter closed.

## 2021-11-12 ENCOUNTER — Encounter (HOSPITAL_BASED_OUTPATIENT_CLINIC_OR_DEPARTMENT_OTHER): Payer: Self-pay | Admitting: Obstetrics and Gynecology

## 2021-11-12 NOTE — Progress Notes (Signed)
Spoke w/ via phone for pre-op interview--- pt Lab needs dos----  [employer], urine preg (per anes)/  pre-op orders pending             Lab results------ current ekg/ cxr in epic/ chart COVID test -----patient states asymptomatic no test needed Arrive at -------  0530 on 11-24-2021 NPO after MN NO Solid Food.  Clear liquids from MN until--- 0430 Med rec completed Medications to take morning of surgery ----- prozac, prilosec Diabetic medication ----- n/a Patient instructed no nail polish to be worn day of surgery Patient instructed to bring photo id and insurance card day of surgery Patient aware to have Driver (ride ) / caregiver  for 24 hours after surgery --  mother, kay Patient Special Instructions ----- n/a Pre-Op special Istructions ----- pt has telephone visit cardiac clearance by Eligha Bridegroom NP on 11-03-2021 in epic/ chart Patient verbalized understanding of instructions that were given at this phone interview. Patient denies shortness of breath, chest pain, fever, cough at this phone interview.    Anesthesia Review:  BMI 49.85  (5' 4.5"  295 lb stated per pt);  HTN;  hx NSTEMI/ CAD 05/ 2013 s/p PCI w/ DES to RCA;  pt had re-do diaphragmatic hernia repair 06-02-2021 after infected mesh w/ sepsis,  she has been released from surgeon , Dr Laurice Record, per note 10-17-2021. Pt denies any cardiac s&s, sob, and no peripheral swelling  PCP:  Dr Lenord Fellers (lov 06-10-2021 epic) Cardiologist : Dr Antoine Poche Theron [name] 07-22-2021 epic) Chest x-ray :  10-17-2021 EKG :  07-22-2021 Echo :  08-09-2011 Stress test: no Cardiac Cath : 08-10-2011 Activity level: denies sob w/ any activity Sleep Study/ CPAP : no  Blood Thinner/ Instructions /Last Dose: no ASA / Instructions/ Last Dose :  ASA 81mg /  pt stated was given instructions from dr to stop 7 days prior to surgery,  pt stated last dose will be 11-16-2021, clearance was given by cardiology.

## 2021-11-13 ENCOUNTER — Encounter: Payer: Self-pay | Admitting: Obstetrics and Gynecology

## 2021-11-13 ENCOUNTER — Ambulatory Visit (INDEPENDENT_AMBULATORY_CARE_PROVIDER_SITE_OTHER): Payer: 59 | Admitting: Obstetrics and Gynecology

## 2021-11-13 VITALS — BP 138/72 | HR 76 | Ht 64.0 in | Wt 304.0 lb

## 2021-11-13 DIAGNOSIS — N939 Abnormal uterine and vaginal bleeding, unspecified: Secondary | ICD-10-CM

## 2021-11-13 DIAGNOSIS — Z8679 Personal history of other diseases of the circulatory system: Secondary | ICD-10-CM | POA: Diagnosis not present

## 2021-11-13 DIAGNOSIS — N882 Stricture and stenosis of cervix uteri: Secondary | ICD-10-CM | POA: Diagnosis not present

## 2021-11-13 DIAGNOSIS — Z6841 Body Mass Index (BMI) 40.0 and over, adult: Secondary | ICD-10-CM | POA: Diagnosis not present

## 2021-11-13 NOTE — Progress Notes (Signed)
GYNECOLOGY  VISIT   HPI: 46 y.o.   Significant Other White or Caucasian Not Hispanic or Latino  female   G0P0000 with Patient's last menstrual period was 08/14/2021.   here for a preoperative visit. She has a h/o AUB. U/S 10/24/21: endometrium 6.65 mm, sonohysterogram with suboptimal filling of the endometrium, concerning for a polyp. 10/24/21 Endometrial biopsy: proliferative endometrium  She is sexually active without contraception. Last sexually active 1.5 weeks ago. She will abstain prior to surgery or use condoms.   GYNECOLOGIC HISTORY: Patient's last menstrual period was 08/14/2021. Contraception:none Menopausal hormone therapy: none        OB History     Gravida  0   Para  0   Term  0   Preterm  0   AB  0   Living  0      SAB  0   IAB  0   Ectopic  0   Multiple  0   Live Births  0              Patient Active Problem List   Diagnosis Date Noted   Antibiotic-associated diarrhea 07/02/2021   Empyema lung (HCC) 07/01/2021   Medication monitoring encounter 07/01/2021   Infected prosthetic mesh of abdominal wall (HCC) 07/01/2021   Pleural effusion 05/17/2021   Sepsis due to intrathoracic abscess (HCC) 05/16/2021   Diaphragmatic hernia 02/24/2021   Vulvovaginitis 01/31/2021   Dyslipidemia 06/16/2020   Other fatigue 08/31/2017   Shortness of breath on exertion 08/31/2017   Essential hypertension 08/31/2017   Depression with anxiety 12/10/2015   Urticaria, solar 08/08/2013   CAD (coronary artery disease) 08/28/2011   Chronic low back pain 07/27/2011    Past Medical History:  Diagnosis Date   Anxiety    CAD (coronary artery disease) 07/2011   cardiologist--- dr Antoine Poche--   a. 07/2011 NSTEMI/Cath: RCA 99%m, otw nl cors, EF 50%, inf hk -> RCA stented with 3.0x52mm Promus DES   Depression    GERD (gastroesophageal reflux disease)    History of non-ST elevation myocardial infarction (NSTEMI) 07/2011   s/p PCI and stenting   History of sepsis 04/2021    hospital admission---  sepsis due to infected mesh post op diaphramatic hernia repair,  intrathoracic abscess w/ pleural effusion   Hyperlipidemia    Hypertension    Morgagni hernia    followed by dr h. littlefoot (cardiac / thoracic surgeon)  lov note in epic 10-17-2021 pt released prn//    a. Discovered incidentally on CT 07/2011  (diaphragmatic hernia);    02-24-2021  s/p repair with mesh;  hernia recurrence 04-04-2021/  02/ 2023 infected mesh w/ sepsis,  s/p unroof abscess/ pleural irrigation system placed/   re-do hernia repair 06-02-2021   Obesity    S/P drug eluting coronary stent placement 08/10/2011   DES x1  to  mRCA   Sinus bradycardia    a. Nocturnal, asymptomatic   Sun allergy    per pt dx via testing    Past Surgical History:  Procedure Laterality Date   CARDIAC CATHETERIZATION  08/10/2011   left heart, with angiogram; DES mid RCA   DIAPHRAGMATIC HERNIA REPAIR  06/02/2021   with mesh removal due to sepsis   INTERCOSTAL NERVE BLOCK Right 05/22/2021   Procedure: INTERCOSTAL NERVE BLOCK;  Surgeon: Corliss Skains, MD;  Location: [facility] OR;  Service: Thoracic;  Laterality: Right;   IR RADIOLOGIST EVAL & MGMT  05/05/2021   LEFT HEART CATHETERIZATION WITH CORONARY ANGIOGRAM N/A  08/10/2011   Procedure: LEFT HEART CATHETERIZATION WITH CORONARY ANGIOGRAM;  Surgeon: Christopher D McAlhany, MD;  Location: [facility] CATH LAB;  Service: Cardiovascular;  Laterality: N/A;   TONSILLECTOMY     child   XI ROBOTIC ASSISTED REPAIR OF DIAPHRAGMATIC HERNIA  02/24/2021   @[facility]  by dr lighfoot;   w/ mesh    Current Outpatient Medications  Medication Sig Dispense Refill   acetaminophen (TYLENOL) 500 MG tablet Take 500 mg by mouth every 6 (six) hours as needed for moderate pain or fever.     ALPRAZolam (XANAX) 1 MG tablet Take 0.5-1 tablets (0.5-1 mg total) by mouth 2 (two) times daily as needed for anxiety.     aspirin 81 MG tablet Take 81 mg by mouth daily.     cetirizine (ZYRTEC) 10 MG tablet  Take 10 mg by mouth daily as needed for allergies.     FLUoxetine (PROZAC) 40 MG capsule Take 1 capsule by mouth once daily (Patient taking differently: Take 40 mg by mouth daily.) 90 capsule 0   gabapentin (NEURONTIN) 100 MG capsule Take one tablet po qhs, if tolerating can increase to 2 tablets po qhs after 3 days, if tolerating can increase to 3 tablets po qhs after another 3 days. 90 capsule 0   lisinopril (ZESTRIL) 10 MG tablet Take 1 tablet (10 mg total) by mouth daily. (Patient taking differently: Take 10 mg by mouth daily.) 90 tablet 3   misoprostol (CYTOTEC) 200 MCG tablet Place 2 tablets vaginally the night prior to surgery 2 tablet 0   Multiple Vitamin (MULTIVITAMIN) capsule Take 1 capsule by mouth daily.     nitroGLYCERIN (NITROSTAT) 0.4 MG SL tablet Place 1 tablet (0.4 mg total) under the tongue every 5 (five) minutes as needed for chest pain. 25 tablet 3   omeprazole (PRILOSEC) 20 MG capsule Take 1 capsule by mouth once daily (Patient taking differently: Take 20 mg by mouth daily.) 90 capsule 3   rosuvastatin (CRESTOR) 40 MG tablet Take 1 tablet (40 mg total) by mouth daily. (Patient taking differently: Take 40 mg by mouth every evening.) 30 tablet 3   No current facility-administered medications for this visit.   She hasn't started the gabapentin yet (for hot flashes)  ALLERGIES: Wound dressing adhesive  Family History  Problem Relation Age of Onset   Coronary artery disease Mother 52        stent   Heart attack Mother    Heart disease Mother    Hyperlipidemia Mother    Obesity Mother    Colon polyps Mother    Arrhythmia Father    Depression Father    Breast cancer Maternal Grandmother    Coronary artery disease Maternal Aunt 61       (Mother's twin sister)   Lung cancer Maternal Aunt    Heart attack Maternal Aunt        mother's twin   Colon polyps Maternal Aunt    Colon cancer Maternal Uncle    Coronary artery disease Maternal Uncle 46   Heart attack Maternal Uncle     Breast cancer Cousin 38   Cancer Neg Hx    Early death Neg Hx    Hypertension Neg Hx    Kidney disease Neg Hx    Stroke Neg Hx    Alcohol abuse Neg Hx    Diabetes Neg Hx    Drug abuse Neg Hx     Social History   Socioeconomic History   Marital status: Significant Other      Spouse name: Not on file   Number of children: 0   Years of education: Not on file   Highest education level: Not on file  Occupational History   Occupation: Realtor  Tobacco Use   Smoking status: Former    Packs/day: 1.00    Years: 18.00    Total pack years: 18.00    Types: Cigarettes    Quit date: 08/09/2011    Years since quitting: 10.2   Smokeless tobacco: Never   Tobacco comments:    2013  Vaping Use   Vaping Use: Never used  Substance and Sexual Activity   Alcohol use: Not Currently    Comment: occasionally   Drug use: Never   Sexual activity: Yes    Birth control/protection: None  Other Topics Concern   Not on file  Social History Narrative   Lives alone.  Occasional EtOH.  Works as a Veterinary surgeon.   Social Determinants of Health   Financial Resource Strain: Not on file  Food Insecurity: Not on file  Transportation Needs: Not on file  Physical Activity: Not on file  Stress: Not on file  Social Connections: Not on file  Intimate Partner Violence: Not on file    ROS  PHYSICAL EXAMINATION:    BP 138/72   Pulse 76   Ht 5\' 4"  (1.626 m)   Wt (!) 304 lb (137.9 kg)   LMP 08/14/2021   SpO2 100%   BMI 52.18 kg/m     General appearance: alert, cooperative and appears stated age Neck: no adenopathy, supple, symmetrical, trachea midline and thyroid normal to inspection and palpation Heart: regular rate and rhythm Lungs: CTAB Abdomen: soft, non-tender; bowel sounds normal; no masses,  no organomegaly Extremities: normal, atraumatic, no cyanosis Skin: normal color, texture and turgor, no rashes or lesions Lymph: normal cervical supraclavicular and inguinal nodes Neurologic: grossly  normal   1. Abnormal uterine bleeding U/S concerning for polyp, biopsy benign Plan: hysteroscopy,  dilation and curettage under u/s guidance. Will place Mirena IUD. Reviewed risks, including: bleeding, infection, uterine perforation, fluid overload, need for further surgery  2. Cervical stenosis (uterine cervix) Will pretreat with cytotec Will do surgery with ultrasound guidance  3. History of CAD (coronary artery disease) Has been cleared for surgery by Cardiology Will stop ASA 1 week prior to surgery  4. BMI 50.0-59.9, adult (HCC) Recent HgbA1C was 5.5%

## 2021-11-13 NOTE — H&P (View-Only) (Signed)
GYNECOLOGY  VISIT   HPI: 46 y.o.   Significant Other White or Caucasian Not Hispanic or Latino  female   G0P0000 with Patient's last menstrual period was 08/14/2021.   here for a preoperative visit. She has a h/o AUB. U/S 10/24/21: endometrium 6.65 mm, sonohysterogram with suboptimal filling of the endometrium, concerning for a polyp. 10/24/21 Endometrial biopsy: proliferative endometrium  She is sexually active without contraception. Last sexually active 1.5 weeks ago. She will abstain prior to surgery or use condoms.   GYNECOLOGIC HISTORY: Patient's last menstrual period was 08/14/2021. Contraception:none Menopausal hormone therapy: none        OB History     Gravida  0   Para  0   Term  0   Preterm  0   AB  0   Living  0      SAB  0   IAB  0   Ectopic  0   Multiple  0   Live Births  0              Patient Active Problem List   Diagnosis Date Noted   Antibiotic-associated diarrhea 07/02/2021   Empyema lung (HCC) 07/01/2021   Medication monitoring encounter 07/01/2021   Infected prosthetic mesh of abdominal wall (HCC) 07/01/2021   Pleural effusion 05/17/2021   Sepsis due to intrathoracic abscess (HCC) 05/16/2021   Diaphragmatic hernia 02/24/2021   Vulvovaginitis 01/31/2021   Dyslipidemia 06/16/2020   Other fatigue 08/31/2017   Shortness of breath on exertion 08/31/2017   Essential hypertension 08/31/2017   Depression with anxiety 12/10/2015   Urticaria, solar 08/08/2013   CAD (coronary artery disease) 08/28/2011   Chronic low back pain 07/27/2011    Past Medical History:  Diagnosis Date   Anxiety    CAD (coronary artery disease) 07/2011   cardiologist--- dr Antoine Poche--   a. 07/2011 NSTEMI/Cath: RCA 99%m, otw nl cors, EF 50%, inf hk -> RCA stented with 3.0x52mm Promus DES   Depression    GERD (gastroesophageal reflux disease)    History of non-ST elevation myocardial infarction (NSTEMI) 07/2011   s/p PCI and stenting   History of sepsis 04/2021    hospital admission---  sepsis due to infected mesh post op diaphramatic hernia repair,  intrathoracic abscess w/ pleural effusion   Hyperlipidemia    Hypertension    Morgagni hernia    followed by dr h. littlefoot (cardiac / thoracic surgeon)  lov note in epic 10-17-2021 pt released prn//    a. Discovered incidentally on CT 07/2011  (diaphragmatic hernia);    02-24-2021  s/p repair with mesh;  hernia recurrence 04-04-2021/  02/ 2023 infected mesh w/ sepsis,  s/p unroof abscess/ pleural irrigation system placed/   re-do hernia repair 06-02-2021   Obesity    S/P drug eluting coronary stent placement 08/10/2011   DES x1  to  mRCA   Sinus bradycardia    a. Nocturnal, asymptomatic   Sun allergy    per pt dx via testing    Past Surgical History:  Procedure Laterality Date   CARDIAC CATHETERIZATION  08/10/2011   left heart, with angiogram; DES mid RCA   DIAPHRAGMATIC HERNIA REPAIR  06/02/2021   with mesh removal due to sepsis   INTERCOSTAL NERVE BLOCK Right 05/22/2021   Procedure: INTERCOSTAL NERVE BLOCK;  Surgeon: Corliss Skains, MD;  Location: [facility] OR;  Service: Thoracic;  Laterality: Right;   IR RADIOLOGIST EVAL & MGMT  05/05/2021   LEFT HEART CATHETERIZATION WITH CORONARY ANGIOGRAM N/A  08/10/2011   Procedure: LEFT HEART CATHETERIZATION WITH CORONARY ANGIOGRAM;  Surgeon: Kathleene Hazel, MD;  Location: [facility] CATH LAB;  Service: Cardiovascular;  Laterality: N/A;   TONSILLECTOMY     child   XI ROBOTIC ASSISTED REPAIR OF DIAPHRAGMATIC HERNIA  02/24/2021   @[facility]   by dr ;   w/ mesh    Current Outpatient Medications  Medication Sig Dispense Refill   acetaminophen (TYLENOL) 500 MG tablet Take 500 mg by mouth every 6 (six) hours as needed for moderate pain or fever.     ALPRAZolam (XANAX) 1 MG tablet Take 0.5-1 tablets (0.5-1 mg total) by mouth 2 (two) times daily as needed for anxiety.     aspirin 81 MG tablet Take 81 mg by mouth daily.     cetirizine (ZYRTEC) 10 MG tablet  Take 10 mg by mouth daily as needed for allergies.     FLUoxetine (PROZAC) 40 MG capsule Take 1 capsule by mouth once daily (Patient taking differently: Take 40 mg by mouth daily.) 90 capsule 0   gabapentin (NEURONTIN) 100 MG capsule Take one tablet po qhs, if tolerating can increase to 2 tablets po qhs after 3 days, if tolerating can increase to 3 tablets po qhs after another 3 days. 90 capsule 0   lisinopril (ZESTRIL) 10 MG tablet Take 1 tablet (10 mg total) by mouth daily. (Patient taking differently: Take 10 mg by mouth daily.) 90 tablet 3   misoprostol (CYTOTEC) 200 MCG tablet Place 2 tablets vaginally the night prior to surgery 2 tablet 0   Multiple Vitamin (MULTIVITAMIN) capsule Take 1 capsule by mouth daily.     nitroGLYCERIN (NITROSTAT) 0.4 MG SL tablet Place 1 tablet (0.4 mg total) under the tongue every 5 (five) minutes as needed for chest pain. 25 tablet 3   omeprazole (PRILOSEC) 20 MG capsule Take 1 capsule by mouth once daily (Patient taking differently: Take 20 mg by mouth daily.) 90 capsule 3   rosuvastatin (CRESTOR) 40 MG tablet Take 1 tablet (40 mg total) by mouth daily. (Patient taking differently: Take 40 mg by mouth every evening.) 30 tablet 3   No current facility-administered medications for this visit.   She hasn't started the gabapentin yet (for hot flashes)  ALLERGIES: Wound dressing adhesive  Family History  Problem Relation Age of Onset   Coronary artery disease Mother 59        stent   Heart attack Mother    Heart disease Mother    Hyperlipidemia Mother    Obesity Mother    Colon polyps Mother    Arrhythmia Father    Depression Father    Breast cancer Maternal Grandmother    Coronary artery disease Maternal Aunt 84       (Mother's twin sister)   Lung cancer Maternal Aunt    Heart attack Maternal Aunt        mother's twin   Colon polyps Maternal Aunt    Colon cancer Maternal Uncle    Coronary artery disease Maternal Uncle 46   Heart attack Maternal Uncle     Breast cancer Cousin 38   Cancer Neg Hx    Early death Neg Hx    Hypertension Neg Hx    Kidney disease Neg Hx    Stroke Neg Hx    Alcohol abuse Neg Hx    Diabetes Neg Hx    Drug abuse Neg Hx     Social History   Socioeconomic History   Marital status: Significant Other  Spouse name: Not on file   Number of children: 0   Years of education: Not on file   Highest education level: Not on file  Occupational History   Occupation: Realtor  Tobacco Use   Smoking status: Former    Packs/day: 1.00    Years: 18.00    Total pack years: 18.00    Types: Cigarettes    Quit date: 08/09/2011    Years since quitting: 10.2   Smokeless tobacco: Never   Tobacco comments:    2013  Vaping Use   Vaping Use: Never used  Substance and Sexual Activity   Alcohol use: Not Currently    Comment: occasionally   Drug use: Never   Sexual activity: Yes    Birth control/protection: None  Other Topics Concern   Not on file  Social History Narrative   Lives alone.  Occasional EtOH.  Works as a Veterinary surgeon.   Social Determinants of Health   Financial Resource Strain: Not on file  Food Insecurity: Not on file  Transportation Needs: Not on file  Physical Activity: Not on file  Stress: Not on file  Social Connections: Not on file  Intimate Partner Violence: Not on file    ROS  PHYSICAL EXAMINATION:    BP 138/72   Pulse 76   Ht 5\' 4"  (1.626 m)   Wt (!) 304 lb (137.9 kg)   LMP 08/14/2021   SpO2 100%   BMI 52.18 kg/m     General appearance: alert, cooperative and appears stated age Neck: no adenopathy, supple, symmetrical, trachea midline and thyroid normal to inspection and palpation Heart: regular rate and rhythm Lungs: CTAB Abdomen: soft, non-tender; bowel sounds normal; no masses,  no organomegaly Extremities: normal, atraumatic, no cyanosis Skin: normal color, texture and turgor, no rashes or lesions Lymph: normal cervical supraclavicular and inguinal nodes Neurologic: grossly  normal   1. Abnormal uterine bleeding U/S concerning for polyp, biopsy benign Plan: hysteroscopy,  dilation and curettage under u/s guidance. Will place Mirena IUD. Reviewed risks, including: bleeding, infection, uterine perforation, fluid overload, need for further surgery  2. Cervical stenosis (uterine cervix) Will pretreat with cytotec Will do surgery with ultrasound guidance  3. History of CAD (coronary artery disease) Has been cleared for surgery by Cardiology Will stop ASA 1 week prior to surgery  4. BMI 50.0-59.9, adult (HCC) Recent HgbA1C was 5.5%

## 2021-11-21 ENCOUNTER — Encounter (HOSPITAL_BASED_OUTPATIENT_CLINIC_OR_DEPARTMENT_OTHER): Payer: Self-pay | Admitting: Obstetrics and Gynecology

## 2021-11-24 ENCOUNTER — Encounter (HOSPITAL_BASED_OUTPATIENT_CLINIC_OR_DEPARTMENT_OTHER): Payer: Self-pay | Admitting: Obstetrics and Gynecology

## 2021-11-24 ENCOUNTER — Other Ambulatory Visit: Payer: Self-pay

## 2021-11-24 ENCOUNTER — Ambulatory Visit (HOSPITAL_BASED_OUTPATIENT_CLINIC_OR_DEPARTMENT_OTHER)
Admission: RE | Admit: 2021-11-24 | Discharge: 2021-11-24 | Disposition: A | Discharge status: Home / Self Care | Payer: 59 | Attending: Obstetrics and Gynecology | Admitting: Obstetrics and Gynecology

## 2021-11-24 ENCOUNTER — Ambulatory Visit (HOSPITAL_COMMUNITY)
Admission: RE | Admit: 2021-11-24 | Discharge: 2021-11-24 | Disposition: A | Discharge status: Home / Self Care | Payer: 59 | Source: Ambulatory Visit | Attending: Obstetrics and Gynecology | Admitting: Obstetrics and Gynecology

## 2021-11-24 ENCOUNTER — Ambulatory Visit (HOSPITAL_BASED_OUTPATIENT_CLINIC_OR_DEPARTMENT_OTHER): Payer: 59 | Admitting: Anesthesiology

## 2021-11-24 ENCOUNTER — Encounter (HOSPITAL_BASED_OUTPATIENT_CLINIC_OR_DEPARTMENT_OTHER)
Admission: RE | Disposition: A | Discharge status: Home / Self Care | Payer: Self-pay | Source: Home / Self Care | Attending: Obstetrics and Gynecology

## 2021-11-24 DIAGNOSIS — F419 Anxiety disorder, unspecified: Secondary | ICD-10-CM | POA: Insufficient documentation

## 2021-11-24 DIAGNOSIS — Z30430 Encounter for insertion of intrauterine contraceptive device: Secondary | ICD-10-CM

## 2021-11-24 DIAGNOSIS — I2510 Atherosclerotic heart disease of native coronary artery without angina pectoris: Secondary | ICD-10-CM | POA: Insufficient documentation

## 2021-11-24 DIAGNOSIS — Z87891 Personal history of nicotine dependence: Secondary | ICD-10-CM | POA: Diagnosis not present

## 2021-11-24 DIAGNOSIS — Z955 Presence of coronary angioplasty implant and graft: Secondary | ICD-10-CM | POA: Insufficient documentation

## 2021-11-24 DIAGNOSIS — I10 Essential (primary) hypertension: Secondary | ICD-10-CM | POA: Insufficient documentation

## 2021-11-24 DIAGNOSIS — N939 Abnormal uterine and vaginal bleeding, unspecified: Secondary | ICD-10-CM

## 2021-11-24 DIAGNOSIS — N882 Stricture and stenosis of cervix uteri: Secondary | ICD-10-CM

## 2021-11-24 DIAGNOSIS — I252 Old myocardial infarction: Secondary | ICD-10-CM | POA: Diagnosis not present

## 2021-11-24 DIAGNOSIS — Z3009 Encounter for other general counseling and advice on contraception: Secondary | ICD-10-CM | POA: Diagnosis not present

## 2021-11-24 DIAGNOSIS — Z6841 Body Mass Index (BMI) 40.0 and over, adult: Secondary | ICD-10-CM | POA: Insufficient documentation

## 2021-11-24 DIAGNOSIS — N938 Other specified abnormal uterine and vaginal bleeding: Secondary | ICD-10-CM | POA: Insufficient documentation

## 2021-11-24 DIAGNOSIS — Z975 Presence of (intrauterine) contraceptive device: Secondary | ICD-10-CM | POA: Diagnosis not present

## 2021-11-24 DIAGNOSIS — F32A Depression, unspecified: Secondary | ICD-10-CM | POA: Diagnosis not present

## 2021-11-24 DIAGNOSIS — Z01818 Encounter for other preprocedural examination: Secondary | ICD-10-CM

## 2021-11-24 HISTORY — PX: DILATATION & CURETTAGE/HYSTEROSCOPY WITH MYOSURE: SHX6511

## 2021-11-24 HISTORY — PX: INTRAUTERINE DEVICE (IUD) INSERTION: SHX5877

## 2021-11-24 HISTORY — PX: OPERATIVE ULTRASOUND: SHX5996

## 2021-11-24 LAB — POCT I-STAT, CHEM 8
BUN: 11 mg/dL (ref 6–20)
Calcium, Ion: 1.19 mmol/L (ref 1.15–1.40)
Chloride: 106 mmol/L (ref 98–111)
Creatinine, Ser: 0.6 mg/dL (ref 0.44–1.00)
Glucose, Bld: 117 mg/dL — ABNORMAL HIGH (ref 70–99)
HCT: 46 % (ref 36.0–46.0)
Hemoglobin: 15.6 g/dL — ABNORMAL HIGH (ref 12.0–15.0)
Potassium: 4.5 mmol/L (ref 3.5–5.1)
Sodium: 142 mmol/L (ref 135–145)
TCO2: 25 mmol/L (ref 22–32)

## 2021-11-24 LAB — POCT PREGNANCY, URINE: Preg Test, Ur: NEGATIVE

## 2021-11-24 SURGERY — DILATATION & CURETTAGE/HYSTEROSCOPY WITH MYOSURE
Anesthesia: General | Site: Vagina

## 2021-11-24 MED ORDER — MIDAZOLAM HCL 2 MG/2ML IJ SOLN
INTRAMUSCULAR | Status: DC | PRN
  Administered 2021-11-24: 2 mg via INTRAVENOUS

## 2021-11-24 MED ORDER — SCOPOLAMINE 1 MG/3DAYS TD PT72
MEDICATED_PATCH | TRANSDERMAL | Status: AC
  Filled 2021-11-24: qty 1

## 2021-11-24 MED ORDER — SODIUM CHLORIDE 0.9 % IV SOLN
INTRAVENOUS | Status: AC | PRN
  Administered 2021-11-24: 3000 mL

## 2021-11-24 MED ORDER — KETOROLAC TROMETHAMINE 30 MG/ML IJ SOLN
INTRAMUSCULAR | Status: DC | PRN
  Administered 2021-11-24: 30 mg via INTRAVENOUS

## 2021-11-24 MED ORDER — FENTANYL CITRATE (PF) 100 MCG/2ML IJ SOLN
INTRAMUSCULAR | Status: AC
  Filled 2021-11-24: qty 2

## 2021-11-24 MED ORDER — DEXMEDETOMIDINE (PRECEDEX) IN NS 20 MCG/5ML (4 MCG/ML) IV SYRINGE
PREFILLED_SYRINGE | INTRAVENOUS | Status: DC | PRN
  Administered 2021-11-24 (×2): 8 ug via INTRAVENOUS

## 2021-11-24 MED ORDER — LACTATED RINGERS IV SOLN: INTRAVENOUS | Status: DC

## 2021-11-24 MED ORDER — MIDAZOLAM HCL 2 MG/2ML IJ SOLN
INTRAMUSCULAR | Status: AC
  Filled 2021-11-24: qty 2

## 2021-11-24 MED ORDER — ONDANSETRON HCL 4 MG/2ML IJ SOLN
INTRAMUSCULAR | Status: DC | PRN
  Administered 2021-11-24: 4 mg via INTRAVENOUS

## 2021-11-24 MED ORDER — ACETAMINOPHEN 500 MG PO TABS
1000.0000 mg | ORAL_TABLET | Freq: Once | ORAL | Status: AC
  Administered 2021-11-24: 1000 mg via ORAL

## 2021-11-24 MED ORDER — ACETAMINOPHEN 500 MG PO TABS
ORAL_TABLET | ORAL | Status: AC
  Filled 2021-11-24: qty 2

## 2021-11-24 MED ORDER — FENTANYL CITRATE (PF) 250 MCG/5ML IJ SOLN
INTRAMUSCULAR | Status: DC | PRN
  Administered 2021-11-24: 50 ug via INTRAVENOUS
  Administered 2021-11-24 (×4): 25 ug via INTRAVENOUS

## 2021-11-24 MED ORDER — PROPOFOL 10 MG/ML IV BOLUS
INTRAVENOUS | Status: AC
  Filled 2021-11-24: qty 20

## 2021-11-24 MED ORDER — LIDOCAINE 2% (20 MG/ML) 5 ML SYRINGE
INTRAMUSCULAR | Status: DC | PRN
  Administered 2021-11-24: 100 mg via INTRAVENOUS

## 2021-11-24 MED ORDER — DEXAMETHASONE SODIUM PHOSPHATE 10 MG/ML IJ SOLN
INTRAMUSCULAR | Status: DC | PRN
  Administered 2021-11-24: 10 mg via INTRAVENOUS

## 2021-11-24 MED ORDER — SCOPOLAMINE 1 MG/3DAYS TD PT72
1.0000 | MEDICATED_PATCH | Freq: Once | TRANSDERMAL | Status: DC
  Administered 2021-11-24: 1.5 mg via TRANSDERMAL

## 2021-11-24 MED ORDER — LEVONORGESTREL 20 MCG/DAY IU IUD
1.0000 | INTRAUTERINE_SYSTEM | INTRAUTERINE | Status: AC
  Administered 2021-11-24: 1 via INTRAUTERINE

## 2021-11-24 MED ORDER — PROPOFOL 10 MG/ML IV BOLUS
INTRAVENOUS | Status: DC | PRN
  Administered 2021-11-24: 250 mg via INTRAVENOUS

## 2021-11-24 MED ORDER — LEVONORGESTREL 20 MCG/DAY IU IUD
INTRAUTERINE_SYSTEM | INTRAUTERINE | Status: AC
  Filled 2021-11-24: qty 1

## 2021-11-24 SURGICAL SUPPLY — 22 items
CATH ROBINSON RED A/P 16FR (CATHETERS) IMPLANT
DEVICE MYOSURE LITE (MISCELLANEOUS) IMPLANT
DEVICE MYOSURE REACH (MISCELLANEOUS) IMPLANT
DILATOR CANAL MILEX (MISCELLANEOUS) IMPLANT
DRSG TELFA 3X8 NADH (GAUZE/BANDAGES/DRESSINGS) ×2 IMPLANT
GAUZE 4X4 16PLY ~~LOC~~+RFID DBL (SPONGE) ×4 IMPLANT
GLOVE BIO SURGEON STRL SZ 6.5 (GLOVE) ×2 IMPLANT
GOWN STRL REUS W/TWL LRG LVL3 (GOWN DISPOSABLE) ×2 IMPLANT
IV NS IRRIG 3000ML ARTHROMATIC (IV SOLUTION) ×2 IMPLANT
KIT PROCEDURE FLUENT (KITS) ×2 IMPLANT
KIT TURNOVER CYSTO (KITS) ×2 IMPLANT
MYOSURE XL FIBROID (MISCELLANEOUS)
PACK VAGINAL MINOR WOMEN LF (CUSTOM PROCEDURE TRAY) ×2 IMPLANT
PAD DRESSING TELFA 3X8 NADH (GAUZE/BANDAGES/DRESSINGS) ×2 IMPLANT
PAD OB MATERNITY 4.3X12.25 (PERSONAL CARE ITEMS) ×2 IMPLANT
PAD PREP 24X48 CUFFED NSTRL (MISCELLANEOUS) ×2 IMPLANT
SEAL CERVICAL OMNI LOK (ABLATOR) IMPLANT
SEAL ROD LENS SCOPE MYOSURE (ABLATOR) ×2 IMPLANT
SYR 20ML LL LF (SYRINGE) IMPLANT
SYSTEM TISS REMOVAL MYOSURE XL (MISCELLANEOUS) IMPLANT
TOWEL OR 17X26 10 PK STRL BLUE (TOWEL DISPOSABLE) ×4 IMPLANT
WATER STERILE IRR 500ML POUR (IV SOLUTION) IMPLANT

## 2021-11-24 NOTE — Anesthesia Postprocedure Evaluation (Signed)
Anesthesia Post Note  Patient: Rhonda Colon  Procedure(s) Performed: DILATATION & CURETTAGE/HYSTEROSCOPY WITH MYOSURE (Vagina ) OPERATIVE ULTRASOUND (Abdomen) Mirena INTRAUTERINE DEVICE (IUD) INSERTION (Vagina )     Patient location during evaluation: PACU Anesthesia Type: General Level of consciousness: awake and alert Pain management: pain level controlled Vital Signs Assessment: post-procedure vital signs reviewed and stable Respiratory status: spontaneous breathing, nonlabored ventilation, respiratory function stable and patient connected to nasal cannula oxygen Cardiovascular status: blood pressure returned to baseline and stable Postop Assessment: no apparent nausea or vomiting Anesthetic complications: no   No notable events documented.  Last Vitals:  Vitals:   11/24/21 0845 11/24/21 0912  BP: (!) 150/97 136/72  Pulse: 93 86  Resp: 16 18  Temp:  36.7 C  SpO2: 94% 96%    Last Pain:  Vitals:   11/24/21 0912  TempSrc:   PainSc: 2                  [name] Schlichter

## 2021-11-24 NOTE — Interval H&P Note (Signed)
History and Physical Interval Note:  11/24/2021 7:13 AM  Rhonda Colon  has presented today for surgery, with the diagnosis of abnormal uterine bleeding, contraceptive.  The various methods of treatment have been discussed with the patient and family. After consideration of risks, benefits and other options for treatment, the patient has consented to  Procedure(s): DILATATION & CURETTAGE/HYSTEROSCOPY WITH MYOSURE (N/A) OPERATIVE ULTRASOUND (N/A) Mirena INTRAUTERINE DEVICE (IUD) INSERTION (N/A) as a surgical intervention.  The patient's history has been reviewed, patient examined, no change in status, stable for surgery.  I have reviewed the patient's chart and labs.  Questions were answered to the patient's satisfaction.     Romualdo Bolk

## 2021-11-24 NOTE — Discharge Instructions (Addendum)
   D & C Home care Instructions:   Personal hygiene:  Used sanitary napkins for vaginal drainage not tampons. Shower or tub bathe the day after your procedure. No douching until bleeding stops. Always wipe from front to back after  Elimination.  Activity: Do not drive or operate any equipment today. The effects of the anesthesia are still present and drowsiness may result. Rest today, not necessarily flat bed rest, just take it easy. You may resume your normal activity in one to 2 days.  Sexual activity: No intercourse for one week or as indicated by your physician  Diet: Eat a light diet as desired this evening. You may resume a regular diet tomorrow.  Return to work: One to 2 days.  General Expectations of your surgery: Vaginal bleeding should be no heavier than a normal period. Spotting may continue up to 10 days. Mild cramps may continue for a couple of days. You may have a regular period in 2-6 weeks.  Unexpected observations call your doctor if these occur: persistent or heavy bleeding. Severe abdominal cramping or pain. Elevation of temperature greater than 100F.  Call for an appointment in one week.  No Nsaids, motrin, advil, aleve , toradol or ibuprofen until after 2:15 PM     Post Anesthesia Home Care Instructions  Activity: Get plenty of rest for the remainder of the day. A responsible individual must stay with you for 24 hours following the procedure.  For the next 24 hours, DO NOT: -Drive a car -Advertising copywriter -Drink alcoholic beverages -Take any medication unless instructed by your physician -Make any legal decisions or sign important papers.  Meals: Start with liquid foods such as gelatin or soup. Progress to regular foods as tolerated. Avoid greasy, spicy, heavy foods. If nausea and/or vomiting occur, drink only clear liquids until the nausea and/or vomiting subsides. Call your physician if vomiting continues.  Special Instructions/Symptoms: Your throat may  feel dry or sore from the anesthesia or the breathing tube placed in your throat during surgery. If this causes discomfort, gargle with warm salt water. The discomfort should disappear within 24 hours.  If you had a scopolamine patch placed behind your ear for the management of post- operative nausea and/or vomiting:  1. The medication in the patch is effective for 72 hours, after which it should be removed.  Wrap patch in a tissue and discard in the trash. Wash hands thoroughly with soap and water. 2. You may remove the patch earlier than 72 hours if you experience unpleasant side effects which may include dry mouth, dizziness or visual disturbances. 3. Avoid touching the patch. Wash your hands with soap and water after contact with the patch.

## 2021-11-24 NOTE — Transfer of Care (Signed)
Immediate Anesthesia Transfer of Care Note  Patient: Rhonda Colon  Procedure(s) Performed: DILATATION & CURETTAGE/HYSTEROSCOPY WITH MYOSURE (Vagina ) OPERATIVE ULTRASOUND (Abdomen) Mirena INTRAUTERINE DEVICE (IUD) INSERTION (Vagina )  Patient Location: PACU  Anesthesia Type:General  Level of Consciousness: awake, alert  and oriented  Airway & Oxygen Therapy: Patient Spontanous Breathing  Post-op Assessment: Report given to RN and Post -op Vital signs reviewed and stable  Post vital signs: Reviewed and stable  Last Vitals:  Vitals Value Taken Time  BP 137/87 11/24/21 0826  Temp    Pulse 90 11/24/21 0828  Resp 12 11/24/21 0828  SpO2 94 % 11/24/21 0828  Vitals shown include unvalidated device data.  Last Pain:  Vitals:   11/24/21 0616  TempSrc: Oral  PainSc: 0-No pain      Patients Stated Pain Goal: 5 (11/24/21 0998)  Complications: No notable events documented.

## 2021-11-24 NOTE — Op Note (Signed)
Preoperative Diagnosis: abnormal uterine bleeding, cervical stenosis  Postoperative Diagnosis: same  Procedure: Hysteroscopy, dilation and curettage and mirena intrauterine device insertion, all under ultrasound guidance  Surgeon: Dr Gertie Exon  Assistants: None  Anesthesia: General via LMA  EBL: 5 cc  Fluids: 450 cc  Fluid deficit: 0  Urine output: 100 cc  Indications for surgery: The patient is a 46 yo female, who presented with abnormal uterine bleeding. Work up included a benign endometrial biopsy and an ultrasound concerning for a possible polyp. The risks of the surgery were reviewed with the patient and the consent form was signed prior to her surgery.  Findings: Hysteroscopy: normal endometrial cavity, no endometrial polyps, normal tubal ostia bilaterally. Ultrasound: anteverted uterus, noted proper placement of dilators, curette and mirena IUD. Post procedure transvaginal ultrasound confirmed proper IUD location  Specimens: endometrial curettings   Procedure: The patient was taken to the operating room with an IV in place. She was placed in the dorsal lithotomy position and anesthesia was administered. She was prepped and draped in the usual sterile fashion for a vaginal procedure. A weighted speculum was placed in the vagina and a single tooth tenaculum was placed on the anterior lip of the cervix. Her bladder was back filled with saline. Using ultrasound guidance the cervix was dilated to a #6 hagar dilator. The uterus was sounded to 8 cm. The myosure hysteroscope was inserted into the uterine cavity. With continuous infusion of normal saline, the uterine cavity was visualized with the above findings. The myosure was then removed. The cavity was then curetted with the small sharp curette. The cavity had the characteristically gritty texture at the end of the procedure. The curette was removed and the mirena IUD was inserted in the standard fashion with ultrasound guidance.  The IUD strings were cut to 3 cm. The single tooth tenaculum and speculum were removed. A transvaginal ultrasound was then performed confirming correct placement of the IUD. The ultrasound was removed. The patients perineum was cleansed of betadine and she was taken out of the dorsal lithotomy position.  Upon awakening the LMA was removed and the patient was transferred to the recovery room in stable and awake condition.  The sponge and instrument count were correct. There were no complications.

## 2021-11-24 NOTE — Anesthesia Preprocedure Evaluation (Signed)
Anesthesia Evaluation  Patient identified by MRN, date of birth, ID band Patient awake    Reviewed: Allergy & Precautions, NPO status , Patient's Chart, lab work & pertinent test results  Airway Mallampati: III  TM Distance: >3 FB Neck ROM: Full    Dental  (+) Teeth Intact, Dental Advisory Given   Pulmonary former smoker,    Pulmonary exam normal breath sounds clear to auscultation       Cardiovascular hypertension, Pt. on medications + CAD, + Past MI and + Cardiac Stents (DES to RCA on 08/10/11)  Normal cardiovascular exam Rhythm:Regular Rate:Normal     Neuro/Psych PSYCHIATRIC DISORDERS Anxiety Depression negative neurological ROS     GI/Hepatic Neg liver ROS, GERD  Medicated,  Endo/Other  Morbid obesity (BMI 53)  Renal/GU negative Renal ROS     Musculoskeletal negative musculoskeletal ROS (+)   Abdominal   Peds  Hematology negative hematology ROS (+)   Anesthesia Other Findings Day of surgery medications reviewed with the patient.  Reproductive/Obstetrics                             Anesthesia Physical Anesthesia Plan  ASA: 4  Anesthesia Plan: General   Post-op Pain Management: Tylenol PO (pre-op)*   Induction: Intravenous  PONV Risk Score and Plan: 4 or greater and Midazolam, Scopolamine patch - Pre-op, Dexamethasone and Ondansetron  Airway Management Planned: LMA and Oral ETT  Additional Equipment:   Intra-op Plan:   Post-operative Plan: Extubation in OR  Informed Consent: I have reviewed the patients History and Physical, chart, labs and discussed the procedure including the risks, benefits and alternatives for the proposed anesthesia with the patient or authorized representative who has indicated his/her understanding and acceptance.     Dental advisory given  Plan Discussed with: CRNA  Anesthesia Plan Comments:         Anesthesia Quick Evaluation

## 2021-11-24 NOTE — Anesthesia Procedure Notes (Signed)
Procedure Name: LMA Insertion Date/Time: 11/24/2021 7:43 AM  Performed by: Dairl Ponder, CRNAPre-anesthesia Checklist: Patient identified, Emergency Drugs available, Suction available and Patient being monitored Patient Re-evaluated:Patient Re-evaluated prior to induction Oxygen Delivery Method: Circle System Utilized Preoxygenation: Pre-oxygenation with 100% oxygen Induction Type: IV induction Ventilation: Mask ventilation without difficulty LMA: LMA inserted LMA Size: 4.0 Number of attempts: 1 Airway Equipment and Method: Bite block Placement Confirmation: positive ETCO2 Tube secured with: Tape Dental Injury: Teeth and Oropharynx as per pre-operative assessment

## 2021-11-25 ENCOUNTER — Encounter (HOSPITAL_BASED_OUTPATIENT_CLINIC_OR_DEPARTMENT_OTHER): Payer: Self-pay | Admitting: Obstetrics and Gynecology

## 2021-11-25 LAB — SURGICAL PATHOLOGY

## 2021-12-02 ENCOUNTER — Encounter: Payer: Self-pay | Admitting: Obstetrics and Gynecology

## 2021-12-02 ENCOUNTER — Ambulatory Visit (INDEPENDENT_AMBULATORY_CARE_PROVIDER_SITE_OTHER): Payer: 59 | Admitting: Obstetrics and Gynecology

## 2021-12-02 VITALS — BP 128/72 | HR 74 | Wt 311.0 lb

## 2021-12-02 DIAGNOSIS — N951 Menopausal and female climacteric states: Secondary | ICD-10-CM

## 2021-12-02 DIAGNOSIS — Z9889 Other specified postprocedural states: Secondary | ICD-10-CM

## 2021-12-02 DIAGNOSIS — Z30431 Encounter for routine checking of intrauterine contraceptive device: Secondary | ICD-10-CM

## 2021-12-02 NOTE — Progress Notes (Signed)
GYNECOLOGY  VISIT   HPI: 46 y.o.   Significant Other White or Caucasian Not Hispanic or Latino  female   [id number] with Patient's last menstrual period was 08/14/2021 (approximate).   here for f/u. She is s/o hysteroscopy, D&C, mirena IUD insertion with ultrasound guidance on 11/24/21. Pathology with inactive endometrium. She has been having light bleeding, intermittent mild cramping.   She is having hot flashes, has a script for gabapentin but hasn't started it yet. She will probably start it.  She is on Prozac.  H/O heart disease, not a good candidate for ERT.  GYNECOLOGIC HISTORY: Patient's last menstrual period was 08/14/2021 (approximate). Contraception:mirena IUD Menopausal hormone therapy: NA        OB History     Gravida  0   Para  0   Term  0   Preterm  0   AB  0   Living  0      SAB  0   IAB  0   Ectopic  0   Multiple  0   Live Births  0              Patient Active Problem List   Diagnosis Date Noted   Antibiotic-associated diarrhea 07/02/2021   Empyema lung (HCC) 07/01/2021   Medication monitoring encounter 07/01/2021   Infected prosthetic mesh of abdominal wall (HCC) 07/01/2021   Pleural effusion 05/17/2021   Sepsis due to intrathoracic abscess (HCC) 05/16/2021   Diaphragmatic hernia 02/24/2021   Vulvovaginitis 01/31/2021   Dyslipidemia 06/16/2020   Other fatigue 08/31/2017   Shortness of breath on exertion 08/31/2017   Essential hypertension 08/31/2017   Depression with anxiety 12/10/2015   Urticaria, solar 08/08/2013   CAD (coronary artery disease) 08/28/2011   Chronic low back pain 07/27/2011    Past Medical History:  Diagnosis Date   Anxiety    CAD (coronary artery disease) 07/2011   cardiologist--- dr Antoine Poche--   a. 07/2011 NSTEMI/Cath: RCA 99%m, otw nl cors, EF 50%, inf hk -> RCA stented with 3.0x63mm Promus DES   Depression    GERD (gastroesophageal reflux disease)    History of non-ST elevation myocardial infarction (NSTEMI)  07/2011   s/p PCI and stenting   History of sepsis 04/2021   hospital admission---  sepsis due to infected mesh post op diaphramatic hernia repair,  intrathoracic abscess w/ pleural effusion   Hyperlipidemia    Hypertension    Morgagni hernia    followed by dr h. littlefoot (cardiac / thoracic surgeon)  lov note in epic 10-17-2021 pt released prn//    a. Discovered incidentally on CT 07/2011  (diaphragmatic hernia);    02-24-2021  s/p repair with mesh;  hernia recurrence 04-04-2021/  02/ 2023 infected mesh w/ sepsis,  s/p unroof abscess/ pleural irrigation system placed/   re-do hernia repair 06-02-2021   Obesity    S/P drug eluting coronary stent placement 08/10/2011   DES x1  to  mRCA   Sinus bradycardia    a. Nocturnal, asymptomatic   Sun allergy    per pt dx via testing    Past Surgical History:  Procedure Laterality Date   CARDIAC CATHETERIZATION  08/10/2011   left heart, with angiogram; DES mid RCA   DIAPHRAGMATIC HERNIA REPAIR  06/02/2021   with mesh removal due to sepsis   DILATATION & CURETTAGE/HYSTEROSCOPY WITH MYOSURE N/A 11/24/2021   Procedure: DILATATION & CURETTAGE/HYSTEROSCOPY;  Surgeon: Romualdo Bolk, MD;  Location: [facility] [place];  Service: Gynecology;  Laterality:  N/A;   INTERCOSTAL NERVE BLOCK Right 05/22/2021   Procedure: INTERCOSTAL NERVE BLOCK;  Surgeon: Corliss Skains, MD;  Location: [facility] OR;  Service: Thoracic;  Laterality: Right;   INTRAUTERINE DEVICE (IUD) INSERTION N/A 11/24/2021   Procedure: Mirena INTRAUTERINE DEVICE (IUD) INSERTION;  Surgeon: Romualdo Bolk, MD;  Location: [facility] [place];  Service: Gynecology;  Laterality: N/A;   IR RADIOLOGIST EVAL & MGMT  05/05/2021   LEFT HEART CATHETERIZATION WITH CORONARY ANGIOGRAM N/A 08/10/2011   Procedure: LEFT HEART CATHETERIZATION WITH CORONARY ANGIOGRAM;  Surgeon: Kathleene Hazel, MD;  Location: [facility] CATH LAB;  Service: Cardiovascular;  Laterality: N/A;   OPERATIVE  ULTRASOUND N/A 11/24/2021   Procedure: OPERATIVE ULTRASOUND;  Surgeon: Romualdo Bolk, MD;  Location: [facility];  Service: Gynecology;  Laterality: N/A;   TONSILLECTOMY     child   XI ROBOTIC ASSISTED REPAIR OF DIAPHRAGMATIC HERNIA  02/24/2021   @[facility]   by dr ;   w/ mesh    Current Outpatient Medications  Medication Sig Dispense Refill   acetaminophen (TYLENOL) 500 MG tablet Take 500 mg by mouth every 6 (six) hours as needed for moderate pain or fever.     ALPRAZolam (XANAX) 1 MG tablet Take 0.5-1 tablets (0.5-1 mg total) by mouth 2 (two) times daily as needed for anxiety.     aspirin 81 MG tablet Take 81 mg by mouth daily.     cetirizine (ZYRTEC) 10 MG tablet Take 10 mg by mouth daily as needed for allergies.     FLUoxetine (PROZAC) 40 MG capsule Take 1 capsule by mouth once daily (Patient taking differently: Take 40 mg by mouth daily.) 90 capsule 0   gabapentin (NEURONTIN) 100 MG capsule Take one tablet po qhs, if tolerating can increase to 2 tablets po qhs after 3 days, if tolerating can increase to 3 tablets po qhs after another 3 days. 90 capsule 0   lisinopril (ZESTRIL) 10 MG tablet Take 1 tablet (10 mg total) by mouth daily. (Patient taking differently: Take 10 mg by mouth daily.) 90 tablet 3   Multiple Vitamin (MULTIVITAMIN) capsule Take 1 capsule by mouth daily.     nitroGLYCERIN (NITROSTAT) 0.4 MG SL tablet Place 1 tablet (0.4 mg total) under the tongue every 5 (five) minutes as needed for chest pain. 25 tablet 3   omeprazole (PRILOSEC) 20 MG capsule Take 1 capsule by mouth once daily (Patient taking differently: Take 20 mg by mouth daily.) 90 capsule 3   rosuvastatin (CRESTOR) 40 MG tablet Take 1 tablet (40 mg total) by mouth daily. (Patient taking differently: Take 40 mg by mouth every evening.) 30 tablet 3   No current facility-administered medications for this visit.     ALLERGIES: Wound dressing adhesive  Family History  Problem Relation Age of  Onset   Coronary artery disease Mother 35        stent   Heart attack Mother    Heart disease Mother    Hyperlipidemia Mother    Obesity Mother    Colon polyps Mother    Arrhythmia Father    Depression Father    Breast cancer Maternal Grandmother    Coronary artery disease Maternal Aunt 5       (Mother's twin sister)   Lung cancer Maternal Aunt    Heart attack Maternal Aunt        mother's twin   Colon polyps Maternal Aunt    Colon cancer Maternal Uncle    Coronary artery disease  Maternal Uncle 46   Heart attack Maternal Uncle    Breast cancer Cousin 38   Cancer Neg Hx    Early death Neg Hx    Hypertension Neg Hx    Kidney disease Neg Hx    Stroke Neg Hx    Alcohol abuse Neg Hx    Diabetes Neg Hx    Drug abuse Neg Hx     Social History   Socioeconomic History   Marital status: Significant Other    Spouse name: Not on file   Number of children: 0   Years of education: Not on file   Highest education level: Not on file  Occupational History   Occupation: Realtor  Tobacco Use   Smoking status: Former    Packs/day: 1.00    Years: 18.00    Total pack years: 18.00    Types: Cigarettes    Quit date: 08/09/2011    Years since quitting: 10.3   Smokeless tobacco: Never   Tobacco comments:    2013  Vaping Use   Vaping Use: Never used  Substance and Sexual Activity   Alcohol use: Not Currently    Comment: occasionally   Drug use: Never   Sexual activity: Yes    Birth control/protection: None  Other Topics Concern   Not on file  Social History Narrative   Lives alone.  Occasional EtOH.  Works as a Veterinary surgeon.   Social Determinants of Health   Financial Resource Strain: Not on file  Food Insecurity: Not on file  Transportation Needs: Not on file  Physical Activity: Not on file  Stress: Not on file  Social Connections: Not on file  Intimate Partner Violence: Not on file    ROS  PHYSICAL EXAMINATION:    LMP 08/14/2021 (Approximate)     General appearance:  alert, cooperative and appears stated age Abdomen: soft, non-tender; non distended, no masses,  no organomegaly  Pelvic: External genitalia:  no lesions              Urethra:  normal appearing urethra with no masses, tenderness or lesions              Bartholins and Skenes: normal                 Vagina: normal appearing vagina with normal color and discharge, no lesions              Cervix: no lesions and IUD strings 3-4 cm              Bimanual Exam:  Uterus:   no masses or tenderness              Adnexa: no mass, fullness, tenderness               Chaperone was present for exam.  1. History of hysteroscopy Benign pathology  2. IUD check up  Mild cramping, light bleeding. Overall doing fine Routine f/u  3. Perimenopausal vasomotor symptoms She is on prozac, not a candidate for ERT, has a script for gabapentin to start when she is ready.

## 2021-12-04 ENCOUNTER — Other Ambulatory Visit: Payer: Self-pay | Admitting: Internal Medicine

## 2021-12-07 ENCOUNTER — Other Ambulatory Visit: Payer: Self-pay | Admitting: Cardiology

## 2022-01-02 ENCOUNTER — Other Ambulatory Visit: Payer: Self-pay | Admitting: Cardiology

## 2022-01-08 ENCOUNTER — Telehealth: Payer: Self-pay

## 2022-01-08 NOTE — Telephone Encounter (Signed)
Called the patient to offer 11/30 or 12/21 for her screening colonoscopy. No answer. Left the information on the voicemail and asked for a return call.

## 2022-01-14 NOTE — Telephone Encounter (Signed)
Patient returned your call, please advise. 

## 2022-01-15 ENCOUNTER — Other Ambulatory Visit: Payer: Self-pay

## 2022-01-15 DIAGNOSIS — Z1211 Encounter for screening for malignant neoplasm of colon: Secondary | ICD-10-CM

## 2022-01-15 DIAGNOSIS — Z1231 Encounter for screening mammogram for malignant neoplasm of breast: Secondary | ICD-10-CM | POA: Diagnosis not present

## 2022-01-15 LAB — HM MAMMOGRAPHY

## 2022-01-15 MED ORDER — GOLYTELY 236 G PO SOLR
ORAL | 0 refills | Status: DC
Start: 2022-01-15 — End: 2022-03-21

## 2022-01-15 NOTE — Telephone Encounter (Signed)
Spoke with the patient. She agrees to have her colonoscopy on 02/26/22. Referral updated. Instructions through My Chart. She will call with any questions of concerns. Colonoscopy 02/26/22 arrive at 8:00 am for a 9:30 am appointment. Go-lytely Rx sent to her pharmacy.

## 2022-01-19 ENCOUNTER — Encounter: Payer: Self-pay | Admitting: Internal Medicine

## 2022-01-20 NOTE — Progress Notes (Unsigned)
Cardiology Office Note   Date:  01/22/2022   ID:  Rhonda Colon, DOB 03/05/1976, MRN [id number]  PCP:  Margaree Mackintosh, MD  Cardiologist:   Rollene Rotunda, MD    Chief Complaint  Patient presents with   Shortness of Breath     History of Present Illness: Rhonda Colon is a 46 y.o. female who presents for followup after a previous MI . Since I last saw her she has had lots of with shortness of breath.  She is status post repair of Morgagni hernia.  She had a mesh in November 22.  She had a large fluid collection drained percutaneously.  She had robot-assisted right video thoracoscopy and unroofing of abscess cavity.  She was treated for sepsis.   She has continued to have SOB.  At the last visit BNP was negative.  She has had no increased shortness of breath since I saw her.  She is stressed because her mom has not been feeling well and her recently diagnosed brother with coronary disease and he needs bypass.  She has been doing some walking for exercise.  She is less short of breath probably with this.  She is not having any new chest pressure, neck or arm discomfort.  She is having no new weight gain or edema.  She just cannot lose weight.    Past Medical History:  Diagnosis Date   Anxiety    CAD (coronary artery disease) 07/2011   cardiologist--- dr Antoine Poche--   a. 07/2011 NSTEMI/Cath: RCA 99%m, otw nl cors, EF 50%, inf hk -> RCA stented with 3.0x67mm Promus DES   Depression    GERD (gastroesophageal reflux disease)    History of non-ST elevation myocardial infarction (NSTEMI) 07/2011   s/p PCI and stenting   History of sepsis 04/2021   hospital admission---  sepsis due to infected mesh post op diaphramatic hernia repair,  intrathoracic abscess w/ pleural effusion   Hyperlipidemia    Hypertension    Morgagni hernia    followed by dr h. littlefoot (cardiac / thoracic surgeon)  lov note in epic 10-17-2021 pt released prn//    a. Discovered incidentally on CT 07/2011   (diaphragmatic hernia);    02-24-2021  s/p repair with mesh;  hernia recurrence 04-04-2021/  02/ 2023 infected mesh w/ sepsis,  s/p unroof abscess/ pleural irrigation system placed/   re-do hernia repair 06-02-2021   Obesity    S/P drug eluting coronary stent placement 08/10/2011   DES x1  to  mRCA   Sinus bradycardia    a. Nocturnal, asymptomatic   Sun allergy    per pt dx via testing    Past Surgical History:  Procedure Laterality Date   CARDIAC CATHETERIZATION  08/10/2011   left heart, with angiogram; DES mid RCA   DIAPHRAGMATIC HERNIA REPAIR  06/02/2021   with mesh removal due to sepsis   DILATATION & CURETTAGE/HYSTEROSCOPY WITH MYOSURE N/A 11/24/2021   Procedure: DILATATION & CURETTAGE/HYSTEROSCOPY;  Surgeon: Romualdo Bolk, MD;  Location: [facility] [place];  Service: Gynecology;  Laterality: N/A;   INTERCOSTAL NERVE BLOCK Right 05/22/2021   Procedure: INTERCOSTAL NERVE BLOCK;  Surgeon: Corliss Skains, MD;  Location: [facility] OR;  Service: Thoracic;  Laterality: Right;   INTRAUTERINE DEVICE (IUD) INSERTION N/A 11/24/2021   Procedure: Mirena INTRAUTERINE DEVICE (IUD) INSERTION;  Surgeon: Romualdo Bolk, MD;  Location: [facility] [place];  Service: Gynecology;  Laterality: N/A;   IR RADIOLOGIST EVAL & MGMT  05/05/2021   LEFT HEART CATHETERIZATION WITH CORONARY ANGIOGRAM N/A 08/10/2011   Procedure: LEFT HEART CATHETERIZATION WITH CORONARY ANGIOGRAM;  Surgeon: Kathleene Hazel, MD;  Location: [facility] CATH LAB;  Service: Cardiovascular;  Laterality: N/A;   OPERATIVE ULTRASOUND N/A 11/24/2021   Procedure: OPERATIVE ULTRASOUND;  Surgeon: Romualdo Bolk, MD;  Location: [facility];  Service: Gynecology;  Laterality: N/A;   TONSILLECTOMY     child   XI ROBOTIC ASSISTED REPAIR OF DIAPHRAGMATIC HERNIA  02/24/2021   @[facility]   by dr ;   w/ mesh     Current Outpatient Medications  Medication Sig Dispense Refill   acetaminophen (TYLENOL)  500 MG tablet Take 500 mg by mouth every 6 (six) hours as needed for moderate pain or fever.     ALPRAZolam (XANAX) 1 MG tablet Take 0.5-1 tablets (0.5-1 mg total) by mouth 2 (two) times daily as needed for anxiety.     aspirin 81 MG tablet Take 81 mg by mouth daily.     cetirizine (ZYRTEC) 10 MG tablet Take 10 mg by mouth daily as needed for allergies.     FLUoxetine (PROZAC) 40 MG capsule Take 1 capsule by mouth once daily 90 capsule 0   gabapentin (NEURONTIN) 100 MG capsule Take one tablet po qhs, if tolerating can increase to 2 tablets po qhs after 3 days, if tolerating can increase to 3 tablets po qhs after another 3 days. 90 capsule 0   levonorgestrel (MIRENA) 20 MCG/DAY IUD 1 each by Intrauterine route once.     lisinopril (ZESTRIL) 10 MG tablet Take 1 tablet (10 mg total) by mouth daily. (Patient taking differently: Take 10 mg by mouth daily.) 90 tablet 3   Multiple Vitamin (MULTIVITAMIN) capsule Take 1 capsule by mouth daily.     nitroGLYCERIN (NITROSTAT) 0.4 MG SL tablet Place 1 tablet (0.4 mg total) under the tongue every 5 (five) minutes as needed for chest pain. 25 tablet 3   omeprazole (PRILOSEC) 20 MG capsule Take 1 capsule by mouth once daily (Patient taking differently: Take 20 mg by mouth daily.) 90 capsule 3   polyethylene glycol (GOLYTELY) 236 g solution Take as a split dose prep following doctor instructions 4000 mL 0   rosuvastatin (CRESTOR) 40 MG tablet Take 1 tablet by mouth once daily 30 tablet 0   No current facility-administered medications for this visit.    Allergies:   Wound dressing adhesive    ROS:  Please see the history of present illness.   Otherwise, review of systems are positive for none.   All other systems are reviewed and negative.    PHYSICAL EXAM: VS:  BP 134/88   Pulse (!) 109   Ht 5\' 4"  (1.626 m)   Wt (!) 324 lb 3.2 oz (147.1 kg)   SpO2 94%   BMI 55.65 kg/m  , BMI Body mass index is 55.65 kg/m. GENERAL:  Well appearing NECK:  No jugular  venous distention, waveform within normal limits, carotid upstroke brisk and symmetric, no bruits, no thyromegaly LUNGS:  Clear to auscultation bilaterally CHEST:  Unremarkable HEART:  PMI not displaced or sustained,S1 and S2 within normal limits, no S3, no S4, no clicks, no rubs, no murmurs ABD:  Flat, positive bowel sounds normal in frequency in pitch, no bruits, no rebound, no guarding, no midline pulsatile mass, no hepatomegaly, no splenomegaly EXT:  2 plus pulses throughout, no edema, no cyanosis no clubbing   EKG:  EKG is  ordered today. The ekg ordered  today demonstrates normal sinus rhythm, rate 109, axis is normal limits, intervals within normal limits, no acute ST-T wave changes.   Recent Labs: 06/02/2021: Magnesium 2.0 08/08/2021: ALT 24; Brain Natriuretic Peptide 29; Platelets 313; TSH 1.88 11/24/2021: BUN 11; Creatinine, Ser 0.60; Hemoglobin 15.6; Potassium 4.5; Sodium 142    Lipid Panel    Component Value Date/Time   CHOL 143 08/08/2021 0933   CHOL 186 11/04/2020 1110   TRIG 76 08/08/2021 0933   HDL 56 08/08/2021 0933   HDL 55 11/04/2020 1110   CHOLHDL 2.6 08/08/2021 0933   VLDL 9 06/14/2014 0907   LDLCALC 71 08/08/2021 0933      Wt Readings from Last 3 Encounters:  01/22/22 (!) 324 lb 3.2 oz (147.1 kg)  12/02/21 (!) 311 lb (141.1 kg)  11/24/21 (!) 314 lb (142.4 kg)      Other studies Reviewed: Additional studies/ records that were reviewed today include: Chest x-ray is   ASSESSMENT AND PLAN:  CAD:     The patient has no new sypmtoms.  No further cardiovascular testing is indicated.  We will continue with aggressive risk reduction and meds as listed.   HL:     Her LDL was 71 and HDL 56 when I switched her to Crestor recently.  She will continue the meds as listed.   TACHYCARDIA: She thinks this is elevated because she is anxious.  She can keep an eye on her heart rate.  SOB: She had a normal BNP.  Her breathing is stable or little bit better.  I reviewed  the chest x-rays most recently and she has a small loculated right-sided effusion but it was stable over serial exams.  I do not think further imaging is indicated.  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   OBESITY: I am going to look into getting her started on Wegovy.    Current medicines are reviewed at length with the patient today.  The patient does not have concerns regarding medicines.  The following changes have been made:  None  Labs/ tests ordered today include:     Orders Placed This Encounter  Procedures   EKG 12-Lead     Disposition:   FU with me in 4 months.     Signed, Minus Breeding, MD  01/22/2022 9:53 AM    [facility]

## 2022-01-22 ENCOUNTER — Ambulatory Visit: Payer: 59 | Attending: Cardiology | Admitting: Cardiology

## 2022-01-22 ENCOUNTER — Encounter: Payer: Self-pay | Admitting: Cardiology

## 2022-01-22 ENCOUNTER — Encounter (HOSPITAL_BASED_OUTPATIENT_CLINIC_OR_DEPARTMENT_OTHER): Payer: Self-pay

## 2022-01-22 VITALS — BP 134/88 | HR 109 | Ht 64.0 in | Wt 324.2 lb

## 2022-01-22 DIAGNOSIS — E785 Hyperlipidemia, unspecified: Secondary | ICD-10-CM | POA: Diagnosis not present

## 2022-01-22 DIAGNOSIS — Z6841 Body Mass Index (BMI) 40.0 and over, adult: Secondary | ICD-10-CM

## 2022-01-22 DIAGNOSIS — R0602 Shortness of breath: Secondary | ICD-10-CM

## 2022-01-22 DIAGNOSIS — I2510 Atherosclerotic heart disease of native coronary artery without angina pectoris: Secondary | ICD-10-CM

## 2022-01-22 NOTE — Patient Instructions (Signed)
Medication Instructions:  Your physician recommends that you continue on your current medications as directed. Please refer to the Current Medication list given to you today.  *If you need a refill on your cardiac medications before your next appointment, please call your pharmacy*  Follow-Up: At [facility], you and your health needs are our priority.  As part of our continuing mission to provide you with exceptional heart care, we have created designated Provider Care Teams.  These Care Teams include your primary Cardiologist (physician) and Advanced Practice Providers (APPs -  Physician Assistants and Nurse Practitioners) who all work together to provide you with the care you need, when you need it.  We recommend signing up for the patient portal called "MyChart".  Sign up information is provided on this After Visit Summary.  MyChart is used to connect with patients for Virtual Visits (Telemedicine).  Patients are able to view lab/test results, encounter notes, upcoming appointments, etc.  Non-urgent messages can be sent to your provider as well.   To learn more about what you can do with MyChart, go to [email or web address].    Your next appointment:   4 month(s)  The format for your next appointment:   In Person  Provider:   James Hochrein, MD          

## 2022-02-03 ENCOUNTER — Other Ambulatory Visit: Payer: Self-pay | Admitting: Cardiology

## 2022-02-11 ENCOUNTER — Encounter: Payer: Self-pay | Admitting: Obstetrics and Gynecology

## 2022-02-15 NOTE — Addendum Note (Signed)
Addended by: Alver Sorrow on: 02/15/2022 09:04 PM   Modules accepted: Orders

## 2022-02-16 ENCOUNTER — Ambulatory Visit
Admission: EM | Admit: 2022-02-16 | Discharge: 2022-02-16 | Disposition: A | Discharge status: Home / Self Care | Payer: 59 | Attending: Urgent Care | Admitting: Urgent Care

## 2022-02-16 ENCOUNTER — Other Ambulatory Visit: Payer: Self-pay

## 2022-02-16 ENCOUNTER — Ambulatory Visit (INDEPENDENT_AMBULATORY_CARE_PROVIDER_SITE_OTHER): Payer: 59

## 2022-02-16 ENCOUNTER — Encounter (HOSPITAL_COMMUNITY): Payer: Self-pay | Admitting: Internal Medicine

## 2022-02-16 DIAGNOSIS — M25571 Pain in right ankle and joints of right foot: Secondary | ICD-10-CM

## 2022-02-16 DIAGNOSIS — S40011A Contusion of right shoulder, initial encounter: Secondary | ICD-10-CM

## 2022-02-16 DIAGNOSIS — M25511 Pain in right shoulder: Secondary | ICD-10-CM

## 2022-02-16 DIAGNOSIS — W19XXXA Unspecified fall, initial encounter: Secondary | ICD-10-CM

## 2022-02-16 MED ORDER — ACETAMINOPHEN 325 MG PO TABS: 650.0000 mg | ORAL_TABLET | Freq: Four times a day (QID) | ORAL | 0 refills | Status: DC | PRN

## 2022-02-16 MED ORDER — TIZANIDINE HCL 4 MG PO TABS: 4.0000 mg | ORAL_TABLET | Freq: Every day | ORAL | 0 refills | Status: DC

## 2022-02-16 MED ORDER — PREDNISONE 20 MG PO TABS: ORAL_TABLET | ORAL | 0 refills | Status: DC

## 2022-02-16 NOTE — ED Triage Notes (Signed)
Pt states she slid/fell on porch 11/16-pain to right ankle and right shoulder-NAD-steady gait

## 2022-02-16 NOTE — ED Provider Notes (Signed)
[facility]  Note:  This document was prepared using Conservation officer, historic buildings and may include unintentional dictation errors.  MRN: [id number] DOB: [birth year]  Subjective:   Rhonda Colon is a 46 y.o. female presenting for 4-day history of acute onset persistent right shoulder pain, decreased range of motion, right ankle pain and swelling.  Patient suffered an accidental fall coming off of her porch.  She has since been nursing her injuries but wanted to make sure that we got imaging.  Has a history of frequent ankle injuries to the same ankle in question today.  Has a history of heart disease.  Generally takes Tylenol for aches and pains.  No current facility-administered medications for this encounter.  Current Outpatient Medications:    acetaminophen (TYLENOL) 500 MG tablet, Take 500 mg by mouth every 6 (six) hours as needed for moderate pain or fever., Disp: , Rfl:    ALPRAZolam (XANAX) 1 MG tablet, Take 0.5-1 tablets (0.5-1 mg total) by mouth 2 (two) times daily as needed for anxiety., Disp: , Rfl:    aspirin 81 MG tablet, Take 81 mg by mouth daily., Disp: , Rfl:    cetirizine (ZYRTEC) 10 MG tablet, Take 10 mg by mouth daily as needed for allergies., Disp: , Rfl:    FLUoxetine (PROZAC) 40 MG capsule, Take 1 capsule by mouth once daily, Disp: 90 capsule, Rfl: 0   gabapentin (NEURONTIN) 100 MG capsule, Take one tablet po qhs, if tolerating can increase to 2 tablets po qhs after 3 days, if tolerating can increase to 3 tablets po qhs after another 3 days., Disp: 90 capsule, Rfl: 0   levonorgestrel (MIRENA) 20 MCG/DAY IUD, 1 each by Intrauterine route once., Disp: , Rfl:    lisinopril (ZESTRIL) 10 MG tablet, Take 1 tablet (10 mg total) by mouth daily. (Patient taking differently: Take 10 mg by mouth daily.), Disp: 90 tablet, Rfl: 3   Multiple Vitamin (MULTIVITAMIN) capsule, Take 1 capsule by mouth daily., Disp: , Rfl:    nitroGLYCERIN (NITROSTAT) 0.4 MG SL  tablet, Place 1 tablet (0.4 mg total) under the tongue every 5 (five) minutes as needed for chest pain., Disp: 25 tablet, Rfl: 3   omeprazole (PRILOSEC) 20 MG capsule, Take 1 capsule by mouth once daily (Patient taking differently: Take 20 mg by mouth daily.), Disp: 90 capsule, Rfl: 3   polyethylene glycol (GOLYTELY) 236 g solution, Take as a split dose prep following doctor instructions, Disp: 4000 mL, Rfl: 0   rosuvastatin (CRESTOR) 40 MG tablet, Take 1 tablet by mouth once daily, Disp: 90 tablet, Rfl: 3   Allergies  Allergen Reactions   Wound Dressing Adhesive Dermatitis and Rash    Past Medical History:  Diagnosis Date   Anxiety    CAD (coronary artery disease) 07/2011   cardiologist--- dr Antoine Poche--   a. 07/2011 NSTEMI/Cath: RCA 99%m, otw nl cors, EF 50%, inf hk -> RCA stented with 3.0x70mm Promus DES   Depression    GERD (gastroesophageal reflux disease)    History of non-ST elevation myocardial infarction (NSTEMI) 07/2011   s/p PCI and stenting   History of sepsis 04/2021   hospital admission---  sepsis due to infected mesh post op diaphramatic hernia repair,  intrathoracic abscess w/ pleural effusion   Hyperlipidemia    Hypertension    Morgagni hernia    followed by dr h. littlefoot (cardiac / thoracic surgeon)  lov note in epic 10-17-2021 pt released prn//    a. Discovered  incidentally on CT 07/2011  (diaphragmatic hernia);    02-24-2021  s/p repair with mesh;  hernia recurrence 04-04-2021/  02/ 2023 infected mesh w/ sepsis,  s/p unroof abscess/ pleural irrigation system placed/   re-do hernia repair 06-02-2021   Obesity    S/P drug eluting coronary stent placement 08/10/2011   DES x1  to  mRCA   Sinus bradycardia    a. Nocturnal, asymptomatic   Sun allergy    per pt dx via testing     Past Surgical History:  Procedure Laterality Date   CARDIAC CATHETERIZATION  08/10/2011   left heart, with angiogram; DES mid RCA   DIAPHRAGMATIC HERNIA REPAIR  06/02/2021   with mesh  removal due to sepsis   DILATATION & CURETTAGE/HYSTEROSCOPY WITH MYOSURE N/A 11/24/2021   Procedure: DILATATION & CURETTAGE/HYSTEROSCOPY;  Surgeon: Romualdo BolkJertson, Jill Evelyn, MD;  [facility] [place];  Service: Gynecology;  Laterality: N/A;   INTERCOSTAL NERVE BLOCK Right 05/22/2021   Procedure: INTERCOSTAL NERVE BLOCK;  Surgeon: Corliss SkainsLightfoot, Harrell O, MD;  Location: [facility] OR;  Service: Thoracic;  Laterality: Right;   INTRAUTERINE DEVICE (IUD) INSERTION N/A 11/24/2021   Procedure: Mirena INTRAUTERINE DEVICE (IUD) INSERTION;  Surgeon: Romualdo BolkJertson, Jill Evelyn, MD;  [facility] [place];  Service: Gynecology;  Laterality: N/A;   IR RADIOLOGIST EVAL & MGMT  05/05/2021   LEFT HEART CATHETERIZATION WITH CORONARY ANGIOGRAM N/A 08/10/2011   Procedure: LEFT HEART CATHETERIZATION WITH CORONARY ANGIOGRAM;  Surgeon: Kathleene Hazelhristopher D McAlhany, MD;  [facility];  Service: Cardiovascular;  Laterality: N/A;   OPERATIVE ULTRASOUND N/A 11/24/2021   Procedure: OPERATIVE ULTRASOUND;  Surgeon: Romualdo BolkJertson, Jill Evelyn, MD;  [facility] [place];  Service: Gynecology;  Laterality: N/A;   TONSILLECTOMY     child   XI ROBOTIC ASSISTED REPAIR OF DIAPHRAGMATIC HERNIA  02/24/2021   @[facility]   by dr Irving Copaslighfoot;   w/ mesh    Family History  Problem Relation Age of Onset   Coronary artery disease Mother 2952        stent   Heart attack Mother    Heart disease Mother    Hyperlipidemia Mother    Obesity Mother    Colon polyps Mother    Arrhythmia Father    Depression Father    Breast cancer Maternal Grandmother    Coronary artery disease Maternal Aunt 1061       (Mother's twin sister)   Lung cancer Maternal Aunt    Heart attack Maternal Aunt        mother's twin   Colon polyps Maternal Aunt    Colon cancer Maternal Uncle    Coronary artery disease Maternal Uncle 46   Heart attack Maternal Uncle    Breast cancer Cousin 38   Cancer Neg Hx    Early death Neg Hx    Hypertension Neg Hx     Kidney disease Neg Hx    Stroke Neg Hx    Alcohol abuse Neg Hx    Diabetes Neg Hx    Drug abuse Neg Hx     Social History   Tobacco Use   Smoking status: Former    Packs/day: 1.00    Years: 18.00    Total pack years: 18.00    Types: Cigarettes    Quit date: 08/09/2011    Years since quitting: 10.5   Smokeless tobacco: Never   Tobacco comments:    2013  Vaping Use   Vaping Use: Never used  Substance Use Topics  Alcohol use: Yes    Comment: occasionally   Drug use: Never    ROS   Objective:   Vitals: BP 136/85 (BP Location: Left Arm)   Pulse 91   Temp 98.1 F (36.7 C) (Oral)   Resp 20   SpO2 96%   Physical Exam Constitutional:      General: She is not in acute distress.    Appearance: Normal appearance. She is well-developed. She is not ill-appearing, toxic-appearing or diaphoretic.  HENT:     Head: Normocephalic and atraumatic.     Nose: Nose normal.     Mouth/Throat:     Mouth: Mucous membranes are moist.  Eyes:     General: No scleral icterus.       Right eye: No discharge.        Left eye: No discharge.     Extraocular Movements: Extraocular movements intact.  Cardiovascular:     Rate and Rhythm: Normal rate.  Pulmonary:     Effort: Pulmonary effort is normal.  Musculoskeletal:     Right shoulder: Tenderness present. No swelling, deformity, effusion, laceration, bony tenderness or crepitus. Decreased range of motion. Normal strength.     Right ankle: Swelling present. No deformity, ecchymosis or lacerations. Tenderness present over the lateral malleolus. No medial malleolus, ATF ligament, AITF ligament, CF ligament, posterior TF ligament or base of 5th metatarsal tenderness. Normal range of motion.     Right Achilles Tendon: No tenderness or defects. Thompson's test negative.  Skin:    General: Skin is warm and dry.  Neurological:     General: No focal deficit present.     Mental Status: She is alert and oriented to person, place, and time.   Psychiatric:        Mood and Affect: Mood normal.        Behavior: Behavior normal.     DG Shoulder Right  Result Date: 02/16/2022 CLINICAL DATA:  Fall, shoulder pain EXAM: RIGHT SHOULDER - 2+ VIEW COMPARISON:  None Available. FINDINGS: There is no acute fracture or dislocation. Glenohumeral and acromioclavicular alignment is normal. The joint spaces are maintained. There is no erosive change. The soft tissues are unremarkable. IMPRESSION: No acute fracture or dislocation. Electronically Signed   By: Lesia Hausen M.D.   On: 02/16/2022 10:51   DG Ankle Complete Right  Result Date: 02/16/2022 CLINICAL DATA:  Fall, ankle pain EXAM: RIGHT ANKLE - COMPLETE 3+ VIEW COMPARISON:  None Available. FINDINGS: There is no acute fracture or dislocation. Ankle alignment is normal. The ankle mortise is intact. There is no erosive change. There is bimalleolar soft tissue swelling. There is no soft tissue gas or radiopaque foreign body. IMPRESSION: Soft tissue swelling with no acute osseous abnormality. Electronically Signed   By: Lesia Hausen M.D.   On: 02/16/2022 10:44    Right ankle wrapped using 4" Ace wrap in figure-8 method.   Assessment and Plan :   PDMP not reviewed this encounter.  1. Acute pain of right shoulder   2. Acute right ankle pain   3. Contusion of right shoulder, initial encounter     Recommended conservative management for contusion of the right shoulder, right ankle.  Use Tylenol and tizanidine.  Use general RICE method.  Should the patient experience no relief with her symptoms, offered an oral prednisone course for anti-inflammatory properties especially because of her history of heart disease.  Counseled patient on potential for adverse effects with medications prescribed/recommended today, ER and return-to-clinic precautions discussed, patient  verbalized understanding.    Wallis Bamberg, PA-C 02/16/22 1218

## 2022-02-23 ENCOUNTER — Encounter: Payer: Self-pay | Admitting: Internal Medicine

## 2022-02-26 ENCOUNTER — Telehealth: Payer: Self-pay | Admitting: Internal Medicine

## 2022-02-26 ENCOUNTER — Ambulatory Visit (HOSPITAL_BASED_OUTPATIENT_CLINIC_OR_DEPARTMENT_OTHER): Payer: 59 | Admitting: Certified Registered Nurse Anesthetist

## 2022-02-26 ENCOUNTER — Other Ambulatory Visit: Payer: Self-pay

## 2022-02-26 ENCOUNTER — Encounter (HOSPITAL_COMMUNITY): Payer: Self-pay | Admitting: Internal Medicine

## 2022-02-26 ENCOUNTER — Encounter (HOSPITAL_COMMUNITY)
Admission: RE | Disposition: A | Discharge status: Home / Self Care | Payer: Self-pay | Source: Home / Self Care | Attending: Internal Medicine

## 2022-02-26 ENCOUNTER — Ambulatory Visit (HOSPITAL_COMMUNITY)
Admission: RE | Admit: 2022-02-26 | Discharge: 2022-02-26 | Disposition: A | Discharge status: Home / Self Care | Payer: 59 | Attending: Internal Medicine | Admitting: Internal Medicine

## 2022-02-26 ENCOUNTER — Ambulatory Visit (HOSPITAL_COMMUNITY): Payer: 59 | Admitting: Certified Registered Nurse Anesthetist

## 2022-02-26 DIAGNOSIS — I10 Essential (primary) hypertension: Secondary | ICD-10-CM

## 2022-02-26 DIAGNOSIS — Z1212 Encounter for screening for malignant neoplasm of rectum: Secondary | ICD-10-CM

## 2022-02-26 DIAGNOSIS — K5730 Diverticulosis of large intestine without perforation or abscess without bleeding: Secondary | ICD-10-CM

## 2022-02-26 DIAGNOSIS — I252 Old myocardial infarction: Secondary | ICD-10-CM

## 2022-02-26 DIAGNOSIS — Z87891 Personal history of nicotine dependence: Secondary | ICD-10-CM | POA: Diagnosis not present

## 2022-02-26 DIAGNOSIS — R69 Illness, unspecified: Secondary | ICD-10-CM | POA: Diagnosis not present

## 2022-02-26 DIAGNOSIS — Z1211 Encounter for screening for malignant neoplasm of colon: Secondary | ICD-10-CM | POA: Insufficient documentation

## 2022-02-26 DIAGNOSIS — F32A Depression, unspecified: Secondary | ICD-10-CM | POA: Insufficient documentation

## 2022-02-26 DIAGNOSIS — K219 Gastro-esophageal reflux disease without esophagitis: Secondary | ICD-10-CM | POA: Insufficient documentation

## 2022-02-26 DIAGNOSIS — D123 Benign neoplasm of transverse colon: Secondary | ICD-10-CM | POA: Diagnosis not present

## 2022-02-26 DIAGNOSIS — K635 Polyp of colon: Secondary | ICD-10-CM

## 2022-02-26 DIAGNOSIS — I2510 Atherosclerotic heart disease of native coronary artery without angina pectoris: Secondary | ICD-10-CM | POA: Insufficient documentation

## 2022-02-26 DIAGNOSIS — F419 Anxiety disorder, unspecified: Secondary | ICD-10-CM | POA: Diagnosis not present

## 2022-02-26 DIAGNOSIS — K648 Other hemorrhoids: Secondary | ICD-10-CM | POA: Diagnosis not present

## 2022-02-26 DIAGNOSIS — Z79899 Other long term (current) drug therapy: Secondary | ICD-10-CM | POA: Insufficient documentation

## 2022-02-26 DIAGNOSIS — Z6841 Body Mass Index (BMI) 40.0 and over, adult: Secondary | ICD-10-CM | POA: Diagnosis not present

## 2022-02-26 DIAGNOSIS — D124 Benign neoplasm of descending colon: Secondary | ICD-10-CM | POA: Diagnosis not present

## 2022-02-26 HISTORY — PX: COLONOSCOPY WITH PROPOFOL: SHX5780

## 2022-02-26 HISTORY — PX: POLYPECTOMY: SHX5525

## 2022-02-26 HISTORY — PX: HEMOSTASIS CLIP PLACEMENT: SHX6857

## 2022-02-26 LAB — PREGNANCY, URINE: Preg Test, Ur: NEGATIVE

## 2022-02-26 SURGERY — COLONOSCOPY WITH PROPOFOL
Anesthesia: Monitor Anesthesia Care

## 2022-02-26 MED ORDER — PROPOFOL 1000 MG/100ML IV EMUL
INTRAVENOUS | Status: AC
  Filled 2022-02-26: qty 100

## 2022-02-26 MED ORDER — PROPOFOL 500 MG/50ML IV EMUL
INTRAVENOUS | Status: DC | PRN
  Administered 2022-02-26: 125 ug/kg/min via INTRAVENOUS

## 2022-02-26 MED ORDER — PROPOFOL 10 MG/ML IV BOLUS
INTRAVENOUS | Status: DC | PRN
  Administered 2022-02-26: 20 mg via INTRAVENOUS
  Administered 2022-02-26: 30 mg via INTRAVENOUS

## 2022-02-26 MED ORDER — ONDANSETRON HCL 4 MG/2ML IJ SOLN
INTRAMUSCULAR | Status: DC | PRN
  Administered 2022-02-26: 4 mg via INTRAVENOUS

## 2022-02-26 MED ORDER — HYDROCORTISONE (PERIANAL) 2.5 % EX CREA: 1.0000 | TOPICAL_CREAM | Freq: Two times a day (BID) | CUTANEOUS | 0 refills | Status: AC

## 2022-02-26 MED ORDER — LIDOCAINE 2% (20 MG/ML) 5 ML SYRINGE
INTRAMUSCULAR | Status: DC | PRN
  Administered 2022-02-26: 100 mg via INTRAVENOUS

## 2022-02-26 MED ORDER — SODIUM CHLORIDE 0.9 % IV SOLN: INTRAVENOUS | Status: DC

## 2022-02-26 SURGICAL SUPPLY — 22 items

## 2022-02-26 NOTE — Anesthesia Preprocedure Evaluation (Addendum)
Anesthesia Evaluation  Patient identified by MRN, date of birth, ID band Patient awake    Reviewed: Allergy & Precautions, NPO status , Patient's Chart, lab work & pertinent test results  Airway Mallampati: III  TM Distance: >3 FB Neck ROM: Full    Dental no notable dental hx. (+) Teeth Intact, Dental Advisory Given   Pulmonary former smoker   Pulmonary exam normal breath sounds clear to auscultation       Cardiovascular hypertension, Pt. on medications + CAD, + Past MI and + Cardiac Stents (DES to RCA on 08/10/11)  Normal cardiovascular exam Rhythm:Regular Rate:Normal     Neuro/Psych  PSYCHIATRIC DISORDERS Anxiety Depression    negative neurological ROS     GI/Hepatic Neg liver ROS,GERD  Medicated,,  Endo/Other    Morbid obesity (BMI 53)  Renal/GU negative Renal ROS     Musculoskeletal negative musculoskeletal ROS (+)    Abdominal   Peds  Hematology negative hematology ROS (+)   Anesthesia Other Findings   Reproductive/Obstetrics                             Anesthesia Physical Anesthesia Plan  ASA: 3  Anesthesia Plan: MAC   Post-op Pain Management: Minimal or no pain anticipated   Induction: Intravenous  PONV Risk Score and Plan: 2 and Propofol infusion and Treatment may vary due to age or medical condition  Airway Management Planned: Natural Airway and Nasal Cannula  Additional Equipment: None  Intra-op Plan:   Post-operative Plan:   Informed Consent:   Plan Discussed with: CRNA and Anesthesiologist  Anesthesia Plan Comments:         Anesthesia Quick Evaluation

## 2022-02-26 NOTE — Discharge Instructions (Signed)
YOU HAD AN ENDOSCOPIC PROCEDURE TODAY: Refer to the procedure report and other information in the discharge instructions given to you for any specific questions about what was found during the examination. If this information does not answer your questions, please call [place] office at [phone] to clarify.  ° °YOU SHOULD EXPECT: Some feelings of bloating in the abdomen. Passage of more gas than usual. Walking can help get rid of the air that was put into your GI tract during the procedure and reduce the bloating. If you had a lower endoscopy (such as a colonoscopy or flexible sigmoidoscopy) you may notice spotting of blood in your stool or on the toilet paper. Some abdominal soreness may be present for a day or two, also. ° °DIET: Your first meal following the procedure should be a light meal and then it is ok to progress to your normal diet. A half-sandwich or bowl of soup is an example of a good first meal. Heavy or fried foods are harder to digest and may make you feel nauseous or bloated. Drink plenty of fluids but you should avoid alcoholic beverages for 24 hours. If you had a esophageal dilation, please see attached instructions for diet.   ° °ACTIVITY: Your care partner should take you home directly after the procedure. You should plan to take it easy, moving slowly for the rest of the day. You can resume normal activity the day after the procedure however YOU SHOULD NOT DRIVE, use power tools, machinery or perform tasks that involve climbing or major physical exertion for 24 hours (because of the sedation medicines used during the test).  ° °SYMPTOMS TO REPORT IMMEDIATELY: °A gastroenterologist can be reached at any hour. Please call [phone]  for any of the following symptoms:  °Following lower endoscopy (colonoscopy, flexible sigmoidoscopy) °Excessive amounts of blood in the stool  °Significant tenderness, worsening of abdominal pains  °Swelling of the abdomen that is new, acute  °Fever of 100° or  higher  °Following upper endoscopy (EGD, EUS, ERCP, esophageal dilation) °Vomiting of blood or coffee ground material  °New, significant abdominal pain  °New, significant chest pain or pain under the shoulder blades  °Painful or persistently difficult swallowing  °New shortness of breath  °Black, tarry-looking or red, bloody stools ° °FOLLOW UP:  °If any biopsies were taken you will be contacted by phone or by letter within the next 1-3 weeks. Call [phone]  if you have not heard about the biopsies in 3 weeks.  °Please also call with any specific questions about appointments or follow up tests. ° °

## 2022-02-26 NOTE — H&P (Signed)
GASTROENTEROLOGY PROCEDURE H&P NOTE   Primary Care Physician: Margaree Mackintosh, MD    Reason for Procedure:   Colon cancer screening  Plan:    Colonoscopy  Patient is appropriate for endoscopic procedure(s) in the hospital setting.  The nature of the procedure, as well as the risks, benefits, and alternatives were carefully and thoroughly reviewed with the patient. Ample time for discussion and questions allowed. The patient understood, was satisfied, and agreed to proceed.     HPI: Rhonda Colon is a 46 y.o. female who presents for colonoscopy for evaluation of colon cancer screening .  Patient was most recently seen in the Gastroenterology Clinic on 09/02/21.  No interval change in medical history since that appointment. Please refer to that note for full details regarding GI history and clinical presentation.   Past Medical History:  Diagnosis Date   Anxiety    CAD (coronary artery disease) 07/2011   cardiologist--- dr Antoine Poche--   a. 07/2011 NSTEMI/Cath: RCA 99%m, otw nl cors, EF 50%, inf hk -> RCA stented with 3.0x88mm Promus DES   Depression    GERD (gastroesophageal reflux disease)    History of non-ST elevation myocardial infarction (NSTEMI) 07/2011   s/p PCI and stenting   History of sepsis 04/2021   hospital admission---  sepsis due to infected mesh post op diaphramatic hernia repair,  intrathoracic abscess w/ pleural effusion   Hyperlipidemia    Hypertension    Morgagni hernia    followed by dr h. littlefoot (cardiac / thoracic surgeon)  lov note in epic 10-17-2021 pt released prn//    a. Discovered incidentally on CT 07/2011  (diaphragmatic hernia);    02-24-2021  s/p repair with mesh;  hernia recurrence 04-04-2021/  02/ 2023 infected mesh w/ sepsis,  s/p unroof abscess/ pleural irrigation system placed/   re-do hernia repair 06-02-2021   Myocardial infarction [facility])    2013   Obesity    S/P drug eluting coronary stent placement 08/10/2011   DES x1  to  mRCA    Sinus bradycardia    a. Nocturnal, asymptomatic   Sun allergy    per pt dx via testing    Past Surgical History:  Procedure Laterality Date   CARDIAC CATHETERIZATION  08/10/2011   left heart, with angiogram; DES mid RCA   DIAPHRAGMATIC HERNIA REPAIR  06/02/2021   with mesh removal due to sepsis   DILATATION & CURETTAGE/HYSTEROSCOPY WITH MYOSURE N/A 11/24/2021   Procedure: DILATATION & CURETTAGE/HYSTEROSCOPY;  Surgeon: Romualdo Bolk, MD;  Location: [facility];  Service: Gynecology;  Laterality: N/A;   INTERCOSTAL NERVE BLOCK Right 05/22/2021   Procedure: INTERCOSTAL NERVE BLOCK;  Surgeon: Corliss Skains, MD;  Location: [facility] OR;  Service: Thoracic;  Laterality: Right;   INTRAUTERINE DEVICE (IUD) INSERTION N/A 11/24/2021   Procedure: Mirena INTRAUTERINE DEVICE (IUD) INSERTION;  Surgeon: Romualdo Bolk, MD;  Location: [facility] [place];  Service: Gynecology;  Laterality: N/A;   IR RADIOLOGIST EVAL & MGMT  05/05/2021   LEFT HEART CATHETERIZATION WITH CORONARY ANGIOGRAM N/A 08/10/2011   Procedure: LEFT HEART CATHETERIZATION WITH CORONARY ANGIOGRAM;  Surgeon: Kathleene Hazel, MD;  Location: [facility] C[facility] LAB;  Service: Cardiovascular;  Laterality: N/A;   OPERATIVE ULTRASOUND N/A 11/24/2021   Procedure: OPERATIVE ULTRASOUND;  Surgeon: Romualdo Bolk, MD;  Location: [facility];  Service: Gynecology;  Laterality: N/A;   TONSILLECTOMY     child   XI ROBOTIC ASSISTED REPAIR OF DIAPHRAGMATIC HERNIA  02/24/2021   @  [facility]  by dr Irving Copas;   w/ mesh    Prior to Admission medications   Medication Sig Start Date End Date Taking? Authorizing Provider  acetaminophen (TYLENOL) 500 MG tablet Take 500 mg by mouth every 8 (eight) hours as needed for moderate pain.   Yes [provider]  ALPRAZolam Prudy Feeler) 1 MG tablet Take 0.5-1 tablets (0.5-1 mg total) by mouth 2 (two) times daily as needed for anxiety. 06/05/21  Yes Doree Fudge M, PA-C   aspirin 81 MG tablet Take 81 mg by mouth daily.   Yes [provider]  FLUoxetine (PROZAC) 40 MG capsule Take 1 capsule by mouth once daily 12/04/21  Yes Baxley, Luanna Cole, MD  levonorgestrel (MIRENA) 20 MCG/DAY IUD 1 each by Intrauterine route once.   Yes [provider]  lisinopril (ZESTRIL) 10 MG tablet Take 1 tablet (10 mg total) by mouth daily. 07/30/21  Yes Rollene Rotunda, MD  Multiple Vitamin (MULTIVITAMIN) capsule Take 1 capsule by mouth daily.   Yes [provider]  nitroGLYCERIN (NITROSTAT) 0.4 MG SL tablet Place 1 tablet (0.4 mg total) under the tongue every 5 (five) minutes as needed for chest pain. 06/17/20  Yes Rollene Rotunda, MD  omeprazole (PRILOSEC) 20 MG capsule Take 1 capsule by mouth once daily 03/09/21  Yes Baxley, Luanna Cole, MD  polyethylene glycol (GOLYTELY) 236 g solution Take as a split dose prep following doctor instructions 01/15/22  Yes Imogene Burn, MD  rosuvastatin (CRESTOR) 40 MG tablet Take 1 tablet by mouth once daily 02/03/22  Yes Rollene Rotunda, MD  acetaminophen (TYLENOL) 325 MG tablet Take 2 tablets (650 mg total) by mouth every 6 (six) hours as needed for moderate pain. 02/16/22   Wallis Bamberg, PA-C  cetirizine (ZYRTEC) 10 MG tablet Take 10 mg by mouth daily as needed for allergies.    [provider]  gabapentin (NEURONTIN) 100 MG capsule Take one tablet po qhs, if tolerating can increase to 2 tablets po qhs after 3 days, if tolerating can increase to 3 tablets po qhs after another 3 days. Patient not taking: Reported on 02/25/2022 09/23/21   Romualdo Bolk, MD  predniSONE (DELTASONE) 20 MG tablet Take 2 tablets daily with breakfast. Patient not taking: Reported on 02/25/2022 02/16/22   Wallis Bamberg, PA-C  tiZANidine (ZANAFLEX) 4 MG tablet Take 1 tablet (4 mg total) by mouth at bedtime. Patient not taking: Reported on 02/25/2022 02/16/22   Wallis Bamberg, PA-C    Current Facility-Administered Medications  Medication Dose Route  Frequency Provider Last Rate Last Admin   0.9 %  sodium chloride infusion   Intravenous Continuous Imogene Burn, MD        Allergies as of 01/15/2022 - Review Complete 12/02/2021  Allergen Reaction Noted   Wound dressing adhesive Dermatitis and Rash 04/30/2021    Family History  Problem Relation Age of Onset   Coronary artery disease Mother 70        stent   Heart attack Mother    Heart disease Mother    Hyperlipidemia Mother    Obesity Mother    Colon polyps Mother    Arrhythmia Father    Depression Father    Breast cancer Maternal Grandmother    Coronary artery disease Maternal Aunt 79       (Mother's twin sister)   Lung cancer Maternal Aunt    Heart attack Maternal Aunt        mother's twin   Colon polyps Maternal Aunt  Colon cancer Maternal Uncle    Coronary artery disease Maternal Uncle 46   Heart attack Maternal Uncle    Breast cancer Cousin 38   Cancer Neg Hx    Early death Neg Hx    Hypertension Neg Hx    Kidney disease Neg Hx    Stroke Neg Hx    Alcohol abuse Neg Hx    Diabetes Neg Hx    Drug abuse Neg Hx     Social History   Socioeconomic History   Marital status: Significant Other    Spouse name: Not on file   Number of children: 0   Years of education: Not on file   Highest education level: Not on file  Occupational History   Occupation: Realtor  Tobacco Use   Smoking status: Former    Packs/day: 1.00    Years: 18.00    Total pack years: 18.00    Types: Cigarettes    Quit date: 08/09/2011    Years since quitting: 10.5   Smokeless tobacco: Never   Tobacco comments:    2013  Vaping Use   Vaping Use: Never used  Substance and Sexual Activity   Alcohol use: Yes    Comment: occasionally   Drug use: Never   Sexual activity: Yes    Birth control/protection: I.U.D.  Other Topics Concern   Not on file  Social History Narrative   Lives alone.  Occasional EtOH.  Works as a Veterinary surgeon.   Social Determinants of Health   Financial Resource  Strain: Not on file  Food Insecurity: Not on file  Transportation Needs: Not on file  Physical Activity: Not on file  Stress: Not on file  Social Connections: Not on file  Intimate Partner Violence: Not on file    Physical Exam: Vital signs in last 24 hours: LMP 02/04/2022 (Approximate) Comment: HCG sent GEN: NAD EYE: Sclerae anicteric ENT: MMM CV: Non-tachycardic Pulm: No increased WOB GI: Soft NEURO:  Alert & Oriented   Eulah Pont, MD [facility]   02/26/2022 8:11 AM

## 2022-02-26 NOTE — Transfer of Care (Signed)
Immediate Anesthesia Transfer of Care Note  Patient: Rhonda Colon  Procedure(s) Performed: COLONOSCOPY WITH PROPOFOL POLYPECTOMY  Patient Location: Endoscopy Unit  Anesthesia Type:MAC  Level of Consciousness: awake, alert , and oriented  Airway & Oxygen Therapy: Patient Spontanous Breathing and Patient connected to face mask oxygen  Post-op Assessment: Report given to RN and Post -op Vital signs reviewed and stable  Post vital signs: Reviewed and stable  Last Vitals:  Vitals Value Taken Time  BP    Temp    Pulse 85 02/26/22 0916  Resp    SpO2 98 % 02/26/22 0916  Vitals shown include unvalidated device data.  Last Pain:  Vitals:   02/26/22 0824  TempSrc: Temporal  PainSc: 0-No pain         Complications: No notable events documented.

## 2022-02-26 NOTE — Telephone Encounter (Signed)
Rhonda Colon [phone]  [name] called to say she had fallen on 02/16/2022 and she is still having upper arm pain, discomfort, also limited use. They done X-Ray of shoulder when she went to Urgent Care did not find anything wrong. Should she come see you or go to Ortho?

## 2022-02-26 NOTE — Telephone Encounter (Signed)
Called and let patient know to contact Ortho, she verbalized understanding

## 2022-02-26 NOTE — Anesthesia Procedure Notes (Signed)
Procedure Name: MAC Date/Time: 02/26/2022 8:32 AM  Performed by: Maxwell Caul, CRNAPre-anesthesia Checklist: Patient identified, Emergency Drugs available, Suction available and Patient being monitored Oxygen Delivery Method: Simple face mask

## 2022-02-26 NOTE — Op Note (Signed)
[facility] Patient Name: Rhonda LigasLorie Colon Procedure Date: 02/26/2022 MRN: [id number] Attending MD: Particia LatherYing "Claire" Zoraida Havrilla , , [phone] Date of Birth: 06/16/1975 CSN: [id number] Age: 89 Admit Type: Outpatient Procedure:                Colonoscopy Indications:              Screening for colorectal malignant neoplasm Providers:                Madelyn BrunnerYing "Claire" Cassie Freerorsey, Sinclair Robin, RN, Marge DuncansPatti                            Thompson, RN, Kandice RobinsonsGuillaume Awaka, Technician Referring MD:             Luanna ColeMary J. Baxley Medicines:                Monitored Anesthesia Care Complications:            No immediate complications. Estimated Blood Loss:     Estimated blood loss was minimal. Procedure:                Pre-Anesthesia Assessment:                           - Prior to the procedure, a History and Physical                            was performed, and patient medications and                            allergies were reviewed. The patient's tolerance of                            previous anesthesia was also reviewed. The risks                            and benefits of the procedure and the sedation                            options and risks were discussed with the patient.                            All questions were answered, and informed consent                            was obtained. Prior Anticoagulants: The patient has                            taken no anticoagulant or antiplatelet agents. ASA                            Grade Assessment: III - A patient with severe                            systemic disease. After reviewing the risks and  benefits, the patient was deemed in satisfactory                            condition to undergo the procedure.                           After obtaining informed consent, the colonoscope                            was passed under direct vision. Throughout the                            procedure, the patient's blood  pressure, pulse, and                            oxygen saturations were monitored continuously. The                            CF-HQ190L ([id number]) Olympus colonoscope was                            introduced through the anus and advanced to the the                            terminal ileum. The colonoscopy was performed                            without difficulty. The patient tolerated the                            procedure well. The quality of the bowel                            preparation was good. The terminal ileum, ileocecal                            valve, appendiceal orifice, and rectum were                            photographed. Scope In: 8:41:53 AM Scope Out: 9:09:57 AM Scope Withdrawal Time: 0 hours 23 minutes 42 seconds  Total Procedure Duration: 0 hours 28 minutes 4 seconds  Findings:      The terminal ileum appeared normal.      Three sessile polyps were found in the descending colon and transverse       colon. The polyps were 3 to 5 mm in size. These polyps were removed with       a cold snare. Resection and retrieval were complete.      A 10 mm polyp was found in the descending colon. The polyp was       pedunculated. The polyp was removed with a hot snare. Resection and       retrieval were complete. To prevent bleeding after the polypectomy, one       hemostatic clip was successfully placed. There was no bleeding at the  end of the procedure.      Multiple diverticula were found in the sigmoid colon and descending       colon.      Non-bleeding internal hemorrhoids were found during retroflexion. Impression:               - The examined portion of the ileum was normal.                           - Three 3 to 5 mm polyps in the descending colon                            and in the transverse colon, removed with a cold                            snare. Resected and retrieved.                           - One 10 mm polyp in the descending colon, removed                             with a hot snare. Resected and retrieved. Clip was                            placed.                           - Diverticulosis in the sigmoid colon and in the                            descending colon.                           - Non-bleeding internal hemorrhoids. Moderate Sedation:      Not Applicable - Patient had care per Anesthesia. Recommendation:           - Discharge patient to home (with escort).                           - Await pathology results.                           - The findings and recommendations were discussed                            with the patient. Procedure Code(s):        --- Professional ---                           [phone], Colonoscopy, flexible; with removal of                            tumor(s), polyp(s), or other lesion(s) by snare                            technique Diagnosis Code(s):        ---  Professional ---                           Z12.11, Encounter for screening for malignant                            neoplasm of colon                           K64.8, Other hemorrhoids                           D12.4, Benign neoplasm of descending colon                           D12.3, Benign neoplasm of transverse colon (hepatic                            flexure or splenic flexure)                           K57.30, Diverticulosis of large intestine without                            perforation or abscess without bleeding CPT copyright 2022 American Medical Association. All rights reserved. The codes documented in this report are preliminary and upon coder review may  be revised to meet current compliance requirements. Dr Particia Lather "Alan Ripper" Leonides Schanz,  02/26/2022 9:16:01 AM Number of Addenda: 0

## 2022-02-27 LAB — SURGICAL PATHOLOGY

## 2022-02-27 NOTE — Anesthesia Postprocedure Evaluation (Signed)
Anesthesia Post Note  Patient: Jazmon A Lofton  Procedure(s) Performed: COLONOSCOPY WITH PROPOFOL POLYPECTOMY HEMOSTASIS CLIP PLACEMENT     Patient location during evaluation: Endoscopy Anesthesia Type: MAC Level of consciousness: awake and alert Pain management: pain level controlled Vital Signs Assessment: post-procedure vital signs reviewed and stable Respiratory status: spontaneous breathing, nonlabored ventilation, respiratory function stable and patient connected to nasal cannula oxygen Cardiovascular status: blood pressure returned to baseline and stable Postop Assessment: no apparent nausea or vomiting Anesthetic complications: no  No notable events documented.  Last Vitals:  Vitals:   02/26/22 0920 02/26/22 0930  BP: 115/71 132/62  Pulse: 86 86  Resp: 18 20  Temp:    SpO2: 100% 97%    Last Pain:  Vitals:   02/26/22 0930  TempSrc:   PainSc: 0-No pain                 Daris Aristizabal L Anael Rosch

## 2022-03-01 DIAGNOSIS — M25511 Pain in right shoulder: Secondary | ICD-10-CM | POA: Insufficient documentation

## 2022-03-02 ENCOUNTER — Encounter (HOSPITAL_COMMUNITY): Payer: Self-pay | Admitting: Internal Medicine

## 2022-03-02 DIAGNOSIS — M25511 Pain in right shoulder: Secondary | ICD-10-CM | POA: Diagnosis not present

## 2022-03-04 ENCOUNTER — Encounter: Payer: Self-pay | Admitting: Internal Medicine

## 2022-03-21 ENCOUNTER — Other Ambulatory Visit: Payer: Self-pay | Admitting: Internal Medicine

## 2022-03-21 DIAGNOSIS — F419 Anxiety disorder, unspecified: Secondary | ICD-10-CM

## 2022-03-21 MED ORDER — FLUOXETINE HCL 40 MG PO CAPS: 40.0000 mg | ORAL_CAPSULE | Freq: Every day | ORAL | 0 refills | Status: DC

## 2022-03-21 MED ORDER — ALPRAZOLAM 1 MG PO TABS: 0.5000 mg | ORAL_TABLET | Freq: Two times a day (BID) | ORAL | 0 refills | Status: DC | PRN

## 2022-03-21 MED ORDER — ALPRAZOLAM 1 MG PO TABS: 1.0000 mg | ORAL_TABLET | Freq: Two times a day (BID) | ORAL | 0 refills | Status: DC | PRN

## 2022-03-21 NOTE — Telephone Encounter (Addendum)
From: Annia Friendly To: Office of Ardelle Balls, [place] Sent: 03/21/2022  8:30 AM EST Subject: Medication Renewal Request  Refills have been requested for the following medications:      ALPRAZolam (XANAX) 1 MG tablet [Donielle M Zimmerman]  Preferred pharmacy: [facility] - [place], [place] - [address] Delivery method: Pickup   Medication renewals requested in this message routed separately:      FLUoxetine (PROZAC) 40 MG capsule [Rayvon Dakin J Joaovictor Krone]   I have refilled both Xanax and Prozac. Patient has longstanding history of Anxiety and Depression. Ms Joycelyn Man works for [employer] here in [place] and Xanax is best handled by PCP  MJB, MD

## 2022-03-21 NOTE — Addendum Note (Signed)
Addended by: Margaree Mackintosh on: 03/21/2022 10:41 AM   Modules accepted: Orders

## 2022-05-25 ENCOUNTER — Ambulatory Visit: Payer: 59 | Admitting: Cardiology

## 2022-06-08 ENCOUNTER — Other Ambulatory Visit: Payer: Self-pay | Admitting: Internal Medicine

## 2022-06-14 ENCOUNTER — Other Ambulatory Visit: Payer: Self-pay | Admitting: Internal Medicine

## 2022-06-14 MED ORDER — FLUOXETINE HCL 40 MG PO CAPS: 40.0000 mg | ORAL_CAPSULE | Freq: Every day | ORAL | 3 refills | Status: DC

## 2022-08-15 ENCOUNTER — Other Ambulatory Visit: Payer: Self-pay | Admitting: Cardiology

## 2022-09-07 NOTE — Progress Notes (Signed)
47 y.o. G0P0000 Significant Other White or Caucasian Not Hispanic or Latino female here for annual exam.  No dyspareunia.   H/O hysteroscopy, D&C, mirena IUD insertion with ultrasound guidance on 11/24/21. Pathology with inactive endometrium.   Period Cycle (Days): 28 Period Duration (Days): 6-7 Period Pattern: Regular Menstrual Flow: Moderate Menstrual Control: Maxi pad Menstrual Control Change Freq (Hours): 4 Dysmenorrhea: (!) Mild Dysmenorrhea Symptoms: Cramping  H/O heart disease, s/p MI at 34.   Mild, tolerable GSI.   She has hot flashes and night sweats. Not a candidate for HRT. She was given a script for gabapentin last year and never tried it. She would like to try it. Hot flashes are worse during the day, having 3-4 a day. Having night sweats ~5 x a week. Will try the gabapentin at night.   Patient's last menstrual period was 08/25/2022.          Sexually active: Yes.    The current method of family planning is IUD.    Exercising: Yes.     Walking  Smoker:  no  Health Maintenance: Pap: 08/09/20 WNL Hr HPV Neg; 08-02-17 negative  History of abnormal Pap:  no MMG:  01/15/22 bi-rads1 neg BMD: n/a  Colonoscopy: 02/26/22, + benign polyp, f/u 5 years  TDaP:  08/08/13 Gardasil: n/a   reports that she quit smoking about 11 years ago. Her smoking use included cigarettes. She has a 18.00 pack-year smoking history. She has never used smokeless tobacco. She reports current alcohol use. She reports that she does not use drugs. 3-4 glasses of wine a week. Realtor, has her own company.   Past Medical History:  Diagnosis Date   Anxiety    CAD (coronary artery disease) 07/2011   cardiologist--- dr Antoine Poche--   a. 07/2011 NSTEMI/Cath: RCA 99%m, otw nl cors, EF 50%, inf hk -> RCA stented with 3.0x58mm Promus DES   Depression    GERD (gastroesophageal reflux disease)    History of non-ST elevation myocardial infarction (NSTEMI) 07/2011   s/p PCI and stenting   History of sepsis 04/2021    hospital admission---  sepsis due to infected mesh post op diaphramatic hernia repair,  intrathoracic abscess w/ pleural effusion   Hyperlipidemia    Hypertension    Morgagni hernia    followed by dr h. littlefoot (cardiac / thoracic surgeon)  lov note in epic 10-17-2021 pt released prn//    a. Discovered incidentally on CT 07/2011  (diaphragmatic hernia);    02-24-2021  s/p repair with mesh;  hernia recurrence 04-04-2021/  02/ 2023 infected mesh w/ sepsis,  s/p unroof abscess/ pleural irrigation system placed/   re-do hernia repair 06-02-2021   Myocardial infarction [facility])    2013   Obesity    S/P drug eluting coronary stent placement 08/10/2011   DES x1  to  mRCA   Sinus bradycardia    a. Nocturnal, asymptomatic   Sun allergy    per pt dx via testing    Past Surgical History:  Procedure Laterality Date   CARDIAC CATHETERIZATION  08/10/2011   left heart, with angiogram; DES mid RCA   COLONOSCOPY WITH PROPOFOL N/A 02/26/2022   Procedure: COLONOSCOPY WITH PROPOFOL;  Surgeon: Imogene Burn, MD;  Location: [facility];  Service: Gastroenterology;  Laterality: N/A;   DIAPHRAGMATIC HERNIA REPAIR  06/02/2021   with mesh removal due to sepsis   DILATATION & CURETTAGE/HYSTEROSCOPY WITH MYOSURE N/A 11/24/2021   Procedure: DILATATION & CURETTAGE/HYSTEROSCOPY;  Surgeon: Romualdo Bolk, MD;  Location:  [facility];  Service: Gynecology;  Laterality: N/A;   HEMOSTASIS CLIP PLACEMENT  02/26/2022   Procedure: HEMOSTASIS CLIP PLACEMENT;  Surgeon: Imogene Burn, MD;  Location: [facility];  Service: Gastroenterology;;   INTERCOSTAL NERVE BLOCK Right 05/22/2021   Procedure: INTERCOSTAL NERVE BLOCK;  Surgeon: Corliss Skains, MD;  Location: [facility];  Service: Thoracic;  Laterality: Right;   INTRAUTERINE DEVICE (IUD) INSERTION N/A 11/24/2021   Procedure: Mirena INTRAUTERINE DEVICE (IUD) INSERTION;  Surgeon: Romualdo Bolk, MD;  Location[facility];  Service:  Gynecology;  Laterality: N/A;   IR RADIOLOGIST EVAL & MGMT  05/05/2021   LEFT HEART CATHETERIZATION WITH CORONARY ANGIOGRAM N/A 08/10/2011   Procedure: LEFT HEART CATHETERIZATION WITH CORONARY ANGIOGRAM;  Surgeon: Kathleene Hazel, MD;  Location: [facility];  Service: Cardiovascular;  Laterality: N/A;   OPERATIVE ULTRASOUND N/A 11/24/2021   Procedure: OPERATIVE ULTRASOUND;  Surgeon: Romualdo Bolk, MD;  Location[facility];  Service: Gynecology;  Laterality: N/A;   POLYPECTOMY  02/26/2022   Procedure: POLYPECTOMY;  Surgeon: Imogene Burn, MD;  Location: [facility];  Service: Gastroenterology;;   TONSILLECTOMY     child   XI ROBOTIC ASSISTED REPAIR OF DIAPHRAGMATIC HERNIA  02/24/2021   @[facility]   by dr Irving Copas;   w/ mesh    Current Outpatient Medications  Medication Sig Dispense Refill   acetaminophen (TYLENOL) 500 MG tablet Take 500 mg by mouth every 8 (eight) hours as needed for moderate pain.     ALPRAZolam (XANAX) 1 MG tablet Take 1 tablet (1 mg total) by mouth 2 (two) times daily as needed for anxiety. 90 tablet 0   aspirin 81 MG tablet Take 81 mg by mouth daily.     cetirizine (ZYRTEC) 10 MG tablet Take 10 mg by mouth daily as needed for allergies.     FLUoxetine (PROZAC) 40 MG capsule Take 1 capsule (40 mg total) by mouth daily. 90 capsule 3   levonorgestrel (MIRENA) 20 MCG/DAY IUD 1 each by Intrauterine route once.     lisinopril (ZESTRIL) 10 MG tablet Take 1 tablet by mouth once daily 90 tablet 1   Multiple Vitamin (MULTIVITAMIN) capsule Take 1 capsule by mouth daily.     nitroGLYCERIN (NITROSTAT) 0.4 MG SL tablet Place 1 tablet (0.4 mg total) under the tongue every 5 (five) minutes as needed for chest pain. 25 tablet 3   omeprazole (PRILOSEC) 20 MG capsule Take 1 capsule by mouth once daily 90 capsule 0   rosuvastatin (CRESTOR) 40 MG tablet Take 1 tablet by mouth once daily 90 tablet 3   No current facility-administered medications for this visit.     Family History  Problem Relation Age of Onset   Coronary artery disease Mother 75        stent   Heart attack Mother    Heart disease Mother    Hyperlipidemia Mother    Obesity Mother    Colon polyps Mother    Arrhythmia Father    Depression Father    Breast cancer Maternal Grandmother    Coronary artery disease Maternal Aunt 19       (Mother's twin sister)   Lung cancer Maternal Aunt    Heart attack Maternal Aunt        mother's twin   Colon polyps Maternal Aunt    Colon cancer Maternal Uncle    Coronary artery disease Maternal Uncle 46   Heart attack Maternal Uncle    Breast cancer  Cousin 38   Cancer Neg Hx    Early death Neg Hx    Hypertension Neg Hx    Kidney disease Neg Hx    Stroke Neg Hx    Alcohol abuse Neg Hx    Diabetes Neg Hx    Drug abuse Neg Hx     Review of Systems  All other systems reviewed and are negative.   Exam:   BP 132/82   Pulse 88   Ht 5' 4.5" (1.638 m)   Wt (!) 343 lb (155.6 kg)   LMP 08/25/2022   SpO2 100%   BMI 57.97 kg/m   Weight change: @WEIGHTCHANGE @ Height:   Height: 5' 4.5" (163.8 cm)  Ht Readings from Last 3 Encounters:  09/15/22 5' 4.5" (1.638 m)  02/26/22 5\' 4"  (1.626 m)  01/22/22 5\' 4"  (1.626 m)    General appearance: alert, cooperative and appears stated age Head: Normocephalic, without obvious abnormality, atraumatic Neck: no adenopathy, supple, symmetrical, trachea midline and thyroid normal to inspection and palpation Lungs: clear to auscultation bilaterally Cardiovascular: regular rate and rhythm Breasts: normal appearance, no masses or tenderness Abdomen: soft, non-tender; non distended,  no masses,  no organomegaly Extremities: extremities normal, atraumatic, no cyanosis or edema Skin: Skin color, texture, turgor normal. No rashes or lesions Lymph nodes: Cervical, supraclavicular, and axillary nodes normal. No abnormal inguinal nodes palpated Neurologic: Grossly normal   Pelvic: External genitalia:  no  lesions              Urethra:  normal appearing urethra with no masses, tenderness or lesions              Bartholins and Skenes: normal                 Vagina: normal appearing vagina with normal color and discharge, no lesions              Cervix: no lesions and IUD string 3 cm               Bimanual Exam:  Uterus:   no masses or tenderness              Adnexa: no mass, fullness, tenderness               Rectovaginal: Confirms               Anus:  normal sphincter tone, no lesions  Carolynn Serve, CMA chaperoned for the exam.  1. Well woman exam Discussed breast self exam Discussed calcium and vit D intake Mammogram UTD Colonoscopy UTD No pap this year Labs with primary  2. Perimenopausal vasomotor symptoms Not a candidate for HRT - gabapentin (NEURONTIN) 100 MG capsule; Take one capsule po at bedtime, if tolerating after 3 days you can increase to 2 capsules po at bedtime and after another 3 days you can increase to 3 capsules po at bedtime (if tolerating)  Dispense: 90 capsule; Refill: 0  3. IUD check up Doing fine

## 2022-09-10 NOTE — Progress Notes (Addendum)
Patient Care Team: Margaree Mackintosh, MD as PCP - General (Internal Medicine) Rollene Rotunda, MD as PCP - Cardiology (Cardiology) Corliss Skains, MD as Consulting Physician (Cardiothoracic Surgery)  Visit Date: 09/17/22  Subjective:    Patient ID: Rhonda Colon , Female   DOB: [birth year], 47 y.o.    MRN: [id number]   47 y.o. Female presents today for annual comprehensive physical exam.  Has been having increased fatigue since hernia repair in 05/2021. Walks in the morning and does gardening. She has tried going to the weight loss clinic and is interested in going back.  History of anxiety and depression treated with alprazolam 1 mg twice daily as needed, fluoxetine 40 mg daily. Mood has been down since surgery last year.   History of hyperlipidemia treated with rosuvastatin 40 mg daily. Lipid panel normal.  History of hypertension. Not currently taking any medication.  Blood pressure normal today at 118/84.  History of GERD treated with omeprazole 20 mg daily.  Still has elevated C-reactive protein. Infectious Disease physician thinks this is from inflammation.  She is on amoxicillin 500 mg 3 times a day as of May 2 for 30 days per infectious disease physician.   She has a remote history of MI in 2013 after which she quit smoking.  History of hypertension and hyperlipidemia.  Followed by Dr. Antoine Poche as well.  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   Status post dilation and curettage/hysteroscopy, operative ultrasound, Mirena intrauterine device insertion 2023.  Glucose slightly elevated at 114. AST elevated at 38. ALT at 38. CBC normal. TSH at 1.94.  Pap smear last completed 08/09/20. Negative for intraepithelial lesion or malignancy. Recommended repeat in 2025. Her gynecologist, Dr. Gertie Exon is retiring.  Mammogram last completed 01/15/22. No mammographic evidence of malignancy. Recommended repeat in  2024.  Colonoscopy last completed 02/26/22. Showed three 3-5 mm polyps in descending, transverse colon, one 10 mm polyp in descending colon, diverticulosis in sigmoid and descending colon, non-bleeding internal hemorrhoids.  Social history: She is single.  Social alcohol consumption.  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 Takes care of her mother several days a week.  Family history: Colon cancer in maternal uncle and great grandfather.  Maternal aunt died with history of coronary artery disease, valvular disease and lung cancer in 2019.   Past Medical History:  Diagnosis Date   Anxiety    CAD (coronary artery disease) 07/2011   cardiologist--- dr Antoine Poche--   a. 07/2011 NSTEMI/Cath: RCA 99%m, otw nl cors, EF 50%, inf hk -> RCA stented with 3.0x70mm Promus DES   Depression    GERD (gastroesophageal reflux disease)    History of non-ST elevation myocardial infarction (NSTEMI) 07/2011   s/p PCI and stenting   History of sepsis 04/2021   hospital admission---  sepsis due to infected mesh post op diaphramatic hernia repair,  intrathoracic abscess w/ pleural effusion   Hyperlipidemia    Hypertension    Morgagni hernia    followed by dr h. littlefoot (cardiac / thoracic surgeon)  lov note in epic 10-17-2021 pt released prn//    a. Discovered incidentally on CT 07/2011  (diaphragmatic hernia);    02-24-2021  s/p repair with mesh;  hernia recurrence 04-04-2021/  02/ 2023 infected mesh w/ sepsis,  s/p unroof abscess/ pleural irrigation system placed/   re-do hernia repair 06-02-2021   Myocardial infarction [facility])    2013   Obesity  S/P drug eluting coronary stent placement 08/10/2011   DES x1  to  mRCA   Sinus bradycardia    a. Nocturnal, asymptomatic   Sun allergy    per pt dx via testing     Family History  Problem Relation Age of Onset   Coronary artery disease Mother 53        stent   Heart attack Mother    Heart disease Mother    Hyperlipidemia Mother    Obesity  Mother    Colon polyps Mother    Arrhythmia Father    Depression Father    Breast cancer Maternal Grandmother    Coronary artery disease Maternal Aunt 46       (Mother's twin sister)   Lung cancer Maternal Aunt    Heart attack Maternal Aunt        mother's twin   Colon polyps Maternal Aunt    Colon cancer Maternal Uncle    Coronary artery disease Maternal Uncle 46   Heart attack Maternal Uncle    Breast cancer Cousin 38   Cancer Neg Hx    Early death Neg Hx    Hypertension Neg Hx    Kidney disease Neg Hx    Stroke Neg Hx    Alcohol abuse Neg Hx    Diabetes Neg Hx    Drug abuse Neg Hx     Social History   Social History Narrative   Lives alone.  Occasional EtOH.  Works as a Veterinary surgeon.      Review of Systems  Constitutional:  Positive for malaise/fatigue. Negative for chills, fever and weight loss.  HENT:  Negative for hearing loss, sinus pain and sore throat.   Respiratory:  Negative for cough, hemoptysis and shortness of breath.   Cardiovascular:  Negative for chest pain, palpitations, leg swelling and PND.  Gastrointestinal:  Negative for abdominal pain, constipation, diarrhea, heartburn, nausea and vomiting.  Genitourinary:  Negative for dysuria, frequency and urgency.  Musculoskeletal:  Negative for back pain, myalgias and neck pain.  Skin:  Negative for itching and rash.  Neurological:  Negative for dizziness, tingling, seizures and headaches.  Endo/Heme/Allergies:  Negative for polydipsia.  Psychiatric/Behavioral:  Positive for depression. The patient is not nervous/anxious.         Objective:   Vitals: BP 118/84   Pulse 88   Temp 98.6 F (37 C) (Tympanic)   Resp 18   Ht 5' 4.25" (1.632 m)   Wt (!) 344 lb 8 oz (156.3 kg)   LMP 08/25/2022   SpO2 96%   BMI 58.67 kg/m    Physical Exam Vitals and nursing note reviewed.  Constitutional:      General: She is not in acute distress.    Appearance: Normal appearance. She is not ill-appearing or  toxic-appearing.  HENT:     Head: Normocephalic and atraumatic.     Right Ear: Hearing, tympanic membrane, ear canal and external ear normal.     Left Ear: Hearing, tympanic membrane, ear canal and external ear normal.     Mouth/Throat:     Pharynx: Oropharynx is clear.  Eyes:     Extraocular Movements: Extraocular movements intact.     Pupils: Pupils are equal, round, and reactive to light.  Neck:     Thyroid: No thyroid mass, thyromegaly or thyroid tenderness.     Vascular: No carotid bruit.  Cardiovascular:     Rate and Rhythm: Normal rate and regular rhythm. No extrasystoles are present.  Pulses:          Dorsalis pedis pulses are 1+ on the right side and 1+ on the left side.     Heart sounds: Normal heart sounds. No murmur heard.    No friction rub. No gallop.  Pulmonary:     Effort: Pulmonary effort is normal.     Breath sounds: Normal breath sounds. No decreased breath sounds, wheezing, rhonchi or rales.  Chest:     Chest wall: No mass.  Abdominal:     Palpations: Abdomen is soft. There is no hepatomegaly, splenomegaly or mass.     Tenderness: There is no abdominal tenderness.     Hernia: No hernia is present.  Musculoskeletal:     Cervical back: Normal range of motion.     Right lower leg: 1+ Edema present.     Left lower leg: 1+ Edema present.  Lymphadenopathy:     Cervical: No cervical adenopathy.     Upper Body:     Right upper body: No supraclavicular adenopathy.     Left upper body: No supraclavicular adenopathy.  Skin:    General: Skin is warm and dry.  Neurological:     General: No focal deficit present.     Mental Status: She is alert and oriented to person, place, and time. Mental status is at baseline.     Sensory: Sensation is intact.     Motor: Motor function is intact. No weakness.     Deep Tendon Reflexes: Reflexes are normal and symmetric.  Psychiatric:        Attention and Perception: Attention normal.        Mood and Affect: Mood normal.         Speech: Speech normal.        Behavior: Behavior normal.        Thought Content: Thought content normal.        Cognition and Memory: Cognition normal.        Judgment: Judgment normal.       Results:   Studies obtained and personally reviewed by me:  Pap smear last completed 08/09/20. Negative for intraepithelial lesion or malignancy. Recommended repeat in 2025. Her dermatologist, Dr. Gertie Exon.  Mammogram last completed 01/15/22. No mammographic evidence of malignancy. Recommended repeat in 2024.  Colonoscopy last completed 02/26/22. Showed three 3-5 mm polyps in descending, transverse colon, one 10 mm polyp in descending colon, diverticulosis in sigmoid and descending colon, non-bleeding internal hemorrhoids.  Labs:       Component Value Date/Time   NA 140 09/15/2022 0912   NA 138 06/11/2020 1113   K 4.6 09/15/2022 0912   CL 102 09/15/2022 0912   CO2 30 09/15/2022 0912   GLUCOSE 114 (H) 09/15/2022 0912   BUN 8 09/15/2022 0912   BUN 10 06/11/2020 1113   CREATININE 0.69 09/15/2022 0912   CALCIUM 9.4 09/15/2022 0912   PROT 6.9 09/15/2022 0912   PROT 7.1 11/04/2020 1110   ALBUMIN 2.3 (L) 05/24/2021 0604   ALBUMIN 4.2 11/04/2020 1110   AST 38 (H) 09/15/2022 0912   ALT 38 (H) 09/15/2022 0912   ALKPHOS 73 05/24/2021 0604   BILITOT 0.6 09/15/2022 0912   BILITOT 0.2 11/04/2020 1110   GFRNONAA >60 06/04/2021 0502   GFRNONAA 97 07/30/2020 0944   GFRAA 113 07/30/2020 0944     Lab Results  Component Value Date   WBC 9.0 09/15/2022   HGB 15.2 09/15/2022   HCT 44.9 09/15/2022   MCV 91.1 09/15/2022  PLT 308 09/15/2022    Lab Results  Component Value Date   CHOL 154 09/15/2022   HDL 52 09/15/2022   LDLCALC 82 09/15/2022   TRIG 105 09/15/2022   CHOLHDL 3.0 09/15/2022    Lab Results  Component Value Date   HGBA1C 5.5 08/11/2021     Lab Results  Component Value Date   TSH 1.94 09/15/2022      Assessment & Plan:   Fatigue: has felt unusually tired since  Morgagni hernia repair in 05/2021. Walks in the morning and does gardening.   BMI at 58.67: she has tried going to the weight loss clinic before and would like to return. Given information for [facility], [employer].  Anxiety and depression: treated with alprazolam 1 mg twice daily as needed, fluoxetine 40 mg daily. Mood has been down since surgery last year. Given information for counselor.  Hyperlipidemia: treated with rosuvastatin 40 mg daily. Lipid panel normal.   Dependent edema: ankle swelling has been worse recently. Ordered BNP.  Hypertension: not currently taking any medication.  Blood pressure normal today at 118/84.  GERD: treated with omeprazole 20 mg daily.  History of Morgagni hernia repair by Dr. Cliffton Asters  Type 2 diabetes mellitus hemoglobin A1c elevated at 6.2%.  Had been mid 5% level in 2022 in 2023.  Needs to take diet exercise and weight loss seriously.  Needs to consider medication for glucose intolerance.  She declines at this time.  Pap smear: last completed 08/09/20. Negative for intraepithelial lesion or malignancy. Recommended repeat in 2025. Her GYN, Dr. Gertie Exon.  Mammogram: last completed 01/15/22. No mammographic evidence of malignancy. Recommended repeat in 2024.  Colonoscopy: last completed 02/26/22. Showed three 3-5 mm polyps in descending, transverse colon, one 10 mm polyp in descending colon, diverticulosis in sigmoid and descending colon, non-bleeding internal hemorrhoids.  Vaccine counseling: UTD on tetanus vaccine.  Had pneumococcal 23 vaccine in 2023.  Should get pneumococcal 20 vaccine in the near future.  Consider Shingrix vaccines at age 25.  Continue to get annual flu vaccine.  Tetanus update due in May 2025  Return in 1 year for health maintenance exam or as needed.  Suggest calling either [facility] or [facility] for weight loss assistance.  Have spoken with her about gastric bypass surgery but she  declines.  Consider Endocrinology referral.  BNP drawn to rule out heart failure and result was 13  I,Alexander Ruley,acting as a scribe for Margaree Mackintosh, MD.,have documented all relevant documentation on the behalf of Margaree Mackintosh, MD,as directed by  Margaree Mackintosh, MD while in the presence of Margaree Mackintosh, MD.   I, Margaree Mackintosh, MD, have reviewed all documentation for this visit. The documentation on 09/26/22 for the exam, diagnosis, procedures, and orders are all accurate and complete.

## 2022-09-15 ENCOUNTER — Other Ambulatory Visit: Payer: 59

## 2022-09-15 ENCOUNTER — Ambulatory Visit (INDEPENDENT_AMBULATORY_CARE_PROVIDER_SITE_OTHER): Payer: 59 | Admitting: Obstetrics and Gynecology

## 2022-09-15 ENCOUNTER — Encounter: Payer: Self-pay | Admitting: Obstetrics and Gynecology

## 2022-09-15 VITALS — BP 132/82 | HR 88 | Ht 64.5 in | Wt 343.0 lb

## 2022-09-15 DIAGNOSIS — Z30431 Encounter for routine checking of intrauterine contraceptive device: Secondary | ICD-10-CM

## 2022-09-15 DIAGNOSIS — Z1329 Encounter for screening for other suspected endocrine disorder: Secondary | ICD-10-CM | POA: Diagnosis not present

## 2022-09-15 DIAGNOSIS — R7301 Impaired fasting glucose: Secondary | ICD-10-CM | POA: Diagnosis not present

## 2022-09-15 DIAGNOSIS — Z01419 Encounter for gynecological examination (general) (routine) without abnormal findings: Secondary | ICD-10-CM

## 2022-09-15 DIAGNOSIS — I10 Essential (primary) hypertension: Secondary | ICD-10-CM | POA: Diagnosis not present

## 2022-09-15 DIAGNOSIS — N951 Menopausal and female climacteric states: Secondary | ICD-10-CM

## 2022-09-15 MED ORDER — GABAPENTIN 100 MG PO CAPS: ORAL_CAPSULE | ORAL | 0 refills | Status: DC

## 2022-09-15 NOTE — Patient Instructions (Signed)

## 2022-09-17 ENCOUNTER — Other Ambulatory Visit: Payer: 59

## 2022-09-17 ENCOUNTER — Encounter: Payer: Self-pay | Admitting: Internal Medicine

## 2022-09-17 ENCOUNTER — Ambulatory Visit (INDEPENDENT_AMBULATORY_CARE_PROVIDER_SITE_OTHER): Payer: 59 | Admitting: Internal Medicine

## 2022-09-17 VITALS — BP 118/84 | HR 88 | Temp 98.6°F | Resp 18 | Ht 64.25 in | Wt 344.5 lb

## 2022-09-17 DIAGNOSIS — R5383 Other fatigue: Secondary | ICD-10-CM | POA: Diagnosis not present

## 2022-09-17 DIAGNOSIS — E119 Type 2 diabetes mellitus without complications: Secondary | ICD-10-CM | POA: Diagnosis not present

## 2022-09-17 DIAGNOSIS — Z0000 Encounter for general adult medical examination without abnormal findings: Secondary | ICD-10-CM

## 2022-09-17 DIAGNOSIS — I10 Essential (primary) hypertension: Secondary | ICD-10-CM | POA: Diagnosis not present

## 2022-09-17 DIAGNOSIS — F32A Depression, unspecified: Secondary | ICD-10-CM | POA: Diagnosis not present

## 2022-09-17 DIAGNOSIS — Z6841 Body Mass Index (BMI) 40.0 and over, adult: Secondary | ICD-10-CM | POA: Diagnosis not present

## 2022-09-17 DIAGNOSIS — R600 Localized edema: Secondary | ICD-10-CM

## 2022-09-17 DIAGNOSIS — F411 Generalized anxiety disorder: Secondary | ICD-10-CM

## 2022-09-17 DIAGNOSIS — K219 Gastro-esophageal reflux disease without esophagitis: Secondary | ICD-10-CM

## 2022-09-17 DIAGNOSIS — F419 Anxiety disorder, unspecified: Secondary | ICD-10-CM | POA: Diagnosis not present

## 2022-09-17 DIAGNOSIS — Q790 Congenital diaphragmatic hernia: Secondary | ICD-10-CM

## 2022-09-17 DIAGNOSIS — I252 Old myocardial infarction: Secondary | ICD-10-CM

## 2022-09-17 LAB — POCT URINALYSIS DIPSTICK
Bilirubin, UA: NEGATIVE
Blood, UA: NEGATIVE
Glucose, UA: NEGATIVE
Ketones, UA: NEGATIVE
Leukocytes, UA: NEGATIVE
Nitrite, UA: NEGATIVE
Protein, UA: NEGATIVE
Spec Grav, UA: 1.015 (ref 1.010–1.025)
Urobilinogen, UA: 0.2 E.U./dL
pH, UA: 7.5 (ref 5.0–8.0)

## 2022-09-17 LAB — CBC WITH DIFFERENTIAL/PLATELET
Absolute Monocytes: 621 cells/uL (ref 200–950)
Basophils Absolute: 63 cells/uL (ref 0–200)
Basophils Relative: 0.7 %
Eosinophils Absolute: 144 cells/uL (ref 15–500)
Eosinophils Relative: 1.6 %
HCT: 44.9 % (ref 35.0–45.0)
Hemoglobin: 15.2 g/dL (ref 11.7–15.5)
Lymphs Abs: 3267 cells/uL (ref 850–3900)
MCH: 30.8 pg (ref 27.0–33.0)
MCHC: 33.9 g/dL (ref 32.0–36.0)
MCV: 91.1 fL (ref 80.0–100.0)
MPV: 11.3 fL (ref 7.5–12.5)
Monocytes Relative: 6.9 %
Neutro Abs: 4905 cells/uL (ref 1500–7800)
Neutrophils Relative %: 54.5 %
Platelets: 308 10*3/uL (ref 140–400)
RBC: 4.93 10*6/uL (ref 3.80–5.10)
RDW: 12.5 % (ref 11.0–15.0)
Total Lymphocyte: 36.3 %
WBC: 9 10*3/uL (ref 3.8–10.8)

## 2022-09-17 LAB — COMPLETE METABOLIC PANEL WITH GFR
AG Ratio: 1.6 (calc) (ref 1.0–2.5)
ALT: 38 U/L — ABNORMAL HIGH (ref 6–29)
AST: 38 U/L — ABNORMAL HIGH (ref 10–35)
Albumin: 4.2 g/dL (ref 3.6–5.1)
Alkaline phosphatase (APISO): 75 U/L (ref 31–125)
BUN: 8 mg/dL (ref 7–25)
CO2: 30 mmol/L (ref 20–32)
Calcium: 9.4 mg/dL (ref 8.6–10.2)
Chloride: 102 mmol/L (ref 98–110)
Creat: 0.69 mg/dL (ref 0.50–0.99)
Globulin: 2.7 g/dL (calc) (ref 1.9–3.7)
Glucose, Bld: 114 mg/dL — ABNORMAL HIGH (ref 65–99)
Potassium: 4.6 mmol/L (ref 3.5–5.3)
Sodium: 140 mmol/L (ref 135–146)
Total Bilirubin: 0.6 mg/dL (ref 0.2–1.2)
Total Protein: 6.9 g/dL (ref 6.1–8.1)
eGFR: 106 mL/min/{1.73_m2} (ref >=60)

## 2022-09-17 LAB — HEMOGLOBIN A1C
Hgb A1c MFr Bld: 6.2 % of total Hgb — ABNORMAL HIGH (ref <5.7)
Mean Plasma Glucose: 131 mg/dL
eAG (mmol/L): 7.3 mmol/L

## 2022-09-17 LAB — LIPID PANEL
Cholesterol: 154 mg/dL (ref <200)
HDL: 52 mg/dL (ref >=50)
LDL Cholesterol (Calc): 82 mg/dL (calc)
Non-HDL Cholesterol (Calc): 102 mg/dL (calc) (ref <130)
Total CHOL/HDL Ratio: 3 (calc) (ref <5.0)
Triglycerides: 105 mg/dL (ref <150)

## 2022-09-17 LAB — TSH: TSH: 1.94 mIU/L

## 2022-09-18 LAB — BRAIN NATRIURETIC PEPTIDE: Brain Natriuretic Peptide: 13 pg/mL (ref <100)

## 2022-09-26 NOTE — Patient Instructions (Addendum)
Have given patient information on both [facility].  She could also consider bariatric surgery but right now she declines this option.  Her weight needs to improve.  She has not felt all that well since having hernia surgery in March 2023.  Vaccines reviewed.  Recommendations have been made.  Due for tetanus update in May 2025.

## 2022-09-29 ENCOUNTER — Encounter: Payer: Self-pay | Admitting: Cardiology

## 2022-09-29 ENCOUNTER — Telehealth: Payer: Self-pay | Admitting: Internal Medicine

## 2022-09-29 DIAGNOSIS — Z6841 Body Mass Index (BMI) 40.0 and over, adult: Secondary | ICD-10-CM

## 2022-09-29 NOTE — Telephone Encounter (Signed)
Patient called to follow up from 09/17/22, she stated that when she read the notes from Dr Lenord Fellers in [place] it stated that patient is not currently taking anything for blood pressure, Patient said she would like that corrected because she said she is currently taking lisinopril (ZESTRIL) 10 MG tablet daily

## 2022-09-30 ENCOUNTER — Encounter: Payer: Self-pay | Admitting: Internal Medicine

## 2022-11-10 ENCOUNTER — Other Ambulatory Visit: Payer: Self-pay | Admitting: Internal Medicine

## 2022-11-10 MED ORDER — OMEPRAZOLE 20 MG PO CPDR: 20.0000 mg | DELAYED_RELEASE_CAPSULE | Freq: Every day | ORAL | 0 refills | Status: DC

## 2022-11-10 NOTE — Telephone Encounter (Signed)
Patient is requesting a refill on her Rhonda Colon, last refill 03/21/22 #90 no refills.

## 2022-11-11 MED ORDER — CETIRIZINE HCL 10 MG PO TABS: 10.0000 mg | ORAL_TABLET | Freq: Every day | ORAL | 3 refills | Status: AC | PRN

## 2022-11-11 MED ORDER — ALPRAZOLAM 1 MG PO TABS: 1.0000 mg | ORAL_TABLET | Freq: Two times a day (BID) | ORAL | 0 refills | Status: DC | PRN

## 2022-11-11 NOTE — Telephone Encounter (Signed)
CPE scheduled  

## 2022-12-17 DIAGNOSIS — Z0289 Encounter for other administrative examinations: Secondary | ICD-10-CM

## 2022-12-22 NOTE — Progress Notes (Unsigned)
Cardiology Office Note:   Date:  12/23/2022  ID:  Rhonda Colon, DOB [birth year], MRN [id number] PCP: Margaree Mackintosh, MD  [facility] Providers Cardiologist:  Rollene Rotunda, MD {   History of Present Illness:   Rhonda Colon is a 47 y.o. female who presents for followup after a previous MI .  At a previous visit she was having SOB and had a normal BNP.  Since I last saw her she is no new cardiovascular problems.  She is [facility] down into [facility].  She has had no new chest pressure, neck or arm discomfort.  She has had no new shortness of breath, PND or orthopnea.  She has had no palpitations, presyncope or syncope.  She has chronic dyspnea and feels like the lungs may not be expanded.  She had previous pleural effusions following difficulties with robotic surgery to treat repair of a Morgagni hernia.  ROS: As stated in the HPI and negative for all other systems.  Studies Reviewed:    EKG:   EKG Interpretation Date/Time:  Wednesday December 23 2022 08:09:28 EDT Ventricular Rate:  105 PR Interval:  150 QRS Duration:  80 QT Interval:  356 QTC Calculation: 470 R Axis:   60  Text Interpretation: Sinus tachycardia Low voltage QRS When compared with ECG of 16-May-2021 16:03, Nonspecific T wave abnormality no longer evident in Anterolateral leads Confirmed by Rollene Rotunda ([id number]) on 12/23/2022 8:35:18 AM    Risk Assessment/Calculations:      Physical Exam:   VS:  BP 132/86   Pulse (!) 105   Ht 5' 4.5" (1.638 m)   Wt (!) 347 lb (157.4 kg)   SpO2 95%   BMI 58.64 kg/m    Wt Readings from Last 3 Encounters:  12/23/22 (!) 347 lb (157.4 kg)  09/17/22 (!) 344 lb 8 oz (156.3 kg)  09/15/22 (!) 343 lb (155.6 kg)     GEN: Well nourished, well developed in no acute distress NECK: No JVD; No carotid bruits CARDIAC: RRR, no murmurs, rubs, gallops RESPIRATORY:  Clear to auscultation without rales, wheezing or rhonchi  ABDOMEN: Soft, non-tender,  non-distended EXTREMITIES:  No edema; No deformity   ASSESSMENT AND PLAN:     CAD:     The patient has no new sypmtoms.  No further cardiovascular testing is indicated.  We will continue with aggressive risk reduction and meds as listed.   HL:     Her LDL was 82 with an HDL 52. Add Zetia 10 mg daily and check her lipids again in about 3 months.   SOB:   She has had a small pleural effusion.  I will repeat a chest x-ray.   OBESITY:   She is going to [facility]  SNORING: The patient has a STOP-BANG score of 7.  She has hypersomnolence and snoring, both symptoms.       Follow up with me in six months.   Signed, Rollene Rotunda, MD

## 2022-12-23 ENCOUNTER — Ambulatory Visit: Payer: 59 | Attending: Cardiology | Admitting: Cardiology

## 2022-12-23 ENCOUNTER — Encounter: Payer: Self-pay | Admitting: Cardiology

## 2022-12-23 ENCOUNTER — Ambulatory Visit
Admission: RE | Admit: 2022-12-23 | Discharge: 2022-12-23 | Disposition: A | Discharge status: Home / Self Care | Payer: 59 | Source: Ambulatory Visit | Attending: Cardiology | Admitting: Cardiology

## 2022-12-23 VITALS — BP 132/86 | HR 105 | Ht 64.5 in | Wt 347.0 lb

## 2022-12-23 DIAGNOSIS — R0602 Shortness of breath: Secondary | ICD-10-CM

## 2022-12-23 DIAGNOSIS — I2510 Atherosclerotic heart disease of native coronary artery without angina pectoris: Secondary | ICD-10-CM | POA: Diagnosis not present

## 2022-12-23 DIAGNOSIS — G4719 Other hypersomnia: Secondary | ICD-10-CM | POA: Diagnosis not present

## 2022-12-23 DIAGNOSIS — E785 Hyperlipidemia, unspecified: Secondary | ICD-10-CM

## 2022-12-23 DIAGNOSIS — J90 Pleural effusion, not elsewhere classified: Secondary | ICD-10-CM | POA: Diagnosis not present

## 2022-12-23 DIAGNOSIS — R0683 Snoring: Secondary | ICD-10-CM

## 2022-12-23 MED ORDER — EZETIMIBE 10 MG PO TABS: 10.0000 mg | ORAL_TABLET | Freq: Every day | ORAL | 3 refills | Status: DC

## 2022-12-23 NOTE — Patient Instructions (Addendum)
Medication Instructions:  START zetia 10mg  once daily for cholesterol Continue all other current medications  *If you need a refill on your cardiac medications before your next appointment, please call your pharmacy*   Lab Work: FASTING lipid panel in 3 months  If you have labs (blood work) drawn today and your tests are completely normal, you will receive your results only by: MyChart Message (if you have MyChart) OR A paper copy in the mail If you have any lab test that is abnormal or we need to change your treatment, we will call you to review the results.   Testing/Procedures: Chest X-Ray at [facility] - [address] [place]  Split Night sleep study at [place]. You will get a call to schedule this once approved with insurance.    Follow-Up: At [facility], you and your health needs are our priority.  As part of our continuing mission to provide you with exceptional heart care, we have created designated Provider Care Teams.  These Care Teams include your primary Cardiologist (physician) and Advanced Practice Providers (APPs -  Physician Assistants and Nurse Practitioners) who all work together to provide you with the care you need, when you need it.  We recommend signing up for the patient portal called "MyChart".  Sign up information is provided on this After Visit Summary.  MyChart is used to connect with patients for Virtual Visits (Telemedicine).  Patients are able to view lab/test results, encounter notes, upcoming appointments, etc.  Non-urgent messages can be sent to your provider as well.   To learn more about what you can do with MyChart, go to [email or web address].    Your next appointment:   6 months with Dr. Antoine Poche  Other Instructions  Dr. Crawford Givens  [facility] [address] [place], [place] [address] [phone]

## 2022-12-24 ENCOUNTER — Encounter: Payer: Self-pay | Admitting: Cardiology

## 2023-01-18 ENCOUNTER — Ambulatory Visit (INDEPENDENT_AMBULATORY_CARE_PROVIDER_SITE_OTHER): Payer: 59 | Admitting: Family Medicine

## 2023-01-18 ENCOUNTER — Encounter (INDEPENDENT_AMBULATORY_CARE_PROVIDER_SITE_OTHER): Payer: Self-pay | Admitting: Family Medicine

## 2023-01-18 VITALS — BP 143/87 | HR 95 | Temp 97.9°F | Ht 64.0 in | Wt 342.0 lb

## 2023-01-18 DIAGNOSIS — E669 Obesity, unspecified: Secondary | ICD-10-CM | POA: Diagnosis not present

## 2023-01-18 DIAGNOSIS — R0602 Shortness of breath: Secondary | ICD-10-CM

## 2023-01-18 DIAGNOSIS — R5383 Other fatigue: Secondary | ICD-10-CM | POA: Diagnosis not present

## 2023-01-18 DIAGNOSIS — E559 Vitamin D deficiency, unspecified: Secondary | ICD-10-CM | POA: Diagnosis not present

## 2023-01-18 DIAGNOSIS — Z6841 Body Mass Index (BMI) 40.0 and over, adult: Secondary | ICD-10-CM | POA: Diagnosis not present

## 2023-01-18 DIAGNOSIS — E785 Hyperlipidemia, unspecified: Secondary | ICD-10-CM | POA: Diagnosis not present

## 2023-01-18 DIAGNOSIS — Z1331 Encounter for screening for depression: Secondary | ICD-10-CM

## 2023-01-18 DIAGNOSIS — R739 Hyperglycemia, unspecified: Secondary | ICD-10-CM | POA: Diagnosis not present

## 2023-01-18 DIAGNOSIS — I2510 Atherosclerotic heart disease of native coronary artery without angina pectoris: Secondary | ICD-10-CM

## 2023-01-18 NOTE — Progress Notes (Signed)
Chief Complaint:   Rhonda Colon (MR# [id number]) is a 47 y.o. female who presents for evaluation and treatment of Rhonda and related comorbidities. Current BMI is Body mass index is 58.7 kg/m. Rhonda Colon has been struggling with her weight for many years and has been unsuccessful in either losing weight, maintaining weight loss, or reaching her healthy weight goal.  Patient was previously in our program in 2018.  Rhonda Colon is currently in the action stage of change and ready to dedicate time achieving and maintaining a healthier weight. Rhonda Colon is interested in becoming our patient and working on intensive lifestyle modifications including (but not limited to) diet and exercise for weight loss.  Rhonda Colon's habits were reviewed today and are as follows: her desired weight loss is 167 lbs, she has been heavy most of her life, she started gaining excessive weight at 47 years old, she has significant food cravings issues, she snacks frequently in the evenings, she skips meals frequently, she is frequently drinking liquids with calories, she frequently makes poor food choices, and she frequently eats larger portions than normal.  Depression Screen Hasset's Food and Mood (modified PHQ-9) score was 10.  Subjective:   1. Other fatigue Rhonda Colon admits to daytime somnolence and admits to waking up still tired. Patient has a history of symptoms of daytime fatigue and morning fatigue. Rhonda Colon generally gets 6 or 7 hours of sleep per night, and states that she has nightime awakenings. Snoring is present. Apneic episodes are present. Epworth Sleepiness Score is 1.   2. SOBOE (shortness of breath on exertion) Rhonda Colon notes increasing shortness of breath with exercising and seems to be worsening over time with weight gain. She notes getting out of breath sooner with activity than she used to. This has not gotten worse recently. Rhonda Colon denies shortness of breath at rest or orthopnea.  3. Vitamin D  deficiency Patient has a history of vitamin D deficiency, and she has no recent labs.  4. Hyperglycemia Patient has an elevated A1c of 6.2, and she is working on her diet and weight loss.  5. Dyslipidemia Patient is on statin and Zetia, and she is working on her diet and weight loss.  6. Coronary artery disease involving native coronary artery of native heart without angina pectoris Patient is status post MI and is followed by Cardiology.  She is working on her diet, and she denies chest pain.  She has never had to take nitroglycerin.  Assessment/Plan:   1. Other fatigue Rhonda Colon does feel that her weight is causing her energy to be lower than it should be. Fatigue may be related to Rhonda, depression or many other causes. Labs will be ordered, and in the meanwhile, Rhonda Colon will focus on self care including making healthy food choices, increasing physical activity and focusing on stress reduction.  - Vitamin B12 - CBC with Differential/Platelet - Folate - Magnesium - TSH  2. SOBOE (shortness of breath on exertion) Rhonda Colon does feel that she gets out of breath more easily that she used to when she exercises. Rhonda Colon's shortness of breath appears to be Rhonda related and exercise induced. She has agreed to work on weight loss and gradually increase exercise to treat her exercise induced shortness of breath. Will continue to monitor closely.  3. Vitamin D deficiency We will check labs today, we will follow-up at patient's next visit.  - VITAMIN D 25 Hydroxy (Vit-D Deficiency, Fractures)  4. Hyperglycemia We will check labs today.  Patient will start  her Category 3 plan, and we will follow-up at her next visit.  - CMP14+EGFR - Insulin, random - Hemoglobin A1c  5. Dyslipidemia We will check labs today.  Patient will start her Category 3 plan and will continue her medications as is.  - Lipid Panel With LDL/HDL Ratio  6. Coronary artery disease involving native coronary artery of native  heart without angina pectoris Patient will follow her Category 3 meal plan which is low in cholesterol and low in sodium.  7. Depression screening Rhonda Colon had a positive depression screening. Depression is commonly associated with Rhonda and often results in emotional eating behaviors. We will monitor this closely and work on CBT to help improve the non-hunger eating patterns. Referral to Psychology may be required if no improvement is seen as she continues in our clinic.  8. BMI 50.0-59.9, adult (HCC)  9. Rhonda, Beginnging BMI 58.7 Rhonda Colon is currently in the action stage of change and her goal is to continue with weight loss efforts. I recommend Rhonda Colon begin the structured treatment plan as follows:  She has agreed to the Category 3 Plan.  Exercise goals: No exercise has been prescribed for now, while we concentrate on nutritional changes.  Behavioral modification strategies: increasing lean protein intake, no skipping meals, and meal planning and cooking strategies.  She was informed of the importance of frequent follow-up visits to maximize her success with intensive lifestyle modifications for her multiple health conditions. She was informed we would discuss her lab results at her next visit unless there is a critical issue that needs to be addressed sooner. Rhonda Colon agreed to keep her next visit at the agreed upon time to discuss these results.  Objective:   Blood pressure (!) 143/87, pulse 95, temperature 97.9 F (36.6 C), height 5\' 4"  (1.626 m), weight (!) 342 lb (155.1 kg), SpO2 94%. Body mass index is 58.7 kg/m.  EKG: Normal sinus rhythm, rate 105 BPM.  Indirect Calorimeter completed today shows a VO2 of 528 and a REE of 3643.  Her calculated basal metabolic rate is 4132 thus her basal metabolic rate is better than expected.  Lab Results  Component Value Date   CREATININE 0.69 09/15/2022   BUN 8 09/15/2022   NA 140 09/15/2022   K 4.6 09/15/2022   CL 102 09/15/2022   CO2 30  09/15/2022   Lab Results  Component Value Date   ALT 38 (H) 09/15/2022   AST 38 (H) 09/15/2022   ALKPHOS 73 05/24/2021   BILITOT 0.6 09/15/2022   Lab Results  Component Value Date   HGBA1C 6.2 (H) 09/15/2022   HGBA1C 5.5 08/11/2021   HGBA1C CANCELED 07/30/2020   HGBA1C 5.6 07/30/2020   HGBA1C 5.6 03/28/2019   Lab Results  Component Value Date   INSULIN 19.7 08/31/2017   Lab Results  Component Value Date   TSH 1.94 09/15/2022   Lab Results  Component Value Date   CHOL 154 09/15/2022   HDL 52 09/15/2022   LDLCALC 82 09/15/2022   TRIG 105 09/15/2022   CHOLHDL 3.0 09/15/2022   Lab Results  Component Value Date   WBC 9.0 09/15/2022   HGB 15.2 09/15/2022   HCT 44.9 09/15/2022   MCV 91.1 09/15/2022   PLT 308 09/15/2022   No results found for: "IRON", "TIBC", "FERRITIN"  Attestation Statements:   Reviewed by clinician on day of visit: allergies, medications, problem list, medical history, surgical history, family history, social history, and previous encounter notes.  Time spent on visit including pre-visit  chart review and post-visit charting and care was 40 minutes.   I, Burt Knack, am acting as transcriptionist for Quillian Quince, MD.  I have reviewed the above documentation for accuracy and completeness, and I agree with the above. - Quillian Quince, MD

## 2023-01-19 DIAGNOSIS — Z1231 Encounter for screening mammogram for malignant neoplasm of breast: Secondary | ICD-10-CM | POA: Diagnosis not present

## 2023-01-19 LAB — HEMOGLOBIN A1C
Est. average glucose Bld gHb Est-mCnc: 134 mg/dL
Hgb A1c MFr Bld: 6.3 % — ABNORMAL HIGH (ref 4.8–5.6)

## 2023-01-19 LAB — LIPID PANEL WITH LDL/HDL RATIO
Cholesterol, Total: 143 mg/dL (ref 100–199)
HDL: 55 mg/dL (ref >39)
LDL Chol Calc (NIH): 73 mg/dL (ref 0–99)
LDL/HDL Ratio: 1.3 ratio (ref 0.0–3.2)
Triglycerides: 78 mg/dL (ref 0–149)
VLDL Cholesterol Cal: 15 mg/dL (ref 5–40)

## 2023-01-19 LAB — CBC WITH DIFFERENTIAL/PLATELET
Basophils Absolute: 0.1 10*3/uL (ref 0.0–0.2)
Basos: 1 %
EOS (ABSOLUTE): 0.1 10*3/uL (ref 0.0–0.4)
Eos: 1 %
Hematocrit: 47.5 % — ABNORMAL HIGH (ref 34.0–46.6)
Hemoglobin: 15.6 g/dL (ref 11.1–15.9)
Immature Grans (Abs): 0 10*3/uL (ref 0.0–0.1)
Immature Granulocytes: 0 %
Lymphocytes Absolute: 3.3 10*3/uL — ABNORMAL HIGH (ref 0.7–3.1)
Lymphs: 34 %
MCH: 30.8 pg (ref 26.6–33.0)
MCHC: 32.8 g/dL (ref 31.5–35.7)
MCV: 94 fL (ref 79–97)
Monocytes Absolute: 0.7 10*3/uL (ref 0.1–0.9)
Monocytes: 8 %
Neutrophils Absolute: 5.3 10*3/uL (ref 1.4–7.0)
Neutrophils: 56 %
Platelets: 321 10*3/uL (ref 150–450)
RBC: 5.06 x10E6/uL (ref 3.77–5.28)
RDW: 13 % (ref 11.7–15.4)
WBC: 9.5 10*3/uL (ref 3.4–10.8)

## 2023-01-19 LAB — FOLATE: Folate: >20 ng/mL (ref >3.0)

## 2023-01-19 LAB — CMP14+EGFR
ALT: 60 [IU]/L — ABNORMAL HIGH (ref 0–32)
AST: 66 [IU]/L — ABNORMAL HIGH (ref 0–40)
Albumin: 4.2 g/dL (ref 3.9–4.9)
Alkaline Phosphatase: 96 [IU]/L (ref 44–121)
BUN/Creatinine Ratio: 13 (ref 9–23)
BUN: 9 mg/dL (ref 6–24)
Bilirubin Total: 0.5 mg/dL (ref 0.0–1.2)
CO2: 23 mmol/L (ref 20–29)
Calcium: 9.6 mg/dL (ref 8.7–10.2)
Chloride: 102 mmol/L (ref 96–106)
Creatinine, Ser: 0.71 mg/dL (ref 0.57–1.00)
Globulin, Total: 2.7 g/dL (ref 1.5–4.5)
Glucose: 96 mg/dL (ref 70–99)
Potassium: 4.8 mmol/L (ref 3.5–5.2)
Sodium: 141 mmol/L (ref 134–144)
Total Protein: 6.9 g/dL (ref 6.0–8.5)
eGFR: 104 mL/min/{1.73_m2} (ref >59)

## 2023-01-19 LAB — HM MAMMOGRAPHY

## 2023-01-19 LAB — MAGNESIUM: Magnesium: 2.1 mg/dL (ref 1.6–2.3)

## 2023-01-19 LAB — INSULIN, RANDOM: INSULIN: 34.4 u[IU]/mL — ABNORMAL HIGH (ref 2.6–24.9)

## 2023-01-19 LAB — TSH: TSH: 1.01 u[IU]/mL (ref 0.450–4.500)

## 2023-01-19 LAB — VITAMIN D 25 HYDROXY (VIT D DEFICIENCY, FRACTURES): Vit D, 25-Hydroxy: 26 ng/mL — ABNORMAL LOW (ref 30.0–100.0)

## 2023-01-19 LAB — VITAMIN B12: Vitamin B-12: 402 pg/mL (ref 232–1245)

## 2023-01-20 ENCOUNTER — Telehealth: Payer: Self-pay

## 2023-01-20 NOTE — Telephone Encounter (Signed)
**Note De-Identified Eriko Economos Obfuscation** Authorization Number: [id number] Case Number: [id number]     Patient Name: Rhonda Colon, Rhonda Colon DOB: Aug 08, 1975 Status: Approved P2P Status:  Approval Date: 01/20/2023 11:19:05 AM Service Code: 29562 Service Description: Kendal Hymen >6 YRS >=4 ADD W/ PAP Site Name: [facility]  Start Date: 01/20/2023 Expiration Date: 07/19/2023 Date Last Updated: 01/20/2023 11:25:17 AM

## 2023-01-25 ENCOUNTER — Encounter: Payer: Self-pay | Admitting: Internal Medicine

## 2023-02-01 ENCOUNTER — Encounter (INDEPENDENT_AMBULATORY_CARE_PROVIDER_SITE_OTHER): Payer: Self-pay | Admitting: Family Medicine

## 2023-02-01 ENCOUNTER — Ambulatory Visit (INDEPENDENT_AMBULATORY_CARE_PROVIDER_SITE_OTHER): Payer: 59 | Admitting: Family Medicine

## 2023-02-01 VITALS — BP 128/77 | Ht 64.0 in | Wt 341.0 lb

## 2023-02-01 DIAGNOSIS — R7303 Prediabetes: Secondary | ICD-10-CM | POA: Diagnosis not present

## 2023-02-01 DIAGNOSIS — E785 Hyperlipidemia, unspecified: Secondary | ICD-10-CM

## 2023-02-01 DIAGNOSIS — E7849 Other hyperlipidemia: Secondary | ICD-10-CM

## 2023-02-01 DIAGNOSIS — R7989 Other specified abnormal findings of blood chemistry: Secondary | ICD-10-CM

## 2023-02-01 DIAGNOSIS — Z6841 Body Mass Index (BMI) 40.0 and over, adult: Secondary | ICD-10-CM

## 2023-02-01 DIAGNOSIS — E669 Obesity, unspecified: Secondary | ICD-10-CM | POA: Diagnosis not present

## 2023-02-01 DIAGNOSIS — I2510 Atherosclerotic heart disease of native coronary artery without angina pectoris: Secondary | ICD-10-CM | POA: Diagnosis not present

## 2023-02-01 DIAGNOSIS — E559 Vitamin D deficiency, unspecified: Secondary | ICD-10-CM

## 2023-02-01 MED ORDER — VITAMIN D (ERGOCALCIFEROL) 1.25 MG (50000 UNIT) PO CAPS
50000.0000 [IU] | ORAL_CAPSULE | ORAL | 0 refills | Status: DC
Start: 2023-02-01 — End: 2023-03-02

## 2023-02-01 NOTE — Progress Notes (Signed)
[facility]  Office: [phone]  /  Fax: [phone]  WEIGHT SUMMARY AND BIOMETRICS  Anthropometric Measurements Height: 5\' 4"  (1.626 m) Weight: (!) 341 lb (154.7 kg) BMI (Calculated): 58.5 Weight at Last Visit: 342 lb Weight Lost Since Last Visit: 1 lb Weight Gained Since Last Visit: 0 Starting Weight: 342 lb   Body Composition  Body Fat %: 57.7 % Fat Mass (lbs): 197 lbs Muscle Mass (lbs): 137.2 lbs Total Body Water (lbs): 107.2 lbs Visceral Fat Rating : 24   Other Clinical Data Fasting: No Labs: No Today's Visit #: 2 Starting Date: 01/18/23    Chief Complaint: OBESITY   History of Present Illness   The patient, with a history of obesity, coronary artery disease, and prediabetes, presents for a follow-up visit regarding her weight management and recent lab results. She reports adherence to the prescribed category three eating plan approximately 60% of the time, resulting in a one-pound weight loss over the past two weeks. The patient acknowledges the challenge of meal planning and expresses a commitment to improving dietary habits, with a focus on increasing protein intake.  The patient also reports joint aches, which she initially associated with a new medication (Zetia) prescribed by her cardiologist for cholesterol management. However, she notes that these symptoms seem to be improving. She also expresses a desire to manage her health conditions primarily through lifestyle modifications, rather than adding more medications to her regimen.  The patient has a family history of heart disease, with a personal history of a heart attack at the age of 38. She expresses a strong commitment to preventing further cardiac events through diet, exercise, and weight loss. She also mentions a history of sun allergies, which may contribute to her reported vitamin D deficiency.  The patient's recent lab results indicate elevated liver enzymes, which are suspected to be due to fatty liver  disease. Her hemoglobin A1c has also increased to 6.3, indicating worsening prediabetes. Her insulin level is significantly elevated at 34.4, suggesting that her body is producing excessive insulin, which may be contributing to her difficulty losing weight. Despite these challenges, the patient remains committed to improving her health through dietary changes and weight loss.          PHYSICAL EXAM:  Blood pressure 128/77, height 5\' 4"  (1.626 m), weight (!) 341 lb (154.7 kg), SpO2 95%. Body mass index is 58.53 kg/m.  DIAGNOSTIC DATA REVIEWED:  BMET    Component Value Date/Time   NA 141 01/18/2023 1045   K 4.8 01/18/2023 1045   CL 102 01/18/2023 1045   CO2 23 01/18/2023 1045   GLUCOSE 96 01/18/2023 1045   GLUCOSE 114 (H) 09/15/2022 0912   BUN 9 01/18/2023 1045   CREATININE 0.71 01/18/2023 1045   CREATININE 0.69 09/15/2022 0912   CALCIUM 9.6 01/18/2023 1045   GFRNONAA >60 06/04/2021 0502   GFRNONAA 97 07/30/2020 0944   GFRAA 113 07/30/2020 0944   Lab Results  Component Value Date   HGBA1C 6.3 (H) 01/18/2023   HGBA1C 5.3 08/10/2011   Lab Results  Component Value Date   INSULIN 34.4 (H) 01/18/2023   INSULIN 19.7 08/31/2017   Lab Results  Component Value Date   TSH 1.010 01/18/2023   CBC    Component Value Date/Time   WBC 9.5 01/18/2023 1045   WBC 9.0 09/15/2022 0912   RBC 5.06 01/18/2023 1045   RBC 4.93 09/15/2022 0912   HGB 15.6 01/18/2023 1045   HCT 47.5 (H) 01/18/2023 1045   PLT 321  01/18/2023 1045   MCV 94 01/18/2023 1045   MCH 30.8 01/18/2023 1045   MCH 30.8 09/15/2022 0912   MCHC 32.8 01/18/2023 1045   MCHC 33.9 09/15/2022 0912   RDW 13.0 01/18/2023 1045   Iron Studies No results found for: "IRON", "TIBC", "FERRITIN", "IRONPCTSAT" Lipid Panel     Component Value Date/Time   CHOL 143 01/18/2023 1045   TRIG 78 01/18/2023 1045   HDL 55 01/18/2023 1045   CHOLHDL 3.0 09/15/2022 0912   VLDL 9 06/14/2014 0907   LDLCALC 73 01/18/2023 1045   LDLCALC 82  09/15/2022 0912   Hepatic Function Panel     Component Value Date/Time   PROT 6.9 01/18/2023 1045   ALBUMIN 4.2 01/18/2023 1045   AST 66 (H) 01/18/2023 1045   ALT 60 (H) 01/18/2023 1045   ALKPHOS 96 01/18/2023 1045   BILITOT 0.5 01/18/2023 1045   BILIDIR <0.10 11/04/2020 1110   IBILI 0.6 06/14/2014 0853      Component Value Date/Time   TSH 1.010 01/18/2023 1045   Nutritional Lab Results  Component Value Date   VD25OH 26.0 (L) 01/18/2023   VD25OH CANCELED 07/30/2020   VD25OH 31 07/30/2020     Assessment and Plan    Obesity Lost 1 pound in the last two weeks. Struggling with meal planning and portion sizes. Discussed strategies for meal planning and portion control. -Continue with category three eating plan, when eating out keep calories around 400-500 with 35+ gm of protein, eating out handout given today. -Consider meal planning strategies to ensure adherence to diet.  Prediabetes A1C has increased to 6.3. Insulin levels are elevated, indicating insulin resistance. -Continue with dietary modifications and weight loss efforts. -Recheck labs in three months.  Vitamin D Deficiency Vitamin D levels have fallen again, which can contribute to fatigue and risk of osteoporosis. -Start Vitamin D supplement 50,000 IU weekly. -Recheck levels in three months.  Hyperlipidemia Cholesterol levels are close to being controlled on current medications (Crestor, Zetia). -Continue current medications. -Continue low cholesterol diet and weight loss efforts.  Elevated Liver Enzymes (Fatty Liver) Liver enzymes have increased, likely due to fatty liver. -Continue weight loss efforts. -Recheck liver enzymes in three months.  Coronary Artery Disease History of heart attack and current stent. Family history of heart disease. -Continue current medications. -Continue diet, exercise, and weight loss efforts to improve heart health.  Follow-up Next appointment already scheduled. If  needed, patient can see nurse practitioner or physician assistant if doctor is unavailable. Patient can send MyChart message if any issues arise before next appointment.         I have personally spent 40 minutes total time today in preparation, patient care, and documentation for this visit, including the following: review of clinical lab tests; review of medical tests/procedures/services.    She was informed of the importance of frequent follow up visits to maximize her success with intensive lifestyle modifications for her multiple health conditions.    Quillian Quince, MD

## 2023-02-09 ENCOUNTER — Other Ambulatory Visit: Payer: Self-pay | Admitting: Cardiology

## 2023-02-10 ENCOUNTER — Other Ambulatory Visit (HOSPITAL_COMMUNITY): Payer: Self-pay

## 2023-02-10 ENCOUNTER — Other Ambulatory Visit: Payer: Self-pay

## 2023-02-10 MED ORDER — ROSUVASTATIN CALCIUM 40 MG PO TABS
40.0000 mg | ORAL_TABLET | Freq: Every day | ORAL | 3 refills | Status: DC
Start: 2023-02-10 — End: 2023-09-02
  Filled 2023-07-30: qty 30, 30d supply, fill #0

## 2023-02-10 MED ORDER — LISINOPRIL 10 MG PO TABS: 10.0000 mg | ORAL_TABLET | Freq: Every day | ORAL | 3 refills | Status: DC

## 2023-02-10 NOTE — Addendum Note (Signed)
Addended by: Margaret Pyle D on: 02/10/2023 01:21 PM   Modules accepted: Orders

## 2023-02-10 NOTE — Telephone Encounter (Signed)
ERROR

## 2023-02-11 ENCOUNTER — Other Ambulatory Visit (HOSPITAL_COMMUNITY): Payer: Self-pay

## 2023-02-11 ENCOUNTER — Telehealth: Payer: Self-pay

## 2023-02-11 NOTE — Telephone Encounter (Signed)
Pharmacy Patient Advocate Encounter  Received notification from [facility] that Prior Authorization for OMEPRAZOLE 20MG  has been APPROVED from 02/10/23 to 02/10/24   PA #/Case ID/Reference #: [id number]

## 2023-02-16 ENCOUNTER — Ambulatory Visit (INDEPENDENT_AMBULATORY_CARE_PROVIDER_SITE_OTHER): Payer: 59 | Admitting: Family Medicine

## 2023-03-01 NOTE — Progress Notes (Unsigned)
SUBJECTIVE:  Chief Complaint: Obesity  Interim History: She is down 1 lbs since last visit.  Rhonda Colon, a 47 year old individual with a history of prediabetes, vitamin D deficiency, and coronary artery disease, presents for a follow-up visit regarding her obesity treatment plan. Recent weight loss of one pound, even over the Thanksgiving holiday. However, she acknowledges difficulty adhering to her diet plan during the holiday season, particularly with the upcoming Christmas celebrations. She expresses concern about the availability of snack Colon during these events, which she finds challenging to resist.  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 However, she notes an increase in movement due to accompanying her mother on outings to various stores, which she finds beneficial for both her mother and herself.   She reports good sleep quality but experiences nocturnal sweats, waking up around three or four in the morning. She expresses a need for better self-care strategies to manage the stress of running her own company and caring for her mother. We discussed bedtime yoga with Rhonda Colon to improve sleep and reduce pre-bedtime stress.   Regarding her diet, she expresses a preference for fish or chicken over red meat and acknowledges the need for better meal planning and preparation to ensure adequate protein intake. She expresses interest in trying new protein-rich food products and strategies to improve her diet.  Rhonda Colon is here to discuss her progress with her obesity treatment plan. She is on the Category 3 Plan and keeping a food journal and adhering to recommended goals of 400-500 calories and 35 grams  protein at meals and states she is following her eating plan approximately 70 % of the time. She states she is not exercising 0 minutes 0 times per  week.   OBJECTIVE: Visit Diagnoses: Problem List Items Addressed This Visit     CAD (coronary artery disease)   Obesity, Beginnging BMI 58.7   BMI 50.0-59.9, adult (HCC)   Vitamin D deficiency   Relevant Medications   Vitamin D, Ergocalciferol, (DRISDOL) 1.25 MG (50000 UNIT) CAPS capsule   Prediabetes - Primary   Menopause   Obesity Lost one pound despite the Thanksgiving holiday. Discussed challenges with diet adherence during holidays and strategies for hunger and portion control. Emphasized protein intake to curb hunger and maintain satiety. - Advise protein loading before large meals (e.g., Fairlife milk, string cheese) - Recommend making half of the meal protein and avoiding sides touching to control portions - Encourage continued weight monitoring and adherence to dietary strategies  Prediabetes Lab Results  Component Value Date   HGBA1C 6.3 (H) 01/18/2023   HGBA1C 6.2 (H) 09/15/2022   HGBA1C 5.5 08/11/2021   Lab Results  Component Value Date   LDLCALC 73 01/18/2023   CREATININE 0.71 01/18/2023    Discussed importance of balanced diet and regular physical activity . Continue working on nutrition plan to decrease simple carbohydrates, increase lean proteins and exercise to promote weight loss, improve glycemic control and prevent progression to Type 2 diabetes.  - Encourage adherence to dietary recommendations, particularly increasing protein intake - Advise regular physical activity to improve glucose metabolism  Coronary Artery Disease (CAD) Remote history of myocardial infarction. No specific discussion on current management or symptoms. Last follow up with Cardiology 12/23/22 with Dr. Antoine Poche: History of previous MI in 2013.  Recommended starting Zetia for cholesterol management with follow up lipids in 3 months and continue aggressive risk reduction  with medical treatment. No further CV testing at this time.  Advised to do sleep study at [facility] long for possible  sleep apnea for STOPBANG score of 7 with significant snoring and hypersomnolence.    Menopausal Symptoms (Hot Flashes) Experiencing hot flashes, particularly at night, leading to disrupted sleep. Previously tried gabapentin without success. Discussed non-pharmacological strategies to improve sleep quality. - Recommend sleep yoga with Rhonda Colon to help wind down and improve sleep quality - Suggest keeping a notepad by the bed to jot down thoughts and reduce nighttime anxiety  Vitamin D Deficiency Vitamin D is not at goal of 50.  Most recent vitamin D level was 26.0. She is on  prescription ergocalciferol 50,000 IU weekly. Lab Results  Component Value Date   VD25OH 26.0 (L) 01/18/2023   VD25OH CANCELED 07/30/2020   VD25OH 31 07/30/2020    Plan: Continue and refill  prescription ergocalciferol 50,000 IU weekly   General Health Maintenance Discussed dietary adjustments, physical activity, self-care, and stress management. Emphasized incorporating more fish and chicken into the diet and using Rhonda Colon for convenient, healthy protein options. - Recommend trying Rhonda Colon for convenient, healthy protein options - Encourage regular physical activity, including outings with her mother - Advise planning meals ahead to avoid unhealthy choices - Suggest incorporating more fish and chicken into the diet  Follow-up - Schedule follow-up appointment with Dr. Dalbert Garnet on January 8th at 11:40 AM.   Vitals Temp: 98.2 F (36.8 C) BP: (!) 150/93 Pulse Rate: 98 SpO2: 95 %   Anthropometric Measurements Height: 5\' 4"  (1.626 m) Weight: (!) 340 lb (154.2 kg) BMI (Calculated): 58.33 Weight at Last Visit: 341 lb Weight Lost Since Last Visit: 1 lb Weight Gained Since Last Visit: 0 Starting Weight: 342 lb Total Weight Loss (lbs): 0 lb (0 kg) Peak Weight: 342 lb   Body Composition  Body Fat %: 58.1 % Fat Mass (lbs): 197.8 lbs Muscle Mass (lbs): 135.6 lbs Total Body  Water (lbs): 108.6 lbs Visceral Fat Rating : 24   Other Clinical Data Fasting: no Labs: no Today's Visit #: 3 Starting Date: 01/18/23     ASSESSMENT AND PLAN:  Diet: Rhonda Colon is currently in the action stage of change. As such, her goal is to continue with weight loss efforts. She has agreed to Category 3 Plan and keeping a food journal and adhering to recommended goals of 400-500 calories and 35 grams of  protein at each meal.   Exercise: Rhonda Colon has been instructed that some exercise is better than none for weight loss and overall health benefits.   Behavior Modification:  We discussed the following Behavioral Modification Strategies today: increasing lean protein intake, decreasing simple carbohydrates, increasing vegetables, increase H2O intake, increase high fiber Colon, no skipping meals, meal planning and cooking strategies, holiday eating strategies, planning for success, and keep a strict food journal. We discussed various medication options to help Rhonda Colon with her weight loss efforts and we both agreed to continue to work on nutritional and behavioral strategies to promote weight loss.  .  Return in about 4 weeks (around 03/30/2023).[name] She was informed of the importance of frequent follow up visits to maximize her success with intensive lifestyle modifications for her multiple health conditions.  Attestation Statements:   Reviewed by clinician on day of visit: allergies, medications, problem list, medical history, surgical history, family history, social history, and previous encounter notes.   Time spent on visit including pre-visit chart review and post-visit care and charting was 41  minutes.    Krishan Mcbreen, PA-C

## 2023-03-02 ENCOUNTER — Encounter (INDEPENDENT_AMBULATORY_CARE_PROVIDER_SITE_OTHER): Payer: Self-pay | Admitting: Physician Assistant

## 2023-03-02 ENCOUNTER — Ambulatory Visit (INDEPENDENT_AMBULATORY_CARE_PROVIDER_SITE_OTHER): Payer: 59 | Admitting: Physician Assistant

## 2023-03-02 VITALS — BP 150/93 | HR 98 | Temp 98.2°F | Ht 64.0 in | Wt 340.0 lb

## 2023-03-02 DIAGNOSIS — R7303 Prediabetes: Secondary | ICD-10-CM | POA: Insufficient documentation

## 2023-03-02 DIAGNOSIS — E669 Obesity, unspecified: Secondary | ICD-10-CM

## 2023-03-02 DIAGNOSIS — E559 Vitamin D deficiency, unspecified: Secondary | ICD-10-CM | POA: Diagnosis not present

## 2023-03-02 DIAGNOSIS — Z6841 Body Mass Index (BMI) 40.0 and over, adult: Secondary | ICD-10-CM | POA: Diagnosis not present

## 2023-03-02 DIAGNOSIS — Z780 Asymptomatic menopausal state: Secondary | ICD-10-CM | POA: Insufficient documentation

## 2023-03-02 DIAGNOSIS — I2510 Atherosclerotic heart disease of native coronary artery without angina pectoris: Secondary | ICD-10-CM

## 2023-03-02 MED ORDER — VITAMIN D (ERGOCALCIFEROL) 1.25 MG (50000 UNIT) PO CAPS
50000.0000 [IU] | ORAL_CAPSULE | ORAL | 0 refills | Status: DC
Start: 2023-03-02 — End: 2023-11-30

## 2023-04-07 ENCOUNTER — Ambulatory Visit (INDEPENDENT_AMBULATORY_CARE_PROVIDER_SITE_OTHER): Payer: 59 | Admitting: Family Medicine

## 2023-04-14 ENCOUNTER — Telehealth (INDEPENDENT_AMBULATORY_CARE_PROVIDER_SITE_OTHER): Payer: Self-pay | Admitting: Family Medicine

## 2023-04-14 NOTE — Telephone Encounter (Signed)
 Pharmacy called about a prescription for the pt, call transferred to the CMA.

## 2023-04-30 ENCOUNTER — Other Ambulatory Visit: Payer: Self-pay | Admitting: Internal Medicine

## 2023-06-08 ENCOUNTER — Other Ambulatory Visit: Payer: Self-pay | Admitting: Internal Medicine

## 2023-06-18 ENCOUNTER — Encounter: Payer: Self-pay | Admitting: Cardiology

## 2023-06-21 ENCOUNTER — Ambulatory Visit (INDEPENDENT_AMBULATORY_CARE_PROVIDER_SITE_OTHER): Admitting: Internal Medicine

## 2023-06-21 VITALS — BP 122/80 | HR 103 | Temp 98.2°F | Resp 14 | Ht 64.0 in | Wt 359.8 lb

## 2023-06-21 DIAGNOSIS — F5101 Primary insomnia: Secondary | ICD-10-CM | POA: Diagnosis not present

## 2023-06-21 DIAGNOSIS — E119 Type 2 diabetes mellitus without complications: Secondary | ICD-10-CM

## 2023-06-21 DIAGNOSIS — F32A Depression, unspecified: Secondary | ICD-10-CM | POA: Diagnosis not present

## 2023-06-21 DIAGNOSIS — I10 Essential (primary) hypertension: Secondary | ICD-10-CM

## 2023-06-21 DIAGNOSIS — I252 Old myocardial infarction: Secondary | ICD-10-CM

## 2023-06-21 DIAGNOSIS — F419 Anxiety disorder, unspecified: Secondary | ICD-10-CM | POA: Diagnosis not present

## 2023-06-21 DIAGNOSIS — F411 Generalized anxiety disorder: Secondary | ICD-10-CM | POA: Diagnosis not present

## 2023-06-21 DIAGNOSIS — Z6841 Body Mass Index (BMI) 40.0 and over, adult: Secondary | ICD-10-CM

## 2023-06-21 DIAGNOSIS — T43225A Adverse effect of selective serotonin reuptake inhibitors, initial encounter: Secondary | ICD-10-CM

## 2023-06-21 NOTE — Progress Notes (Signed)
 Patient Care Team: Margaree Mackintosh, MD as PCP - General (Internal Medicine) Rollene Rotunda, MD as PCP - Cardiology (Cardiology) Corliss Skains, MD as Consulting Physician (Cardiothoracic Surgery)  Visit Date: 06/21/23  Subjective:   Chief Complaint  Patient presents with   having trouble sleeping    Has taken Tylenol PM   [name],Female DOB:09-Mar-1976,47 y.o. AVW:[id number]   48 y.o. Female presents today for acute visit with Insomnia. Patient has a past medical history of Anxiety managed with Xanax 0.5 - 1 mg as needed; Situational Stress due to her mother. Says the she has been having difficulty staying asleep due to crazy, vivid, bad dreams. She mentioned difficulty sleeping due to snoring waking her up to Dr. Antoine Poche, who has referred her for a sleep study that she will do in May. She says that she abruptly stopped her Fluoxetine about 9 weeks ago because she thought her vivid dreams may be contributed by the Prozac, but this has not helped her sleep quality at all. Has been taking Tylenol PM without relief, and says that she has been waking up around 2 AM and staying awake until she has to leave for work.  Past Medical History:  Diagnosis Date   Anxiety    Back pain    CAD (coronary artery disease) 07/2011   cardiologist--- dr Antoine Poche--   a. 07/2011 NSTEMI/Cath: RCA 99%m, otw nl cors, EF 50%, inf hk -> RCA stented with 3.0x61mm Promus DES   Depression    Fatty liver    GERD (gastroesophageal reflux disease)    Heart disease    History of non-ST elevation myocardial infarction (NSTEMI) 07/2011   s/p PCI and stenting   History of sepsis 04/2021   hospital admission---  sepsis due to infected mesh post op diaphramatic hernia repair,  intrathoracic abscess w/ pleural effusion   Hyperlipidemia    Hypertension    Morgagni hernia    followed by dr h. littlefoot (cardiac / thoracic surgeon)  lov note in epic 10-17-2021 pt released prn//    a. Discovered  incidentally on CT 07/2011  (diaphragmatic hernia);    02-24-2021  s/p repair with mesh;  hernia recurrence 04-04-2021/  02/ 2023 infected mesh w/ sepsis,  s/p unroof abscess/ pleural irrigation system placed/   re-do hernia repair 06-02-2021   Myocardial infarction [facility])    2013   Obesity    Obesity    S/P drug eluting coronary stent placement 08/10/2011   DES x1  to  mRCA   Sinus bradycardia    a. Nocturnal, asymptomatic   Sun allergy    per pt dx via testing    Allergies  Allergen Reactions   Wound Dressing Adhesive Dermatitis and Rash    Family History  Problem Relation Age of Onset   Diabetes Mother    Coronary artery disease Mother 33        stent   Heart attack Mother    Heart disease Mother    Hyperlipidemia Mother    Obesity Mother    Colon polyps Mother    Hypertension Father    Arrhythmia Father    Depression Father    Coronary artery disease Maternal Aunt 35       (Mother's twin sister)   Lung cancer Maternal Aunt    Heart attack Maternal Aunt        mother's twin   Colon polyps Maternal Aunt    Colon cancer Maternal Uncle    Coronary artery  disease Maternal Uncle 46   Heart attack Maternal Uncle    Breast cancer Maternal Grandmother    Breast cancer Cousin 73   Cancer Neg Hx    Early death Neg Hx    Kidney disease Neg Hx    Stroke Neg Hx    Alcohol abuse Neg Hx    Drug abuse Neg Hx    Social History   Social History Narrative   Lives alone.  Occasional EtOH.  Works as a Veterinary surgeon.   Review of Systems  Psychiatric/Behavioral:  The patient has insomnia.   All other systems reviewed and are negative.    Objective:  Vitals: BP 122/80 (BP Location: Right Arm, Cuff Size: Large)   Pulse (!) 103   Temp 98.2 F (36.8 C) (Temporal)   Ht 5\' 4"  (1.626 m)   SpO2 95%   BMI 58.36 kg/m   Physical Exam Vitals and nursing note reviewed.  Constitutional:      General: She is not in acute distress.    Appearance: Normal appearance. She is not  toxic-appearing.  HENT:     Head: Normocephalic and atraumatic.  Pulmonary:     Effort: Pulmonary effort is normal.  Skin:    General: Skin is warm and dry.  Neurological:     Mental Status: She is alert and oriented to person, place, and time. Mental status is at baseline.  Psychiatric:        Mood and Affect: Mood normal.        Behavior: Behavior normal.        Thought Content: Thought content normal.        Judgment: Judgment normal.     Results:  Studies Obtained And Personally Reviewed By Me: Labs:     Component Value Date/Time   NA 141 01/18/2023 1045   K 4.8 01/18/2023 1045   CL 102 01/18/2023 1045   CO2 23 01/18/2023 1045   GLUCOSE 96 01/18/2023 1045   GLUCOSE 114 (H) 09/15/2022 0912   BUN 9 01/18/2023 1045   CREATININE 0.71 01/18/2023 1045   CREATININE 0.69 09/15/2022 0912   CALCIUM 9.6 01/18/2023 1045   PROT 6.9 01/18/2023 1045   ALBUMIN 4.2 01/18/2023 1045   AST 66 (H) 01/18/2023 1045   ALT 60 (H) 01/18/2023 1045   ALKPHOS 96 01/18/2023 1045   BILITOT 0.5 01/18/2023 1045   GFRNONAA >60 06/04/2021 0502   GFRNONAA 97 07/30/2020 0944   GFRAA 113 07/30/2020 0944    Lab Results  Component Value Date   WBC 9.5 01/18/2023   HGB 15.6 01/18/2023   HCT 47.5 (H) 01/18/2023   MCV 94 01/18/2023   PLT 321 01/18/2023   Lab Results  Component Value Date   CHOL 143 01/18/2023   HDL 55 01/18/2023   LDLCALC 73 01/18/2023   TRIG 78 01/18/2023   CHOLHDL 3.0 09/15/2022   Lab Results  Component Value Date   HGBA1C 6.3 (H) 01/18/2023    Lab Results  Component Value Date   TSH 1.010 01/18/2023    Assessment & Plan:  Insomnia; Anxiety State: been having difficulty staying asleep due to crazy, vivid, bad dreams and will wake up around 2 AM, staying awake until she has to get up. Has a sleep study in May for evaluation of OSA per her Cardiologist, Dr. Antoine Poche. Stopped her Fluoxetine suddenly about 9 weeks ago because she thought her vivid dreams may be contributed  by the Prozac, but this has not helped her sleep quality at  all. Recommended that she take Xanax 1 mg at bedtime.  Sleep apnea- has CPAP but does not keep it on all night  Obesity  Anxiety disorder  Hx of depression  Essential HTN'  Hyperlipidemia  GERD  Discontinuation syndrome from stopping Prozac abruptly  Morgagni hernia repair March 2023  I,Emily Lagle,acting as a Neurosurgeon for Margaree Mackintosh, MD.,have documented all relevant documentation on the behalf of Margaree Mackintosh, MD,as directed by  Margaree Mackintosh, MD while in the presence of Margaree Mackintosh, MD.   I, Margaree Mackintosh, MD, have reviewed all documentation for this visit. The documentation on 06/22/23 for the exam, diagnosis, procedures, and orders are all accurate and complete.

## 2023-06-22 ENCOUNTER — Encounter: Payer: Self-pay | Admitting: Internal Medicine

## 2023-06-22 NOTE — Patient Instructions (Addendum)
 Stopped Prozac abruptly about 9 weeks ago. May have discontinuation syndrome from stopping SSRI abruptly in addition to anxiety disorder. Also has sleep apnea. Prescribed Xanax 1 mg at bedtime for sleep. May need Sleep consult with Pulmonary if not improving.

## 2023-06-25 ENCOUNTER — Ambulatory Visit: Payer: 59 | Admitting: Cardiology

## 2023-07-22 ENCOUNTER — Ambulatory Visit (INDEPENDENT_AMBULATORY_CARE_PROVIDER_SITE_OTHER): Admitting: Psychology

## 2023-07-22 DIAGNOSIS — F331 Major depressive disorder, recurrent, moderate: Secondary | ICD-10-CM

## 2023-07-22 DIAGNOSIS — F411 Generalized anxiety disorder: Secondary | ICD-10-CM | POA: Diagnosis not present
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
## 2023-07-29 ENCOUNTER — Ambulatory Visit (INDEPENDENT_AMBULATORY_CARE_PROVIDER_SITE_OTHER): Admitting: Psychology

## 2023-07-29 DIAGNOSIS — F331 Major depressive disorder, recurrent, moderate: Secondary | ICD-10-CM | POA: Diagnosis not present

## 2023-07-29 DIAGNOSIS — F411 Generalized anxiety disorder: Secondary | ICD-10-CM
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
## 2023-07-30 ENCOUNTER — Other Ambulatory Visit (HOSPITAL_BASED_OUTPATIENT_CLINIC_OR_DEPARTMENT_OTHER): Payer: Self-pay

## 2023-07-30 ENCOUNTER — Ambulatory Visit (INDEPENDENT_AMBULATORY_CARE_PROVIDER_SITE_OTHER): Admitting: Internal Medicine

## 2023-07-30 VITALS — BP 124/74 | HR 100 | Temp 98.0°F | Ht 64.5 in | Wt 369.8 lb

## 2023-07-30 DIAGNOSIS — Z6841 Body Mass Index (BMI) 40.0 and over, adult: Secondary | ICD-10-CM

## 2023-07-30 DIAGNOSIS — F5101 Primary insomnia: Secondary | ICD-10-CM

## 2023-07-30 DIAGNOSIS — I10 Essential (primary) hypertension: Secondary | ICD-10-CM

## 2023-07-30 DIAGNOSIS — F439 Reaction to severe stress, unspecified: Secondary | ICD-10-CM | POA: Diagnosis not present

## 2023-07-30 DIAGNOSIS — I252 Old myocardial infarction: Secondary | ICD-10-CM

## 2023-07-30 DIAGNOSIS — F418 Other specified anxiety disorders: Secondary | ICD-10-CM | POA: Diagnosis not present

## 2023-07-30 DIAGNOSIS — E119 Type 2 diabetes mellitus without complications: Secondary | ICD-10-CM

## 2023-07-30 DIAGNOSIS — F419 Anxiety disorder, unspecified: Secondary | ICD-10-CM

## 2023-07-30 MED ORDER — BUPROPION HCL ER (SR) 150 MG PO TB12
150.0000 mg | ORAL_TABLET | Freq: Two times a day (BID) | ORAL | 1 refills | Status: DC
  Filled 2023-07-30: qty 60, 30d supply, fill #0

## 2023-07-30 MED FILL — Omeprazole Cap Delayed Release 20 MG: ORAL | 30 days supply | Qty: 30 | Fill #0 | Status: AC

## 2023-07-30 NOTE — Progress Notes (Signed)
 Patient Care Team: [name], MD as PCP - General (Internal Medicine) Rhonda Grates, MD as PCP - Cardiology (Cardiology) Hilarie Lovely, MD as Consulting Physician (Cardiothoracic Surgery)  Visit Date: 07/30/23  Subjective:   Chief Complaint  Patient presents with   Medication Problem    Patient wants to discuss new meds from [name].   Patient VW:UJWJX [name],Female DOB:Jun 14, 1975,47 y.o. BJY:[id number]   48 y.o. Female presents today for acute visit with Situational Stress. Patient has a past medical history of Anxiety/Depression. She was last seen in this office on March 24th for insomnia, which was though to be related to a sudden discontinuation of her SSRI 9 weeks prior. Since then she has seen Rhonda Greening, PhD with [facility], which has been helpful according to her, however she is still having issues sleeping and with anxiety. Has an upcoming sleep study on 5/18 at [facility]. 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   Past Medical History:  Diagnosis Date   Anxiety    Back pain    CAD (coronary artery disease) 07/2011   cardiologist--- dr Lavonne Prairie--   a. 07/2011 NSTEMI/Cath: RCA 99%m, otw nl cors, EF 50%, inf hk -> RCA stented with 3.0x39mm Promus DES   Depression    Fatty liver    GERD (gastroesophageal reflux disease)    Heart disease    History of non-ST elevation myocardial infarction (NSTEMI) 07/2011   s/p PCI and stenting   History of sepsis 04/2021   hospital admission---  sepsis due to infected mesh post op diaphramatic hernia repair,  intrathoracic abscess w/ pleural effusion   Hyperlipidemia    Hypertension    Morgagni hernia    followed by dr h. littlefoot (cardiac / thoracic surgeon)  lov note in epic 10-17-2021 pt released prn//    a. Discovered incidentally on CT 07/2011  (diaphragmatic hernia);    02-24-2021  s/p repair with mesh;  hernia recurrence 04-04-2021/   02/ 2023 infected mesh w/ sepsis,  s/p unroof abscess/ pleural irrigation system placed/   re-do hernia repair 06-02-2021   Myocardial infarction [facility])    2013   Obesity    Obesity    S/P drug eluting coronary stent placement 08/10/2011   DES x1  to  mRCA   Sinus bradycardia    a. Nocturnal, asymptomatic   Sun allergy    per pt dx via testing    Allergies  Allergen Reactions   Wound Dressing Adhesive Dermatitis and Rash    Family History  Problem Relation Age of Onset   Diabetes Mother    Coronary artery disease Mother 33        stent   Heart attack Mother    Heart disease Mother    Hyperlipidemia Mother    Obesity Mother    Colon polyps Mother    Hypertension Father    Arrhythmia Father    Depression Father    Coronary artery disease Maternal Aunt 61       (Mother's twin sister)   Lung cancer Maternal Aunt    Heart attack Maternal Aunt        mother's twin   Colon polyps Maternal Aunt    Colon cancer Maternal Uncle    Coronary artery disease Maternal Uncle 46   Heart attack Maternal Uncle    Breast cancer Maternal Grandmother    Breast cancer Cousin 46  Cancer Neg Hx    Early death Neg Hx    Kidney disease Neg Hx    Stroke Neg Hx    Alcohol abuse Neg Hx    Drug abuse Neg Hx    Social History   Social History Narrative   Lives alone.  Occasional EtOH.  Works as a Veterinary surgeon.   Review of Systems  Psychiatric/Behavioral:         (+) Situational Stress  All other systems reviewed and are negative.    Objective:  Vitals: BP 124/74   Pulse 100   Temp 98 F (36.7 C) (Temporal)   Ht 5' 4.5" (1.638 m)   Wt (!) 369 lb 12.8 oz (167.7 kg)   SpO2 92%   BMI 62.50 kg/m   Physical Exam Vitals and nursing note reviewed.  Constitutional:      General: She is not in acute distress.    Appearance: Normal appearance. She is not toxic-appearing.  HENT:     Head: Normocephalic and atraumatic.  Pulmonary:     Effort: Pulmonary effort is normal.  Skin:    General:  Skin is warm and dry.  Neurological:     Mental Status: She is alert and oriented to person, place, and time. Mental status is at baseline.  Psychiatric:        Mood and Affect: Mood normal.        Behavior: Behavior normal.        Thought Content: Thought content normal.        Judgment: Judgment normal.     Results:  Studies Obtained And Personally Reviewed By Me: Labs:     Component Value Date/Time   NA 141 01/18/2023 1045   K 4.8 01/18/2023 1045   CL 102 01/18/2023 1045   CO2 23 01/18/2023 1045   GLUCOSE 96 01/18/2023 1045   GLUCOSE 114 (H) 09/15/2022 0912   BUN 9 01/18/2023 1045   CREATININE 0.71 01/18/2023 1045   CREATININE 0.69 09/15/2022 0912   CALCIUM  9.6 01/18/2023 1045   PROT 6.9 01/18/2023 1045   ALBUMIN  4.2 01/18/2023 1045   AST 66 (H) 01/18/2023 1045   ALT 60 (H) 01/18/2023 1045   ALKPHOS 96 01/18/2023 1045   BILITOT 0.5 01/18/2023 1045   GFRNONAA >60 06/04/2021 0502   GFRNONAA 97 07/30/2020 0944   GFRAA 113 07/30/2020 0944    Lab Results  Component Value Date   WBC 9.5 01/18/2023   HGB 15.6 01/18/2023   HCT 47.5 (H) 01/18/2023   MCV 94 01/18/2023   PLT 321 01/18/2023   Lab Results  Component Value Date   CHOL 143 01/18/2023   HDL 55 01/18/2023   LDLCALC 73 01/18/2023   TRIG 78 01/18/2023   CHOLHDL 3.0 09/15/2022   Lab Results  Component Value Date   HGBA1C 6.3 (H) 01/18/2023    Lab Results  Component Value Date   TSH 1.010 01/18/2023    Assessment & Plan:  Situational Stress; Insomnia; Anxiety/Depression treated with Xanax  1 mg as needed. Seen in this office on March 24th for insomnia, possible related to a sudden discontinuation of her SSRI 9 weeks prior. Since then, she has seen Rhonda Greening, PhD with [facility], which has been helpful, however she is still having issues sleeping and with anxiety. Has an upcoming sleep study on 5/18 at [facility]. Says that she is not very motivated to exercise, or work though she is still  working some as a Veterinary surgeon. Have asked Dr.  Robbert Childes for psychiatrists recommendations. Will send in Wellbutrin -SR 150 mg twice daily.   Long discussion with her today about her situation. She lacks motivation to diet and exercise. Continue to encourage this and continue counseling.  Time spent in office with patient is 40 minutes  Upcoming annual visit in June, will discuss health maintenance and vaccine counseling then.     I,Rhonda Colon,acting as a Neurosurgeon for [name], MD.,have documented all relevant documentation on the behalf of [name], MD,as directed by  [name], MD while in the presence of [name], MD.   I, [name], MD, have reviewed all documentation for this visit. The documentation on 08/08/23 for the exam, diagnosis, procedures, and orders are all accurate and complete.

## 2023-08-02 ENCOUNTER — Other Ambulatory Visit (HOSPITAL_BASED_OUTPATIENT_CLINIC_OR_DEPARTMENT_OTHER): Payer: Self-pay

## 2023-08-02 ENCOUNTER — Other Ambulatory Visit: Payer: Self-pay

## 2023-08-03 ENCOUNTER — Ambulatory Visit (INDEPENDENT_AMBULATORY_CARE_PROVIDER_SITE_OTHER): Admitting: Psychology

## 2023-08-03 DIAGNOSIS — F331 Major depressive disorder, recurrent, moderate: Secondary | ICD-10-CM | POA: Diagnosis not present

## 2023-08-03 DIAGNOSIS — F411 Generalized anxiety disorder: Secondary | ICD-10-CM | POA: Diagnosis not present
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
## 2023-08-05 ENCOUNTER — Other Ambulatory Visit (HOSPITAL_BASED_OUTPATIENT_CLINIC_OR_DEPARTMENT_OTHER): Payer: Self-pay

## 2023-08-08 ENCOUNTER — Encounter: Payer: Self-pay | Admitting: Internal Medicine

## 2023-08-08 NOTE — Patient Instructions (Signed)
 Try Wellbutrin  for help with motivation. Follow up here in June.

## 2023-08-10 ENCOUNTER — Ambulatory Visit (INDEPENDENT_AMBULATORY_CARE_PROVIDER_SITE_OTHER): Admitting: Psychology

## 2023-08-10 DIAGNOSIS — F411 Generalized anxiety disorder: Secondary | ICD-10-CM | POA: Diagnosis not present

## 2023-08-10 DIAGNOSIS — F331 Major depressive disorder, recurrent, moderate: Secondary | ICD-10-CM
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
## 2023-08-13 ENCOUNTER — Other Ambulatory Visit (HOSPITAL_BASED_OUTPATIENT_CLINIC_OR_DEPARTMENT_OTHER): Payer: Self-pay

## 2023-08-15 ENCOUNTER — Ambulatory Visit (HOSPITAL_BASED_OUTPATIENT_CLINIC_OR_DEPARTMENT_OTHER): Attending: Cardiology | Admitting: Cardiovascular Disease

## 2023-08-15 DIAGNOSIS — G4736 Sleep related hypoventilation in conditions classified elsewhere: Secondary | ICD-10-CM | POA: Diagnosis not present

## 2023-08-15 DIAGNOSIS — G4719 Other hypersomnia: Secondary | ICD-10-CM | POA: Diagnosis not present

## 2023-08-15 DIAGNOSIS — R0683 Snoring: Secondary | ICD-10-CM | POA: Diagnosis not present

## 2023-08-15 DIAGNOSIS — G4733 Obstructive sleep apnea (adult) (pediatric): Secondary | ICD-10-CM | POA: Insufficient documentation

## 2023-08-19 ENCOUNTER — Ambulatory Visit (INDEPENDENT_AMBULATORY_CARE_PROVIDER_SITE_OTHER): Admitting: Psychology

## 2023-08-19 DIAGNOSIS — F331 Major depressive disorder, recurrent, moderate: Secondary | ICD-10-CM | POA: Diagnosis not present

## 2023-08-19 DIAGNOSIS — F411 Generalized anxiety disorder: Secondary | ICD-10-CM | POA: Diagnosis not present
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
## 2023-08-23 ENCOUNTER — Encounter (HOSPITAL_BASED_OUTPATIENT_CLINIC_OR_DEPARTMENT_OTHER): Payer: Self-pay | Admitting: Cardiovascular Disease

## 2023-08-23 NOTE — Progress Notes (Signed)
 [facility] [address] [place], [place] [address] Tel: [phone]   Fax: [phone]  Split Night Interpretation  Patient Name:  Rhonda Colon, Rhonda Colon Date:  08/15/2023 Referring Physician:  Eilleen Grates, MD  Indications for Polysomnography The patient is a 48 year old Female who is 5\' 4"  and weighs 369.0 lbs.  Her BMI equals 63.8.  A diagnostic polysomnogram was performed to evaluate for OSA.  After 126.0 minutes of sleep time the patient exhibited sufficient respiratory events qualifying her for a CPAP trial which was then initiated.    Medication  Xanax   Tylenol   Xanax    Polysomnogram Data A full night polysomnogram was performed recording the standard physiologic parameters including EEG, EOG, EMG, EKG, nasal and oral airflow.  Respiratory parameters of chest and abdominal movements are recorded with [place] motion transducers.  Oxygen saturation was recorded by pulse oximetry.    Sleep Architecture The total recording time of the diagnostic portion of the study was 220.6 minutes.  The total sleep time was 126.0 minutes.  During the diagnostic portion of the study, the patient spent 16.7% of total sleep time in Stage N1, 70.2% in Stage N2, 13.1% in Stages N3, and 0.0% in REM.   Sleep latency was 21.5 minutes.  REM latency was 0 minutes.  Sleep Efficiency was 57.1%.  Wake after Sleep Onset time was 73.0 minutes.   At 02:12:35 AM the patient was placed on PAP treatment and was titrated at pressures ranging from 5* cm/H20 with supplemental oxygen at 0 up to 15* cm/H20 without supplemental oxygen.  The total recording time of the treatment portion of the study was 189.8 minutes.  The total sleep time was 137.0 minutes.  During the treatment portion of the study, the patient spent 2.9% of total sleep time in Stage N1, 52.9% in Stage N2, 1.1% in Stages N3, and 43.1% in REM.   Sleep latency was 53.0 minutes.  REM latency was 50.0 minutes.  Sleep  Efficiency was 72.2%.  Wake after Sleep Onset time was - minutes.  Respiratory Events During the diagnostic portion of the study, the polysomnogram revealed a presence of 1 obstructive, 2 central, and - mixed apneas resulting in an Apnea index of 1.4 events per hour.  There were 175 hypopneas (>=3% desaturation and/or arousal) resulting in an Apnea\Hypopnea Index (AHI >=3% desaturation and/or arousal) of 84.8 events per hour.  There were 164 hypopneas (>=4% desaturation) resulting in an Apnea\Hypopnea Index (AHI >=4% desaturation) of 79.5 events per hour.  There were 1 Respiratory Effort Related Arousals resulting in a RERA index of 0.5 events per hour. The Respiratory Disturbance Index is 85.2 events per hour.  The snore index was 4.3 events per hour.  Mean oxygen saturation was 88.2%.  The lowest oxygen saturation during sleep was 74.0%.  Time spent <=88% oxygen saturation was 110.0 minutes (52.0%).    During the treatment portion of the study, the polysomnogram revealed a presence of - obstructive, 1 central, and - mixed apneas resulting in an Apnea index of 0.4 events per hour.  There were 34 hypopneas (>=3% desaturation and/or arousal) resulting in an Apnea\Hypopnea Index (AHI >=3% desaturation and/or arousal) of 15.3 events per hour.  There were 24 hypopneas (>=4% desaturation) resulting in an Apnea\Hypopnea Index (AHI >=4% desaturation) of 10.9 events per hour.  There were - Respiratory Effort Related Arousals resulting in a RERA index of - events per hour. The Respiratory Disturbance Index is 15.3 events per hour.  The  snore index was - events per hour.  Mean oxygen saturation was 90.3%.  The lowest oxygen saturation during sleep was 82.0%.  Time spent <=88% oxygen saturation was 24.6 minutes (13.1%).  Limb Activity During the diagnostic portion of the study, there were 0 limb movements recorded.  Of this total, - were classified as PLMs.  Of the PLMs, - were associated with arousals.  The Limb  Movement index was 0 per hour while the PLM index was - per hour.  During the treatment portion of the study, there were 0 limb movements recorded  Cardiac Summary During the diagnostic portion of the study, the average pulse rate was 92.7 bpm.  The minimum pulse rate was 78.0 bpm while the maximum pulse rate was 111.0 bpm.  During the treatment portion of the study, the average pulse rate was 87.7 bpm.  The minimum pulse rate was 72.0 bpm while the maximum pulse rate was 115.0 bpm.   Diagnosis:  Very severe obstructive sleep apnea with AHI 84.8 /h (3%) and 79.5/h (4%) and O2 nadir at 74%. Moderate snoring Nocturnal hypoxemia   Recommendations: Therapeutic CPAP with EPR of 3 at 14 - 20 cm of water. Effort should be made to optimize nasal and oropharyngeal patency. Avoid alcohol, sedatives and other CNS depressants that may worsen sleep apnea and disrupt normal sleep architecture.Sleep hygiene should be reviewed. Weight management and exercise.   This study was personally reviewed and electronically signed by: Millicent Ally, MD Accredited Board Certified in Sleep Medicine Date/Time: Aug 23, 2023/6:44 pm       Split Night Report  Patient Name: Rhonda Colon Date: 08/15/2023  Date of Birth: 08-Jul-1975 Study Type: Split Night  Age: 59 year MRN #: [id number]  Sex: Female Interpreting Physician: Magnus Schuller [id number]  Height: 5\' 4"  Referring Physician: Eilleen Grates, MD  Weight: 369.0 lbs Recording Tech: Holly Neeriemer RPSGT RST  BMI: 63.8 Scoring Tech: Holly Neeriemer RPSGT RST  ESS: 9 Neck Size: 18.5  Mask Type ResMed Airfit F20, Full-Face Final Pressure: 15  Mask Size: Small Supplemental O2: N/A   Study Overview  DIAGNOSTIC TREATMENT  Lights Off: 10:31:44 PM Lights Off: 02:12:17 AM  Lights On: 02:12:17 AM Lights On: 05:22:02 AM  Time in Bed: 220.6 min. Time in Bed: 189.8 min.  Total Sleep Time: 126.0 min. Total Sleep Time: 137.0 min.  Sleep Efficiency:  57.1% Sleep Efficiency: 72.2%  Sleep Latency: 21.5 min. Sleep Latency: 53.0 min.  REM Latency from Sleep Onset: - min. REM Latency from Sleep Onset: 50.0 min.  Wake After Sleep Onset: 73.0 min. Wake After Sleep Onset: - min.   DIAGNOSTIC TREATMENT   Count Index  Count Index  Awakenings: 22 10.5 Awakenings: - 0.0  Arousals: 145 69.0 Arousals: 21 9.2  AHI (>=3% Desat and/or Ar.): 178 84.8 AHI (>=3% Desat and/or Ar.): 35 15.3  AHI (>=4% Desat): 167 79.5 AHI (>=4% Desat): 25 10.9   Limb Movements: - - Limb Movements: - -  Snore: 9 4.3 Snore: - -  Desaturations: 190 90.5 Desaturations: 40 17.5  Minimum SpO2 TST: 74.0% Minimum SpO2 TST: 82.0%    Sleep Architecture   DIAGNOSTIC TREATMENT ENTIRE NIGHT  Stages Time (mins) % Sleep Time Time (mins) % Sleep Time Time (mins) % Sleep Time  Wake 95.0  53.0  148.0   Stage N1 21.0 16.7% 4.0 2.9% 25.0 9.5%  Stage N2 88.5 70.2% 72.5 52.9% 161.0 61.2%  Stage N3 16.5 13.1% 1.5 1.1% 18.0 6.8%  REM 0.0 0.0%  59.0 43.1% 59.0 22.4%   Arousal Summary   DIAGNOSTIC TREATMENT   NREM REM TST Index NREM REM TST Index  Respiratory Ar. 128 - 128 61.0 8 1 9  3.9  PLM Ar. - - - - - - - -  Isolated Limb Movement Ar. - - - - - - - -  Snore Ar. 1 - 1 0.5 - - - -  Spontaneous Ar. 16 - 16 7.6 10 2 12  5.3  Total Ar. 145 - 145 69.0 18 3 21  9.2    Respiratory Summary  DIAGNOSTIC By Sleep Stage By Body Position Total   NREM REM Supine Non-Supine   Time (min) 126.0 0.0 - 126.0 126.0         Obstructive Apnea 1 - - 1 1  Mixed Apnea - - - - -  Central Apnea 2 - - 2 2  Total Apneas 3 - - 3 3  Total Apnea Index 1.4 - - 1.4 1.4         Hypopneas (>=3% Desat and/or Ar.) 175 - - 175 175  AHI (>=3% Desat and/or Ar.) 84.8 - - 84.8 84.8         Hypopneas (>=4% Desat) 164 - - 164 164  AHI (>=4% Desat) 79.5 - - 79.5 79.5          RERAs 1 - - 1 1  RERA Index 0.5 - - 0.5 0.5         RDI 85.2 - - 85.2 85.2    TREATMENT By Sleep Stage By Body Position Total   NREM  REM Supine Non-Supine   Time (min) 78.0 59.0 - 137.0 137.0         Obstructive Apnea - - - - -  Mixed Apnea - - - - -  Central Apnea - 1 - 1 1  Total Apneas - 1 - 1 1  Total Apnea Index - 1.0 - 0.4 0.4         Hypopneas (>=3% Desat and/or Ar.) 27 7 - 34 34  AHI (>=3% Desat and/or Ar.) 20.8 8.1 - 15.3 15.3         Hypopneas (>=4% Desat) 23 1 - 24 24  AHI (>=4% Desat) 17.7 2.0 - 10.9 10.9          RERAs - - - - -  RERA Index - - - - -         RDI 20.8 8.1 - 15.3 15.3    Respiratory Event Durations   DIAGNOSTIC TREATMENT  Apnea NREM REM NREM REM  Average (seconds) 11.5 - - 10.4  Maximum (seconds) 13.7 - - 10.4  Hypopnea      Average (seconds) 16.9 - 19.4 16.0  Maximum (seconds) 26.4 - 27.3 20.7    Limb Movement Summary   DIAGNOSTIC TREATMENT   Count Index Count Index  Isolated Limb Movements - - - -  Periodic Limb Movements (PLMs) - - - -  Total Limb Movements - - - -    Oxygen Saturation Summary   DIAGNOSTIC TREATMENT   Wake NREM REM TST Wake NREM REM TST  Average SpO2 89.9% 87.1% - 87.1% 91.6% 89.3% 90.6% 89.9%  Minimum SpO2 76.0% 74.0% - 74.0%  86.0% 82.0% 85.0% 82.0%   Maximum SpO2 98.0% 98.0% - 98.0%  96.0% 96.0% 93.0% 96.0%    DIAGNOSTIC Oxygen Saturation Distribution  Range (%) Time in range (min) Time in range (%)   90.0 - 100.0 50.9 24.0%  80.0 - 90.0  153.4 72.5%  70.0 - 80.0 7.2 3.4%  60.0 - 70.0 - -  50.0 - 60.0 - -  0.0 - 50.0 - -  Time Spent <=88% SpO2  Range (%) Time in range (min) Time in range (%)  0.0 - 88.0 110.0 52.0%      Count Index  Desaturations: 190 90.5   TREATMENT Oxygen Saturation Distribution  Range (%) Time in range (min) Time in range (%)   90.0 - 100.0 95.4 50.8%  80.0 - 90.0 92.4 49.2%  70.0 - 80.0 - -  60.0 - 70.0 - -  50.0 - 60.0 - -  0.0 - 50.0 - -  Time Spent <=88% SpO2  Range (%) Time in range (min) Time in range (%)  0.0 - 88.0 24.6 13.1%      Count Index  Desaturations: 40 17.5     Cardiac  Summary   DIAGNOSTIC TREATMENT   Wake NREM REM Total Wake NREM REM Total  Average Pulse Rate (BPM) 96.7 90.0 - 92.7 92.4 87.9 83.5 87.7  Minimum Pulse Rate (BPM) 79.0 78.0 - 78.0 82.0 77.0 72.0 72.0  Maximum Pulse Rate (BPM) 111.0 108.0 - 111.0 115.0 103.0 104.0 115.0   Pulse Rate Distribution   DIAGNOSTIC  Range (bpm) Time in range (min) Time in range (%)  0.0 - 40.0 - -  40.0 - 60.0 - -  60.0 - 80.0 2.2 1.1%  80.0 - 100.0 195.2 92.5%  100.0 - 120.0 13.7 6.5%  120.0 - 140.0 - -  140.0 - 200.0 - -   TREATMENT  Range (bpm) Time in range (min) Time in range (%)  0.0 - 40.0 - -  40.0 - 60.0 - -  60.0 - 80.0 15.7 8.3%  80.0 - 100.0 170.4 90.6%  100.0 - 120.0 1.9 1.0%  120.0 - 140.0 - -  140.0 - 200.0 - -   EtCO2 Summary - Diagnostic  Stage Min (mmHg) Average (mmHg) Max (mmHg)  Wake - - -  NREM(1+2+3) - - -  REM - - -   EtCO2 Distribution:  Range (mmHg) Time in range (min) Time in range (%)  20.0 - 40.0 - -  40.0 - 50.0 - -  50.0 - 100.0 - -  55.0 - 100.0 - -  Excluded data <20.0 & >65.0 221.0 100.0%   Titration Summary  PAP Device PAP Level O2 Level Time (min) Wake (min) NREM (min) REM (min) Sleep Eff% OA# CA# MA# Hyp# (>=3%) AHI (>=3%) Hyp# (>=4%) AHI (>=%4) RERA RDI OSat <=88% (min) Min [employer] Ar. Index  - Off - 221.0 95.0 126.0 0.0 57.0% 1 2 - 175 84.8 164  79.5 1  85.2  83.6 74.0 87.1 69.0  CPAP 5 - 8.5 8.5 0.0 0.0 0.0%               CPAP 6 - 6.0 6.0 0.0 0.0 0.0%               CPAP 7 - 43.0 38.5 4.5 0.0 10.5% - - - 5 66.7 5  66.7 -  66.7  2.3 82.0 88.7 53.3  CPAP 8 - 6.5 0.0 6.5 0.0 100.0% - - - 11 101.5 10  92.3 -  101.5  4.1 83.0 87.8 46.2  CPAP 9 - 6.0 0.0 6.0 0.0 100.0% - - - 6 60.0 6  60.0 -  60.0  4.6 84.0 87.2 50.0  CPAP 10 - 8.0 0.0 8.0 0.0 100.0% - - - 2 15.0 -  - -  15.0  7.8 85.0 87.0 7.5  CPAP 11 - 7.5 0.0 7.5 0.0 100.0% - - - 2 16.0 1  8.0 -  16.0  0.7 86.0 89.0 8.0  CPAP 12 - 26.0 0.0 17.5 8.5 100.0% - - - 2 4.6 -  - -  4.6  1.0 87.0  89.4 -  CPAP 13 - 36.0 0.0 4.0 32.0 100.0% - 1 - 4 8.3 1  3.3 -  8.3  0.5 85.0 90.5 6.7  CPAP 14 - 18.0 0.0 2.0 16.0 100.0% - - - 1 3.3 -  - -  3.3  0.0 89.0 91.1 -  CPAP 15 - 24.5 0.0 22.0 2.5 100.0% - - - 1 2.4 1  2.4 -  2.4  0.0 88.0 91.1 2.4    Hypnograms                           Technologist Comments  The 48 year old female patient [facility] for associated diagnosis of snoring and excessive daytime sleepiness. A split night study was ordered.  The patient had some anxiety and worry about the study and CPAP. Tech tried patient on a few masks. Patient liked the full face and best fit was the ResMed Airfit F20 FFM size small. She would like to try a medium but we did not have any available here. She was desensed on this mask before study start and she tolerated it well.  The patient reports sleeping on an adjustable bed at home and she elevates the head of the bed and bolsters herself with pillows on her left side. She states she can't breathe if not elevated. Patient was accomodated in the sleep lab with a wedge +2 pillows.    The patient met split criteria with severe AHI and oxygen desaturations. CPAP was applied shortly after 0200. Patient had some claustrophobia at first and took a few minutes. Tech adjusted the sound machine in the room and helped patient as needed. Patient requested more air at start of CPAP and it was increased to 7 cm/H2O, EPR: 3. Patient achieved REM during CPAP portion of the study. Events and snore appeared to be eliminated on final pressure of 15 cm/H2O, EPR:3.  (Patient stayed on the wedge+2 pillows, but was more supine than lateral for the last 2 hours of study. Since she was elevated and in between positions tech left position displayed as Left lateral).  Supplemental O2: N/A  Loud Snoring noted with events and arousals before Split/CPAP.  Restroom Visits: 1 PLMs/PLMAs: none noted ECG: Sinus Rhythm with  rare Arrhythmias noted: see epochs 592, 593, 606 No obvious parasomnias were observed REM was delayed and not achieved until CPAP portion of the night.  Split-Night Study Mask: ResMed Airfit F20 Full-Face, Size: Small.  Final Pressure: CPAP 15 cm/H2O, EPR: 3

## 2023-08-23 NOTE — Procedures (Signed)
 [facility] [address] [place], [place] [address] Tel: [phone]   Fax: [phone]  Split Night Interpretation  Patient Name:  Rhonda Colon, Rhonda Colon Date:  08/15/2023 Referring Physician:  Eilleen Grates, MD  Indications for Polysomnography The patient is a 48 year old Female who is 5\' 4"  and weighs 369.0 lbs.  Her BMI equals 63.8.  A diagnostic polysomnogram was performed to evaluate for OSA.  After 126.0 minutes of sleep time the patient exhibited sufficient respiratory events qualifying her for a CPAP trial which was then initiated.    Medication  Xanax   Tylenol   Xanax    Polysomnogram Data A full night polysomnogram was performed recording the standard physiologic parameters including EEG, EOG, EMG, EKG, nasal and oral airflow.  Respiratory parameters of chest and abdominal movements are recorded with [place] motion transducers.  Oxygen saturation was recorded by pulse oximetry.    Sleep Architecture The total recording time of the diagnostic portion of the study was 220.6 minutes.  The total sleep time was 126.0 minutes.  During the diagnostic portion of the study, the patient spent 16.7% of total sleep time in Stage N1, 70.2% in Stage N2, 13.1% in Stages N3, and 0.0% in REM.   Sleep latency was 21.5 minutes.  REM latency was 0 minutes.  Sleep Efficiency was 57.1%.  Wake after Sleep Onset time was 73.0 minutes.   At 02:12:35 AM the patient was placed on PAP treatment and was titrated at pressures ranging from 5* cm/H20 with supplemental oxygen at 0 up to 15* cm/H20 without supplemental oxygen.  The total recording time of the treatment portion of the study was 189.8 minutes.  The total sleep time was 137.0 minutes.  During the treatment portion of the study, the patient spent 2.9% of total sleep time in Stage N1, 52.9% in Stage N2, 1.1% in Stages N3, and 43.1% in REM.   Sleep latency was 53.0 minutes.  REM latency was 50.0 minutes.   Sleep Efficiency was 72.2%.  Wake after Sleep Onset time was - minutes.  Respiratory Events During the diagnostic portion of the study, the polysomnogram revealed a presence of 1 obstructive, 2 central, and - mixed apneas resulting in an Apnea index of 1.4 events per hour.  There were 175 hypopneas (>=3% desaturation and/or arousal) resulting in an Apnea\Hypopnea Index (AHI >=3% desaturation and/or arousal) of 84.8 events per hour.  There were 164 hypopneas (>=4% desaturation) resulting in an Apnea\Hypopnea Index (AHI >=4% desaturation) of 79.5 events per hour.  There were 1 Respiratory Effort Related Arousals resulting in a RERA index of 0.5 events per hour. The Respiratory Disturbance Index is 85.2 events per hour.  The snore index was 4.3 events per hour.  Mean oxygen saturation was 88.2%.  The lowest oxygen saturation during sleep was 74.0%.  Time spent <=88% oxygen saturation was 110.0 minutes (52.0%).    During the treatment portion of the study, the polysomnogram revealed a presence of - obstructive, 1 central, and - mixed apneas resulting in an Apnea index of 0.4 events per hour.  There were 34 hypopneas (>=3% desaturation and/or arousal) resulting in an Apnea\Hypopnea Index (AHI >=3% desaturation and/or arousal) of 15.3 events per hour.  There were 24 hypopneas (>=4% desaturation) resulting in an Apnea\Hypopnea Index (AHI >=4% desaturation) of 10.9 events per hour.  There were - Respiratory Effort Related Arousals resulting in a RERA index of - events per hour. The Respiratory Disturbance Index is 15.3 events per hour.  The snore index was - events per hour.  Mean oxygen saturation was 90.3%.  The lowest oxygen saturation during sleep was 82.0%.  Time spent <=88% oxygen saturation was 24.6 minutes (13.1%).  Limb Activity During the diagnostic portion of the study, there were 0 limb movements recorded.  Of this total, - were classified as PLMs.  Of the PLMs, - were associated with arousals.  The Limb  Movement index was 0 per hour while the PLM index was - per hour.  During the treatment portion of the study, there were 0 limb movements recorded  Cardiac Summary During the diagnostic portion of the study, the average pulse rate was 92.7 bpm.  The minimum pulse rate was 78.0 bpm while the maximum pulse rate was 111.0 bpm.  During the treatment portion of the study, the average pulse rate was 87.7 bpm.  The minimum pulse rate was 72.0 bpm while the maximum pulse rate was 115.0 bpm.   Diagnosis:  Very severe obstructive sleep apnea with AHI 84.8 /h (3%) and 79.5/h (4%) and O2 nadir at 74%. Moderate snoring Nocturnal hypoxemia   Recommendations: Therapeutic CPAP with EPR of 3 at 14 - 20 cm of water. Effort should be made to optimize nasal and oropharyngeal patency. Avoid alcohol, sedatives and other CNS depressants that may worsen sleep apnea and disrupt normal sleep architecture.Sleep hygiene should be reviewed. Weight management and exercise.   This study was personally reviewed and electronically signed by:  Millicent Ally, MD, [facility], ABSM Accredited Board Certified in Sleep Medicine Date/Time: Aug 23, 2023/6:44 pm       Split Night Report  Patient Name: Rhonda Colon, Rhonda Colon Date: 08/15/2023  Date of Birth: 11/29/1975 Study Type: Split Night  Age: 37 year MRN #: [id number]  Sex: Female Interpreting Physician: Magnus Schuller [id number]  Height: 5\' 4"  Referring Physician: Eilleen Grates, MD  Weight: 369.0 lbs Recording Tech: Holly Neeriemer RPSGT RST  BMI: 63.8 Scoring Tech: Holly Neeriemer RPSGT RST  ESS: 9 Neck Size: 18.5  Mask Type ResMed Airfit F20, Full-Face Final Pressure: 15  Mask Size: Small Supplemental O2: N/A   Study Overview  DIAGNOSTIC TREATMENT  Lights Off: 10:31:44 PM Lights Off: 02:12:17 AM  Lights On: 02:12:17 AM Lights On: 05:22:02 AM  Time in Bed: 220.6 min. Time in Bed: 189.8 min.  Total Sleep Time: 126.0 min. Total Sleep Time: 137.0 min.  Sleep  Efficiency: 57.1% Sleep Efficiency: 72.2%  Sleep Latency: 21.5 min. Sleep Latency: 53.0 min.  REM Latency from Sleep Onset: - min. REM Latency from Sleep Onset: 50.0 min.  Wake After Sleep Onset: 73.0 min. Wake After Sleep Onset: - min.   DIAGNOSTIC TREATMENT   Count Index  Count Index  Awakenings: 22 10.5 Awakenings: - 0.0  Arousals: 145 69.0 Arousals: 21 9.2  AHI (>=3% Desat and/or Ar.): 178 84.8 AHI (>=3% Desat and/or Ar.): 35 15.3  AHI (>=4% Desat): 167 79.5 AHI (>=4% Desat): 25 10.9   Limb Movements: - - Limb Movements: - -  Snore: 9 4.3 Snore: - -  Desaturations: 190 90.5 Desaturations: 40 17.5  Minimum SpO2 TST: 74.0% Minimum SpO2 TST: 82.0%    Sleep Architecture   DIAGNOSTIC TREATMENT ENTIRE NIGHT  Stages Time (mins) % Sleep Time Time (mins) % Sleep Time Time (mins) % Sleep Time  Wake 95.0  53.0  148.0   Stage N1 21.0 16.7% 4.0 2.9% 25.0 9.5%  Stage N2 88.5 70.2% 72.5 52.9% 161.0 61.2%  Stage N3 16.5 13.1% 1.5 1.1% 18.0 6.8%  REM 0.0 0.0% 59.0 43.1% 59.0 22.4%   Arousal Summary   DIAGNOSTIC TREATMENT   NREM REM TST Index NREM REM TST Index  Respiratory Ar. 128 - 128 61.0 8 1 9  3.9  PLM Ar. - - - - - - - -  Isolated Limb Movement Ar. - - - - - - - -  Snore Ar. 1 - 1 0.5 - - - -  Spontaneous Ar. 16 - 16 7.6 10 2 12  5.3  Total Ar. 145 - 145 69.0 18 3 21  9.2    Respiratory Summary  DIAGNOSTIC By Sleep Stage By Body Position Total   NREM REM Supine Non-Supine   Time (min) 126.0 0.0 - 126.0 126.0         Obstructive Apnea 1 - - 1 1  Mixed Apnea - - - - -  Central Apnea 2 - - 2 2  Total Apneas 3 - - 3 3  Total Apnea Index 1.4 - - 1.4 1.4         Hypopneas (>=3% Desat and/or Ar.) 175 - - 175 175  AHI (>=3% Desat and/or Ar.) 84.8 - - 84.8 84.8         Hypopneas (>=4% Desat) 164 - - 164 164  AHI (>=4% Desat) 79.5 - - 79.5 79.5          RERAs 1 - - 1 1  RERA Index 0.5 - - 0.5 0.5         RDI 85.2 - - 85.2 85.2    TREATMENT By Sleep Stage By Body Position  Total   NREM REM Supine Non-Supine   Time (min) 78.0 59.0 - 137.0 137.0         Obstructive Apnea - - - - -  Mixed Apnea - - - - -  Central Apnea - 1 - 1 1  Total Apneas - 1 - 1 1  Total Apnea Index - 1.0 - 0.4 0.4         Hypopneas (>=3% Desat and/or Ar.) 27 7 - 34 34  AHI (>=3% Desat and/or Ar.) 20.8 8.1 - 15.3 15.3         Hypopneas (>=4% Desat) 23 1 - 24 24  AHI (>=4% Desat) 17.7 2.0 - 10.9 10.9          RERAs - - - - -  RERA Index - - - - -         RDI 20.8 8.1 - 15.3 15.3    Respiratory Event Durations   DIAGNOSTIC TREATMENT  Apnea NREM REM NREM REM  Average (seconds) 11.5 - - 10.4  Maximum (seconds) 13.7 - - 10.4  Hypopnea      Average (seconds) 16.9 - 19.4 16.0  Maximum (seconds) 26.4 - 27.3 20.7    Limb Movement Summary   DIAGNOSTIC TREATMENT   Count Index Count Index  Isolated Limb Movements - - - -  Periodic Limb Movements (PLMs) - - - -  Total Limb Movements - - - -    Oxygen Saturation Summary   DIAGNOSTIC TREATMENT   Wake NREM REM TST Wake NREM REM TST  Average SpO2 89.9% 87.1% - 87.1% 91.6% 89.3% 90.6% 89.9%  Minimum SpO2 76.0% 74.0% - 74.0%  86.0% 82.0% 85.0% 82.0%   Maximum SpO2 98.0% 98.0% - 98.0%  96.0% 96.0% 93.0% 96.0%    DIAGNOSTIC Oxygen Saturation Distribution  Range (%) Time in range (min) Time in range (%)   90.0 - 100.0 50.9 24.0%  80.0 - 90.0 153.4 72.5%  70.0 - 80.0 7.2 3.4%  60.0 - 70.0 - -  50.0 - 60.0 - -  0.0 - 50.0 - -  Time Spent <=88% SpO2  Range (%) Time in range (min) Time in range (%)  0.0 - 88.0 110.0 52.0%      Count Index  Desaturations: 190 90.5   TREATMENT Oxygen Saturation Distribution  Range (%) Time in range (min) Time in range (%)   90.0 - 100.0 95.4 50.8%  80.0 - 90.0 92.4 49.2%  70.0 - 80.0 - -  60.0 - 70.0 - -  50.0 - 60.0 - -  0.0 - 50.0 - -  Time Spent <=88% SpO2  Range (%) Time in range (min) Time in range (%)  0.0 - 88.0 24.6 13.1%      Count Index  Desaturations: 40 17.5      Cardiac Summary   DIAGNOSTIC TREATMENT   Wake NREM REM Total Wake NREM REM Total  Average Pulse Rate (BPM) 96.7 90.0 - 92.7 92.4 87.9 83.5 87.7  Minimum Pulse Rate (BPM) 79.0 78.0 - 78.0 82.0 77.0 72.0 72.0  Maximum Pulse Rate (BPM) 111.0 108.0 - 111.0 115.0 103.0 104.0 115.0   Pulse Rate Distribution   DIAGNOSTIC  Range (bpm) Time in range (min) Time in range (%)  0.0 - 40.0 - -  40.0 - 60.0 - -  60.0 - 80.0 2.2 1.1%  80.0 - 100.0 195.2 92.5%  100.0 - 120.0 13.7 6.5%  120.0 - 140.0 - -  140.0 - 200.0 - -   TREATMENT  Range (bpm) Time in range (min) Time in range (%)  0.0 - 40.0 - -  40.0 - 60.0 - -  60.0 - 80.0 15.7 8.3%  80.0 - 100.0 170.4 90.6%  100.0 - 120.0 1.9 1.0%  120.0 - 140.0 - -  140.0 - 200.0 - -   EtCO2 Summary - Diagnostic  Stage Min (mmHg) Average (mmHg) Max (mmHg)  Wake - - -  NREM(1+2+3) - - -  REM - - -   EtCO2 Distribution:  Range (mmHg) Time in range (min) Time in range (%)  20.0 - 40.0 - -  40.0 - 50.0 - -  50.0 - 100.0 - -  55.0 - 100.0 - -  Excluded data <20.0 & >65.0 221.0 100.0%   Titration Summary  PAP Device PAP Level O2 Level Time (min) Wake (min) NREM (min) REM (min) Sleep Eff% OA# CA# MA# Hyp# (>=3%) AHI (>=3%) Hyp# (>=4%) AHI (>=%4) RERA RDI OSat <=88% (min) Min [employer] Ar. Index  - Off - 221.0 95.0 126.0 0.0 57.0% 1 2 - 175 84.8 164  79.5 1  85.2  83.6 74.0 87.1 69.0  CPAP 5 - 8.5 8.5 0.0 0.0 0.0%               CPAP 6 - 6.0 6.0 0.0 0.0 0.0%               CPAP 7 - 43.0 38.5 4.5 0.0 10.5% - - - 5 66.7 5  66.7 -  66.7  2.3 82.0 88.7 53.3  CPAP 8 - 6.5 0.0 6.5 0.0 100.0% - - - 11 101.5 10  92.3 -  101.5  4.1 83.0 87.8 46.2  CPAP 9 - 6.0 0.0 6.0 0.0 100.0% - - - 6 60.0 6  60.0 -  60.0  4.6 84.0 87.2 50.0  CPAP 10 - 8.0 0.0 8.0 0.0 100.0% - - -  2 15.0 -  - -  15.0  7.8 85.0 87.0 7.5  CPAP 11 - 7.5 0.0 7.5 0.0 100.0% - - - 2 16.0 1  8.0 -  16.0  0.7 86.0 89.0 8.0  CPAP 12 - 26.0 0.0 17.5 8.5 100.0% - - - 2 4.6 -  - -   4.6  1.0 87.0 89.4 -  CPAP 13 - 36.0 0.0 4.0 32.0 100.0% - 1 - 4 8.3 1  3.3 -  8.3  0.5 85.0 90.5 6.7  CPAP 14 - 18.0 0.0 2.0 16.0 100.0% - - - 1 3.3 -  - -  3.3  0.0 89.0 91.1 -  CPAP 15 - 24.5 0.0 22.0 2.5 100.0% - - - 1 2.4 1  2.4 -  2.4  0.0 88.0 91.1 2.4    Hypnograms                           Technologist Comments  The 48 year old female patient was seen in the [facility] for associated diagnosis of snoring and excessive daytime sleepiness. A split night study was ordered.  The patient had some anxiety and worry about the study and CPAP. Tech tried patient on a few masks. Patient liked the full face and best fit was the ResMed Airfit F20 FFM size small. She would like to try a medium but we did not have any available here. She was desensed on this mask before study start and she tolerated it well.  The patient reports sleeping on an adjustable bed at home and she elevates the head of the bed and bolsters herself with pillows on her left side. She states she can't breathe if not elevated. Patient was accomodated in the sleep lab with a wedge +2 pillows.    The patient met split criteria with severe AHI and oxygen desaturations. CPAP was applied shortly after 0200. Patient had some claustrophobia at first and took a few minutes. Tech adjusted the sound machine in the room and helped patient as needed. Patient requested more air at start of CPAP and it was increased to 7 cm/H2O, EPR: 3. Patient achieved REM during CPAP portion of the study. Events and snore appeared to be eliminated on final pressure of 15 cm/H2O, EPR:3.  (Patient stayed on the wedge+2 pillows, but was more supine than lateral for the last 2 hours of study. Since she was elevated and in between positions tech left position displayed as Left lateral).  Supplemental O2: N/A  Loud Snoring noted with events and arousals before Split/CPAP.  Restroom Visits: 1 PLMs/PLMAs: none noted ECG:  Sinus Rhythm with rare Arrhythmias noted: see epochs 592, 593, 606 No obvious parasomnias were observed REM was delayed and not achieved until CPAP portion of the night.  Split-Night Study Mask: ResMed Airfit F20 Full-Face, Size: Small.  Final Pressure: CPAP 15 cm/H2O, EPR: 3   ELECTRONICALLY SIGNED ON:  08/23/2023, 6:47 PM [facility] PH: [phone]   FX: [phone] ACCREDITED BY THE AMERICAN ACADEMY OF SLEEP MEDICINE

## 2023-08-24 ENCOUNTER — Encounter: Payer: Self-pay | Admitting: Internal Medicine

## 2023-08-25 ENCOUNTER — Encounter: Payer: Self-pay | Admitting: Cardiology

## 2023-08-25 ENCOUNTER — Telehealth (INDEPENDENT_AMBULATORY_CARE_PROVIDER_SITE_OTHER): Admitting: Internal Medicine

## 2023-08-25 ENCOUNTER — Encounter: Payer: Self-pay | Admitting: Internal Medicine

## 2023-08-25 DIAGNOSIS — G4733 Obstructive sleep apnea (adult) (pediatric): Secondary | ICD-10-CM

## 2023-08-25 DIAGNOSIS — F419 Anxiety disorder, unspecified: Secondary | ICD-10-CM

## 2023-08-25 DIAGNOSIS — L304 Erythema intertrigo: Secondary | ICD-10-CM | POA: Diagnosis not present

## 2023-08-25 DIAGNOSIS — F32A Depression, unspecified: Secondary | ICD-10-CM | POA: Diagnosis not present

## 2023-08-25 DIAGNOSIS — B3731 Acute candidiasis of vulva and vagina: Secondary | ICD-10-CM | POA: Diagnosis not present

## 2023-08-25 MED ORDER — FLUCONAZOLE 150 MG PO TABS: 150.0000 mg | ORAL_TABLET | Freq: Once | ORAL | 2 refills | Status: AC

## 2023-08-25 MED ORDER — CLOTRIMAZOLE-BETAMETHASONE 1-0.05 % EX CREA: 1.0000 | TOPICAL_CREAM | Freq: Two times a day (BID) | CUTANEOUS | 1 refills | Status: AC

## 2023-08-25 NOTE — Progress Notes (Addendum)
   Subjective:    Patient ID: Rhonda Colon, female    DOB: Aug 22, 1975, 48 y.o.   MRN: [id number]  HPI 48 year old Female  seen today via interactive audio and video telecommunications.  Patient is at her office and I am at my office.  She is agreeable to visit in this format today.  Patient says that she is making considerable progress in therapy with [name].Continues with Xanax  and Wellbutrin . Feels more energetic. Has had sleep study confirming severe sleep apnea.Is getting a CPAP device.  Recently has developed intertrigo in left knee area with some skin breakdown.  Will prescribe Lotrisone  cream twice daily for that area.  Also has developed Candida vaginitis and is having vaginal itching.  Sending in Diflucan  150 mg tablet with 2 refills today.  All in all she says she is doing better, is more motivated and is quite pleased with [name].  She has physical exam scheduled here for June 24 and will have fasting labs in advance of that appointment.  History of coronary disease and has upcoming appointment with [name]  Type 2 diabetes mellitus with upcoming hemoglobin A1c with fasting labs for CPE.  Hemoglobin A1c in October was 6.3% and patient has not wanted to be on glucose lowering medication.  Will revisit this at physical exam in June.    Review of Systems currently has no chest pain and no respiratory distress.  Seen at rest in her office area.     Objective:   Physical Exam  She is seen virtually in no acute distress and looks well.       Assessment & Plan:   Intertrigo left knee  Morbid obesity  Anxiety and depression-improving  Candida vaginitis  Sleep apnea-patient is in the process of getting a CPAP device having been diagnosed with severe obstructive sleep apnea  Plan: Patient has been prescribed Diflucan  150 mg tablet with 2 refills to take 1 tablet for 1 dose.  Also Lotrisone  cream prescribed for intertrigo of her left knee twice daily  until healed.  Has upcoming appointment for health maintenance exam here in June.  Continue counseling with [name].  Time spent with video visit including chart review interviewing patient medical decision making and E scribing medications is 20 minutes  I, [name], MD, have reviewed all documentation for this visit. The documentation on 08/25/23 for the exam, diagnosis, procedures, and orders are all accurate and complete.

## 2023-08-25 NOTE — Patient Instructions (Signed)
 I have prescribed Lotrisone  cream to use in left knee area twice daily until irritation has healed.  Also sent in Diflucan  150 mg tablet with 2 refills today to take for Candida vaginitis.  Look forward to seeing you in June for health maintenance exam.  I am glad you are making progress with [name].

## 2023-08-26 ENCOUNTER — Telehealth: Payer: Self-pay

## 2023-08-26 ENCOUNTER — Ambulatory Visit (INDEPENDENT_AMBULATORY_CARE_PROVIDER_SITE_OTHER): Admitting: Psychology

## 2023-08-26 DIAGNOSIS — R0683 Snoring: Secondary | ICD-10-CM

## 2023-08-26 DIAGNOSIS — F411 Generalized anxiety disorder: Secondary | ICD-10-CM | POA: Diagnosis not present

## 2023-08-26 DIAGNOSIS — I2510 Atherosclerotic heart disease of native coronary artery without angina pectoris: Secondary | ICD-10-CM

## 2023-08-26 DIAGNOSIS — E785 Hyperlipidemia, unspecified: Secondary | ICD-10-CM

## 2023-08-26 DIAGNOSIS — R0602 Shortness of breath: Secondary | ICD-10-CM

## 2023-08-26 DIAGNOSIS — G4733 Obstructive sleep apnea (adult) (pediatric): Secondary | ICD-10-CM

## 2023-08-26 DIAGNOSIS — G4719 Other hypersomnia: Secondary | ICD-10-CM

## 2023-08-26 DIAGNOSIS — F331 Major depressive disorder, recurrent, moderate: Secondary | ICD-10-CM

## 2023-08-26 NOTE — Telephone Encounter (Signed)
-----   Message from Magnus Schuller sent at 08/23/2023  7:01 PM EDT ----- Rhonda Colon, please notify patient of the split night sleep study ( see encounters for study); set up with CPAP Auto with EPR of 3 at 14 - 20 cm of water.
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
## 2023-08-27 IMAGING — CT CT IMAGE GUIDED DRAINAGE BY PERCUTANEOUS CATHETER
3 of 6 series · 12 of 32 positions shown, 17 images · non-contrast
Comparison: none

INDICATION: 45-year-old with history of Morgagni hernia repair and recurrent
right chest fluid collection in the old hernia space. Percutaneous
drain was recently removed from this area. Patient presents for new
drain placement.

[Series 3: i-spiral 5.0 b40f · axial · 0.68mm/px · z∈[+1254,+1313]mm · 4 of 29 slices shown (1 of 3)]
[im 6/29  soft-tissue]
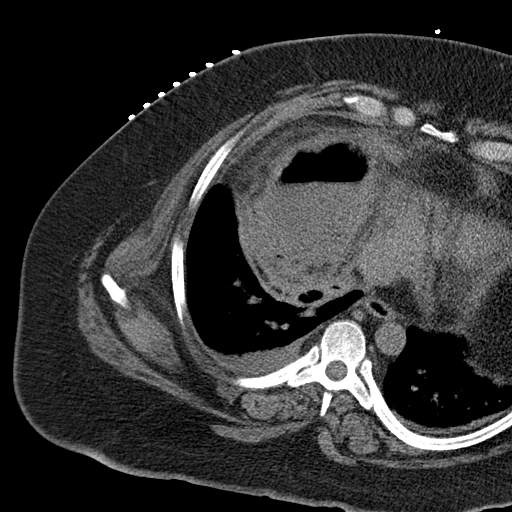
[im 12/29  soft-tissue]
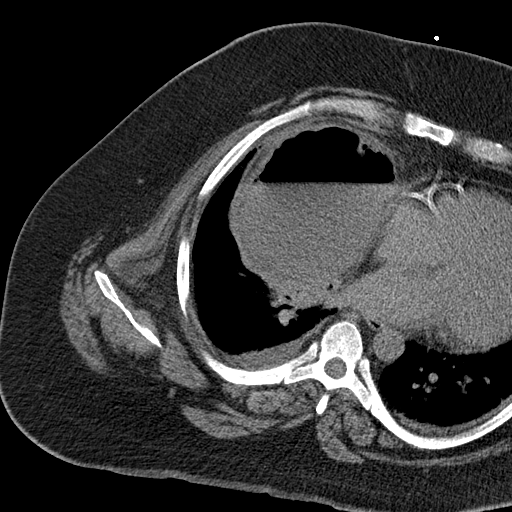
[im 17/29  soft-tissue]
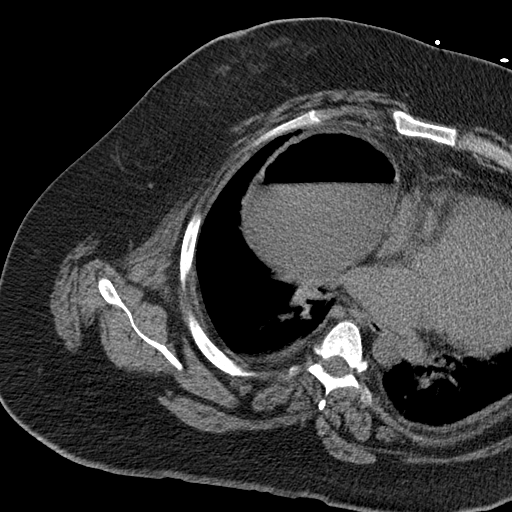
[im 23/29  soft-tissue]
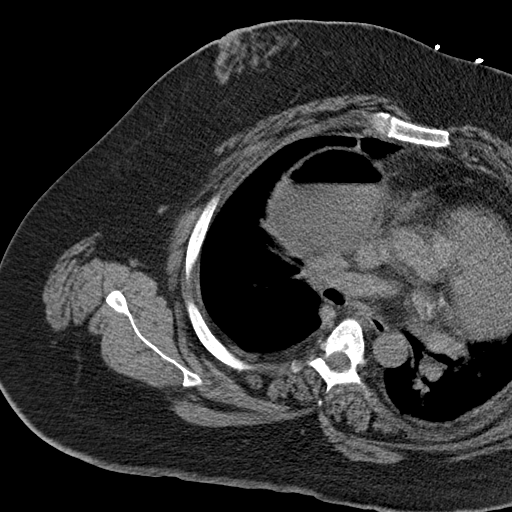

[Series 5: i-spiral 5.0 b40f · axial · 0.68mm/px · z∈[+1234,+1297]mm · 4 of 32 slices shown, 9 images (2 of 3)]
[im 7/32  soft-tissue]
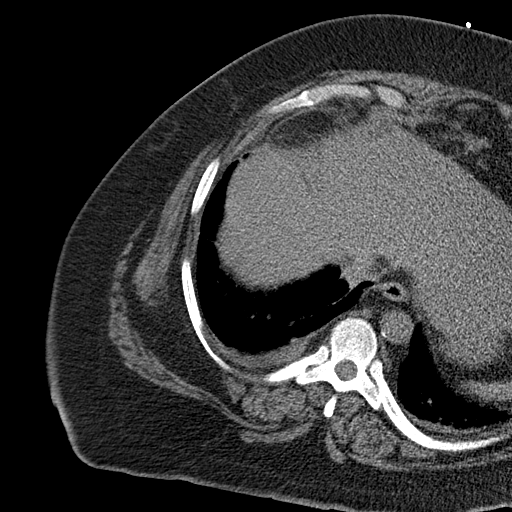
[im 7/32  lung]
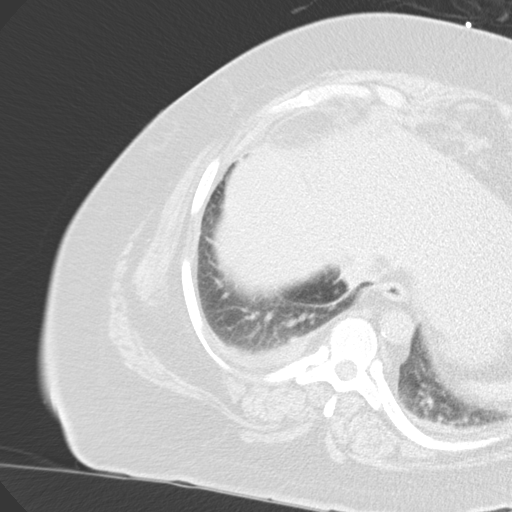
[im 7/32  bone]
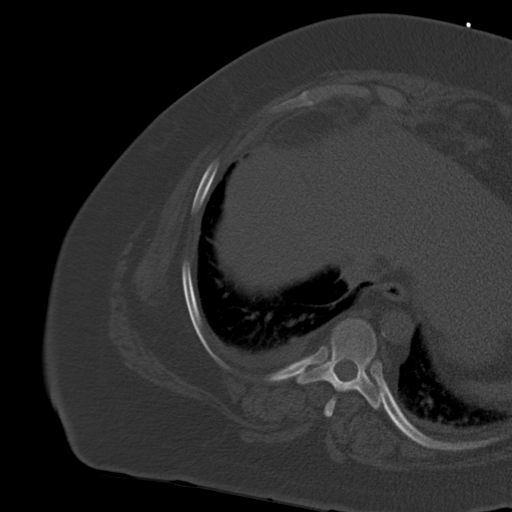
[im 13/32  soft-tissue]
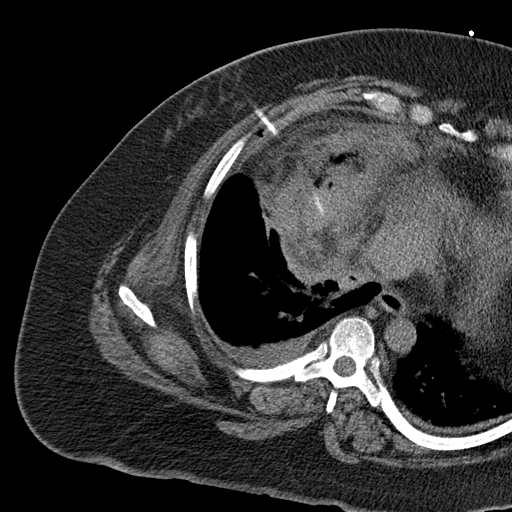
[im 13/32  lung]
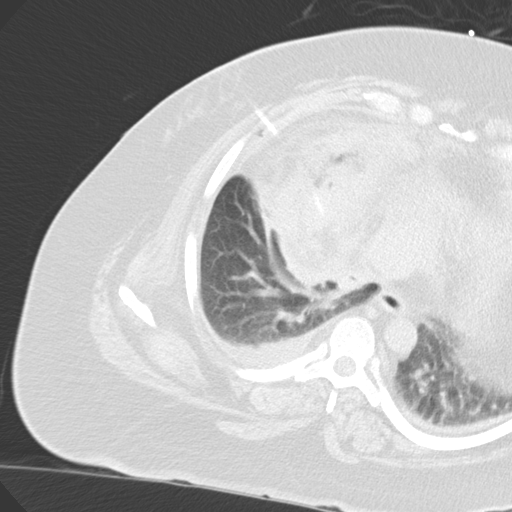
[im 19/32  soft-tissue]
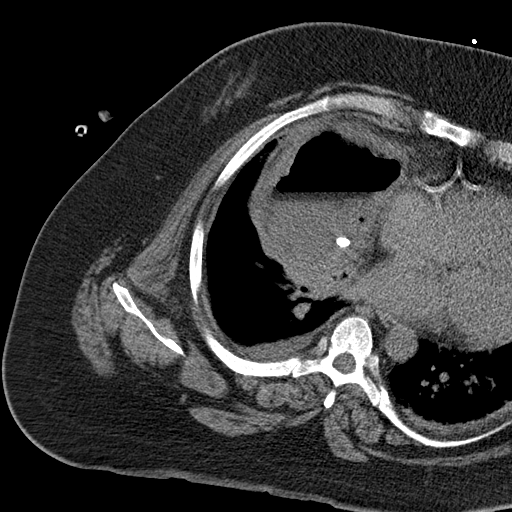
[im 19/32  lung]
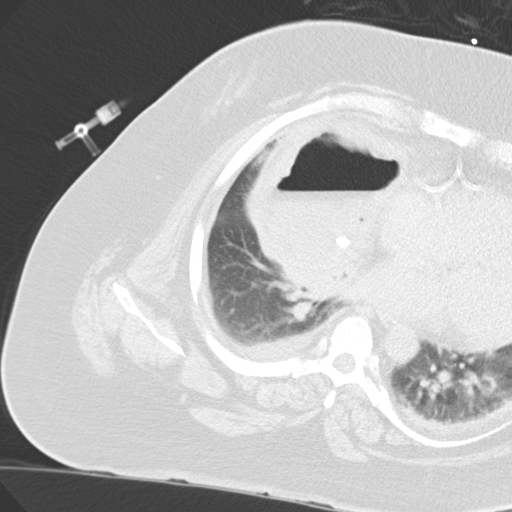
[im 25/32  soft-tissue]
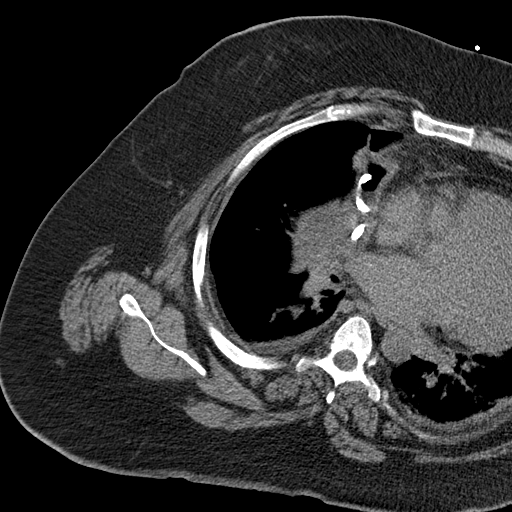
[im 25/32  lung]
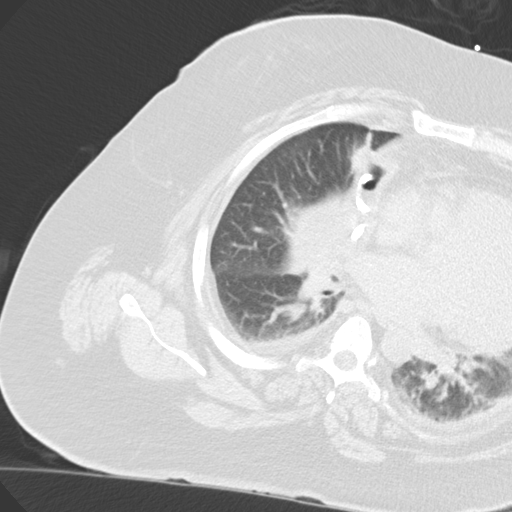

[Series 6: i-spiral 5.0 b40f · axial · 0.68mm/px · z∈[+1238,+1301]mm · 4 of 30 slices shown (3 of 3)]
[im 6/30  soft-tissue]
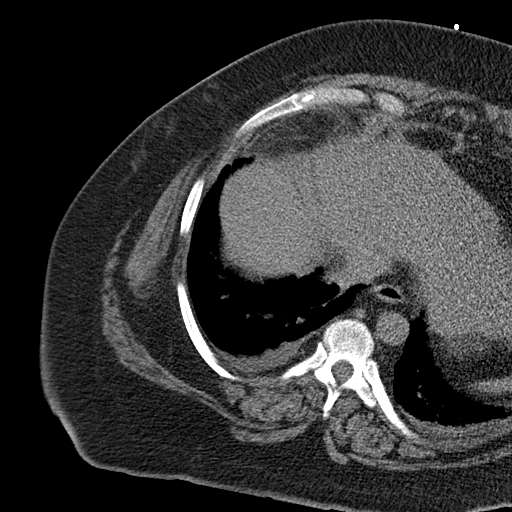
[im 12/30  soft-tissue]
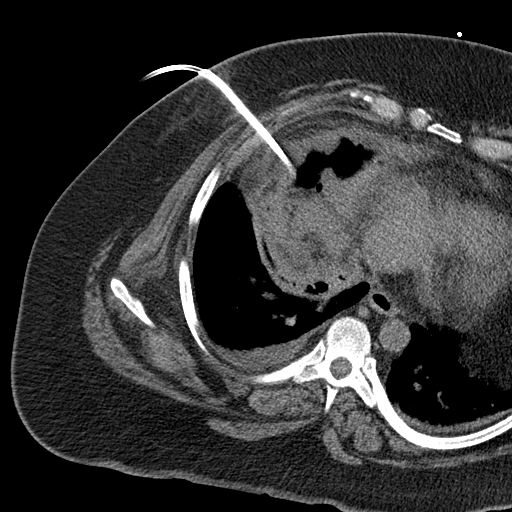
[im 18/30  soft-tissue]
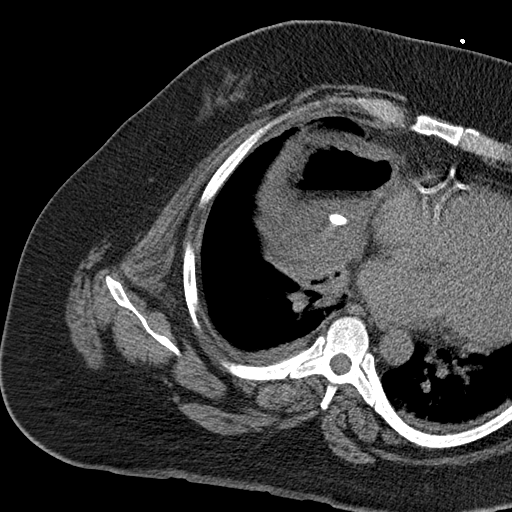
[im 24/30  soft-tissue]
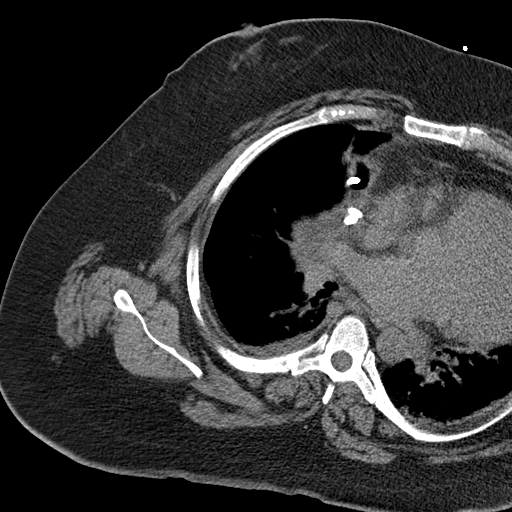

[12 of 32 positions shown; findings below may reference images not displayed]

EXAM:
CT-GUIDED PLACEMENT OF A RIGHT CHEST DRAIN

MEDICATIONS:
Moderate sedation

ANESTHESIA/SEDATION:
Moderate (conscious) sedation was employed during this procedure. A
total of Versed 4.0mg and fentanyl 100 mcg was administered
intravenously at the order of the provider performing the procedure.

Total intra-service moderate sedation time: 56 minutes.

Patient's level of consciousness and vital signs were monitored
continuously by radiology nurse throughout the procedure under the
supervision of the provider performing the procedure.

COMPLICATIONS:
None immediate.

PROCEDURE:
Informed written consent was obtained from the patient after a
thorough discussion of the procedural risks, benefits and
alternatives. All questions were addressed. A timeout was performed
prior to the initiation of the procedure.

Patient was placed supine on the CT scanner. CT images through the
lower chest were obtained. The large air-fluid collection in the
right lower chest and old hernia space was identified. The right
anterior chest was prepped and draped in sterile fashion. Maximal
barrier sterile technique was utilized including caps, mask, sterile
gowns, sterile gloves, sterile drape, hand hygiene and skin
antiseptic.

Skin was anesthetized with 1% lidocaine. A small incision was made.
Using CT guidance, an 18 gauge trocar needle was directed into the
air-fluid collection and foul-smelling brown fluid was aspirated.
Superstiff Amplatz wire was advanced into the collection and the
tract was dilated to accommodate a 12 French multipurpose drain.
Approximally 300 mL of brown fluid was removed. Follow up CT images
were obtained. The drain was slightly pulled back in order to
aspirate more fluid and air. Drain was initially connected to a
PleurEvac device but there was minimal output with suction. Drain
was flushed with saline and attached to a suction bulb. Catheter was
sutured to skin. Dressing was placed.
FINDINGS: Large air-fluid collection in the medial right lower chest at the
old Morgagni hernia site. Fluid collection does not appear to be
communicating with the right pleural space. 300 mL of foul-smelling
brown fluid was removed. At the end of the procedure, the air-fluid
collection was decompressed but there was a small amount of residual
gas within this collection.
IMPRESSION: CT-guided placement of a drainage catheter within the right lower
chest fluid collection located in the old hernia space. 300 mL of
foul-smelling fluid was removed. Collection was decompressed at the
end of the procedure. Fluid was sent for culture.

## 2023-08-27 NOTE — Telephone Encounter (Signed)
 Patient stated she was returning Rhonda Colon's call regarding getting CPAP equipment from [employer].

## 2023-08-28 IMAGING — DX DG CHEST 1V PORT
1 series · 1 of 1 positions shown · non-contrast
Comparison: 05/17/2021

CLINICAL DATA: Intrathoracic abscess.

EXAM:
PORTABLE CHEST 1 VIEW

[chest ap]
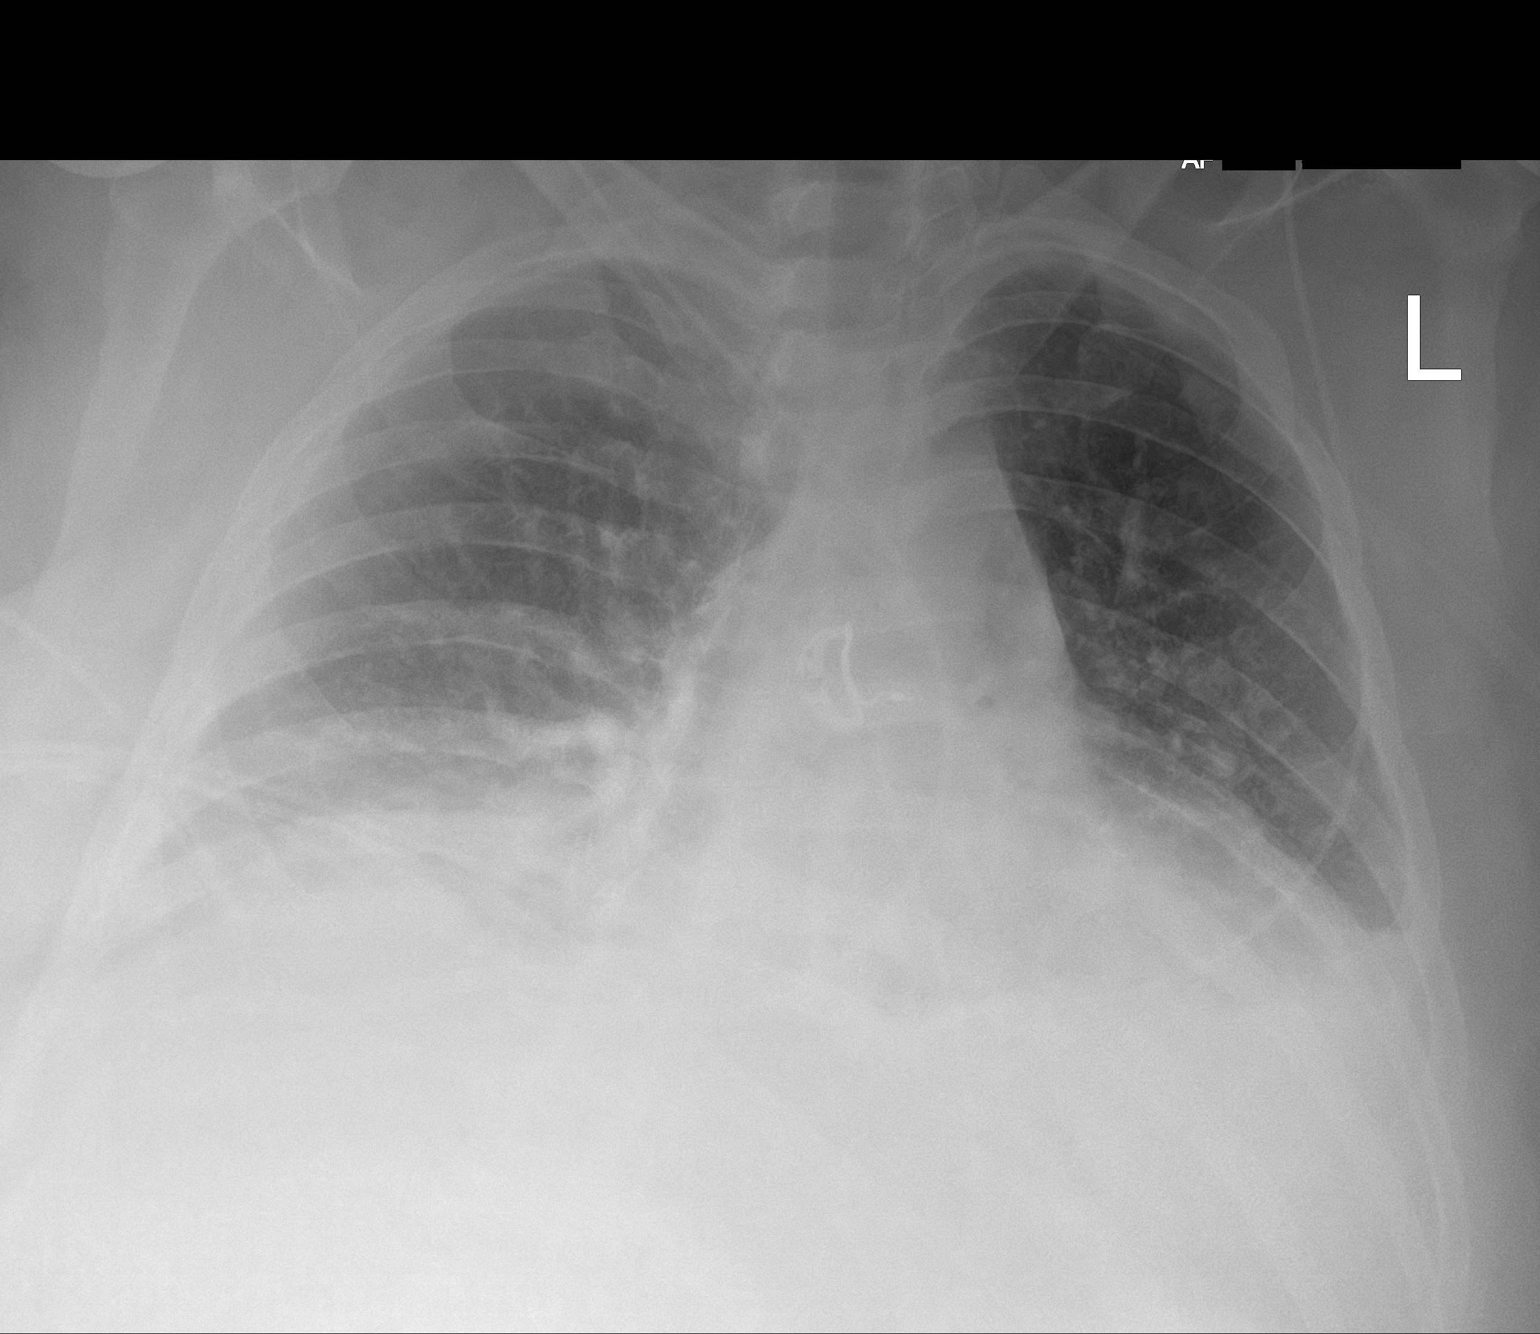

[1 of 1 positions shown; findings below may reference images not displayed]

FINDINGS: 0125 hours. Low lung volumes. Cavitary lesion seen medial right
hemithorax on the previous study has decreased substantially in the
interval. Cardiopericardial silhouette is at upper limits of normal
for size. Bibasilar atelectasis/infiltrate with small bilateral
pleural effusions again noted.

Although a discrete pleural line is not visible, lung markings at
the periphery of the right hemithorax are asymmetrically decreased.
IMPRESSION: 1. Interval decrease in size of cavitary lesion medial right
hemithorax.
2. Persistent bibasilar atelectasis/infiltrate with small bilateral
pleural effusions.
3. Decreased lung markings in the periphery of the right hemithorax.
Although a discrete pleural line is not visible, this does raise the
question of pleural gas/pneumothorax.

## 2023-08-29 IMAGING — DX DG CHEST 1V PORT
1 series · 1 of 1 positions shown · non-contrast
Comparison: May 18, 2021

CLINICAL DATA: A 45-year-old female presents with history of
reported intrathoracic abscess.

EXAM:
PORTABLE CHEST 1 VIEW

[chest ap]
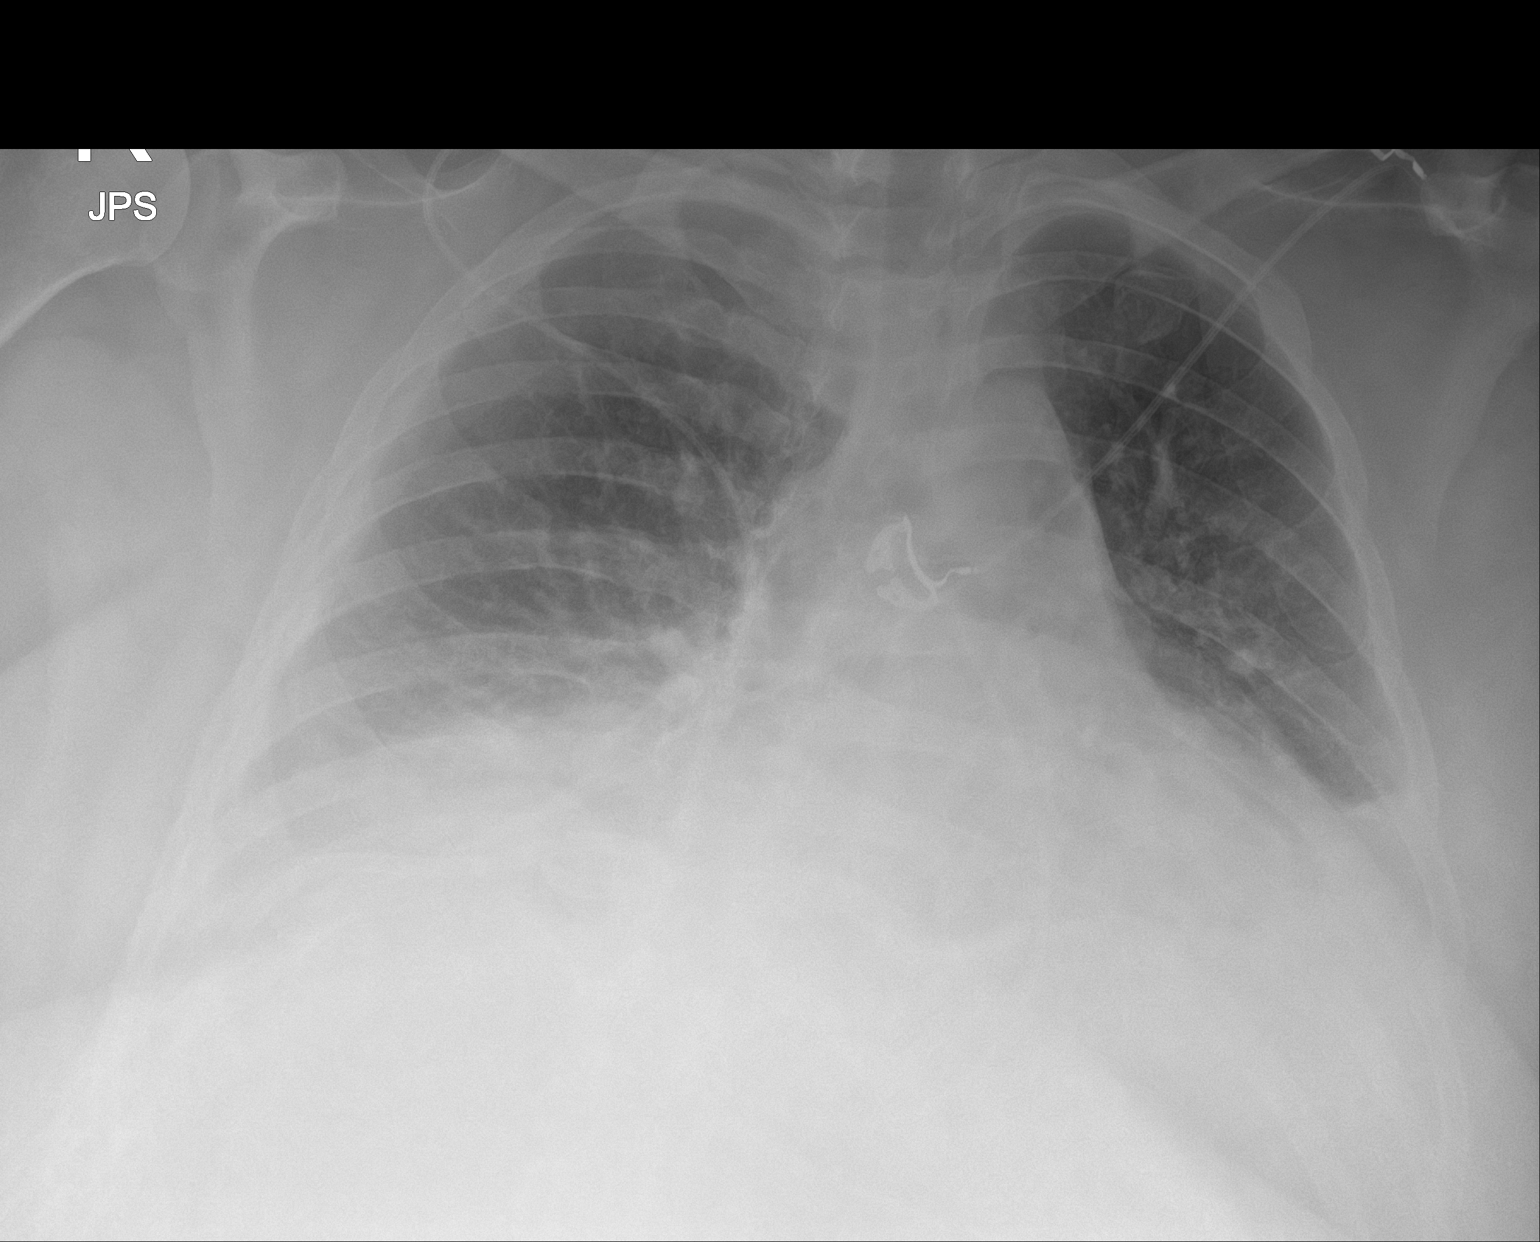

[1 of 1 positions shown; findings below may reference images not displayed]

FINDINGS: Persistent added density in the cardiophrenic recess on the RIGHT
and along the RIGHT hemidiaphragm and RIGHT heart border. A pigtail
drain projects over the RIGHT lower chest in this area medially. EKG
leads project over the chest.

Cardiomediastinal contours are stable. The LEFT basilar
consolidation with blunting of LEFT costodiaphragmatic sulcus is
similar to the prior study.

No visible pneumothorax.

On limited assessment there is no acute skeletal process.
IMPRESSION: 1. Persistent added density in the cardiophrenic recess on the RIGHT
and along the RIGHT hemidiaphragm and RIGHT heart border at the site
of abscess with pigtail drain in place.
2. Bibasilar airspace disease and likely small bilateral effusions
with similar appearance.
3. No visible pneumothorax.

## 2023-08-30 ENCOUNTER — Telehealth (INDEPENDENT_AMBULATORY_CARE_PROVIDER_SITE_OTHER): Admitting: Adult Health

## 2023-08-30 ENCOUNTER — Encounter: Payer: Self-pay | Admitting: Adult Health

## 2023-08-30 VITALS — Ht 64.5 in | Wt 365.0 lb

## 2023-08-30 DIAGNOSIS — F331 Major depressive disorder, recurrent, moderate: Secondary | ICD-10-CM

## 2023-08-30 DIAGNOSIS — F411 Generalized anxiety disorder: Secondary | ICD-10-CM

## 2023-08-30 MED ORDER — SERTRALINE HCL 50 MG PO TABS
50.0000 mg | ORAL_TABLET | Freq: Every day | ORAL | 2 refills | Status: DC
Start: 2023-08-30 — End: 2023-09-24

## 2023-08-30 NOTE — Progress Notes (Signed)
 Virtual Visit via Video Note  I connected with pt @ on 08/30/23 at  2:00 PM EDT by a video enabled telemedicine application and verified that I am speaking with the correct person using two identifiers.   I discussed the limitations of evaluation and management by telemedicine and the availability of in person appointments. The patient expressed understanding and agreed to proceed.  I discussed the assessment and treatment plan with the patient. The patient was provided an opportunity to ask questions and all were answered. The patient agreed with the plan and demonstrated an understanding of the instructions.   The patient was advised to call back or seek an in-person evaluation if the symptoms worsen or if the condition fails to improve as anticipated.  I provided 60 minutes of non-face-to-face time during this encounter.  The patient was located at home.  The provider was located at [facility].   Reagan Camera, NP     [facility] MD/PA/NP Initial Note  08/30/2023 2:58 PM GEORGANNA MAXSON  MRN:  [id number]  Chief Complaint:   HPI:   Patient seen today for initial psychiatric evaluation.   Describes mood today as "not the best". Pleasant. Reports tearfulness. Mood symptoms - report depression, anxiety and irritability. Reports decreased interest and motivation. Reports situational stressors - end of a 23 year relationship, health issues, caring for mother. Reports a MI at 18. Denies panic attacks. Reports worry, rumination and over thinking. Denies obsessive thoughts or acts. Reports mood is lower". Stating "I haven't been doing too good". Reports currently taking Wellbutrin  SR 150mg  twice daily and Xanax  as needed. Feels like current medication is helpful, but she struggles with mood stability. Reports taking medications as prescribed.  Energy levels lower. Active, does not have a regular exercise routine - "not able to".  Enjoys some usual interests and activities. Single.  Lives alone. Spending time with family. Appetite adequate. Reports weight gain. Reports sleep issues. Recent sleep study shows severe sleep apnea - awaiting machine. Averages 3 to 4 hours. Reports some daytime napping. Reports focus and concentration difficulties. Completing tasks. Managing some aspects of household. Owns a Surveyor, quantity. Goes to mother 5 days a week to help her out - medical issues. Denies SI or HI.  Denies AH or VH. Denies self harm. Denies substance use.  Therapist - Robbert Childes  Previous medication trials: Xanax , Wellbutrin  SR, Prozac , Gabapentin   Visit Diagnosis:    ICD-10-CM   1. Moderate episode of recurrent major depressive disorder (HCC)  F33.1 sertraline  (ZOLOFT ) 50 MG tablet    2. Generalized anxiety disorder  F41.1 sertraline  (ZOLOFT ) 50 MG tablet      Past Psychiatric History: Denies psychiatric hospitalization.   Past Medical History:  Past Medical History:  Diagnosis Date   Anxiety    Back pain    CAD (coronary artery disease) 07/2011   cardiologist--- dr Lavonne Prairie--   a. 07/2011 NSTEMI/Cath: RCA 99%m, otw nl cors, EF 50%, inf hk -> RCA stented with 3.0x85mm Promus DES   Depression    Fatty liver    GERD (gastroesophageal reflux disease)    Heart disease    History of non-ST elevation myocardial infarction (NSTEMI) 07/2011   s/p PCI and stenting   History of sepsis 04/2021   hospital admission---  sepsis due to infected mesh post op diaphramatic hernia repair,  intrathoracic abscess w/ pleural effusion   Hyperlipidemia    Hypertension    Morgagni hernia    followed by dr h. littlefoot (cardiac /  thoracic surgeon)  lov note in epic 10-17-2021 pt released prn//    a. Discovered incidentally on CT 07/2011  (diaphragmatic hernia);    02-24-2021  s/p repair with mesh;  hernia recurrence 04-04-2021/  02/ 2023 infected mesh w/ sepsis,  s/p unroof abscess/ pleural irrigation system placed/   re-do hernia repair 06-02-2021   Myocardial infarction [facility])     2013   Obesity    Obesity    S/P drug eluting coronary stent placement 08/10/2011   DES x1  to  mRCA   Sinus bradycardia    a. Nocturnal, asymptomatic   Sun allergy    per pt dx via testing    Past Surgical History:  Procedure Laterality Date   CARDIAC CATHETERIZATION  08/10/2011   left heart, with angiogram; DES mid RCA   COLONOSCOPY WITH PROPOFOL  N/A 02/26/2022   Procedure: COLONOSCOPY WITH PROPOFOL ;  Surgeon: Daina Drum, MD;  Location: [facility];  Service: Gastroenterology;  Laterality: N/A;   DIAPHRAGMATIC HERNIA REPAIR  06/02/2021   with mesh removal due to sepsis   DILATATION & CURETTAGE/HYSTEROSCOPY WITH MYOSURE N/A 11/24/2021   Procedure: DILATATION & CURETTAGE/HYSTEROSCOPY;  Surgeon: Wanita Gutta, MD;  Location: [facility];  Service: Gynecology;  Laterality: N/A;   HEMOSTASIS CLIP PLACEMENT  02/26/2022   Procedure: HEMOSTASIS CLIP PLACEMENT;  Surgeon: Daina Drum, MD;  Location: [facility];  Service: Gastroenterology;;   INTERCOSTAL NERVE BLOCK Right 05/22/2021   Procedure: INTERCOSTAL NERVE BLOCK;  Surgeon: Hilarie Lovely, MD;  Location: [facility] OR;  Service: Thoracic;  Laterality: Right;   INTRAUTERINE DEVICE (IUD) INSERTION N/A 11/24/2021   Procedure: Mirena  INTRAUTERINE DEVICE (IUD) INSERTION;  Surgeon: Wanita Gutta, MD;  Location: [facility] [place];  Service: Gynecology;  Laterality: N/A;   IR RADIOLOGIST EVAL & MGMT  05/05/2021   LEFT HEART CATHETERIZATION WITH CORONARY ANGIOGRAM N/A 08/10/2011   Procedure: LEFT HEART CATHETERIZATION WITH CORONARY ANGIOGRAM;  Surgeon: Odie Benne, MD;  Location: [facility] CATH LAB;  Service: Cardiovascular;  Laterality: N/A;   OPERATIVE ULTRASOUND N/A 11/24/2021   Procedure: OPERATIVE ULTRASOUND;  Surgeon: Wanita Gutta, MD;  Location: [facility];  Service: Gynecology;  Laterality: N/A;   POLYPECTOMY  02/26/2022   Procedure: POLYPECTOMY;  Surgeon: Daina Drum, MD;  Location: [facility];  Service: Gastroenterology;;   TONSILLECTOMY     child   XI ROBOTIC ASSISTED REPAIR OF DIAPHRAGMATIC HERNIA  02/24/2021   @[facility]   by dr Renette Carton;   w/ mesh    Family Psychiatric History: Reports family history of mental illness - grandfather with depression.   Family History:  Family History  Problem Relation Age of Onset   Diabetes Mother    Coronary artery disease Mother 30        stent   Heart attack Mother    Heart disease Mother    Hyperlipidemia Mother    Obesity Mother    Colon polyps Mother    Hypertension Father    Arrhythmia Father    Depression Father    Coronary artery disease Maternal Aunt 88       (Mother's twin sister)   Lung cancer Maternal Aunt    Heart attack Maternal Aunt        mother's twin   Colon polyps Maternal Aunt    Colon cancer Maternal Uncle    Coronary artery disease Maternal Uncle 46   Heart attack Maternal Uncle    Breast cancer Maternal Grandmother  Breast cancer Cousin 38   Cancer Neg Hx    Early death Neg Hx    Kidney disease Neg Hx    Stroke Neg Hx    Alcohol abuse Neg Hx    Drug abuse Neg Hx     Social History:  Social History   Socioeconomic History   Marital status: Significant Other    Spouse name: Not on file   Number of children: 0   Years of education: Not on file   Highest education level: Not on file  Occupational History   Occupation: Realtor  Tobacco Use   Smoking status: Former    Current packs/day: 0.00    Average packs/day: 1 pack/day for 18.0 years (18.0 ttl pk-yrs)    Types: Cigarettes    Start date: 08/08/1993    Quit date: 08/09/2011    Years since quitting: 12.0   Smokeless tobacco: Never   Tobacco comments:    2013  Vaping Use   Vaping status: Never Used  Substance and Sexual Activity   Alcohol use: Yes    Comment: occasionally   Drug use: Never   Sexual activity: Yes    Birth control/protection: I.U.D.  Other Topics Concern   Not on file  Social History  Narrative   Lives alone.  Occasional EtOH.  Works as a Veterinary surgeon.   Social Drivers of Corporate investment banker Strain: Low Risk  (06/20/2023)   Overall Financial Resource Strain (CARDIA)    Difficulty of Paying Living Expenses: Not hard at all  Food Insecurity: No Food Insecurity (06/20/2023)   Hunger Vital Sign    Worried About Running Out of Food in the Last Year: Never true    Ran Out of Food in the Last Year: Never true  Transportation Needs: Patient Declined (07/30/2023)   PRAPARE - Transportation    Lack of Transportation (Medical): Patient declined    Lack of Transportation (Non-Medical): Patient declined  Physical Activity: Inactive (07/30/2023)   Exercise Vital Sign    Days of Exercise per Week: 0 days    Minutes of Exercise per Session: 0 min  Stress: No Stress Concern Present (06/20/2023)   [employer] of Occupational Health - Occupational Stress Questionnaire    Feeling of Stress : Not at all  Social Connections: Socially Isolated (07/30/2023)   Social Connection and Isolation Panel [NHANES]    Frequency of Communication with Friends and Family: Three times a week    Frequency of Social Gatherings with Friends and Family: Three times a week    Attends Religious Services: Never    Active Member of Clubs or Organizations: No    Attends Banker Meetings: Never    Marital Status: Separated    Allergies:  Allergies  Allergen Reactions   Wound Dressing Adhesive Dermatitis and Rash    Metabolic Disorder Labs: Lab Results  Component Value Date   HGBA1C 6.3 (H) 01/18/2023   MPG 131 09/15/2022   MPG 111 08/11/2021   No results found for: "PROLACTIN" Lab Results  Component Value Date   CHOL 143 01/18/2023   TRIG 78 01/18/2023   HDL 55 01/18/2023   CHOLHDL 3.0 09/15/2022   VLDL 9 06/14/2014   LDLCALC 73 01/18/2023   LDLCALC 82 09/15/2022   Lab Results  Component Value Date   TSH 1.010 01/18/2023   TSH 1.94 09/15/2022    Therapeutic Level  Labs: No results found for: "LITHIUM" No results found for: "VALPROATE" No results found for: "CBMZ"  Current  Medications: Current Outpatient Medications  Medication Sig Dispense Refill   sertraline  (ZOLOFT ) 50 MG tablet Take 1 tablet (50 mg total) by mouth daily. 30 tablet 2   acetaminophen  (TYLENOL ) 500 MG tablet Take 500 mg by mouth every 8 (eight) hours as needed for moderate pain.     ALPRAZolam  (XANAX ) 1 MG tablet Take 1 tablet by mouth twice daily as needed for anxiety 30 tablet 1   aspirin  81 MG tablet Take 81 mg by mouth daily.     buPROPion  (WELLBUTRIN  SR) 150 MG 12 hr tablet Take 1 tablet (150 mg total) by mouth 2 (two) times daily. 60 tablet 1   cetirizine  (ZYRTEC ) 10 MG tablet Take 1 tablet (10 mg total) by mouth daily as needed for allergies. 90 tablet 3   clotrimazole -betamethasone  (LOTRISONE ) cream Apply 1 Application topically 2 (two) times daily. 60 g 1   ezetimibe  (ZETIA ) 10 MG tablet Take 1 tablet (10 mg total) by mouth daily. 90 tablet 3   levonorgestrel  (MIRENA ) 20 MCG/DAY IUD 1 each by Intrauterine route once.     lisinopril  (ZESTRIL ) 10 MG tablet Take 1 tablet (10 mg total) by mouth daily. 90 tablet 3   Multiple Vitamin (MULTIVITAMIN) capsule Take 1 capsule by mouth daily.     nitroGLYCERIN  (NITROSTAT ) 0.4 MG SL tablet Place 1 tablet (0.4 mg total) under the tongue every 5 (five) minutes as needed for chest pain. (Patient not taking: Reported on 07/30/2023) 25 tablet 3   omeprazole  (PRILOSEC) 20 MG capsule Take 1 capsule (20 mg total) by mouth daily. 90 capsule 1   rosuvastatin  (CRESTOR ) 40 MG tablet Take 1 tablet (40 mg total) by mouth daily. 90 tablet 3   Vitamin D , Ergocalciferol , (DRISDOL ) 1.25 MG (50000 UNIT) CAPS capsule Take 1 capsule (50,000 Units total) by mouth every 7 (seven) days. 5 capsule 0   No current facility-administered medications for this visit.    Medication Side Effects: none  Orders placed this visit:  No orders of the defined types were  placed in this encounter.   Psychiatric Specialty Exam:  Review of Systems  Musculoskeletal:  Negative for gait problem.  Neurological:  Negative for tremors.  Psychiatric/Behavioral:         Please refer to HPI    Height 5' 4.5" (1.638 m), weight (!) 365 lb (165.6 kg).Body mass index is 61.68 kg/m.  General Appearance: Casual and Neat  Eye Contact:  Good  Speech:  Clear and Coherent and Normal Rate  Volume:  Normal  Mood:  Anxious and Depressed  Affect:  Appropriate and Congruent  Thought Process:  Coherent and Descriptions of Associations: Intact  Orientation:  Full (Time, Place, and Person)  Thought Content: Logical   Suicidal Thoughts:  No  Homicidal Thoughts:  No  Memory:  WNL  Judgement:  Good  Insight:  Good  Psychomotor Activity:  Normal  Concentration:  Concentration: Good and Attention Span: Good  Recall:  Good  Fund of Knowledge: Good  Language: Good  Assets:  Communication Skills Desire for Improvement Financial Resources/Insurance Housing Intimacy Leisure Time Physical Health Resilience Social Support Talents/Skills Transportation Vocational/Educational  ADL's:  Intact  Cognition: WNL  Prognosis:  Good   Screenings:  GAD-7    Flowsheet Row Office Visit from 10/20/2017 in [facility]  Total GAD-7 Score 13      PHQ2-9    Flowsheet Row Office Visit from 07/30/2023 in [facility], [place] Office Visit from 06/21/2023 in [place]  Effie Grandchild, MD Office Visit from 09/17/2022 in [place], [place] Office Visit from 07/29/2021 in [facility] [place]. [facility] Office Visit from 07/01/2021 in [facility] - [facility] Of [place]. [facility]  PHQ-2 Total Score 1 0 0 0 0  PHQ-9 Total Score -- 2 -- -- --      Flowsheet Row UC from 02/16/2022 in [facility]) Admission (Discharged) from 11/24/2021 in [facility] ED  to Hosp-Admission (Discharged) from 05/16/2021 in [place] [facility] CV PROGRESSIVE CARE  C-SSRS RISK CATEGORY No Risk No Risk No Risk       Receiving Psychotherapy: Yes [name].  Treatment Plan/Recommendations:   Plan:  PDMP reviewed   Wellbutrin  SR 150mg  BID Xanax  1mg  BID - taking as needed  Add Zoloft  50mg  daily  RTC  weeks  60 minutes spent dedicated to the care of this patient on the date of this encounter to include pre-visit review of records, ordering of medication, post visit documentation, and face-to-face time with the patient discussing depression and anxiety. Discussed continuing current medication regimen.  Patient advised to contact office with any questions, adverse effects, or acute worsening in signs and symptoms.   Ziggy Chanthavong N Netasha Wehrli, NP

## 2023-08-31 DIAGNOSIS — G4730 Sleep apnea, unspecified: Secondary | ICD-10-CM | POA: Insufficient documentation

## 2023-08-31 NOTE — Progress Notes (Unsigned)
  Cardiology Office Note:   Date:  09/02/2023  ID:  Rhonda Colon, DOB [birth year], MRN [id number] PCP: [name], MD  [facility] Providers Cardiologist:  Eilleen Grates, MD {  History of Present Illness:   Rhonda Colon is a 48 y.o. female who presents for followup after a previous MI . She is battling depression and is now actively seeing a psychiatrist and therapist and has been started on 2 new antidepressants.  She is having some lower extremity swelling.  She does have some dyspnea on exertion.  She is not having any new chest pressure, neck or arm discomfort.  The shortness of breath is really chronic and was evaluated in the fall with normal chest x-ray.  She would not change since then.  She has not been physically active because of her depression.  ROS: As stated in the HPI and negative for all other systems.  Studies Reviewed:    EKG:    NA  Risk Assessment/Calculations:           Physical Exam:   VS:  BP (!) 140/84 (BP Location: Left Arm, Patient Position: Sitting, Cuff Size: Large)   Pulse (!) 111   Ht 5' 4.5" (1.638 m)   Wt (!) 361 lb 6.4 oz (163.9 kg)   SpO2 96%   BMI 61.08 kg/m    Wt Readings from Last 3 Encounters:  09/02/23 (!) 361 lb 6.4 oz (163.9 kg)  07/30/23 (!) 369 lb 12.8 oz (167.7 kg)  06/21/23 (!) 359 lb 12.8 oz (163.2 kg)     GEN: Well nourished, well developed in no acute distress NECK: No JVD; No carotid bruits CARDIAC: RRR, no murmurs, rubs, gallops RESPIRATORY:  Clear to auscultation without rales, wheezing or rhonchi  ABDOMEN: Soft, non-tender, non-distended EXTREMITIES:  No edema; No deformity   ASSESSMENT AND PLAN:   CAD:  The patient has no new sypmtoms.  No further cardiovascular testing is indicated.  We will continue with aggressive risk reduction and meds as listed.   HL:     Her LDL was 73.  She is getting get another lipid profile at the end of this month.  The goal should be an LDL in the 50s.  SOB:   I think this  is related to weight and deconditioning.  As she hopefully gets some weight loss as below and control of her depression with increased physical activity we will see if her shortness of breath gets improved.  I will check a BNP although was normal in the fall.  I am not strongly suspecting heart failure or an anginal equivalent.  OBESITY:    I am going to see if I can get her on a GLP-1 receptor agonist.     SLEEP APNEA:   She had a positive study.  She is awaiting CPAP which should help.    Follow up with me in 6 months.  Signed, Eilleen Grates, MD

## 2023-08-31 NOTE — Telephone Encounter (Signed)
Upon patient request DME selection is [facility]. Patient understands he will be contacted by [facility] to set up his cpap. Patient understands to call if [facility] does not contact him with new setup in a timely manner. Patient understands they will be called once confirmation has been received from [facility]/ that they have received their new machine to schedule 10 week follow up appointment.   [facility] notified of new cpap order  Please add to airview Patient was grateful for the call and thanked me. 

## 2023-09-01 IMAGING — DX DG CHEST 1V PORT
1 series · 1 of 1 positions shown · non-contrast
Comparison: Radiograph 05/19/2021

CLINICAL DATA: Abdominal pain

EXAM:
PORTABLE CHEST 1 VIEW

[chest ap]
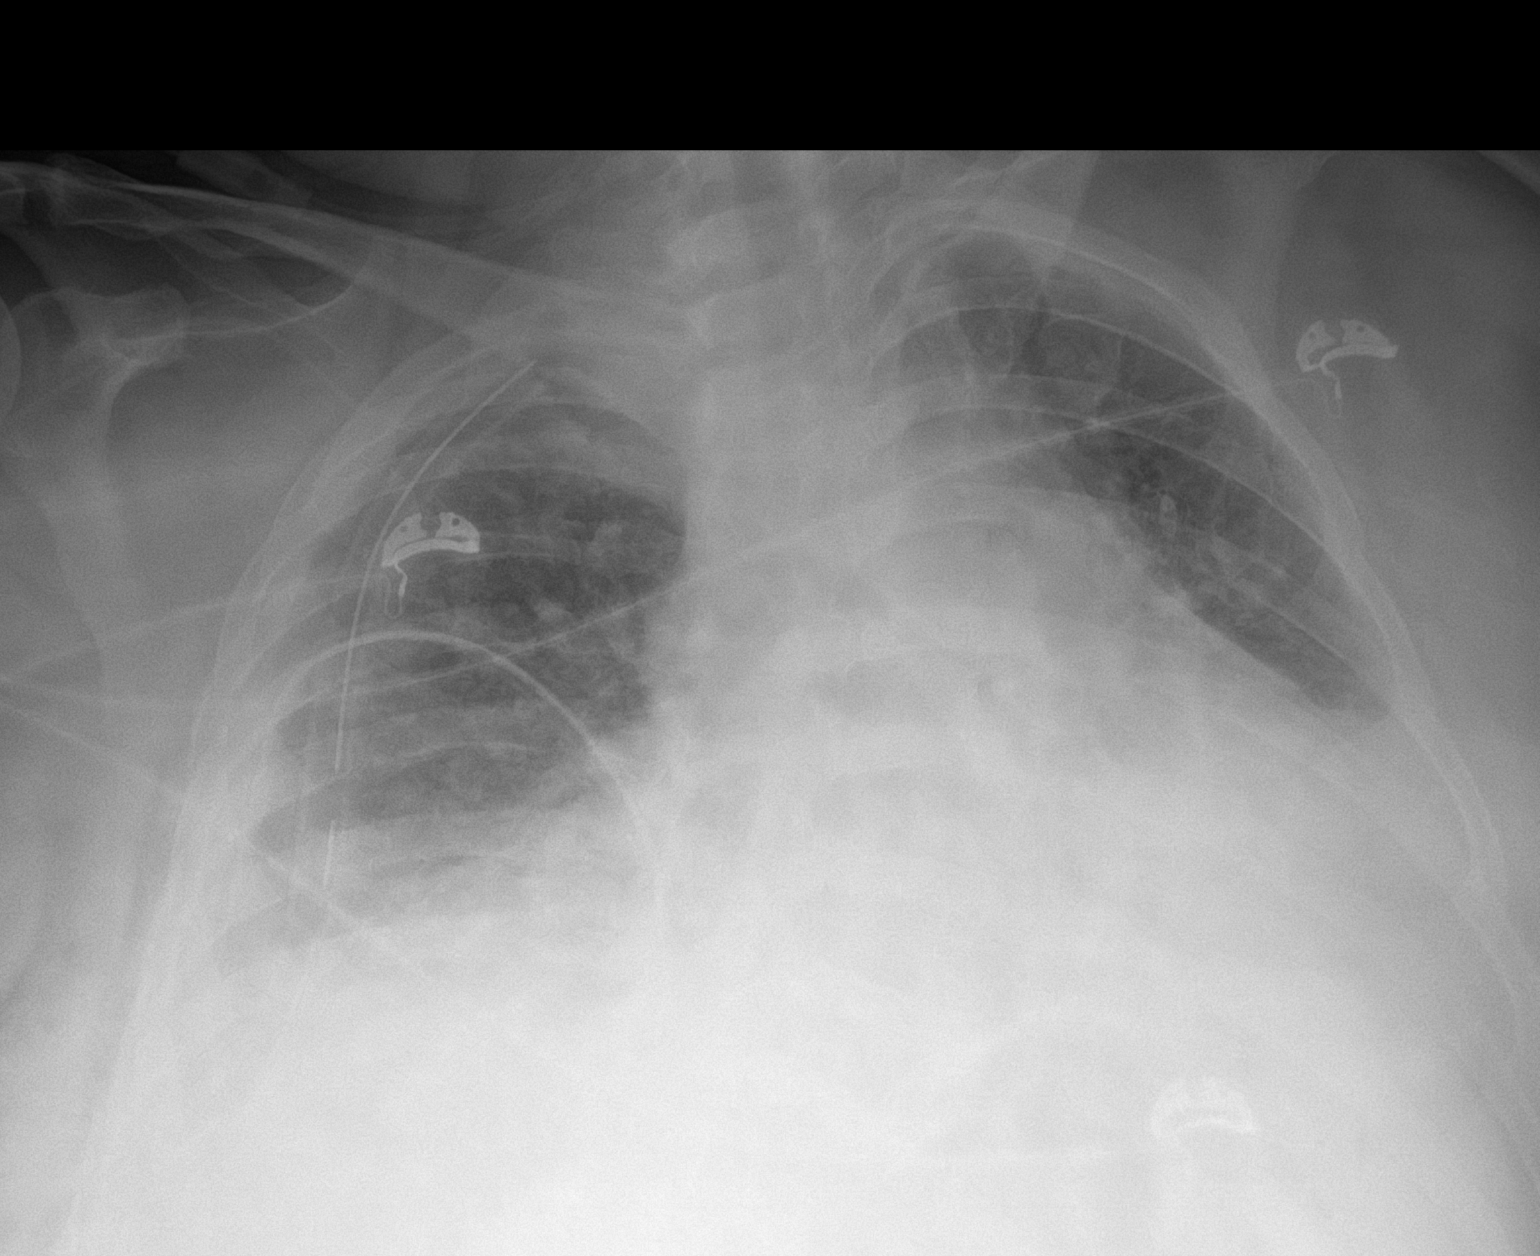

[1 of 1 positions shown; findings below may reference images not displayed]

FINDINGS: Unchanged enlarged cardiac silhouette. There is a new right apical
chest tube in place. Persistent small right pleural effusion and
right basilar opacities. Unchanged small left pleural effusion and
adjacent left basilar opacities. No visible pneumothorax. No acute
osseous abnormality.
IMPRESSION: New right apical chest tube. Persistent small right pleural effusion
and adjacent basilar consolidation. No visible pneumothorax.

Unchanged small left pleural effusion and adjacent left basilar
opacities.

## 2023-09-02 ENCOUNTER — Ambulatory Visit (INDEPENDENT_AMBULATORY_CARE_PROVIDER_SITE_OTHER): Admitting: Psychology

## 2023-09-02 ENCOUNTER — Encounter: Payer: Self-pay | Admitting: Cardiology

## 2023-09-02 ENCOUNTER — Ambulatory Visit: Attending: Cardiology | Admitting: Cardiology

## 2023-09-02 ENCOUNTER — Telehealth: Payer: Self-pay | Admitting: Pharmacy Technician

## 2023-09-02 ENCOUNTER — Other Ambulatory Visit (HOSPITAL_COMMUNITY): Payer: Self-pay

## 2023-09-02 VITALS — BP 140/84 | HR 111 | Ht 64.5 in | Wt 361.4 lb

## 2023-09-02 DIAGNOSIS — I2510 Atherosclerotic heart disease of native coronary artery without angina pectoris: Secondary | ICD-10-CM

## 2023-09-02 DIAGNOSIS — G4730 Sleep apnea, unspecified: Secondary | ICD-10-CM

## 2023-09-02 DIAGNOSIS — E785 Hyperlipidemia, unspecified: Secondary | ICD-10-CM | POA: Diagnosis not present

## 2023-09-02 DIAGNOSIS — Z6841 Body Mass Index (BMI) 40.0 and over, adult: Secondary | ICD-10-CM

## 2023-09-02 DIAGNOSIS — R0602 Shortness of breath: Secondary | ICD-10-CM

## 2023-09-02 DIAGNOSIS — F331 Major depressive disorder, recurrent, moderate: Secondary | ICD-10-CM

## 2023-09-02 IMAGING — DX DG CHEST 1V PORT
1 series · 1 of 1 positions shown · non-contrast
Comparison: 05/22/2021

CLINICAL DATA: Chest tube present status post lung surgery. Sore
chest today.

EXAM:
PORTABLE CHEST 1 VIEW

[chest]
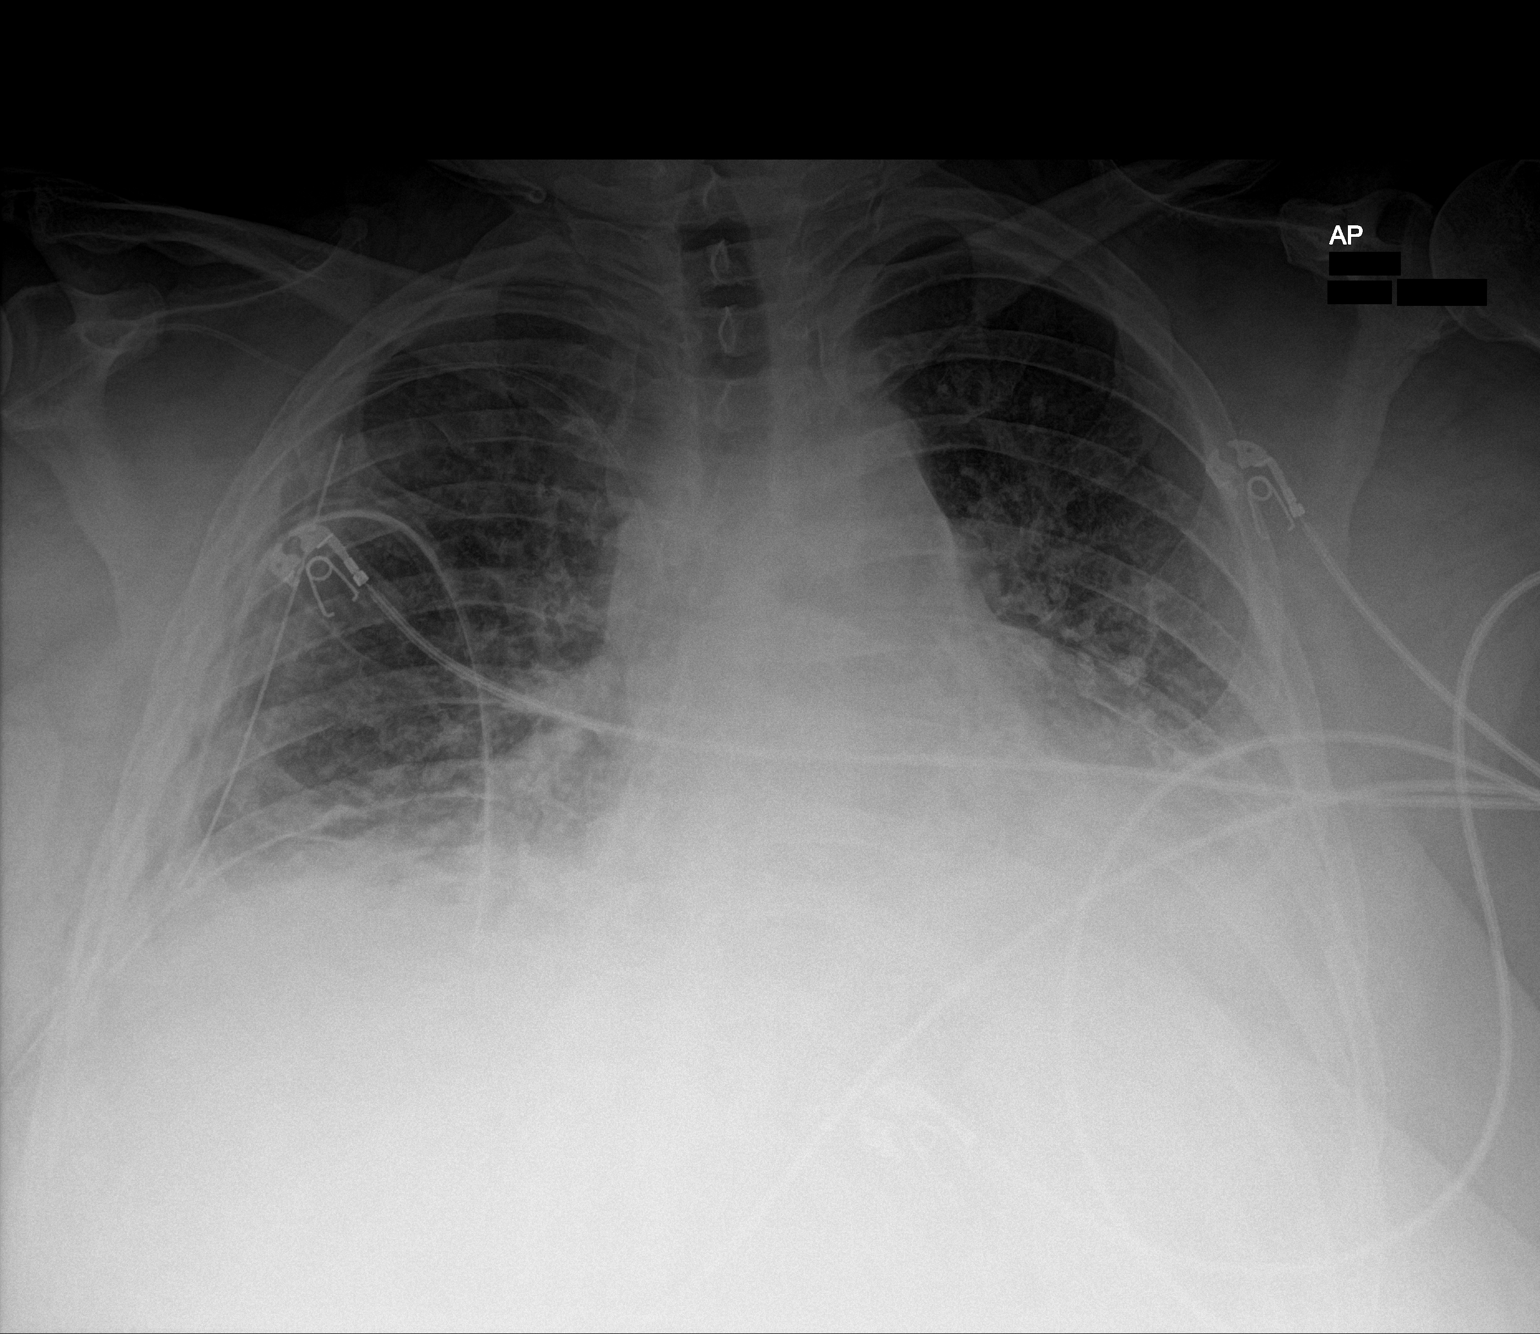

[1 of 1 positions shown; findings below may reference images not displayed]

FINDINGS: Right hemithorax chest tube is vertically oriented with the tip
overlying the superolateral right hemithorax. This appears mildly
retracted more inferiorly compared to prior.

Right upper extremity PICC is visualized central as the mid superior
vena cava however the tip is not definitively identified.

Moderately decreased lung volumes with bibasilar bronchovascular
crowding.

Likely small left left-greater-than-right pleural effusions and
bibasilar heterogeneous airspace opacities.

No definite pneumothorax.  No acute osseous abnormality.

Cardiac silhouette is again moderately to markedly enlarged.
Mediastinal contours are grossly within normal limits.
IMPRESSION: :
IMPRESSION: 1. Right hemithorax chest tube appears mildly retracted with the tip
overlying the superolateral aspect of the right hemithorax.
2. No significant change in low lung volumes and small
left-greater-than-right pleural effusions with bibasilar airspace
opacities.

## 2023-09-02 MED ORDER — ROSUVASTATIN CALCIUM 40 MG PO TABS: 40.0000 mg | ORAL_TABLET | Freq: Every day | ORAL | 3 refills | Status: AC

## 2023-09-02 MED ORDER — LISINOPRIL 10 MG PO TABS: 10.0000 mg | ORAL_TABLET | Freq: Every day | ORAL | 3 refills | Status: AC

## 2023-09-02 MED ORDER — EZETIMIBE 10 MG PO TABS: 10.0000 mg | ORAL_TABLET | Freq: Every day | ORAL | 3 refills | Status: AC

## 2023-09-02 MED ORDER — NITROGLYCERIN 0.4 MG SL SUBL: 0.4000 mg | SUBLINGUAL_TABLET | SUBLINGUAL | 3 refills | Status: AC | PRN
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
## 2023-09-02 NOTE — Telephone Encounter (Signed)
 Pharmacy Patient Advocate Encounter   Received notification from staff-chris that prior authorization for zepbound is required/requested.   Insurance verification completed.   The patient is insured through [facility] .   Per test claim: PA required; PA submitted to above mentioned insurance via CoverMyMeds Key/confirmation #/EOC [id number] Status is pending

## 2023-09-02 NOTE — Patient Instructions (Signed)
 Medication Instructions:  Your physician recommends that you continue on your current medications as directed. Please refer to the Current Medication list given to you today.  *If you need a refill on your cardiac medications before your next appointment, please call your pharmacy*  Lab Work: Lp(a) at your primary care provider's office If you have labs (blood work) drawn today and your tests are completely normal, you will receive your results only by: MyChart Message (if you have MyChart) OR A paper copy in the mail If you have any lab test that is abnormal or we need to change your treatment, we will call you to review the results.  Testing/Procedures: NONE  Follow-Up: At [facility], you and your health needs are our priority.  As part of our continuing mission to provide you with exceptional heart care, our providers are all part of one team.  This team includes your primary Cardiologist (physician) and Advanced Practice Providers or APPs (Physician Assistants and Nurse Practitioners) who all work together to provide you with the care you need, when you need it.  Your next appointment:   6 months  Provider:   Eilleen Grates, MD    We recommend signing up for the patient portal called "MyChart".  Sign up information is provided on this After Visit Summary.  MyChart is used to connect with patients for Virtual Visits (Telemedicine).  Patients are able to view lab/test results, encounter notes, upcoming appointments, etc.  Non-urgent messages can be sent to your provider as well.   To learn more about what you can do with MyChart, go to [email or web address].

## 2023-09-02 NOTE — Telephone Encounter (Signed)
 Pavero, Christopher, [facility]  staff Please run PA for [facility] and/or Zepbound   Both wegovy and zepbound said plan exclusion on test claim. I ran pa for zepbound anyways  Pharmacy Patient Advocate Encounter   Received notification from staff-chris that prior authorization for zepbound 2.5mg  is required/requested.   Insurance verification completed.   The patient is insured through [employer] .   Per test claim: PA required; PA started via CoverMyMeds. KEY [id number] . Waiting for clinical questions to populate.

## 2023-09-03 ENCOUNTER — Other Ambulatory Visit (HOSPITAL_COMMUNITY): Payer: Self-pay

## 2023-09-03 NOTE — Telephone Encounter (Signed)
 Pharmacy Patient Advocate Encounter  Received notification from [facility] that Prior Authorization for Zepbound has been DENIED.  Full denial letter will be uploaded to the media tab. See denial reason below.   PA #/Case ID/Reference #: [id number]

## 2023-09-09 ENCOUNTER — Ambulatory Visit: Admitting: Psychology

## 2023-09-16 ENCOUNTER — Other Ambulatory Visit: Payer: Self-pay | Admitting: Internal Medicine

## 2023-09-16 ENCOUNTER — Ambulatory Visit (INDEPENDENT_AMBULATORY_CARE_PROVIDER_SITE_OTHER): Admitting: Psychology

## 2023-09-16 DIAGNOSIS — F331 Major depressive disorder, recurrent, moderate: Secondary | ICD-10-CM | POA: Diagnosis not present

## 2023-09-16 DIAGNOSIS — F411 Generalized anxiety disorder: Secondary | ICD-10-CM
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
## 2023-09-17 ENCOUNTER — Other Ambulatory Visit: Payer: 59

## 2023-09-17 DIAGNOSIS — E785 Hyperlipidemia, unspecified: Secondary | ICD-10-CM | POA: Diagnosis not present

## 2023-09-17 DIAGNOSIS — E119 Type 2 diabetes mellitus without complications: Secondary | ICD-10-CM | POA: Diagnosis not present

## 2023-09-17 DIAGNOSIS — Z0000 Encounter for general adult medical examination without abnormal findings: Secondary | ICD-10-CM

## 2023-09-18 LAB — COMPLETE METABOLIC PANEL WITHOUT GFR
AG Ratio: 1.6 (calc) (ref 1.0–2.5)
ALT: 38 U/L — ABNORMAL HIGH (ref 6–29)
AST: 44 U/L — ABNORMAL HIGH (ref 10–35)
Albumin: 4.3 g/dL (ref 3.6–5.1)
Alkaline phosphatase (APISO): 90 U/L (ref 37–153)
BUN: 10 mg/dL (ref 7–25)
CO2: 24 mmol/L (ref 20–32)
Calcium: 9.4 mg/dL (ref 8.6–10.4)
Chloride: 102 mmol/L (ref 98–110)
Creat: 0.88 mg/dL (ref 0.50–1.03)
Globulin: 2.7 g/dL (ref 1.9–3.7)
Glucose, Bld: 126 mg/dL — ABNORMAL HIGH (ref 65–99)
Potassium: 4.7 mmol/L (ref 3.5–5.3)
Sodium: 138 mmol/L (ref 135–146)
Total Bilirubin: 0.5 mg/dL (ref 0.2–1.2)
Total Protein: 7 g/dL (ref 6.1–8.1)

## 2023-09-18 LAB — CBC WITH DIFFERENTIAL/PLATELET
Absolute Lymphocytes: 3184 {cells}/uL (ref 850–3900)
Absolute Monocytes: 585 {cells}/uL (ref 200–950)
Basophils Absolute: 63 {cells}/uL (ref 0–200)
Basophils Relative: 0.8 %
Eosinophils Absolute: 142 {cells}/uL (ref 15–500)
Eosinophils Relative: 1.8 %
HCT: 48.7 % — ABNORMAL HIGH (ref 35.0–45.0)
Hemoglobin: 16 g/dL — ABNORMAL HIGH (ref 11.7–15.5)
MCH: 30.4 pg (ref 27.0–33.0)
MCHC: 32.9 g/dL (ref 32.0–36.0)
MCV: 92.4 fL (ref 80.0–100.0)
MPV: 11.6 fL (ref 7.5–12.5)
Monocytes Relative: 7.4 %
Neutro Abs: 3926 {cells}/uL (ref 1500–7800)
Neutrophils Relative %: 49.7 %
Platelets: 288 10*3/uL (ref 140–400)
RBC: 5.27 10*6/uL — ABNORMAL HIGH (ref 3.80–5.10)
RDW: 12.8 % (ref 11.0–15.0)
Total Lymphocyte: 40.3 %
WBC: 7.9 10*3/uL (ref 3.8–10.8)

## 2023-09-18 LAB — TSH: TSH: 1.27 m[IU]/L

## 2023-09-18 LAB — HEMOGLOBIN A1C
Hgb A1c MFr Bld: 6.9 % — ABNORMAL HIGH (ref <5.7)
Mean Plasma Glucose: 151 mg/dL
eAG (mmol/L): 8.4 mmol/L

## 2023-09-18 LAB — LIPID PANEL
Cholesterol: 146 mg/dL (ref <200)
HDL: 48 mg/dL — ABNORMAL LOW (ref >=50)
LDL Cholesterol (Calc): 83 mg/dL
Non-HDL Cholesterol (Calc): 98 mg/dL (ref <130)
Total CHOL/HDL Ratio: 3 (calc) (ref <5.0)
Triglycerides: 74 mg/dL (ref <150)

## 2023-09-20 ENCOUNTER — Telehealth: Admitting: Adult Health

## 2023-09-20 ENCOUNTER — Encounter: Payer: Self-pay | Admitting: Adult Health

## 2023-09-20 ENCOUNTER — Other Ambulatory Visit: Payer: Self-pay | Admitting: Cardiology

## 2023-09-20 ENCOUNTER — Ambulatory Visit: Payer: Self-pay | Admitting: Cardiology

## 2023-09-20 DIAGNOSIS — F331 Major depressive disorder, recurrent, moderate: Secondary | ICD-10-CM

## 2023-09-20 DIAGNOSIS — F411 Generalized anxiety disorder: Secondary | ICD-10-CM | POA: Diagnosis not present

## 2023-09-20 DIAGNOSIS — Z6841 Body Mass Index (BMI) 40.0 and over, adult: Secondary | ICD-10-CM

## 2023-09-20 DIAGNOSIS — E785 Hyperlipidemia, unspecified: Secondary | ICD-10-CM

## 2023-09-20 DIAGNOSIS — I2510 Atherosclerotic heart disease of native coronary artery without angina pectoris: Secondary | ICD-10-CM

## 2023-09-20 LAB — LIPOPROTEIN A (LPA): Lipoprotein (a): 222.8 nmol/L — ABNORMAL HIGH (ref <75.0)

## 2023-09-20 MED ORDER — WEGOVY 0.25 MG/0.5ML ~~LOC~~ SOAJ
0.2500 mg | SUBCUTANEOUS | 0 refills | Status: DC
Start: 2023-09-20 — End: 2023-10-12

## 2023-09-20 NOTE — Progress Notes (Signed)
 Rhonda Colon [id number] October 17, 1975 48 y.o.  Virtual Visit via Video Note  I connected with pt @ on 09/20/23 at  5:00 PM EDT by a video enabled telemedicine application and verified that I am speaking with the correct person using two identifiers.   I discussed the limitations of evaluation and management by telemedicine and the availability of in person appointments. The patient expressed understanding and agreed to proceed.  I discussed the assessment and treatment plan with the patient. The patient was provided an opportunity to ask questions and all were answered. The patient agreed with the plan and demonstrated an understanding of the instructions.   The patient was advised to call back or seek an in-person evaluation if the symptoms worsen or if the condition fails to improve as anticipated.  I provided 20 minutes of non-face-to-face time during this encounter.  The patient was located at home.  The provider was located at [facility].   Angeline LOISE Sayers, NP   Subjective:   Patient ID:  Rhonda Colon is a 48 y.o. (DOB 05-Aug-1975) female.  Chief Complaint: No chief complaint on file.   HPI Rhonda Colon presents for follow-up of MDD and GAD.  Describes mood today as better. Pleasant. Reports decreased tearfulness. Mood symptoms - reports decreased depression and irritability. Reports anxiety - apprehensive. Reports improved interest and motivation. Reports ongoing situational stressors - end of a 23 year relationship, health issues, caring for mother. Reports a MI at 48. Reports a few panic attacks. Reports worry, rumination and over thinking. Denies obsessive thoughts or acts. Reports mood is has improved with recent medication changes. Stating I feel like I'm making a little progress. Reports currently taking Wellbutrin  SR 150mg  daily, Zoloft  50mg  daily and Xanax  as needed. Feels like current medication is helpful. Reports taking medications as prescribed.   Energy levels lower. Active, does not have a regular exercise routine - not able to.  Enjoys some usual interests and activities. Single. Lives alone. Spending time with family. Appetite adequate. Reports weight gain. Recent sleep study shows severe sleep apnea - awaiting machine/class. Averages 4 to 5 hours. Reports some daytime napping. Reports focus and concentration difficulties. Completing tasks. Managing some aspects of household. Owns a Surveyor, quantity. Goes to mother 5 days a week to help her out - medical issues. Denies SI or HI.  Denies AH or VH. Denies self harm. Denies substance use.  Therapist - [facility]  Previous medication trials: Xanax , Wellbutrin  SR, Prozac , Gabapentin    Review of Systems:  Review of Systems  Musculoskeletal:  Negative for gait problem.  Neurological:  Negative for tremors.  Psychiatric/Behavioral:         Please refer to HPI    Medications: I have reviewed the patient's current medications.  Current Outpatient Medications  Medication Sig Dispense Refill   acetaminophen  (TYLENOL ) 500 MG tablet Take 500 mg by mouth every 8 (eight) hours as needed for moderate pain.     ALPRAZolam  (XANAX ) 1 MG tablet Take 1 tablet by mouth twice daily as needed for anxiety 30 tablet 1   aspirin  81 MG tablet Take 81 mg by mouth daily.     buPROPion  (WELLBUTRIN  SR) 150 MG 12 hr tablet Take 1 tablet (150 mg total) by mouth 2 (two) times daily. 60 tablet 1   cetirizine  (ZYRTEC ) 10 MG tablet Take 1 tablet (10 mg total) by mouth daily as needed for allergies. 90 tablet 3   clotrimazole -betamethasone  (LOTRISONE ) cream Apply 1 Application topically 2 (  two) times daily. 60 g 1   ezetimibe  (ZETIA ) 10 MG tablet Take 1 tablet (10 mg total) by mouth daily. 90 tablet 3   levonorgestrel  (MIRENA ) 20 MCG/DAY IUD 1 each by Intrauterine route once.     lisinopril  (ZESTRIL ) 10 MG tablet Take 1 tablet (10 mg total) by mouth daily. 90 tablet 3   Multiple Vitamin (MULTIVITAMIN)  capsule Take 1 capsule by mouth daily.     nitroGLYCERIN  (NITROSTAT ) 0.4 MG SL tablet Place 1 tablet (0.4 mg total) under the tongue every 5 (five) minutes as needed for chest pain. 25 tablet 3   omeprazole  (PRILOSEC) 20 MG capsule Take 1 capsule (20 mg total) by mouth daily. 90 capsule 1   rosuvastatin  (CRESTOR ) 40 MG tablet Take 1 tablet (40 mg total) by mouth daily. 90 tablet 3   Semaglutide-Weight Management (WEGOVY) 0.25 MG/0.5ML SOAJ Inject 0.25 mg into the skin once a week. 2 mL 0   sertraline  (ZOLOFT ) 50 MG tablet Take 1 tablet (50 mg total) by mouth daily. 30 tablet 2   Vitamin D , Ergocalciferol , (DRISDOL ) 1.25 MG (50000 UNIT) CAPS capsule Take 1 capsule (50,000 Units total) by mouth every 7 (seven) days. 5 capsule 0   No current facility-administered medications for this visit.    Medication Side Effects: None  Allergies:  Allergies  Allergen Reactions   Wound Dressing Adhesive Dermatitis and Rash    Past Medical History:  Diagnosis Date   Anxiety    Back pain    CAD (coronary artery disease) 07/2011   cardiologist--- dr lavona--   a. 07/2011 NSTEMI/Cath: RCA 99%m, otw nl cors, EF 50%, inf hk -> RCA stented with 3.0x89mm Promus DES   Depression    Fatty liver    GERD (gastroesophageal reflux disease)    Heart disease    History of non-ST elevation myocardial infarction (NSTEMI) 07/2011   s/p PCI and stenting   History of sepsis 04/2021   hospital admission---  sepsis due to infected mesh post op diaphramatic hernia repair,  intrathoracic abscess w/ pleural effusion   Hyperlipidemia    Hypertension    Morgagni hernia    followed by dr h. littlefoot (cardiac / thoracic surgeon)  lov note in epic 10-17-2021 pt released prn//    a. Discovered incidentally on CT 07/2011  (diaphragmatic hernia);    02-24-2021  s/p repair with mesh;  hernia recurrence 04-04-2021/  02/ 2023 infected mesh w/ sepsis,  s/p unroof abscess/ pleural irrigation system placed/   re-do hernia repair  06-02-2021   Myocardial infarction [facility])    2013   Obesity    Obesity    S/P drug eluting coronary stent placement 08/10/2011   DES x1  to  mRCA   Sinus bradycardia    a. Nocturnal, asymptomatic   Sun allergy    per pt dx via testing    Family History  Problem Relation Age of Onset   Diabetes Mother    Coronary artery disease Mother 54        stent   Heart attack Mother    Heart disease Mother    Hyperlipidemia Mother    Obesity Mother    Colon polyps Mother    Hypertension Father    Arrhythmia Father    Depression Father    Coronary artery disease Maternal Aunt 36       (Mother's twin sister)   Lung cancer Maternal Aunt    Heart attack Maternal Aunt  mother's twin   Colon polyps Maternal Aunt    Colon cancer Maternal Uncle    Coronary artery disease Maternal Uncle 46   Heart attack Maternal Uncle    Breast cancer Maternal Grandmother    Breast cancer Cousin 10   Cancer Neg Hx    Early death Neg Hx    Kidney disease Neg Hx    Stroke Neg Hx    Alcohol abuse Neg Hx    Drug abuse Neg Hx     Social History   Socioeconomic History   Marital status: Significant Other    Spouse name: Not on file   Number of children: 0   Years of education: Not on file   Highest education level: Not on file  Occupational History   Occupation: Realtor  Tobacco Use   Smoking status: Former    Current packs/day: 0.00    Average packs/day: 1 pack/day for 18.0 years (18.0 ttl pk-yrs)    Types: Cigarettes    Start date: 08/08/1993    Quit date: 08/09/2011    Years since quitting: 12.1   Smokeless tobacco: Never   Tobacco comments:    2013  Vaping Use   Vaping status: Never Used  Substance and Sexual Activity   Alcohol use: Yes    Comment: occasionally   Drug use: Never   Sexual activity: Yes    Birth control/protection: I.U.D.  Other Topics Concern   Not on file  Social History Narrative   Lives alone.  Occasional EtOH.  Works as a Veterinary surgeon.   Social Drivers of  Health   Financial Resource Strain: Patient Declined (09/20/2023)   Overall Financial Resource Strain (CARDIA)    Difficulty of Paying Living Expenses: Patient declined  Food Insecurity: Patient Declined (09/20/2023)   Hunger Vital Sign    Worried About Running Out of Food in the Last Year: Patient declined    Ran Out of Food in the Last Year: Patient declined  Transportation Needs: Patient Declined (09/20/2023)   PRAPARE - Administrator, Civil Service (Medical): Patient declined    Lack of Transportation (Non-Medical): Patient declined  Physical Activity: Insufficiently Active (09/20/2023)   Exercise Vital Sign    Days of Exercise per Week: 1 day    Minutes of Exercise per Session: 30 min  Stress: No Stress Concern Present (09/20/2023)   [employer] of Occupational Health - Occupational Stress Questionnaire    Feeling of Stress: Not at all  Social Connections: Unknown (09/20/2023)   Social Connection and Isolation Panel    Frequency of Communication with Friends and Family: Patient declined    Frequency of Social Gatherings with Friends and Family: Patient declined    Attends Religious Services: Patient declined    Database administrator or Organizations: Patient declined    Attends Banker Meetings: Not on file    Marital Status: Patient declined  Recent Concern: Social Connections - Socially Isolated (07/30/2023)   Social Connection and Isolation Panel    Frequency of Communication with Friends and Family: Three times a week    Frequency of Social Gatherings with Friends and Family: Three times a week    Attends Religious Services: Never    Active Member of Clubs or Organizations: No    Attends Banker Meetings: Never    Marital Status: Separated  Intimate Partner Violence: Patient Declined (07/30/2023)   Humiliation, Afraid, Rape, and Kick questionnaire    Fear of Current or Ex-Partner: Patient declined  Emotionally Abused: Patient  declined    Physically Abused: Patient declined    Sexually Abused: Patient declined    Past Medical History, Surgical history, Social history, and Family history were reviewed and updated as appropriate.   Please see review of systems for further details on the patient's review from today.   Objective:   Physical Exam:  There were no vitals taken for this visit.  Physical Exam Constitutional:      General: She is not in acute distress.  Musculoskeletal:        General: No deformity.   Neurological:     Mental Status: She is alert and oriented to person, place, and time.     Coordination: Coordination normal.   Psychiatric:        Attention and Perception: Attention and perception normal. She does not perceive auditory or visual hallucinations.        Mood and Affect: Mood normal. Mood is not anxious or depressed. Affect is not labile, blunt, angry or inappropriate.        Speech: Speech normal.        Behavior: Behavior normal.        Thought Content: Thought content normal. Thought content is not paranoid or delusional. Thought content does not include homicidal or suicidal ideation. Thought content does not include homicidal or suicidal plan.        Cognition and Memory: Cognition and memory normal.        Judgment: Judgment normal.     Comments: Insight intact     Lab Review:     Component Value Date/Time   NA 138 09/17/2023 0914   NA 141 01/18/2023 1045   K 4.7 09/17/2023 0914   CL 102 09/17/2023 0914   CO2 24 09/17/2023 0914   GLUCOSE 126 (H) 09/17/2023 0914   BUN 10 09/17/2023 0914   BUN 9 01/18/2023 1045   CREATININE 0.88 09/17/2023 0914   CALCIUM  9.4 09/17/2023 0914   PROT 7.0 09/17/2023 0914   PROT 6.9 01/18/2023 1045   ALBUMIN  4.2 01/18/2023 1045   AST 44 (H) 09/17/2023 0914   ALT 38 (H) 09/17/2023 0914   ALKPHOS 96 01/18/2023 1045   BILITOT 0.5 09/17/2023 0914   BILITOT 0.5 01/18/2023 1045   GFRNONAA >60 06/04/2021 0502   GFRNONAA 97 07/30/2020  0944   GFRAA 113 07/30/2020 0944       Component Value Date/Time   WBC 7.9 09/17/2023 0914   RBC 5.27 (H) 09/17/2023 0914   HGB 16.0 (H) 09/17/2023 0914   HGB 15.6 01/18/2023 1045   HCT 48.7 (H) 09/17/2023 0914   HCT 47.5 (H) 01/18/2023 1045   PLT 288 09/17/2023 0914   PLT 321 01/18/2023 1045   MCV 92.4 09/17/2023 0914   MCV 94 01/18/2023 1045   MCH 30.4 09/17/2023 0914   MCHC 32.9 09/17/2023 0914   RDW 12.8 09/17/2023 0914   RDW 13.0 01/18/2023 1045   LYMPHSABS 3.3 (H) 01/18/2023 1045   MONOABS 0.8 05/29/2021 0540   EOSABS 142 09/17/2023 0914   EOSABS 0.1 01/18/2023 1045   BASOSABS 63 09/17/2023 0914   BASOSABS 0.1 01/18/2023 1045    No results found for: POCLITH, LITHIUM   No results found for: PHENYTOIN, PHENOBARB, VALPROATE, CBMZ   .res Assessment: Plan:    Treatment Plan/Recommendations:   Plan:  PDMP reviewed   Wellbutrin  SR 150mg  daily Xanax  1mg  BID - taking as needed Zoloft  50mg  daily  RTC 4 weeks  20 minutes spent dedicated  to the care of this patient on the date of this encounter to include pre-visit review of records, ordering of medication, post visit documentation, and face-to-face time with the patient discussing depression and anxiety. Discussed continuing current medication regimen.  Patient advised to contact office with any questions, adverse effects, or acute worsening in signs and symptoms.   Diagnoses and all orders for this visit:  Moderate episode of recurrent major depressive disorder (HCC)  Generalized anxiety disorder     Please see After Visit Summary for patient specific instructions.  Future Appointments  Date Time Provider Department Center  09/21/2023 10:00 AM Baxley, Ronal PARAS, MD [facility]  09/22/2023  4:00 PM [facility] Ronal Caldron, PhD [facility] None  10/04/2023  3:00 PM [facility] Ronal Caldron, PhD [facility] None  10/11/2023  3:00 PM [facility] Ronal Caldron, PhD [facility] None    No orders of the defined types were placed  in this encounter.     -------------------------------

## 2023-09-20 NOTE — Addendum Note (Signed)
 Addended by: DARRELL BRUCKNER on: 09/20/2023 07:53 AM   Modules accepted: Orders

## 2023-09-21 ENCOUNTER — Ambulatory Visit (INDEPENDENT_AMBULATORY_CARE_PROVIDER_SITE_OTHER): Payer: 59 | Admitting: Internal Medicine

## 2023-09-21 VITALS — BP 126/82 | HR 109 | Temp 98.0°F | Ht 64.5 in | Wt 357.8 lb

## 2023-09-21 DIAGNOSIS — Z87891 Personal history of nicotine dependence: Secondary | ICD-10-CM

## 2023-09-21 DIAGNOSIS — E119 Type 2 diabetes mellitus without complications: Secondary | ICD-10-CM

## 2023-09-21 DIAGNOSIS — R600 Localized edema: Secondary | ICD-10-CM

## 2023-09-21 DIAGNOSIS — I10 Essential (primary) hypertension: Secondary | ICD-10-CM | POA: Diagnosis not present

## 2023-09-21 DIAGNOSIS — E559 Vitamin D deficiency, unspecified: Secondary | ICD-10-CM

## 2023-09-21 DIAGNOSIS — G4733 Obstructive sleep apnea (adult) (pediatric): Secondary | ICD-10-CM | POA: Diagnosis not present

## 2023-09-21 DIAGNOSIS — Z6841 Body Mass Index (BMI) 40.0 and over, adult: Secondary | ICD-10-CM

## 2023-09-21 DIAGNOSIS — Z0000 Encounter for general adult medical examination without abnormal findings: Secondary | ICD-10-CM | POA: Diagnosis not present

## 2023-09-21 DIAGNOSIS — E785 Hyperlipidemia, unspecified: Secondary | ICD-10-CM | POA: Diagnosis not present

## 2023-09-21 DIAGNOSIS — F5101 Primary insomnia: Secondary | ICD-10-CM

## 2023-09-21 DIAGNOSIS — F411 Generalized anxiety disorder: Secondary | ICD-10-CM

## 2023-09-21 DIAGNOSIS — E669 Obesity, unspecified: Secondary | ICD-10-CM

## 2023-09-21 DIAGNOSIS — K219 Gastro-esophageal reflux disease without esophagitis: Secondary | ICD-10-CM

## 2023-09-21 DIAGNOSIS — I252 Old myocardial infarction: Secondary | ICD-10-CM

## 2023-09-21 DIAGNOSIS — I2510 Atherosclerotic heart disease of native coronary artery without angina pectoris: Secondary | ICD-10-CM

## 2023-09-21 DIAGNOSIS — Z780 Asymptomatic menopausal state: Secondary | ICD-10-CM

## 2023-09-21 DIAGNOSIS — F418 Other specified anxiety disorders: Secondary | ICD-10-CM | POA: Diagnosis not present

## 2023-09-21 DIAGNOSIS — F32A Depression, unspecified: Secondary | ICD-10-CM

## 2023-09-21 DIAGNOSIS — Z23 Encounter for immunization: Secondary | ICD-10-CM

## 2023-09-21 LAB — POC URINALSYSI DIPSTICK (AUTOMATED)
Bilirubin, UA: NEGATIVE
Blood, UA: NEGATIVE
Glucose, UA: NEGATIVE
Ketones, UA: NEGATIVE
Leukocytes, UA: NEGATIVE
Nitrite, UA: NEGATIVE
Protein, UA: NEGATIVE
Spec Grav, UA: 1.015 (ref 1.010–1.025)
Urobilinogen, UA: 0.2 U/dL
pH, UA: 6 (ref 5.0–8.0)

## 2023-09-21 MED ORDER — BUPROPION HCL ER (SR) 150 MG PO TB12: 150.0000 mg | ORAL_TABLET | Freq: Two times a day (BID) | ORAL | 1 refills | Status: DC

## 2023-09-21 NOTE — Progress Notes (Signed)
 Annual Wellness Visit   Patient Care Team: Ebb Carelock, Ronal PARAS, MD as PCP - General (Internal Medicine) Lavona Agent, MD as PCP - Cardiology (Cardiology) Shyrl Linnie KIDD, MD as Consulting Physician (Cardiothoracic Surgery)  Visit Date: 09/22/23   Chief Complaint  Patient presents with   Annual Exam   Subjective:  Patient: Rhonda Colon, Female DOB: May 29, 1975, 48 y.o. MRN: [id number]  Rhonda Colon is a 48 y.o. Female who presents today for her Annual Wellness Visit. Patient has CAD (coronary artery disease); Urticaria, solar; Depression with anxiety; Other fatigue; SOBOE (shortness of breath on exertion); Essential hypertension; Dyslipidemia; Chronic low back pain; Vulvovaginitis; Diaphragmatic hernia; Sepsis due to intrathoracic abscess (HCC); Pleural effusion; Empyema lung (HCC); Depression screening; Infected prosthetic mesh of abdominal wall (HCC); Antibiotic-associated diarrhea; Pain in joint of right shoulder; Obesity, Beginnging BMI 58.7; BMI 50.0-59.9, adult (HCC); Hyperglycemia; Vitamin D  deficiency; Prediabetes; Menopause; and Sleep apnea on their problem list.  Former Smoker, quit following an MI in 2013; Hypertension treated with Lisinopril  10 mg daily and Nitroglycerin  as needed for chest pain. Blood pressure normal today at 126/82. Followed by Dr. Lavona, Cardiologist.   History of Hyperlipidemia treated with Rosuvastatin  40 mg daily and Zetia  10 mg daily. 09/17/2023 Lipid Panel HDL 48, decreased from 52 in 6.2024; otherwise WNL. LPA 222.8  History of Diabetes Mellitus, type II with 09/17/2023 HgbA1c 6.9, elevated from 6.2; Glucose 126, elevated from 114.   History of Obesity; BMI 50+, today weighs 357 lbs 12.8 ozBMI 60.47. Weight Loss managed on Wegovy  0.25 mg weekly. Recently started.  History of Anxiety/Depression treated with Xanax  1 mg as needed, Wellbutrin -SR 150 mg twice daily, and Zoloft  50 mg daily. Now followed by Dr. Jackson.   History of Daytime  Somnolence diagnosed with Severe OSA per sleep study on 08/20/2023 and is now managed with a CPAP nightly.   History of GERD treated with Prilosec 20 mg daily.   Morgagni Hernia discovered incidentally in 07/2011; S/p Repair w/ Mesh 01/2021, Hernia Recurrence 03/2021; 04/2021 Infected Mesh w/ Sepsis S/p Unroof Abscess/Pleural Irrigation System placed & Re-do Hernia Repair in 05/2021.  Labs 620/2025 CBC, compared to 08/2022: RBC 5.27, elevated from 4.93; Hemoglobin 16, elevated from 15.2; HCT 48.7, elevated from 44.9; otherwise WNL.  CMP, compared to 08/2022: Glucose 126, elevated from 114; AST 44, elevated from 38; ALT 38, no change; otherwise WNL.   S/p D&C/Hysteroscopy, Operative US ; and Mirena  IUD inserted 2023; PAP Smear 08/09/2020 normal. Followed by OBGYN.  Mammogram 01/19/2023  normal with repeat recommendation of 2025.  Colonoscopy 02/26/2022 removed 4 polyps total - 3 sessile 3-5 mm from descending and transverse colon, 1 pedunculated 10 mm from descending colon (found to be hyperplastic polyps per pathology); visualized Multiple Diverticula in sigmoid and descending colon; Non-bleeding Internal Hemorrhoids with repeat recommendation of 2028.    Vaccine Counseling: Due for Shingles 1/2, PNA, and Tdap; UTD on Flu Past Medical History:  Diagnosis Date   Anxiety    Back pain    CAD (coronary artery disease) 07/2011   cardiologist--- dr lavona--   a. 07/2011 NSTEMI/Cath: RCA 99%m, otw nl cors, EF 50%, inf hk -> RCA stented with 3.0x47mm Promus DES   Depression    Fatty liver    GERD (gastroesophageal reflux disease)    Heart disease    History of non-ST elevation myocardial infarction (NSTEMI) 07/2011   s/p PCI and stenting   History of sepsis 04/2021   hospital admission---  sepsis due to infected mesh post  op diaphramatic hernia repair,  intrathoracic abscess w/ pleural effusion   Hyperlipidemia    Hypertension    Morgagni hernia    followed by dr h. littlefoot (cardiac / thoracic  surgeon)  lov note in epic 10-17-2021 pt released prn//    a. Discovered incidentally on CT 07/2011  (diaphragmatic hernia);    02-24-2021  s/p repair with mesh;  hernia recurrence 04-04-2021/  02/ 2023 infected mesh w/ sepsis,  s/p unroof abscess/ pleural irrigation system placed/   re-do hernia repair 06-02-2021   Myocardial infarction [facility])    2013   Obesity    Obesity    S/P drug eluting coronary stent placement 08/10/2011   DES x1  to  mRCA   Sinus bradycardia    a. Nocturnal, asymptomatic   Sun allergy    per pt dx via testing   Medical/Surgical History Narrative:   Allergic/Intolerant to: Wound Dressing Adhesive - dermatitis and rash  2002 - Fracture Wrist  1992 - Fractured Spine  1988 - Fractured Foot  Past Surgical History:  Procedure Laterality Date   CARDIAC CATHETERIZATION  08/10/2011   left heart, with angiogram; DES mid RCA   COLONOSCOPY WITH PROPOFOL  N/A 02/26/2022   Procedure: COLONOSCOPY WITH PROPOFOL ;  Surgeon: Federico Rosario BROCKS, MD;  Location: [facility];  Service: Gastroenterology;  Laterality: N/A;   DIAPHRAGMATIC HERNIA REPAIR  06/02/2021   with mesh removal due to sepsis   DILATATION & CURETTAGE/HYSTEROSCOPY WITH MYOSURE N/A 11/24/2021   Procedure: DILATATION & CURETTAGE/HYSTEROSCOPY;  Surgeon: Jannis Kate Norris, MD;  Location: [facility];  Service: Gynecology;  Laterality: N/A;   HEMOSTASIS CLIP PLACEMENT  02/26/2022   Procedure: HEMOSTASIS CLIP PLACEMENT;  Surgeon: Federico Rosario BROCKS, MD;  Location: [facility];  Service: Gastroenterology;;   INTERCOSTAL NERVE BLOCK Right 05/22/2021   Procedure: INTERCOSTAL NERVE BLOCK;  Surgeon: Shyrl Linnie KIDD, MD;  Location: [facility];  Service: Thoracic;  Laterality: Right;   INTRAUTERINE DEVICE (IUD) INSERTION N/A 11/24/2021   Procedure: Mirena  INTRAUTERINE DEVICE (IUD) INSERTION;  Surgeon: Jannis Kate Norris, MD;  Location: [facility];  Service: Gynecology;  Laterality: N/A;   IR  RADIOLOGIST EVAL & MGMT  05/05/2021   LEFT HEART CATHETERIZATION WITH CORONARY ANGIOGRAM N/A 08/10/2011   Procedure: LEFT HEART CATHETERIZATION WITH CORONARY ANGIOGRAM;  Surgeon: Lonni JONETTA Cash, MD;  Location: [facility] C[facility] LAB;  Service: Cardiovascular;  Laterality: N/A;   OPERATIVE ULTRASOUND N/A 11/24/2021   Procedure: OPERATIVE ULTRASOUND;  Surgeon: Jannis Kate Norris, MD;  Location: [facility];  Service: Gynecology;  Laterality: N/A;   POLYPECTOMY  02/26/2022   Procedure: POLYPECTOMY;  Surgeon: Federico Rosario BROCKS, MD;  Location: [facility];  Service: Gastroenterology;;   TONSILLECTOMY     child   XI ROBOTIC ASSISTED REPAIR OF DIAPHRAGMATIC HERNIA  02/24/2021   @[facility]   by dr palmer;   w/ mesh   Family History  Problem Relation Age of Onset   Diabetes Mother    Coronary artery disease Mother 36        stent   Heart attack Mother    Heart disease Mother    Hyperlipidemia Mother    Obesity Mother    Colon polyps Mother    Hypertension Father    Arrhythmia Father    Depression Father    Coronary artery disease Maternal Aunt 27       (Mother's twin sister)   Lung cancer Maternal Aunt    Heart attack Maternal Aunt  mother's twin   Colon polyps Maternal Aunt    Colon cancer Maternal Uncle    Coronary artery disease Maternal Uncle 11-02-2044   Heart attack Maternal Uncle    Breast cancer Maternal Grandmother    Breast cancer Cousin 18   Cancer Neg Hx    Early death Neg Hx    Kidney disease Neg Hx    Stroke Neg Hx    Alcohol abuse Neg Hx    Drug abuse Neg Hx    Family History Narrative: No Family History of Early Death, Kidney Disease, Stroke, Alcohol Abuse, or Drug Abuse Great-Grandfather w/ hx of Colon Cancer Father w/ hx of Arrhythmia, Depression, Hypertension Maternal Grandmother w/ hx of Breast Cancer,  Maternal Aunt, deceased in November 02, 2017, w/ hx of Colon polyps, Coronary Artery Disease (Onset age 72), Heart Attack, and Lung Cancer  Maternal Uncle w/ hx of  Colon Cancer, Coronary Artery Disease (onset age 61), and Heart Attack  Mother w/ hx of Coronary Artery Disease (onset age 66), Heart Disease, Colon Polyps, Diabetes, Heart Attack, Hyperlipidemia, and Obesity  Cousin w/ hx of Breast Cancer (onset age 30) Social History   Social History Narrative   Lives alone.  Occasional EtOH.  Works as a Veterinary surgeon.  Review of Systems  Constitutional:  Negative for chills, fever, malaise/fatigue and weight loss.  HENT:  Negative for hearing loss, sinus pain and sore throat.   Respiratory:  Negative for cough, hemoptysis and shortness of breath.   Cardiovascular:  Negative for chest pain, palpitations, leg swelling and PND.  Gastrointestinal:  Negative for abdominal pain, constipation, diarrhea, heartburn, nausea and vomiting.  Genitourinary:  Negative for dysuria, frequency and urgency.  Musculoskeletal:  Negative for back pain, myalgias and neck pain.  Skin:  Negative for itching and rash.  Neurological:  Negative for dizziness, tingling, seizures and headaches.  Endo/Heme/Allergies:  Negative for polydipsia.  Psychiatric/Behavioral:  Negative for depression. The patient is not nervous/anxious.     Objective:  Vitals: BP 126/82   Pulse (!) 109   Temp 98 F (36.7 C) (Temporal)   Ht 5' 4.5 (1.638 m)   Wt (!) 357 lb 12.8 oz (162.3 kg)   SpO2 93%   BMI 60.47 kg/m  Physical Exam Vitals and nursing note reviewed.  Constitutional:      General: She is not in acute distress.    Appearance: Normal appearance. She is not ill-appearing or toxic-appearing.  HENT:     Head: Normocephalic and atraumatic.     Right Ear: Hearing, tympanic membrane, ear canal and external ear normal.     Left Ear: Hearing, tympanic membrane, ear canal and external ear normal.     Mouth/Throat:     Pharynx: Oropharynx is clear.   Eyes:     Extraocular Movements: Extraocular movements intact.     Pupils: Pupils are equal, round, and reactive to light.   Neck:      Thyroid : No thyroid  mass, thyromegaly or thyroid  tenderness.     Vascular: No carotid bruit.   Cardiovascular:     Rate and Rhythm: Normal rate and regular rhythm. No extrasystoles are present.    Pulses:          Dorsalis pedis pulses are 2+ on the right side and 2+ on the left side.     Heart sounds: Normal heart sounds. No murmur heard.    No friction rub. No gallop.  Pulmonary:     Effort: Pulmonary effort is normal.     Breath  sounds: Normal breath sounds. No decreased breath sounds, wheezing, rhonchi or rales.  Chest:     Chest wall: No mass.  Abdominal:     Palpations: Abdomen is soft. There is no hepatomegaly, splenomegaly or mass.     Tenderness: There is no abdominal tenderness.     Hernia: No hernia is present.   Musculoskeletal:     Cervical back: Normal range of motion.     Right lower leg: Edema (trace) present.     Left lower leg: Edema (trace) present.  Lymphadenopathy:     Cervical: No cervical adenopathy.     Upper Body:     Right upper body: No supraclavicular adenopathy.     Left upper body: No supraclavicular adenopathy.   Skin:    General: Skin is warm and dry.   Neurological:     General: No focal deficit present.     Mental Status: She is alert and oriented to person, place, and time. Mental status is at baseline.     Sensory: Sensation is intact.     Motor: Motor function is intact. No weakness.     Deep Tendon Reflexes: Reflexes are normal and symmetric.   Psychiatric:        Attention and Perception: Attention normal.        Mood and Affect: Mood normal.        Speech: Speech normal.        Behavior: Behavior normal.        Thought Content: Thought content normal.        Cognition and Memory: Cognition normal.        Judgment: Judgment normal.   Most Recent Fall Risk Assessment:    09/21/2023   10:27 AM  Fall Risk   Falls in the past year? 0  Number falls in past yr: 0  Injury with Fall? 0  Risk for fall due to : No Fall Risks  Follow  up Falls evaluation completed   Most Recent Depression Screenings:    09/21/2023   10:28 AM 07/30/2023    2:36 PM  PHQ 2/9 Scores  PHQ - 2 Score 0 1   Results:  Studies Obtained And Personally Reviewed By Me:  Sleep Study 08/15/2023 Loud snoring noted with events & arousals prior to Split/CPAP 1 restroom visit No PLMs/PLMAs noted On EKG, sinus rhythm with rare arrhythmias noted No obvious parasomnias observed REM delayed, not achieved until CPAP portion  PAP Smear 08/09/2020 normal.   Mammogram 01/19/2023  normal.  Colonoscopy 02/26/2022 removed 4 polyps total - 3 sessile 3-5 mm from descending and transverse colon, 1 pedunculated 10 mm from descending colon; visualized Multiple Diverticula in sigmoid and descending colon; Non-bleeding Internal Hemorrhoids.   Labs:     Component Value Date/Time   NA 138 09/17/2023 0914   NA 141 01/18/2023 1045   K 4.7 09/17/2023 0914   CL 102 09/17/2023 0914   CO2 24 09/17/2023 0914   GLUCOSE 126 (H) 09/17/2023 0914   BUN 10 09/17/2023 0914   BUN 9 01/18/2023 1045   CREATININE 0.88 09/17/2023 0914   CALCIUM  9.4 09/17/2023 0914   PROT 7.0 09/17/2023 0914   PROT 6.9 01/18/2023 1045   ALBUMIN  4.2 01/18/2023 1045   AST 44 (H) 09/17/2023 0914   ALT 38 (H) 09/17/2023 0914   ALKPHOS 96 01/18/2023 1045   BILITOT 0.5 09/17/2023 0914   BILITOT 0.5 01/18/2023 1045   GFRNONAA >60 06/04/2021 0502   GFRNONAA 97 07/30/2020 0944  GFRAA 113 07/30/2020 0944    Lab Results  Component Value Date   WBC 7.9 09/17/2023   HGB 16.0 (H) 09/17/2023   HCT 48.7 (H) 09/17/2023   MCV 92.4 09/17/2023   PLT 288 09/17/2023   Lab Results  Component Value Date   CHOL 146 09/17/2023   HDL 48 (L) 09/17/2023   LDLCALC 83 09/17/2023   TRIG 74 09/17/2023   CHOLHDL 3.0 09/17/2023   Lab Results  Component Value Date   HGBA1C 6.9 (H) 09/17/2023    Lab Results  Component Value Date   TSH 1.27 09/17/2023    Assessment & Plan:   Orders Placed This  Encounter  Procedures   Pneumococcal conjugate vaccine 20-valent (Prevnar 20)   POCT Urinalysis Dipstick (Automated)   Meds ordered this encounter  Medications   buPROPion  (WELLBUTRIN  SR) 150 MG 12 hr tablet    Sig: Take 1 tablet (150 mg total) by mouth 2 (two) times daily.    Dispense:  60 tablet    Refill:  1   Other Labs Reviewed today: CBC, compared to 08/2022: RBC 5.27, elevated from 4.93; Hemoglobin 16, elevated from 15.2; HCT 48.7, elevated from 44.9; otherwise WNL.  CMP, compared to 08/2022: Glucose 126, elevated from 114; AST 44, elevated from 38; ALT 38, no change; otherwise WNL.   Former Smoker, quit following an MI in 2013.  Hypertension treated with Lisinopril  10 mg daily and Nitroglycerin  as needed for chest pain. Blood Pressure: normotensive today at 126/82. Followed by Dr. Lavona, Cardiologist.   Hyperlipidemia treated with Rosuvastatin  40 mg daily and Zetia  10 mg daily. 09/17/2023 Lipid Panel HDL 48, decreased from 52 in 6.2024; otherwise WNL. LPA 222.8  Diabetes Mellitus, type II with 09/17/2023 HgbA1c 6.9, elevated from 6.2; Glucose 126, elevated from 114.   Obesity; BMI 50+, today weighs 357 lbs 12.8 ozBMI 60.47. Weight Loss managed on Wegovy  0.25 mg weekly.  Anxiety/Depression treated with Xanax  1 mg as needed, Wellbutrin -SR 150 mg twice daily, and Zoloft  50 mg daily. Now followed by Dr. Jackson.   Diagnosed with Severe OSA per sleep study on 08/20/2023 and is now managed with a CPAP nightly.   GERD treated with Prilosec 20 mg daily.   Hx of Morgagni Hernia discovered incidentally in 07/2011; S/p Repair w/ Mesh 01/2021, Hernia Recurrence 03/2021; 04/2021 Infected Mesh w/ Sepsis S/p Unroof Abscess/Pleural Irrigation System placed & Re-do Hernia Repair in 05/2021.  S/p D&C/Hysteroscopy, Operative US ; and Mirena  IUD inserted 2023;   PAP Smear 08/09/2020 normal. Followed by OBGYN.  Mammogram 01/19/2023  normal with repeat recommendation of 2025.  Colonoscopy  02/26/2022 removed 4 polyps total - 3 sessile 3-5 mm from descending and transverse colon, 1 pedunculated 10 mm from descending colon (found to be hyperplastic polyps per pathology); visualized Multiple Diverticula in sigmoid and descending colon; Non-bleeding Internal Hemorrhoids with repeat recommendation of 2028.  Bone Density will be due 2040.   Vaccine Counseling: Due for Shingles 1/2, PNA, and Tdap; UTD on Flu  Return in 3 months (on 12/24/2023) for 6 month labs (DMII) and then on 12/28/2023 for 6 month follow-up, or as needed.   Annual wellness visit done today including the all of the following: Reviewed patient's Family Medical History Reviewed and updated list of patient's medical providers Assessment of cognitive impairment was done Assessed patient's functional ability Established a written schedule for health screening services Health Risk Assessent Completed and Reviewed  Discussed health benefits of physical activity, and encouraged her to engage in regular exercise  appropriate for her age and condition.    I,Emily Lagle,acting as a Neurosurgeon for Ronal JINNY Hailstone, MD.,have documented all relevant documentation on the behalf of Ronal JINNY Hailstone, MD,as directed by  Ronal JINNY Hailstone, MD while in the presence of Ronal JINNY Hailstone, MD.   I, Ronal JINNY Hailstone, MD, have reviewed all documentation for this visit. The documentation on 09/26/23 for the exam, diagnosis, procedures, and orders are all accurate and complete.

## 2023-09-22 ENCOUNTER — Ambulatory Visit (INDEPENDENT_AMBULATORY_CARE_PROVIDER_SITE_OTHER): Admitting: Psychology

## 2023-09-22 DIAGNOSIS — F411 Generalized anxiety disorder: Secondary | ICD-10-CM

## 2023-09-22 DIAGNOSIS — F331 Major depressive disorder, recurrent, moderate: Secondary | ICD-10-CM | POA: Diagnosis not present
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
## 2023-09-23 ENCOUNTER — Ambulatory Visit: Admitting: Obstetrics and Gynecology

## 2023-09-23 ENCOUNTER — Telehealth: Payer: Self-pay | Admitting: Adult Health

## 2023-09-23 DIAGNOSIS — G4733 Obstructive sleep apnea (adult) (pediatric): Secondary | ICD-10-CM | POA: Diagnosis not present

## 2023-09-23 NOTE — Telephone Encounter (Signed)
 Patient notified ok to increase sertraline  to 100 mg. She picked up RF of 50 mg yesterday. Told her to double and when she needed a RF to let us  know and would send in a new script for the increased dose.

## 2023-09-23 NOTE — Telephone Encounter (Signed)
 Pt seen 6/23. She said you told her she could double sertraline  from 50 mg to 100 mg. She is asking if can increase now.   Wellbutrin  SR 150mg  daily Xanax  1mg  BID - taking as needed Zoloft  50mg  daily

## 2023-09-23 NOTE — Telephone Encounter (Signed)
 Patient lvm at 11:35 stating that at her appt 6/23 it was discussed to increasing dosage of Sertraline  50mg  to 100mg . She wanted to make sure this was ok so that she can start doubling dosage. Ph: [phone] Appt 7/7

## 2023-09-24 ENCOUNTER — Other Ambulatory Visit: Payer: Self-pay

## 2023-09-24 ENCOUNTER — Encounter: Payer: Self-pay | Admitting: Cardiology

## 2023-09-24 ENCOUNTER — Telehealth: Payer: Self-pay | Admitting: Adult Health

## 2023-09-24 DIAGNOSIS — E785 Hyperlipidemia, unspecified: Secondary | ICD-10-CM

## 2023-09-24 MED ORDER — SERTRALINE HCL 100 MG PO TABS: 100.0000 mg | ORAL_TABLET | Freq: Every day | ORAL | 1 refills | Status: DC

## 2023-09-24 NOTE — Telephone Encounter (Signed)
 I had talked to patient yesterday and she reported had just picked up RF for sertraline  50 mg the day previously, so should have a few days to double medication before needing a RF. Today she reports she was mistaken, that she had picked up another medication. She is asking to send in 100 mg sertraline  and I did send.

## 2023-09-24 NOTE — Telephone Encounter (Signed)
 Pt called requesting new Rx Sertraline  100 mg. Will be out of 50 mg in 2 days. Psychiatrist. Apt 7/7

## 2023-09-24 NOTE — Progress Notes (Signed)
 Referral placed to Pharm-D  per provider's request.

## 2023-09-26 ENCOUNTER — Encounter: Payer: Self-pay | Admitting: Internal Medicine

## 2023-09-26 NOTE — Patient Instructions (Addendum)
 We are glad to hear that Dr. Jackson has been helpful with counseling. Please call [facility] for a bone density appt. Please try to find a cool place to walk some this summer. It is important to exercise daily. Please monitor blood pressure and  return in 3 months for Hgb AIC and office visit.

## 2023-09-27 NOTE — Telephone Encounter (Signed)
 Spoke with pt regarding her results and Dr. Denver suggestions. Referral place to Pharm D lipid clinic. Pt aware. Pt verbalized understanding. All questions if any were answered.

## 2023-09-27 NOTE — Telephone Encounter (Signed)
-----   Message from Lynwood Schilling sent at 09/23/2023 10:11 PM EDT ----- LPa is elevated.  LDL was mildly above target.  I would like to change her to PCSK9 inhibitor.  Please set up a visit with the Pharm D Lipid clinic.  Call Ms. Kohles with the results and send  results to Perri Ronal PARAS, MD ----- Message ----- From: Interface, [facility] Lab Results In Sent: 09/20/2023   5:35 AM EDT To: Lynwood Schilling, MD

## 2023-10-04 ENCOUNTER — Telehealth: Admitting: Adult Health

## 2023-10-04 ENCOUNTER — Encounter: Payer: Self-pay | Admitting: Adult Health

## 2023-10-04 ENCOUNTER — Ambulatory Visit (INDEPENDENT_AMBULATORY_CARE_PROVIDER_SITE_OTHER): Admitting: Psychology

## 2023-10-04 DIAGNOSIS — F411 Generalized anxiety disorder: Secondary | ICD-10-CM

## 2023-10-04 DIAGNOSIS — F331 Major depressive disorder, recurrent, moderate: Secondary | ICD-10-CM | POA: Diagnosis not present

## 2023-10-04 NOTE — Progress Notes (Signed)
 Rhonda Colon [id number] 1975-05-10 48 y.o.  Virtual Visit via Video Note  I connected with pt @ on 10/04/23 at  5:00 PM EDT by a video enabled telemedicine application and verified that I am speaking with the correct person using two identifiers.   I discussed the limitations of evaluation and management by telemedicine and the availability of in person appointments. The patient expressed understanding and agreed to proceed.  I discussed the assessment and treatment plan with the patient. The patient was provided an opportunity to ask questions and all were answered. The patient agreed with the plan and demonstrated an understanding of the instructions.   The patient was advised to call back or seek an in-person evaluation if the symptoms worsen or if the condition fails to improve as anticipated.  I provided 20 minutes of non-face-to-face time during this encounter.  The patient was located at home.  The provider was located at [facility].   Angeline LOISE Sayers, NP   Subjective:   Patient ID:  Rhonda Colon is a 47 y.o. (DOB 1975/05/08) female.  Chief Complaint: No chief complaint on file.   HPI [name] presents for follow-up of MDD and GAD.  Describes mood today as improved. Pleasant. Reports decreased tearfulness - some over the weekend, but able to get a hold of it. Mood symptoms - reports decreased depression - not as much, but still some. Stating things are not grey and gloomy all the time. Denies irritability - maybe a little bit. Reports anxiety - not so much. Reports improved interest and motivation - a little bit. Reports ongoing situational stressors. Denies recent panic attacks. Denies worry, rumination and over thinking over the past few weeks. Denies obsessive thoughts or acts - more toned down. Reports mood has improved overall. Stating I'm trying to keep more positive and stay ahead of the negativity. Reports currently taking Wellbutrin   SR 150mg  daily, Zoloft  100mg  daily and Xanax  as needed - has not used. Feels like current medication is helpful. Reports taking medications as prescribed.  Energy levels increased - a little better. Active, does not have a regular exercise routine. Enjoys some usual interests and activities. Single. Lives alone. Spending time with family. Appetite adequate. Reports some weight loss - 8 pounds - started on Wegovy . Recent sleep study shows severe sleep apnea - adjusting to CPAP - 7 days. Averages 6 hours of broken sleep. Denies daytime napping. Reports focus and concentration difficulties - improved slightly. Completing tasks. Managing some aspects of household. Owns a Surveyor, quantity. Goes to mother 5 days a week to help her out - medical issues. Denies SI or HI.  Denies AH or VH. Denies self harm. Denies substance use.  Therapist - [facility]  Previous medication trials: Xanax , Wellbutrin  SR, Prozac , Gabapentin   Review of Systems:  Review of Systems  Musculoskeletal:  Negative for gait problem.  Neurological:  Negative for tremors.  Psychiatric/Behavioral:         Please refer to HPI    Medications: I have reviewed the patient's current medications.  Current Outpatient Medications  Medication Sig Dispense Refill   acetaminophen  (TYLENOL ) 500 MG tablet Take 500 mg by mouth every 8 (eight) hours as needed for moderate pain.     ALPRAZolam  (XANAX ) 1 MG tablet Take 1 tablet by mouth twice daily as needed for anxiety 30 tablet 1   aspirin  81 MG tablet Take 81 mg by mouth daily.     buPROPion  (WELLBUTRIN  SR) 150 MG 12 hr  tablet Take 1 tablet (150 mg total) by mouth 2 (two) times daily. 60 tablet 1   cetirizine  (ZYRTEC ) 10 MG tablet Take 1 tablet (10 mg total) by mouth daily as needed for allergies. 90 tablet 3   clotrimazole -betamethasone  (LOTRISONE ) cream Apply 1 Application topically 2 (two) times daily. 60 g 1   ezetimibe  (ZETIA ) 10 MG tablet Take 1 tablet (10 mg total) by mouth  daily. 90 tablet 3   fluconazole  (DIFLUCAN ) 150 MG tablet Take 150 mg by mouth once.     levonorgestrel  (MIRENA ) 20 MCG/DAY IUD 1 each by Intrauterine route once.     lisinopril  (ZESTRIL ) 10 MG tablet Take 1 tablet (10 mg total) by mouth daily. 90 tablet 3   Multiple Vitamin (MULTIVITAMIN) capsule Take 1 capsule by mouth daily.     nitroGLYCERIN  (NITROSTAT ) 0.4 MG SL tablet Place 1 tablet (0.4 mg total) under the tongue every 5 (five) minutes as needed for chest pain. 25 tablet 3   omeprazole  (PRILOSEC) 20 MG capsule Take 1 capsule (20 mg total) by mouth daily. 90 capsule 1   rosuvastatin  (CRESTOR ) 40 MG tablet Take 1 tablet (40 mg total) by mouth daily. 90 tablet 3   Semaglutide -Weight Management (WEGOVY ) 0.25 MG/0.5ML SOAJ Inject 0.25 mg into the skin once a week. 2 mL 0   sertraline  (ZOLOFT ) 100 MG tablet Take 1 tablet (100 mg total) by mouth daily. 30 tablet 1   Vitamin D , Ergocalciferol , (DRISDOL ) 1.25 MG (50000 UNIT) CAPS capsule Take 1 capsule (50,000 Units total) by mouth every 7 (seven) days. (Patient not taking: Reported on 09/21/2023) 5 capsule 0   No current facility-administered medications for this visit.    Medication Side Effects: None  Allergies:  Allergies  Allergen Reactions   Wound Dressing Adhesive Dermatitis and Rash    Past Medical History:  Diagnosis Date   Anxiety    Back pain    CAD (coronary artery disease) 07/2011   cardiologist--- dr lavona--   a. 07/2011 NSTEMI/Cath: RCA 99%m, otw nl cors, EF 50%, inf hk -> RCA stented with 3.0x75mm Promus DES   Depression    Fatty liver    GERD (gastroesophageal reflux disease)    Heart disease    History of non-ST elevation myocardial infarction (NSTEMI) 07/2011   s/p PCI and stenting   History of sepsis 04/2021   hospital admission---  sepsis due to infected mesh post op diaphramatic hernia repair,  intrathoracic abscess w/ pleural effusion   Hyperlipidemia    Hypertension    Morgagni hernia    followed by dr  h. littlefoot (cardiac / thoracic surgeon)  lov note in epic 10-17-2021 pt released prn//    a. Discovered incidentally on CT 07/2011  (diaphragmatic hernia);    02-24-2021  s/p repair with mesh;  hernia recurrence 04-04-2021/  02/ 2023 infected mesh w/ sepsis,  s/p unroof abscess/ pleural irrigation system placed/   re-do hernia repair 06-02-2021   Myocardial infarction [facility])    2013   Obesity    Obesity    S/P drug eluting coronary stent placement 08/10/2011   DES x1  to  mRCA   Sinus bradycardia    a. Nocturnal, asymptomatic   Sun allergy    per pt dx via testing    Family History  Problem Relation Age of Onset   Diabetes Mother    Coronary artery disease Mother 48        stent   Heart attack Mother    Heart disease  Mother    Hyperlipidemia Mother    Obesity Mother    Colon polyps Mother    Hypertension Father    Arrhythmia Father    Depression Father    Coronary artery disease Maternal Aunt 44       (Mother's twin sister)   Lung cancer Maternal Aunt    Heart attack Maternal Aunt        mother's twin   Colon polyps Maternal Aunt    Colon cancer Maternal Uncle    Coronary artery disease Maternal Uncle 46   Heart attack Maternal Uncle    Breast cancer Maternal Grandmother    Breast cancer Cousin 41   Cancer Neg Hx    Early death Neg Hx    Kidney disease Neg Hx    Stroke Neg Hx    Alcohol abuse Neg Hx    Drug abuse Neg Hx     Social History   Socioeconomic History   Marital status: Significant Other    Spouse name: Not on file   Number of children: 0   Years of education: Not on file   Highest education level: Not on file  Occupational History   Occupation: Realtor  Tobacco Use   Smoking status: Former    Current packs/day: 0.00    Average packs/day: 1 pack/day for 18.0 years (18.0 ttl pk-yrs)    Types: Cigarettes    Start date: 08/08/1993    Quit date: 08/09/2011    Years since quitting: 12.1   Smokeless tobacco: Never   Tobacco comments:    2013   Vaping Use   Vaping status: Never Used  Substance and Sexual Activity   Alcohol use: Yes    Comment: occasionally   Drug use: Never   Sexual activity: Yes    Birth control/protection: I.U.D.  Other Topics Concern   Not on file  Social History Narrative   Lives alone.  Occasional EtOH.  Works as a Veterinary surgeon.   Social Drivers of Health   Financial Resource Strain: Patient Declined (09/20/2023)   Overall Financial Resource Strain (CARDIA)    Difficulty of Paying Living Expenses: Patient declined  Food Insecurity: Patient Declined (09/20/2023)   Hunger Vital Sign    Worried About Running Out of Food in the Last Year: Patient declined    Ran Out of Food in the Last Year: Patient declined  Transportation Needs: Patient Declined (09/20/2023)   PRAPARE - Administrator, Civil Service (Medical): Patient declined    Lack of Transportation (Non-Medical): Patient declined  Physical Activity: Insufficiently Active (09/20/2023)   Exercise Vital Sign    Days of Exercise per Week: 1 day    Minutes of Exercise per Session: 30 min  Stress: No Stress Concern Present (09/20/2023)   [employer] of Occupational Health - Occupational Stress Questionnaire    Feeling of Stress: Not at all  Social Connections: Unknown (09/20/2023)   Social Connection and Isolation Panel    Frequency of Communication with Friends and Family: Patient declined    Frequency of Social Gatherings with Friends and Family: Patient declined    Attends Religious Services: Patient declined    Database administrator or Organizations: Patient declined    Attends Banker Meetings: Not on file    Marital Status: Patient declined  Recent Concern: Social Connections - Socially Isolated (07/30/2023)   Social Connection and Isolation Panel    Frequency of Communication with Friends and Family: Three times a week  Frequency of Social Gatherings with Friends and Family: Three times a week    Attends Religious  Services: Never    Active Member of Clubs or Organizations: No    Attends Banker Meetings: Never    Marital Status: Separated  Intimate Partner Violence: Patient Declined (07/30/2023)   Humiliation, Afraid, Rape, and Kick questionnaire    Fear of Current or Ex-Partner: Patient declined    Emotionally Abused: Patient declined    Physically Abused: Patient declined    Sexually Abused: Patient declined    Past Medical History, Surgical history, Social history, and Family history were reviewed and updated as appropriate.   Please see review of systems for further details on the patient's review from today.   Objective:   Physical Exam:  There were no vitals taken for this visit.  Physical Exam Constitutional:      General: She is not in acute distress. Musculoskeletal:        General: No deformity.  Neurological:     Mental Status: She is alert and oriented to person, place, and time.     Coordination: Coordination normal.  Psychiatric:        Attention and Perception: Attention and perception normal. She does not perceive auditory or visual hallucinations.        Mood and Affect: Mood normal. Mood is not anxious or depressed. Affect is not labile, blunt, angry or inappropriate.        Speech: Speech normal.        Behavior: Behavior normal.        Thought Content: Thought content normal. Thought content is not paranoid or delusional. Thought content does not include homicidal or suicidal ideation. Thought content does not include homicidal or suicidal plan.        Cognition and Memory: Cognition and memory normal.        Judgment: Judgment normal.     Comments: Insight intact     Lab Review:     Component Value Date/Time   NA 138 09/17/2023 0914   NA 141 01/18/2023 1045   K 4.7 09/17/2023 0914   CL 102 09/17/2023 0914   CO2 24 09/17/2023 0914   GLUCOSE 126 (H) 09/17/2023 0914   BUN 10 09/17/2023 0914   BUN 9 01/18/2023 1045   CREATININE 0.88 09/17/2023  0914   CALCIUM  9.4 09/17/2023 0914   PROT 7.0 09/17/2023 0914   PROT 6.9 01/18/2023 1045   ALBUMIN  4.2 01/18/2023 1045   AST 44 (H) 09/17/2023 0914   ALT 38 (H) 09/17/2023 0914   ALKPHOS 96 01/18/2023 1045   BILITOT 0.5 09/17/2023 0914   BILITOT 0.5 01/18/2023 1045   GFRNONAA >60 06/04/2021 0502   GFRNONAA 97 07/30/2020 0944   GFRAA 113 07/30/2020 0944       Component Value Date/Time   WBC 7.9 09/17/2023 0914   RBC 5.27 (H) 09/17/2023 0914   HGB 16.0 (H) 09/17/2023 0914   HGB 15.6 01/18/2023 1045   HCT 48.7 (H) 09/17/2023 0914   HCT 47.5 (H) 01/18/2023 1045   PLT 288 09/17/2023 0914   PLT 321 01/18/2023 1045   MCV 92.4 09/17/2023 0914   MCV 94 01/18/2023 1045   MCH 30.4 09/17/2023 0914   MCHC 32.9 09/17/2023 0914   RDW 12.8 09/17/2023 0914   RDW 13.0 01/18/2023 1045   LYMPHSABS 3.3 (H) 01/18/2023 1045   MONOABS 0.8 05/29/2021 0540   EOSABS 142 09/17/2023 0914   EOSABS 0.1 01/18/2023 1045   BASOSABS  63 09/17/2023 0914   BASOSABS 0.1 01/18/2023 1045    No results found for: POCLITH, LITHIUM   No results found for: PHENYTOIN, PHENOBARB, VALPROATE, CBMZ   .res Assessment: Plan:    Treatment Plan/Recommendations:   Plan:  PDMP reviewed   Wellbutrin  SR 150mg  daily - taking one tablet Xanax  1mg  BID - has not taken in 2 and 1/2 weeks Zoloft  100mg  daily a few weeks ago  RTC 3 weeks  20 minutes spent dedicated to the care of this patient on the date of this encounter to include pre-visit review of records, ordering of medication, post visit documentation, and face-to-face time with the patient discussing depression and anxiety. Discussed continuing current medication.   Patient advised to contact office with any questions, adverse effects, or acute worsening in signs and symptoms.  There are no diagnoses linked to this encounter.   Please see After Visit Summary for patient specific instructions.  Future Appointments  Date Time Provider Department  Center  10/04/2023  5:00 PM Fredda Clarida, Angeline Mattocks, NP [facility] None  10/11/2023  3:00 PM [facility] Ronal Caldron, PhD [facility] None  12/24/2023  9:10 AM [facility]  12/28/2023  9:30 AM Baxley, Ronal PARAS, MD [facility]    No orders of the defined types were placed in this encounter.     -------------------------------
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
## 2023-10-11 ENCOUNTER — Ambulatory Visit: Admitting: Psychology

## 2023-10-12 ENCOUNTER — Other Ambulatory Visit: Payer: Self-pay | Admitting: Cardiology

## 2023-10-12 DIAGNOSIS — Z6841 Body Mass Index (BMI) 40.0 and over, adult: Secondary | ICD-10-CM

## 2023-10-12 DIAGNOSIS — I2510 Atherosclerotic heart disease of native coronary artery without angina pectoris: Secondary | ICD-10-CM

## 2023-10-12 MED ORDER — WEGOVY 0.5 MG/0.5ML ~~LOC~~ SOAJ
0.5000 mg | SUBCUTANEOUS | 0 refills | Status: DC
Start: 2023-10-12 — End: 2023-10-12

## 2023-10-12 NOTE — Addendum Note (Signed)
 Addended by: DARRELL BRUCKNER on: 10/12/2023 07:32 AM   Modules accepted: Orders

## 2023-10-16 ENCOUNTER — Other Ambulatory Visit: Payer: Self-pay | Admitting: Internal Medicine

## 2023-10-19 ENCOUNTER — Ambulatory Visit (INDEPENDENT_AMBULATORY_CARE_PROVIDER_SITE_OTHER): Admitting: Psychology

## 2023-10-19 DIAGNOSIS — F411 Generalized anxiety disorder: Secondary | ICD-10-CM

## 2023-10-19 DIAGNOSIS — F331 Major depressive disorder, recurrent, moderate: Secondary | ICD-10-CM
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
## 2023-10-20 ENCOUNTER — Ambulatory Visit: Admitting: Psychology

## 2023-10-23 DIAGNOSIS — G4733 Obstructive sleep apnea (adult) (pediatric): Secondary | ICD-10-CM | POA: Diagnosis not present

## 2023-10-26 ENCOUNTER — Encounter: Payer: Self-pay | Admitting: Adult Health

## 2023-10-26 ENCOUNTER — Ambulatory Visit (INDEPENDENT_AMBULATORY_CARE_PROVIDER_SITE_OTHER): Admitting: Psychology

## 2023-10-26 ENCOUNTER — Telehealth (INDEPENDENT_AMBULATORY_CARE_PROVIDER_SITE_OTHER): Admitting: Adult Health

## 2023-10-26 DIAGNOSIS — F418 Other specified anxiety disorders: Secondary | ICD-10-CM

## 2023-10-26 DIAGNOSIS — F331 Major depressive disorder, recurrent, moderate: Secondary | ICD-10-CM

## 2023-10-26 DIAGNOSIS — F411 Generalized anxiety disorder: Secondary | ICD-10-CM | POA: Diagnosis not present

## 2023-10-26 MED ORDER — SERTRALINE HCL 100 MG PO TABS: 100.0000 mg | ORAL_TABLET | Freq: Every day | ORAL | 1 refills | Status: DC

## 2023-10-26 NOTE — Progress Notes (Signed)
 Rhonda Colon [id number] 27-Feb-1976 48 y.o.  Virtual Visit via Video Note  I connected with pt @ on 10/26/23 at  4:00 PM EDT by a video enabled telemedicine application and verified that I am speaking with the correct person using two identifiers.   I discussed the limitations of evaluation and management by telemedicine and the availability of in person appointments. The patient expressed understanding and agreed to proceed.  I discussed the assessment and treatment plan with the patient. The patient was provided an opportunity to ask questions and all were answered. The patient agreed with the plan and demonstrated an understanding of the instructions.   The patient was advised to call back or seek an in-person evaluation if the symptoms worsen or if the condition fails to improve as anticipated.  I provided 20 minutes of non-face-to-face time during this encounter.  The patient was located at home.  The provider was located at [facility].   Angeline LOISE Sayers, NP   Subjective:   Patient ID:  Rhonda Colon is a 48 y.o. (DOB [birth year]) female.  Chief Complaint: No chief complaint on file.   HPI Rhonda Colon presents for follow-up of MDD and GAD.  Describes mood today as ok. Pleasant. Reports decreased tearfulness. Mood symptoms - reports decreased depression - not as much, but still some. Denies irritability - still there, but a lot better. Reports anxiety - kinda low. Reports improved interest and motivation - accomplishing things. Reports ongoing situational stressors. Denies recent panic attacks. Denies worry and rumination. Denies over thinking. Denies obsessive thoughts or acts. Reports mood is stable. Stating I feel like I'm doing better. Feels like current medication is helpful. Reports taking medications as prescribed.  Energy levels increased - better. Active, does not have a regular exercise routine. Enjoys some usual interests and activities.  Single. Lives alone. Spending time with family. Appetite adequate. Reports some weight loss - 5 to 10 pounds - Wegovy . Recent sleep has improved - adjusting to CPAP. Averages 7 hours of   sleep. Denies daytime napping. Reports focus and concentration difficulties - getting better. Completing tasks. Managing some aspects of household. Owns a Surveyor, quantity. Goes to mother 5 days a week to help her out - medical issues. Denies SI or HI.  Denies AH or VH. Denies self harm. Denies substance use.  Therapist - [facility]  Previous medication trials: Xanax , Wellbutrin  SR, Prozac , Gabapentin    Review of Systems:  Review of Systems  Musculoskeletal:  Negative for gait problem.  Neurological:  Negative for tremors.  Psychiatric/Behavioral:         Please refer to HPI    Medications: I have reviewed the patient's current medications.  Current Outpatient Medications  Medication Sig Dispense Refill   acetaminophen  (TYLENOL ) 500 MG tablet Take 500 mg by mouth every 8 (eight) hours as needed for moderate pain.     ALPRAZolam  (XANAX ) 1 MG tablet Take 1 tablet by mouth twice daily as needed for anxiety 30 tablet 1   aspirin  81 MG tablet Take 81 mg by mouth daily.     buPROPion  (WELLBUTRIN  SR) 150 MG 12 hr tablet Take 1 tablet (150 mg total) by mouth 2 (two) times daily. 60 tablet 1   cetirizine  (ZYRTEC ) 10 MG tablet Take 1 tablet (10 mg total) by mouth daily as needed for allergies. 90 tablet 3   clotrimazole -betamethasone  (LOTRISONE ) cream Apply 1 Application topically 2 (two) times daily. 60 g 1   ezetimibe  (ZETIA ) 10 MG tablet  Take 1 tablet (10 mg total) by mouth daily. 90 tablet 3   fluconazole  (DIFLUCAN ) 150 MG tablet Take 150 mg by mouth once.     levonorgestrel  (MIRENA ) 20 MCG/DAY IUD 1 each by Intrauterine route once.     lisinopril  (ZESTRIL ) 10 MG tablet Take 1 tablet (10 mg total) by mouth daily. 90 tablet 3   Multiple Vitamin (MULTIVITAMIN) capsule Take 1 capsule by mouth daily.      nitroGLYCERIN  (NITROSTAT ) 0.4 MG SL tablet Place 1 tablet (0.4 mg total) under the tongue every 5 (five) minutes as needed for chest pain. 25 tablet 3   omeprazole  (PRILOSEC) 20 MG capsule Take 1 capsule by mouth in the morning 60 capsule 0   rosuvastatin  (CRESTOR ) 40 MG tablet Take 1 tablet (40 mg total) by mouth daily. 90 tablet 3   Semaglutide -Weight Management (WEGOVY ) 0.5 MG/0.5ML SOAJ INJECT 0.5 MG INTO THE SKIN ONCE A WEEK 2 mL 0   sertraline  (ZOLOFT ) 100 MG tablet Take 1 tablet (100 mg total) by mouth daily. 90 tablet 1   Vitamin D , Ergocalciferol , (DRISDOL ) 1.25 MG (50000 UNIT) CAPS capsule Take 1 capsule (50,000 Units total) by mouth every 7 (seven) days. (Patient not taking: Reported on 09/21/2023) 5 capsule 0   No current facility-administered medications for this visit.    Medication Side Effects: None  Allergies:  Allergies  Allergen Reactions   Wound Dressing Adhesive Dermatitis and Rash    Past Medical History:  Diagnosis Date   Anxiety    Back pain    CAD (coronary artery disease) 07/2011   cardiologist--- dr lavona--   a. 07/2011 NSTEMI/Cath: RCA 99%m, otw nl cors, EF 50%, inf hk -> RCA stented with 3.0x6mm Promus DES   Depression    Fatty liver    GERD (gastroesophageal reflux disease)    Heart disease    History of non-ST elevation myocardial infarction (NSTEMI) 07/2011   s/p PCI and stenting   History of sepsis 04/2021   hospital admission---  sepsis due to infected mesh post op diaphramatic hernia repair,  intrathoracic abscess w/ pleural effusion   Hyperlipidemia    Hypertension    Morgagni hernia    followed by dr h. littlefoot (cardiac / thoracic surgeon)  lov note in epic 10-17-2021 pt released prn//    a. Discovered incidentally on CT 07/2011  (diaphragmatic hernia);    02-24-2021  s/p repair with mesh;  hernia recurrence 04-04-2021/  02/ 2023 infected mesh w/ sepsis,  s/p unroof abscess/ pleural irrigation system placed/   re-do hernia repair 06-02-2021    Myocardial infarction [facility])    2013   Obesity    Obesity    S/P drug eluting coronary stent placement 08/10/2011   DES x1  to  mRCA   Sinus bradycardia    a. Nocturnal, asymptomatic   Sun allergy    per pt dx via testing    Family History  Problem Relation Age of Onset   Diabetes Mother    Coronary artery disease Mother 52        stent   Heart attack Mother    Heart disease Mother    Hyperlipidemia Mother    Obesity Mother    Colon polyps Mother    Hypertension Father    Arrhythmia Father    Depression Father    Coronary artery disease Maternal Aunt 44       (Mother's twin sister)   Lung cancer Maternal Aunt    Heart attack Maternal Aunt  mother's twin   Colon polyps Maternal Aunt    Colon cancer Maternal Uncle    Coronary artery disease Maternal Uncle 46   Heart attack Maternal Uncle    Breast cancer Maternal Grandmother    Breast cancer Cousin 21   Cancer Neg Hx    Early death Neg Hx    Kidney disease Neg Hx    Stroke Neg Hx    Alcohol abuse Neg Hx    Drug abuse Neg Hx     Social History   Socioeconomic History   Marital status: Significant Other    Spouse name: Not on file   Number of children: 0   Years of education: Not on file   Highest education level: Not on file  Occupational History   Occupation: Realtor  Tobacco Use   Smoking status: Former    Current packs/day: 0.00    Average packs/day: 1 pack/day for 18.0 years (18.0 ttl pk-yrs)    Types: Cigarettes    Start date: 08/08/1993    Quit date: 08/09/2011    Years since quitting: 12.2   Smokeless tobacco: Never   Tobacco comments:    2013  Vaping Use   Vaping status: Never Used  Substance and Sexual Activity   Alcohol use: Yes    Comment: occasionally   Drug use: Never   Sexual activity: Yes    Birth control/protection: I.U.D.  Other Topics Concern   Not on file  Social History Narrative   Lives alone.  Occasional EtOH.  Works as a Veterinary surgeon.   Social Drivers of Health    Financial Resource Strain: Patient Declined (09/20/2023)   Overall Financial Resource Strain (CARDIA)    Difficulty of Paying Living Expenses: Patient declined  Food Insecurity: Patient Declined (09/20/2023)   Hunger Vital Sign    Worried About Running Out of Food in the Last Year: Patient declined    Ran Out of Food in the Last Year: Patient declined  Transportation Needs: Patient Declined (09/20/2023)   PRAPARE - Administrator, Civil Service (Medical): Patient declined    Lack of Transportation (Non-Medical): Patient declined  Physical Activity: Insufficiently Active (09/20/2023)   Exercise Vital Sign    Days of Exercise per Week: 1 day    Minutes of Exercise per Session: 30 min  Stress: No Stress Concern Present (09/20/2023)   [employer] of Occupational Health - Occupational Stress Questionnaire    Feeling of Stress: Not at all  Social Connections: Unknown (09/20/2023)   Social Connection and Isolation Panel    Frequency of Communication with Friends and Family: Patient declined    Frequency of Social Gatherings with Friends and Family: Patient declined    Attends Religious Services: Patient declined    Database administrator or Organizations: Patient declined    Attends Banker Meetings: Not on file    Marital Status: Patient declined  Recent Concern: Social Connections - Socially Isolated (07/30/2023)   Social Connection and Isolation Panel    Frequency of Communication with Friends and Family: Three times a week    Frequency of Social Gatherings with Friends and Family: Three times a week    Attends Religious Services: Never    Active Member of Clubs or Organizations: No    Attends Banker Meetings: Never    Marital Status: Separated  Intimate Partner Violence: Patient Declined (07/30/2023)   Humiliation, Afraid, Rape, and Kick questionnaire    Fear of Current or Ex-Partner: Patient declined  Emotionally Abused: Patient declined     Physically Abused: Patient declined    Sexually Abused: Patient declined    Past Medical History, Surgical history, Social history, and Family history were reviewed and updated as appropriate.   Please see review of systems for further details on the patient's review from today.   Objective:   Physical Exam:  There were no vitals taken for this visit.  Physical Exam Constitutional:      General: She is not in acute distress. Musculoskeletal:        General: No deformity.  Neurological:     Mental Status: She is alert and oriented to person, place, and time.     Coordination: Coordination normal.  Psychiatric:        Attention and Perception: Attention and perception normal. She does not perceive auditory or visual hallucinations.        Mood and Affect: Mood normal. Mood is not anxious or depressed. Affect is not labile, blunt, angry or inappropriate.        Speech: Speech normal.        Behavior: Behavior normal.        Thought Content: Thought content normal. Thought content is not paranoid or delusional. Thought content does not include homicidal or suicidal ideation. Thought content does not include homicidal or suicidal plan.        Cognition and Memory: Cognition and memory normal.        Judgment: Judgment normal.     Comments: Insight intact     Lab Review:     Component Value Date/Time   NA 138 09/17/2023 0914   NA 141 01/18/2023 1045   K 4.7 09/17/2023 0914   CL 102 09/17/2023 0914   CO2 24 09/17/2023 0914   GLUCOSE 126 (H) 09/17/2023 0914   BUN 10 09/17/2023 0914   BUN 9 01/18/2023 1045   CREATININE 0.88 09/17/2023 0914   CALCIUM  9.4 09/17/2023 0914   PROT 7.0 09/17/2023 0914   PROT 6.9 01/18/2023 1045   ALBUMIN  4.2 01/18/2023 1045   AST 44 (H) 09/17/2023 0914   ALT 38 (H) 09/17/2023 0914   ALKPHOS 96 01/18/2023 1045   BILITOT 0.5 09/17/2023 0914   BILITOT 0.5 01/18/2023 1045   GFRNONAA >60 06/04/2021 0502   GFRNONAA 97 07/30/2020 0944   GFRAA 113  07/30/2020 0944       Component Value Date/Time   WBC 7.9 09/17/2023 0914   RBC 5.27 (H) 09/17/2023 0914   HGB 16.0 (H) 09/17/2023 0914   HGB 15.6 01/18/2023 1045   HCT 48.7 (H) 09/17/2023 0914   HCT 47.5 (H) 01/18/2023 1045   PLT 288 09/17/2023 0914   PLT 321 01/18/2023 1045   MCV 92.4 09/17/2023 0914   MCV 94 01/18/2023 1045   MCH 30.4 09/17/2023 0914   MCHC 32.9 09/17/2023 0914   RDW 12.8 09/17/2023 0914   RDW 13.0 01/18/2023 1045   LYMPHSABS 3.3 (H) 01/18/2023 1045   MONOABS 0.8 05/29/2021 0540   EOSABS 142 09/17/2023 0914   EOSABS 0.1 01/18/2023 1045   BASOSABS 63 09/17/2023 0914   BASOSABS 0.1 01/18/2023 1045    No results found for: POCLITH, LITHIUM   No results found for: PHENYTOIN, PHENOBARB, VALPROATE, CBMZ   .res Assessment: Plan:    Treatment Plan/Recommendations:   Plan:  PDMP reviewed   Wellbutrin  SR 150mg  daily - taking one tablet Xanax  1mg  BID - has not taken in months Zoloft  100mg  daily a few weeks ago  RTC  3 months  20 minutes spent dedicated to the care of this patient on the date of this encounter to include pre-visit review of records, ordering of medication, post visit documentation, and face-to-face time with the patient discussing depression and anxiety. Discussed continuing current medication.   Patient advised to contact office with any questions, adverse effects, or acute worsening in signs and symptoms. Diagnoses and all orders for this visit:  Depression with anxiety -     sertraline  (ZOLOFT ) 100 MG tablet; Take 1 tablet (100 mg total) by mouth daily.     Please see After Visit Summary for patient specific instructions.  Future Appointments  Date Time Provider Department Center  11/16/2023  9:45 AM Darrell Bruckner, [facility] H[facility]  12/24/2023  9:10 AM [facility]  12/28/2023  9:30 AM Baxley, Ronal PARAS, MD [facility]    No orders of the defined types were placed in this encounter.      -------------------------------
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
## 2023-10-30 ENCOUNTER — Encounter: Payer: Self-pay | Admitting: Obstetrics and Gynecology

## 2023-11-02 ENCOUNTER — Ambulatory Visit: Admitting: Psychology

## 2023-11-02 ENCOUNTER — Telehealth: Payer: Self-pay | Admitting: Internal Medicine

## 2023-11-09 ENCOUNTER — Ambulatory Visit (INDEPENDENT_AMBULATORY_CARE_PROVIDER_SITE_OTHER): Admitting: Psychology

## 2023-11-09 DIAGNOSIS — F331 Major depressive disorder, recurrent, moderate: Secondary | ICD-10-CM

## 2023-11-09 DIAGNOSIS — F411 Generalized anxiety disorder: Secondary | ICD-10-CM
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
## 2023-11-10 ENCOUNTER — Encounter: Payer: Self-pay | Admitting: Internal Medicine

## 2023-11-10 DIAGNOSIS — E119 Type 2 diabetes mellitus without complications: Secondary | ICD-10-CM | POA: Diagnosis not present

## 2023-11-10 LAB — OPHTHALMOLOGY REPORT-SCANNED

## 2023-11-11 ENCOUNTER — Telehealth: Payer: Self-pay

## 2023-11-11 NOTE — Telephone Encounter (Signed)
 I placed it in the basket up front ready for pick up.  Copied from CRM #[id number]. Topic: Referral - Question >> Nov 10, 2023 12:18 PM Sophia H wrote: Reason for CRM: Patient states she is needing a paper copy of the referral for [facility]. They sometimes ask for it and just wants to be sure she has it just in case. Please advise when she can pick up. # [phone]

## 2023-11-12 ENCOUNTER — Other Ambulatory Visit: Payer: Self-pay | Admitting: Internal Medicine

## 2023-11-12 DIAGNOSIS — F418 Other specified anxiety disorders: Secondary | ICD-10-CM

## 2023-11-12 LAB — HM DEXA SCAN: HM Dexa Scan: NORMAL

## 2023-11-16 ENCOUNTER — Ambulatory Visit: Attending: Cardiology | Admitting: Pharmacist

## 2023-11-16 ENCOUNTER — Encounter: Payer: Self-pay | Admitting: Internal Medicine

## 2023-11-16 ENCOUNTER — Ambulatory Visit: Admitting: Pharmacist Clinician (PhC)/ Clinical Pharmacy Specialist

## 2023-11-16 ENCOUNTER — Encounter: Payer: Self-pay | Admitting: Pharmacist

## 2023-11-16 ENCOUNTER — Telehealth: Payer: Self-pay | Admitting: Pharmacist

## 2023-11-16 ENCOUNTER — Ambulatory Visit (INDEPENDENT_AMBULATORY_CARE_PROVIDER_SITE_OTHER): Admitting: Psychology

## 2023-11-16 ENCOUNTER — Other Ambulatory Visit (HOSPITAL_COMMUNITY): Payer: Self-pay

## 2023-11-16 DIAGNOSIS — I2510 Atherosclerotic heart disease of native coronary artery without angina pectoris: Secondary | ICD-10-CM

## 2023-11-16 DIAGNOSIS — F331 Major depressive disorder, recurrent, moderate: Secondary | ICD-10-CM | POA: Diagnosis not present

## 2023-11-16 DIAGNOSIS — E119 Type 2 diabetes mellitus without complications: Secondary | ICD-10-CM

## 2023-11-16 DIAGNOSIS — F411 Generalized anxiety disorder: Secondary | ICD-10-CM

## 2023-11-16 NOTE — Patient Instructions (Addendum)
 Good seeing you again  We would like your LDL (bad cholesterol) to be less than 55  Please continue your rosuvastatin  40mg  and ezetimibe  10mg  daily  The medication we discussed today is called Repatha  which is an injection you would take once every 2 weeks  After you start the medication we will recheck your fasting lipid panel in 2-3 months  I will also run a prior authorization for Ozempic and Mounjaro  Message me with any questions  Chris Arshad Oberholzer, PharmD, BCACP, CDCES, CPP [facility] [address], [place], [place] [address] Phone: [phone]; Fax: [phone] 11/16/2023 9:52 AM

## 2023-11-16 NOTE — Telephone Encounter (Signed)
 Please complete PA for repatha/praluent

## 2023-11-16 NOTE — Telephone Encounter (Signed)
 Please complete PA for ozempic 1.0mg .  Diabetes diagnosis

## 2023-11-16 NOTE — Telephone Encounter (Signed)
 Pharmacy Patient Advocate Encounter   Received notification from Physician's Office that prior authorization for OZEMPIC is required/requested.   Insurance verification completed.   The patient is insured through [facility] .   Per test claim: PA required; PA submitted to above mentioned insurance via Latent Key/confirmation #/EOC [id number] Status is pending
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
## 2023-11-16 NOTE — Telephone Encounter (Addendum)
 Pharmacy Patient Advocate Encounter   Received notification from Physician's Office that prior authorization for REPATHA  is required/requested.   Insurance verification completed.   The patient is insured through [facility] .   Per test claim: PA required; PA submitted to above mentioned insurance via Latent Key/confirmation #/EOC [id number] Status is pending

## 2023-11-16 NOTE — Progress Notes (Signed)
 Patient ID: Rhonda Colon                 DOB: 1976/01/25                    MRN: [id number]     HPI: Rhonda Colon is a 48 y.o. female patient referred to lipid clinic by Dr Lavona. PMH is significant for CAD, HTN, T2DM, HLD, elevated LPA, and obesity.  [name]  [name] Has been paying cash price and completed semaglutide  0.5mg .   Recent T2DM diagnosis. A1c 6.9%.    Current Medications:  Rosuvastatin  40mg  daily Ezetimbe 10mg  daily  Intolerances: N/A  Risk Factors:  HTN CAD Hx of NSTEMI T2DM Elevated LPA  LDL goal: <55  Labs: LPA 222.8, TC 146, HDL 48, LDL 83, Trigs 74 (3/79/74)  Past Medical History:  Diagnosis Date   Anxiety    Back pain    CAD (coronary artery disease) 07/2011   cardiologist--- dr lavona--   a. 07/2011 NSTEMI/Cath: RCA 99%m, otw nl cors, EF 50%, inf hk -> RCA stented with 3.0x8mm Promus DES   Depression    Fatty liver    GERD (gastroesophageal reflux disease)    Heart disease    History of non-ST elevation myocardial infarction (NSTEMI) 07/2011   s/p PCI and stenting   History of sepsis 04/2021   hospital admission---  sepsis due to infected mesh post op diaphramatic hernia repair,  intrathoracic abscess w/ pleural effusion   Hyperlipidemia    Hypertension    Morgagni hernia    followed by dr h. littlefoot (cardiac / thoracic surgeon)  lov note in epic 10-17-2021 pt released prn//    a. Discovered incidentally on CT 07/2011  (diaphragmatic hernia);    02-24-2021  s/p repair with mesh;  hernia recurrence 04-04-2021/  02/ 2023 infected mesh w/ sepsis,  s/p unroof abscess/ pleural irrigation system placed/   re-do hernia repair 06-02-2021    Myocardial infarction [facility])    2013   Obesity    Obesity    S/P drug eluting coronary stent placement 08/10/2011   DES x1  to  mRCA   Sinus bradycardia    a. Nocturnal, asymptomatic   Sun allergy    per pt dx via testing    Current Outpatient Medications on File Prior to Visit  Medication Sig Dispense Refill   acetaminophen  (TYLENOL ) 500 MG tablet Take 500 mg by mouth every 8 (eight) hours as needed for moderate pain.     ALPRAZolam  (XANAX ) 1 MG tablet Take 1 tablet by mouth twice daily as needed for anxiety 30 tablet 1   aspirin  81 MG tablet Take 81 mg by mouth daily.     buPROPion  (WELLBUTRIN  SR) 150 MG 12 hr tablet Take 1 tablet by mouth twice daily 60 tablet 0   cetirizine  (ZYRTEC ) 10 MG tablet Take 1 tablet (10 mg total) by mouth daily as needed for allergies. 90 tablet 3   clotrimazole -betamethasone  (LOTRISONE ) cream Apply 1 Application topically 2 (two) times daily. 60 g 1   ezetimibe  (ZETIA ) 10 MG tablet Take 1 tablet (10 mg total) by mouth daily. 90 tablet 3   fluconazole  (DIFLUCAN ) 150 MG tablet Take 150 mg by mouth  once.     levonorgestrel  (MIRENA ) 20 MCG/DAY IUD 1 each by Intrauterine route once.     lisinopril  (ZESTRIL ) 10 MG tablet Take 1 tablet (10 mg total) by mouth daily. 90 tablet 3   Multiple Vitamin (MULTIVITAMIN) capsule Take 1 capsule by mouth daily.     nitroGLYCERIN  (NITROSTAT ) 0.4 MG SL tablet Place 1 tablet (0.4 mg total) under the tongue every 5 (five) minutes as needed for chest pain. 25 tablet 3   omeprazole  (PRILOSEC) 20 MG capsule Take 1 capsule by mouth in the morning 60 capsule 0   rosuvastatin  (CRESTOR ) 40 MG tablet Take 1 tablet (40 mg total) by mouth daily. 90 tablet 3   Semaglutide -Weight Management (WEGOVY ) 0.5 MG/0.5ML SOAJ INJECT 0.5 MG INTO THE SKIN ONCE A WEEK 2 mL 0   sertraline  (ZOLOFT ) 100 MG tablet Take 1 tablet (100 mg total) by mouth daily. 90 tablet 1   Vitamin D , Ergocalciferol , (DRISDOL ) 1.25 MG (50000 UNIT) CAPS capsule Take 1  capsule (50,000 Units total) by mouth every 7 (seven) days. (Patient not taking: Reported on 09/21/2023) 5 capsule 0   No current facility-administered medications on file prior to visit.    Allergies  Allergen Reactions   Wound Dressing Adhesive Dermatitis and Rash    Assessment/Plan:  1. Hyperlipidemia - Patient last LDL of 83 is above goal of <55. Aggressive goal due to history of NSTEMI, T2DM, and elevated LPA. Recommend addition of PCSk9i.  Using demo pen, educated patient on mechanism of action, storage, site selection, administration, and possible adverse effects. Will complete PA and contact patient with result. Recheck fasting lipid panel in 203 months.  Will complete PA for semaglutide /tirzepatide since patient has confirmed DM diagnosis.  Continue ezetimbe 10mg  daily Continue rosuvastatin  40mg  daily Start Repatha  140mg  q 2 weeks

## 2023-11-17 ENCOUNTER — Other Ambulatory Visit (HOSPITAL_COMMUNITY): Payer: Self-pay

## 2023-11-17 MED ORDER — TRULICITY 0.75 MG/0.5ML ~~LOC~~ SOAJ: 0.7500 mg | SUBCUTANEOUS | 0 refills | Status: DC

## 2023-11-17 MED ORDER — REPATHA SURECLICK 140 MG/ML ~~LOC~~ SOAJ: 1.0000 mL | SUBCUTANEOUS | 1 refills | Status: DC

## 2023-11-17 NOTE — Addendum Note (Signed)
 Addended by: JACKSON SHUCK ANN on: 11/17/2023 12:53 PM   Modules accepted: Level of Service

## 2023-11-17 NOTE — Telephone Encounter (Signed)
 Pharmacy Patient Advocate Encounter  Received notification from [facility] that Prior Authorization for REPATHA  has been APPROVED from 11/16/23 to 11/15/24. Ran test claim, Copay is $15. This test claim was processed through [facility]- copay amounts may vary at other pharmacies due to pharmacy/plan contracts, or as the patient moves through the different stages of their insurance plan.

## 2023-11-17 NOTE — Telephone Encounter (Signed)
 Pharmacy Patient Advocate Encounter  Received notification from [facility] that Prior Authorization for [facility] has been DENIED.  Full denial letter will be uploaded to the media tab. See denial reason below.  MUST TRY AND FAIL PREFERRED FIRST (RYBELSUS, TRULICITY , VICTOZA)

## 2023-11-17 NOTE — Addendum Note (Signed)
 Addended by: DARRELL BRUCKNER on: 11/17/2023 05:17 PM   Modules accepted: Orders

## 2023-11-17 NOTE — Telephone Encounter (Signed)
 Called and spoke with patient. Advised Repatha  PA was approved but Ozempic was denied because plan prefers Trulicity .  Will start Trulicity  0.75mg  once weekly

## 2023-11-18 ENCOUNTER — Other Ambulatory Visit (HOSPITAL_COMMUNITY): Payer: Self-pay

## 2023-11-18 ENCOUNTER — Encounter: Payer: Self-pay | Admitting: Pharmacist

## 2023-11-18 ENCOUNTER — Telehealth: Payer: Self-pay | Admitting: Pharmacy Technician

## 2023-11-18 DIAGNOSIS — E119 Type 2 diabetes mellitus without complications: Secondary | ICD-10-CM

## 2023-11-18 DIAGNOSIS — I2510 Atherosclerotic heart disease of native coronary artery without angina pectoris: Secondary | ICD-10-CM

## 2023-11-18 NOTE — Telephone Encounter (Signed)
 Pharmacy Patient Advocate Encounter   Received notification from Fax that prior authorization for Trulicity  is required/requested.   Insurance verification completed.   The patient is insured through [employer] .   Per test claim: PA required; PA submitted to above mentioned insurance via Latent Key/confirmation #/EOC [facility] Status is pending

## 2023-11-18 NOTE — Telephone Encounter (Signed)
 Pharmacy Patient Advocate Encounter  Received notification from [employer] that Prior Authorization for trulicity  has been APPROVED from 11/18/23 to 11/17/24. Ran test claim, Copay is $15.00- one month. This test claim was processed through [facility]- copay amounts may vary at other pharmacies due to pharmacy/plan contracts, or as the patient moves through the different stages of their insurance plan.   PA #/Case ID/Reference #: [id number]

## 2023-11-23 ENCOUNTER — Ambulatory Visit: Admitting: Psychology

## 2023-11-23 DIAGNOSIS — G4733 Obstructive sleep apnea (adult) (pediatric): Secondary | ICD-10-CM | POA: Diagnosis not present

## 2023-11-23 DIAGNOSIS — F411 Generalized anxiety disorder: Secondary | ICD-10-CM | POA: Diagnosis not present

## 2023-11-23 DIAGNOSIS — F331 Major depressive disorder, recurrent, moderate: Secondary | ICD-10-CM
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
## 2023-11-30 ENCOUNTER — Encounter: Payer: Self-pay | Admitting: Obstetrics and Gynecology

## 2023-11-30 ENCOUNTER — Other Ambulatory Visit (HOSPITAL_COMMUNITY)
Admission: RE | Admit: 2023-11-30 | Discharge: 2023-11-30 | Disposition: A | Discharge status: Home / Self Care | Source: Ambulatory Visit | Attending: Obstetrics and Gynecology | Admitting: Obstetrics and Gynecology

## 2023-11-30 ENCOUNTER — Ambulatory Visit (INDEPENDENT_AMBULATORY_CARE_PROVIDER_SITE_OTHER): Admitting: Obstetrics and Gynecology

## 2023-11-30 VITALS — BP 124/78 | HR 103 | Ht 64.96 in | Wt 345.6 lb

## 2023-11-30 DIAGNOSIS — Z01419 Encounter for gynecological examination (general) (routine) without abnormal findings: Secondary | ICD-10-CM | POA: Insufficient documentation

## 2023-11-30 DIAGNOSIS — Z1331 Encounter for screening for depression: Secondary | ICD-10-CM | POA: Diagnosis not present

## 2023-11-30 DIAGNOSIS — Z30432 Encounter for removal of intrauterine contraceptive device: Secondary | ICD-10-CM | POA: Diagnosis not present

## 2023-11-30 DIAGNOSIS — N951 Menopausal and female climacteric states: Secondary | ICD-10-CM | POA: Diagnosis not present

## 2023-11-30 DIAGNOSIS — N898 Other specified noninflammatory disorders of vagina: Secondary | ICD-10-CM

## 2023-11-30 NOTE — Progress Notes (Addendum)
 48 y.o. y.o. female here for annual exam. No LMP recorded. (Menstrual status: IUD).   H/O hysteroscopy, D&C, mirena  IUD insertion with ultrasound guidance on 11/24/21. Pathology with inactive endometrium. IUD removed today 11/30/23  Spotting here and there  H/O heart disease, s/p MI at 67.   Mild, tolerable GSI.   She has hot flashes and night sweats. Not a candidate for HRT. She was given a script for gabapentin  last year and never tried it. She would like to try it. Hot flashes are worse during the day, having 3-4 a day. Having night sweats ~5 x a week. Will try the gabapentin  at night.   Patient's last menstrual period was 08/25/2022.          Sexually active: Yes.    The current method of family planning is IUD.    Exercising: Yes.    Walking  Smoker:  no Divorced. Would like to remove the IUD to see if she feels different without it Health Maintenance: Pap: 08/09/20 WNL Hr HPV Neg; 08-02-17 negative  History of abnormal Pap:  no MMG:  10/24 bi-rads1 neg BMD: n/a  Colonoscopy: 02/26/22, + benign polyp, f/u 5 years family history of colon cancer and breast cancer TDaP:  08/08/13 Gardasil: n/a Dxa 11/12/23 normal Body mass index is 57.58 kg/m.     12/28/2023    9:32 AM 11/30/2023    1:39 PM 09/21/2023   10:28 AM  Depression screen PHQ 2/9  Decreased Interest 0 0 0  Down, Depressed, Hopeless 0 0 0  PHQ - 2 Score 0 0 0  Altered sleeping 0    Tired, decreased energy 0    Change in appetite 0    Feeling bad or failure about yourself  0    Trouble concentrating 0    Moving slowly or fidgety/restless 0    Suicidal thoughts 0    PHQ-9 Score 0    Difficult doing work/chores Not difficult at all  Not difficult at all     Blood pressure 124/78, pulse (!) 103, height 5' 4.96 (1.65 m), weight (!) 345 lb 9.6 oz (156.8 kg), SpO2 95%.     Component Value Date/Time   DIAGPAP  08/09/2020 1102    - Negative for intraepithelial lesion or malignancy (NILM)   DIAGPAP  08/02/2017  0000    NEGATIVE FOR INTRAEPITHELIAL LESIONS OR MALIGNANCY.   HPVHIGH Negative 08/09/2020 1102   ADEQPAP  08/09/2020 1102    Satisfactory for evaluation; transformation zone component PRESENT.   ADEQPAP  08/02/2017 0000    Satisfactory for evaluation  endocervical/transformation zone component PRESENT.    GYN HISTORY:    Component Value Date/Time   DIAGPAP  08/09/2020 1102    - Negative for intraepithelial lesion or malignancy (NILM)   DIAGPAP  08/02/2017 0000    NEGATIVE FOR INTRAEPITHELIAL LESIONS OR MALIGNANCY.   HPVHIGH Negative 08/09/2020 1102   ADEQPAP  08/09/2020 1102    Satisfactory for evaluation; transformation zone component PRESENT.   ADEQPAP  08/02/2017 0000    Satisfactory for evaluation  endocervical/transformation zone component PRESENT.    OB History  Gravida Para Term Preterm AB Living  0 0 0 0 0 0  SAB IAB Ectopic Multiple Live Births  0 0 0 0 0    Past Medical History:  Diagnosis Date   Anxiety    Back pain    CAD (coronary artery disease) 07/2011   cardiologist--- dr hochrein--   a. 07/2011 NSTEMI/Cath: RCA 99%m, otw nl  cors, EF 50%, inf hk -> RCA stented with 3.0x6mm Promus DES   Depression    Fatty liver    GERD (gastroesophageal reflux disease)    Heart disease    History of non-ST elevation myocardial infarction (NSTEMI) 07/2011   s/p PCI and stenting   History of sepsis 04/2021   hospital admission---  sepsis due to infected mesh post op diaphramatic hernia repair,  intrathoracic abscess w/ pleural effusion   Hyperlipidemia    Hypertension    Morgagni hernia    followed by dr h. littlefoot (cardiac / thoracic surgeon)  lov note in epic 10-17-2021 pt released prn//    a. Discovered incidentally on CT 07/2011  (diaphragmatic hernia);    02-24-2021  s/p repair with mesh;  hernia recurrence 04-04-2021/  02/ 2023 infected mesh w/ sepsis,  s/p unroof abscess/ pleural irrigation system placed/   re-do hernia repair 06-02-2021   Myocardial infarction  [facility])    2013   Obesity    Obesity    S/P drug eluting coronary stent placement 08/10/2011   DES x1  to  mRCA   Sinus bradycardia    a. Nocturnal, asymptomatic   Sun allergy    per pt dx via testing    Past Surgical History:  Procedure Laterality Date   CARDIAC CATHETERIZATION  08/10/2011   left heart, with angiogram; DES mid RCA   COLONOSCOPY WITH PROPOFOL  N/A 02/26/2022   Procedure: COLONOSCOPY WITH PROPOFOL ;  Surgeon: Federico Rosario BROCKS, MD;  Location: [facility];  Service: Gastroenterology;  Laterality: N/A;   DIAPHRAGMATIC HERNIA REPAIR  06/02/2021   with mesh removal due to sepsis   DILATATION & CURETTAGE/HYSTEROSCOPY WITH MYOSURE N/A 11/24/2021   Procedure: DILATATION & CURETTAGE/HYSTEROSCOPY;  Surgeon: Jannis Kate Norris, MD;  Location: [facility];  Service: Gynecology;  Laterality: N/A;   HEMOSTASIS CLIP PLACEMENT  02/26/2022   Procedure: HEMOSTASIS CLIP PLACEMENT;  Surgeon: Federico Rosario BROCKS, MD;  Location: [facility];  Service: Gastroenterology;;   INTERCOSTAL NERVE BLOCK Right 05/22/2021   Procedure: INTERCOSTAL NERVE BLOCK;  Surgeon: Shyrl Linnie KIDD, MD;  Location: [facility];  Service: Thoracic;  Laterality: Right;   INTRAUTERINE DEVICE (IUD) INSERTION N/A 11/24/2021   Procedure: Mirena  INTRAUTERINE DEVICE (IUD) INSERTION;  Surgeon: Jannis Kate Norris, MD;  Location: [facility];  Service: Gynecology;  Laterality: N/A;   IR RADIOLOGIST EVAL & MGMT  05/05/2021   LEFT HEART CATHETERIZATION WITH CORONARY ANGIOGRAM N/A 08/10/2011   Procedure: LEFT HEART CATHETERIZATION WITH CORONARY ANGIOGRAM;  Surgeon: Lonni JONETTA Cash, MD;  Location: [facility] CATH LAB;  Service: Cardiovascular;  Laterality: N/A;   OPERATIVE ULTRASOUND N/A 11/24/2021   Procedure: OPERATIVE ULTRASOUND;  Surgeon: Jannis Kate Norris, MD;  Location: [facility];  Service: Gynecology;  Laterality: N/A;   POLYPECTOMY  02/26/2022   Procedure: POLYPECTOMY;  Surgeon:  Federico Rosario BROCKS, MD;  Location: [facility];  Service: Gastroenterology;;   TONSILLECTOMY     child   XI ROBOTIC ASSISTED REPAIR OF DIAPHRAGMATIC HERNIA  02/24/2021   @[facility]   by dr palmer;   w/ mesh    Current Outpatient Medications on File Prior to Visit  Medication Sig Dispense Refill   acetaminophen  (TYLENOL ) 500 MG tablet Take 500 mg by mouth every 8 (eight) hours as needed for moderate pain.     ALPRAZolam  (XANAX ) 1 MG tablet Take 1 tablet by mouth twice daily as needed for anxiety 30 tablet 1   aspirin  81 MG tablet Take 81 mg by  mouth daily.     buPROPion  (WELLBUTRIN  SR) 150 MG 12 hr tablet Take 1 tablet by mouth twice daily 60 tablet 0   cetirizine  (ZYRTEC ) 10 MG tablet Take 1 tablet (10 mg total) by mouth daily as needed for allergies. 90 tablet 3   clotrimazole -betamethasone  (LOTRISONE ) cream Apply 1 Application topically 2 (two) times daily. 60 g 1   Dulaglutide  (TRULICITY ) 0.75 MG/0.5ML SOAJ Inject 0.75 mg into the skin once a week. 2 mL 0   Evolocumab  (REPATHA  SURECLICK) 140 MG/ML SOAJ Inject 140 mg into the skin every 14 (fourteen) days. 6 mL 1   ezetimibe  (ZETIA ) 10 MG tablet Take 1 tablet (10 mg total) by mouth daily. 90 tablet 3   fluconazole  (DIFLUCAN ) 150 MG tablet Take 150 mg by mouth once.     levonorgestrel  (MIRENA ) 20 MCG/DAY IUD 1 each by Intrauterine route once.     lisinopril  (ZESTRIL ) 10 MG tablet Take 1 tablet (10 mg total) by mouth daily. 90 tablet 3   Multiple Vitamin (MULTIVITAMIN) capsule Take 1 capsule by mouth daily.     nitroGLYCERIN  (NITROSTAT ) 0.4 MG SL tablet Place 1 tablet (0.4 mg total) under the tongue every 5 (five) minutes as needed for chest pain. 25 tablet 3   omeprazole  (PRILOSEC) 20 MG capsule Take 1 capsule by mouth in the morning 60 capsule 0   rosuvastatin  (CRESTOR ) 40 MG tablet Take 1 tablet (40 mg total) by mouth daily. 90 tablet 3   sertraline  (ZOLOFT ) 100 MG tablet Take 1 tablet (100 mg total) by mouth daily. 90 tablet 1   No current  facility-administered medications on file prior to visit.    Social History   Socioeconomic History   Marital status: Significant Other    Spouse name: Not on file   Number of children: 0   Years of education: Not on file   Highest education level: Not on file  Occupational History   Occupation: Realtor  Tobacco Use   Smoking status: Former    Current packs/day: 0.00    Average packs/day: 1 pack/day for 18.0 years (18.0 ttl pk-yrs)    Types: Cigarettes    Start date: 08/08/1993    Quit date: 08/09/2011    Years since quitting: 12.3   Smokeless tobacco: Never   Tobacco comments:    2013  Vaping Use   Vaping status: Never Used  Substance and Sexual Activity   Alcohol use: Yes    Comment: occasionally   Drug use: Never   Sexual activity: Not Currently    Partners: Male    Birth control/protection: I.U.D.  Other Topics Concern   Not on file  Social History Narrative   Lives alone.  Occasional EtOH.  Works as a Veterinary surgeon.   Social Drivers of Health   Financial Resource Strain: Patient Declined (09/20/2023)   Overall Financial Resource Strain (CARDIA)    Difficulty of Paying Living Expenses: Patient declined  Food Insecurity: Patient Declined (09/20/2023)   Hunger Vital Sign    Worried About Running Out of Food in the Last Year: Patient declined    Ran Out of Food in the Last Year: Patient declined  Transportation Needs: Patient Declined (09/20/2023)   PRAPARE - Administrator, Civil Service (Medical): Patient declined    Lack of Transportation (Non-Medical): Patient declined  Physical Activity: Insufficiently Active (09/20/2023)   Exercise Vital Sign    Days of Exercise per Week: 1 day    Minutes of Exercise per Session: 30 min  Stress: No Stress Concern Present (09/20/2023)   [employer] of Occupational Health - Occupational Stress Questionnaire    Feeling of Stress: Not at all  Social Connections: Unknown (09/20/2023)   Social Connection and Isolation  Panel    Frequency of Communication with Friends and Family: Patient declined    Frequency of Social Gatherings with Friends and Family: Patient declined    Attends Religious Services: Patient declined    Database administrator or Organizations: Patient declined    Attends Banker Meetings: Not on file    Marital Status: Patient declined  Recent Concern: Social Connections - Socially Isolated (07/30/2023)   Social Connection and Isolation Panel    Frequency of Communication with Friends and Family: Three times a week    Frequency of Social Gatherings with Friends and Family: Three times a week    Attends Religious Services: Never    Active Member of Clubs or Organizations: No    Attends Banker Meetings: Never    Marital Status: Separated  Intimate Partner Violence: Patient Declined (07/30/2023)   Humiliation, Afraid, Rape, and Kick questionnaire    Fear of Current or Ex-Partner: Patient declined    Emotionally Abused: Patient declined    Physically Abused: Patient declined    Sexually Abused: Patient declined    Family History  Problem Relation Age of Onset   Diabetes Mother    Coronary artery disease Mother 61        stent   Heart attack Mother    Heart disease Mother    Hyperlipidemia Mother    Obesity Mother    Colon polyps Mother    Hypertension Father    Arrhythmia Father    Depression Father    Coronary artery disease Maternal Aunt 13       (Mother's twin sister)   Lung cancer Maternal Aunt    Heart attack Maternal Aunt        mother's twin   Colon polyps Maternal Aunt    Colon cancer Maternal Uncle    Coronary artery disease Maternal Uncle 46   Heart attack Maternal Uncle    Breast cancer Maternal Grandmother    Breast cancer Cousin 38   Cancer Neg Hx    Early death Neg Hx    Kidney disease Neg Hx    Stroke Neg Hx    Alcohol abuse Neg Hx    Drug abuse Neg Hx      Allergies  Allergen Reactions   Wound Dressing Adhesive Dermatitis  and Rash      Patient's last menstrual period was No LMP recorded. (Menstrual status: IUD)..            Review of Systems Alls systems reviewed and are negative.     OBGyn Exam    A:         Well Woman GYN exam             IUD removal Vaginal discharge risk for yeast infection on trulicity                 P:        Pap smear collected today Encouraged annual mammogram screening Colon cancer screening up-to-date DXA up-to-date Labs and immunizations to do with PMD. To get FSH, estradiol  with vasomotor symptoms Discussed breast self exams Encouraged healthy lifestyle practices Encouraged Vit D and Calcium    No follow-ups on file.  Almarie MARLA Carpen

## 2023-12-01 ENCOUNTER — Ambulatory Visit: Payer: Self-pay | Admitting: Obstetrics and Gynecology

## 2023-12-01 LAB — SURESWAB® ADVANCED VAGINITIS PLUS,TMA
C. trachomatis RNA, TMA: NOT DETECTED
CANDIDA SPECIES: NOT DETECTED
Candida glabrata: NOT DETECTED
N. gonorrhoeae RNA, TMA: NOT DETECTED
SURESWAB(R) ADV BACTERIAL VAGINOSIS(BV),TMA: POSITIVE — AB
TRICHOMONAS VAGINALIS (TV),TMA: NOT DETECTED

## 2023-12-01 LAB — VITAMIN D 25 HYDROXY (VIT D DEFICIENCY, FRACTURES): Vit D, 25-Hydroxy: 64 ng/mL (ref 30–100)

## 2023-12-01 LAB — ESTRADIOL: Estradiol: 34 pg/mL

## 2023-12-01 LAB — FOLLICLE STIMULATING HORMONE: FSH: 18.6 m[IU]/mL

## 2023-12-02 MED ORDER — METRONIDAZOLE 500 MG PO TABS: 500.0000 mg | ORAL_TABLET | Freq: Two times a day (BID) | ORAL | 0 refills | Status: DC

## 2023-12-03 LAB — CYTOLOGY - PAP
Diagnosis: NEGATIVE
Diagnosis: REACTIVE

## 2023-12-06 MED ORDER — REPATHA SURECLICK 140 MG/ML ~~LOC~~ SOAJ: 1.0000 mL | SUBCUTANEOUS | 1 refills | Status: AC

## 2023-12-06 MED ORDER — TRULICITY 1.5 MG/0.5ML ~~LOC~~ SOAJ: 1.5000 mg | SUBCUTANEOUS | 0 refills | Status: DC

## 2023-12-06 NOTE — Addendum Note (Signed)
 Addended by: DARRELL BRUCKNER on: 12/06/2023 12:32 PM   Modules accepted: Orders

## 2023-12-07 ENCOUNTER — Ambulatory Visit (INDEPENDENT_AMBULATORY_CARE_PROVIDER_SITE_OTHER): Admitting: Psychology

## 2023-12-07 DIAGNOSIS — F411 Generalized anxiety disorder: Secondary | ICD-10-CM

## 2023-12-07 DIAGNOSIS — F418 Other specified anxiety disorders: Secondary | ICD-10-CM

## 2023-12-07 DIAGNOSIS — F331 Major depressive disorder, recurrent, moderate: Secondary | ICD-10-CM | POA: Diagnosis not present
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
## 2023-12-14 ENCOUNTER — Other Ambulatory Visit: Payer: Self-pay | Admitting: Internal Medicine

## 2023-12-14 ENCOUNTER — Ambulatory Visit: Admitting: Psychology

## 2023-12-14 DIAGNOSIS — F418 Other specified anxiety disorders: Secondary | ICD-10-CM

## 2023-12-14 MED ORDER — TRULICITY 3 MG/0.5ML ~~LOC~~ SOAJ: 3.0000 mg | SUBCUTANEOUS | 0 refills | Status: DC

## 2023-12-14 NOTE — Addendum Note (Signed)
 Addended by: DARRELL BRUCKNER on: 12/14/2023 11:40 AM   Modules accepted: Orders

## 2023-12-20 ENCOUNTER — Encounter: Payer: Self-pay | Admitting: Pharmacist

## 2023-12-20 ENCOUNTER — Encounter: Payer: Self-pay | Admitting: Cardiology

## 2023-12-20 MED ORDER — REPATHA SURECLICK 140 MG/ML ~~LOC~~ SOAJ: 1.0000 mL | SUBCUTANEOUS | 0 refills | Status: DC

## 2023-12-21 ENCOUNTER — Ambulatory Visit (INDEPENDENT_AMBULATORY_CARE_PROVIDER_SITE_OTHER): Admitting: Psychology

## 2023-12-21 DIAGNOSIS — F411 Generalized anxiety disorder: Secondary | ICD-10-CM

## 2023-12-21 DIAGNOSIS — F331 Major depressive disorder, recurrent, moderate: Secondary | ICD-10-CM | POA: Diagnosis not present
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
## 2023-12-24 ENCOUNTER — Other Ambulatory Visit

## 2023-12-24 DIAGNOSIS — E119 Type 2 diabetes mellitus without complications: Secondary | ICD-10-CM

## 2023-12-24 DIAGNOSIS — G4733 Obstructive sleep apnea (adult) (pediatric): Secondary | ICD-10-CM | POA: Diagnosis not present

## 2023-12-24 NOTE — Addendum Note (Signed)
 Addended by: Justis Closser P on: 12/24/2023 09:09 AM   Modules accepted: Orders

## 2023-12-25 LAB — MICROALBUMIN / CREATININE URINE RATIO
Creatinine, Urine: 63 mg/dL (ref 20–275)
Microalb Creat Ratio: 3 mg/g{creat} (ref <30)
Microalb, Ur: 0.2 mg/dL

## 2023-12-25 LAB — HEMOGLOBIN A1C
Hgb A1c MFr Bld: 6 % — ABNORMAL HIGH (ref <5.7)
Mean Plasma Glucose: 126 mg/dL
eAG (mmol/L): 7 mmol/L

## 2023-12-26 MED ORDER — REPATHA SURECLICK 140 MG/ML ~~LOC~~ SOAJ: 1.0000 mL | SUBCUTANEOUS | 0 refills | Status: DC

## 2023-12-26 NOTE — Addendum Note (Signed)
 Addended by: Salim Forero L on: 12/26/2023 03:26 PM   Modules accepted: Orders

## 2023-12-27 NOTE — Progress Notes (Signed)
 Patient Care Team: Perri Ronal PARAS, MD as PCP - General (Internal Medicine) Lavona Agent, MD as PCP - Cardiology (Cardiology) Shyrl Linnie KIDD, MD as Consulting Physician (Cardiothoracic Surgery)  Visit Date: 12/28/23  Subjective:    Patient ID: Rhonda Colon Presume , Female   DOB: 07-11-1975, 48 y.o.    MRN: [id number]   48 y.o. Female presents today for 3 month follow up for Hypertension, Diabetes Mellitus type II, Hyperlipidemia. Patient has a past medical history of Obesity, Anxiety/Depression, DE reflux, Daytime Somnolence.  Says she is doing well.   History of Diabetes Mellitus, Type II. 12/24/2023 HgbA1c 6.0% decreased from 09/17/23 HgbA1c 6.9%   History of Hyperlipidemia treated with Rosuvastatin  40 mg daily, Zetia  10 mg daily and Repatha  140 mg injections fortnightly. Says that she has not taken Repatha  in a week but she has reordered it and it should be coming soon.  09/17/2023 Lipoprotein A was 222.8, normal is less than or equal to 75. Will consult with cardiologist about discontinuing Zetia .   Former Smoker, quit following an MI in 2013; Hypertension treated with Lisinopril  10 mg daily and Nitroglycerin  as needed for chest pain. Blood pressure normal today at 126/82. Followed by Dr. Lavona, Cardiologist.    History of Obesity; BMI 50+, Weight loss managed with Trulicity  3 mg injections weekly. Current weight is 342 BMI 58.25. down from 357 lbs BMI 60.47 in June.    History of Anxiety/Depression treated with Xanax  1 mg as needed, Wellbutrin -SR 150 mg twice daily, and Zoloft  50 mg daily. Now followed by Dr. Jackson.    History of Daytime Somnolence diagnosed with Severe OSA per sleep study on 08/20/2023 and is now managed with a CPAP nightly.    Vaccine counseling: Tetanus vaccine received today, Hepatitis B vaccine discontinued. Shingles vaccine due, Influenza vaccine declined.    Labs 12/23/2024 HgbA1c 6.0% otherwise WNL    Past Medical History:  Diagnosis Date    Anxiety    Back pain    CAD (coronary artery disease) 07/2011   cardiologist--- dr lavona--   a. 07/2011 NSTEMI/Cath: RCA 99%m, otw nl cors, EF 50%, inf hk -> RCA stented with 3.0x89mm Promus DES   Depression    Fatty liver    GERD (gastroesophageal reflux disease)    Heart disease    History of non-ST elevation myocardial infarction (NSTEMI) 07/2011   s/p PCI and stenting   History of sepsis 04/2021   hospital admission---  sepsis due to infected mesh post op diaphramatic hernia repair,  intrathoracic abscess w/ pleural effusion   Hyperlipidemia    Hypertension    Morgagni hernia    followed by dr h. littlefoot (cardiac / thoracic surgeon)  lov note in epic 10-17-2021 pt released prn//    a. Discovered incidentally on CT 07/2011  (diaphragmatic hernia);    02-24-2021  s/p repair with mesh;  hernia recurrence 04-04-2021/  02/ 2023 infected mesh w/ sepsis,  s/p unroof abscess/ pleural irrigation system placed/   re-do hernia repair 06-02-2021   Myocardial infarction [facility])    2013   Obesity    Obesity    S/P drug eluting coronary stent placement 08/10/2011   DES x1  to  mRCA   Sinus bradycardia    a. Nocturnal, asymptomatic   Sun allergy    per pt dx via testing     Family History  Problem Relation Age of Onset   Diabetes Mother    Coronary artery disease Mother 68  stent   Heart attack Mother    Heart disease Mother    Hyperlipidemia Mother    Obesity Mother    Colon polyps Mother    Hypertension Father    Arrhythmia Father    Depression Father    Coronary artery disease Maternal Aunt 49       (Mother's twin sister)   Lung cancer Maternal Aunt    Heart attack Maternal Aunt        mother's twin   Colon polyps Maternal Aunt    Colon cancer Maternal Uncle    Coronary artery disease Maternal Uncle 46   Heart attack Maternal Uncle    Breast cancer Maternal Grandmother    Breast cancer Cousin 77   Cancer Neg Hx    Early death Neg Hx    Kidney disease Neg Hx     Stroke Neg Hx    Alcohol abuse Neg Hx    Drug abuse Neg Hx     Social History   Social History Narrative   Lives alone.  Occasional EtOH.  Works as a Veterinary surgeon.      Review of Systems  All other systems reviewed and are negative.       Objective:   Vitals: BP 110/80   Ht 5' 4.25 (1.632 m)   Wt (!) 342 lb (155.1 kg)   BMI 58.25 kg/m    Physical Exam Vitals and nursing note reviewed.  Constitutional:      General: She is not in acute distress.    Appearance: Normal appearance. She is not toxic-appearing.  HENT:     Head: Normocephalic and atraumatic.  Neck:     Thyroid : No thyroid  mass, thyromegaly or thyroid  tenderness.     Vascular: No carotid bruit.  Cardiovascular:     Rate and Rhythm: Normal rate and regular rhythm. No extrasystoles are present.    Pulses: Normal pulses.     Heart sounds: Normal heart sounds. No murmur heard.    No friction rub. No gallop.  Pulmonary:     Effort: Pulmonary effort is normal. No respiratory distress.     Breath sounds: Normal breath sounds. No wheezing or rales.  Musculoskeletal:     Right lower leg: Edema present.     Left lower leg: Edema present.     Comments: Trace Lower extremity edema.   Skin:    General: Skin is warm and dry.  Neurological:     Mental Status: She is alert and oriented to person, place, and time. Mental status is at baseline.  Psychiatric:        Mood and Affect: Mood normal.        Behavior: Behavior normal.        Thought Content: Thought content normal.        Judgment: Judgment normal.       Results:      Labs:       Component Value Date/Time   NA 138 09/17/2023 0914   NA 141 01/18/2023 1045   K 4.7 09/17/2023 0914   CL 102 09/17/2023 0914   CO2 24 09/17/2023 0914   GLUCOSE 126 (H) 09/17/2023 0914   BUN 10 09/17/2023 0914   BUN 9 01/18/2023 1045   CREATININE 0.88 09/17/2023 0914   CALCIUM  9.4 09/17/2023 0914   PROT 7.0 09/17/2023 0914   PROT 6.9 01/18/2023 1045   ALBUMIN   4.2 01/18/2023 1045   AST 44 (H) 09/17/2023 0914   ALT 38 (H) 09/17/2023 0914  ALKPHOS 96 01/18/2023 1045   BILITOT 0.5 09/17/2023 0914   BILITOT 0.5 01/18/2023 1045   GFRNONAA >60 06/04/2021 0502   GFRNONAA 97 07/30/2020 0944   GFRAA 113 07/30/2020 0944     Lab Results  Component Value Date   WBC 7.9 09/17/2023   HGB 16.0 (H) 09/17/2023   HCT 48.7 (H) 09/17/2023   MCV 92.4 09/17/2023   PLT 288 09/17/2023    Lab Results  Component Value Date   CHOL 146 09/17/2023   HDL 48 (L) 09/17/2023   LDLCALC 83 09/17/2023   TRIG 74 09/17/2023   CHOLHDL 3.0 09/17/2023    Lab Results  Component Value Date   HGBA1C 6.0 (H) 12/24/2023     Lab Results  Component Value Date   TSH 1.27 09/17/2023         Assessment & Plan:   Diabetes Mellitus, Type II: 12/24/2023 HgbA1c 6.0% decreased from 09/17/23 HgbA1c 6.9%   Hyperlipidemia: treated with Rosuvastatin  40 mg daily, Zetia  10 mg daily  and Repatha  140 mg injections fortnightly. Says that she has not taken Repatha  in a week but she has reordered it and it should be coming soon.  09/17/2023 Lipoprotein A was 222.8, normal is less than or equal to 75. Will consult with cardiologist about discontinuing Zetia .   Hypertension: Former Smoker, quit following an MI in 2013; Hypertension treated with Lisinopril  10 mg daily and Nitroglycerin  0.4 mg as needed for chest pain. Blood pressure normal today at 126/82. Followed by Dr. Lavona, Cardiologist.    Obesity; BMI 50+: Weight loss managed with Trulicity  3 mg injections weekly. Current weight is 342 BMI 58.25. down from 357 lbs BMI 60.47 in June.    Anxiety/Depression: treated with Xanax  1 mg as needed, Wellbutrin -SR 150 mg twice daily, and Zoloft  50 mg daily. Now followed by Dr. Jackson, psychologist  Zoloft  100 mg refilled.    Daytime Somnolence:  diagnosed with Severe OSA per sleep study on 08/20/2023 and is now managed with  CPAP nightly.    Vaccine counseling: Tetanus vaccine received  today, Hepatitis B vaccine discontinued. Shingles vaccine due, Influenza vaccine declined.    Hx of CAD: followed by Dr. Lavona  Low HDL- encourage exercise  Mixed hyperlipidemia being treated at cardiology aggressively. Counseled patient on making lifestyle changes  Vitamin D  deficiency- level was low at 26 in October 2024.  Plan: Needs annual wellness visit and health maintenance exam after June 24. 2026 here with fasting labs.    I,Makayla C Reid,acting as a scribe for Ronal JINNY Hailstone, MD.,have documented all relevant documentation on the behalf of Ronal JINNY Hailstone, MD,as directed by  Ronal JINNY Hailstone, MD while in the presence of Ronal JINNY Hailstone, MD.

## 2023-12-28 ENCOUNTER — Ambulatory Visit (INDEPENDENT_AMBULATORY_CARE_PROVIDER_SITE_OTHER): Admitting: Psychology

## 2023-12-28 ENCOUNTER — Ambulatory Visit: Admitting: Internal Medicine

## 2023-12-28 ENCOUNTER — Encounter: Payer: Self-pay | Admitting: Internal Medicine

## 2023-12-28 VITALS — BP 110/80 | Ht 64.25 in | Wt 342.0 lb

## 2023-12-28 DIAGNOSIS — I252 Old myocardial infarction: Secondary | ICD-10-CM | POA: Diagnosis not present

## 2023-12-28 DIAGNOSIS — E786 Lipoprotein deficiency: Secondary | ICD-10-CM

## 2023-12-28 DIAGNOSIS — F331 Major depressive disorder, recurrent, moderate: Secondary | ICD-10-CM

## 2023-12-28 DIAGNOSIS — F411 Generalized anxiety disorder: Secondary | ICD-10-CM

## 2023-12-28 DIAGNOSIS — E119 Type 2 diabetes mellitus without complications: Secondary | ICD-10-CM | POA: Diagnosis not present

## 2023-12-28 DIAGNOSIS — Z23 Encounter for immunization: Secondary | ICD-10-CM

## 2023-12-28 DIAGNOSIS — I10 Essential (primary) hypertension: Secondary | ICD-10-CM | POA: Diagnosis not present

## 2023-12-28 DIAGNOSIS — Z6841 Body Mass Index (BMI) 40.0 and over, adult: Secondary | ICD-10-CM | POA: Diagnosis not present

## 2023-12-28 DIAGNOSIS — E559 Vitamin D deficiency, unspecified: Secondary | ICD-10-CM

## 2023-12-28 DIAGNOSIS — K219 Gastro-esophageal reflux disease without esophagitis: Secondary | ICD-10-CM

## 2023-12-28 DIAGNOSIS — E782 Mixed hyperlipidemia: Secondary | ICD-10-CM | POA: Diagnosis not present

## 2023-12-28 DIAGNOSIS — R600 Localized edema: Secondary | ICD-10-CM | POA: Diagnosis not present

## 2023-12-28 DIAGNOSIS — I2510 Atherosclerotic heart disease of native coronary artery without angina pectoris: Secondary | ICD-10-CM

## 2023-12-28 DIAGNOSIS — F5101 Primary insomnia: Secondary | ICD-10-CM

## 2023-12-28 DIAGNOSIS — F418 Other specified anxiety disorders: Secondary | ICD-10-CM | POA: Diagnosis not present

## 2023-12-28 DIAGNOSIS — G4733 Obstructive sleep apnea (adult) (pediatric): Secondary | ICD-10-CM

## 2023-12-28 MED ORDER — SERTRALINE HCL 100 MG PO TABS: 100.0000 mg | ORAL_TABLET | Freq: Every day | ORAL | 3 refills | Status: AC
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
## 2024-01-04 ENCOUNTER — Ambulatory Visit: Admitting: Psychology

## 2024-01-04 DIAGNOSIS — H93291 Other abnormal auditory perceptions, right ear: Secondary | ICD-10-CM | POA: Diagnosis not present

## 2024-01-04 DIAGNOSIS — H938X1 Other specified disorders of right ear: Secondary | ICD-10-CM | POA: Diagnosis not present

## 2024-01-08 ENCOUNTER — Other Ambulatory Visit: Payer: Self-pay | Admitting: Internal Medicine

## 2024-01-08 DIAGNOSIS — F418 Other specified anxiety disorders: Secondary | ICD-10-CM

## 2024-01-10 MED ORDER — TRULICITY 4.5 MG/0.5ML ~~LOC~~ SOAJ: 4.5000 mg | SUBCUTANEOUS | 5 refills | Status: DC

## 2024-01-10 NOTE — Addendum Note (Signed)
 Addended by: DARRELL BRUCKNER on: 01/10/2024 09:47 AM   Modules accepted: Orders

## 2024-01-11 ENCOUNTER — Ambulatory Visit (INDEPENDENT_AMBULATORY_CARE_PROVIDER_SITE_OTHER): Admitting: Psychology

## 2024-01-11 DIAGNOSIS — F411 Generalized anxiety disorder: Secondary | ICD-10-CM | POA: Diagnosis not present

## 2024-01-11 DIAGNOSIS — F331 Major depressive disorder, recurrent, moderate: Secondary | ICD-10-CM
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
## 2024-01-18 ENCOUNTER — Ambulatory Visit (INDEPENDENT_AMBULATORY_CARE_PROVIDER_SITE_OTHER): Admitting: Psychology

## 2024-01-18 DIAGNOSIS — F331 Major depressive disorder, recurrent, moderate: Secondary | ICD-10-CM

## 2024-01-18 DIAGNOSIS — F411 Generalized anxiety disorder: Secondary | ICD-10-CM
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
## 2024-01-23 DIAGNOSIS — G4733 Obstructive sleep apnea (adult) (pediatric): Secondary | ICD-10-CM | POA: Diagnosis not present

## 2024-01-25 ENCOUNTER — Telehealth: Admitting: Adult Health

## 2024-01-25 ENCOUNTER — Ambulatory Visit: Admitting: Psychology

## 2024-01-25 DIAGNOSIS — F331 Major depressive disorder, recurrent, moderate: Secondary | ICD-10-CM

## 2024-01-25 DIAGNOSIS — F411 Generalized anxiety disorder: Secondary | ICD-10-CM | POA: Diagnosis not present
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
## 2024-01-27 DIAGNOSIS — Z1231 Encounter for screening mammogram for malignant neoplasm of breast: Secondary | ICD-10-CM | POA: Diagnosis not present

## 2024-01-27 LAB — HM MAMMOGRAPHY

## 2024-01-28 ENCOUNTER — Encounter: Payer: Self-pay | Admitting: Internal Medicine

## 2024-02-01 ENCOUNTER — Ambulatory Visit: Admitting: Psychology

## 2024-02-01 DIAGNOSIS — F331 Major depressive disorder, recurrent, moderate: Secondary | ICD-10-CM

## 2024-02-01 DIAGNOSIS — F411 Generalized anxiety disorder: Secondary | ICD-10-CM | POA: Diagnosis not present
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
## 2024-02-02 DIAGNOSIS — R928 Other abnormal and inconclusive findings on diagnostic imaging of breast: Secondary | ICD-10-CM | POA: Diagnosis not present

## 2024-02-02 DIAGNOSIS — N6323 Unspecified lump in the left breast, lower outer quadrant: Secondary | ICD-10-CM | POA: Diagnosis not present

## 2024-02-02 LAB — MM OUTSIDE FILMS MAMMO

## 2024-02-10 LAB — LIPID PANEL

## 2024-02-11 ENCOUNTER — Ambulatory Visit: Payer: Self-pay | Admitting: Cardiology

## 2024-02-11 ENCOUNTER — Encounter: Payer: Self-pay | Admitting: Cardiology

## 2024-02-11 ENCOUNTER — Encounter: Payer: Self-pay | Admitting: Pharmacist

## 2024-02-11 DIAGNOSIS — E785 Hyperlipidemia, unspecified: Secondary | ICD-10-CM

## 2024-02-11 LAB — LIPID PANEL
Cholesterol, Total: 125 mg/dL (ref 100–199)
HDL: 56 mg/dL (ref >39)
LDL CALC COMMENT:: 2.2 ratio (ref 0.0–4.4)
LDL Chol Calc (NIH): 53 mg/dL (ref 0–99)
Triglycerides: 83 mg/dL (ref 0–149)
VLDL Cholesterol Cal: 16 mg/dL (ref 5–40)

## 2024-02-15 ENCOUNTER — Encounter: Payer: Self-pay | Admitting: Internal Medicine

## 2024-02-22 ENCOUNTER — Ambulatory Visit (INDEPENDENT_AMBULATORY_CARE_PROVIDER_SITE_OTHER): Admitting: Psychology

## 2024-02-22 DIAGNOSIS — F411 Generalized anxiety disorder: Secondary | ICD-10-CM | POA: Diagnosis not present

## 2024-02-22 DIAGNOSIS — F331 Major depressive disorder, recurrent, moderate: Secondary | ICD-10-CM
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
## 2024-02-29 ENCOUNTER — Ambulatory Visit: Admitting: Psychology

## 2024-03-07 ENCOUNTER — Other Ambulatory Visit: Payer: Self-pay

## 2024-03-07 ENCOUNTER — Ambulatory Visit: Admitting: Psychology

## 2024-03-07 DIAGNOSIS — F411 Generalized anxiety disorder: Secondary | ICD-10-CM

## 2024-03-07 NOTE — Telephone Encounter (Signed)
 Received PA FORM for OMEPRAZOLE  20MG .
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
## 2024-03-09 ENCOUNTER — Other Ambulatory Visit (HOSPITAL_COMMUNITY): Payer: Self-pay

## 2024-03-09 ENCOUNTER — Telehealth: Payer: Self-pay

## 2024-03-09 NOTE — Telephone Encounter (Signed)
 Pharmacy Patient Advocate Encounter   Received notification from Pt Calls Messages that prior authorization for omeprazole  (PRILOSEC) 20 MG capsule is required/requested.   Insurance verification completed.   The patient is insured through [facility].   Per test claim: Per test claim, medication THE PLAN LIMITED EXCEED MAX 90 PER 365 DAYS /benefit EXCEEDED, PA WILL BE  submitted.  SEE BELOW  PLEASE BE ADVISED

## 2024-03-09 NOTE — Telephone Encounter (Signed)
 Pharmacy Patient Advocate Encounter   Received notification from RX Request Messages that prior authorization for omeprazole  (PRILOSEC) 20 MG capsule is required/requested.   Insurance verification completed.   The patient is insured through [facility].   Per test claim: PA required; PA submitted to above mentioned insurance via Latent Key/confirmation #/EOC [id number] Status is pending

## 2024-03-10 ENCOUNTER — Other Ambulatory Visit (HOSPITAL_COMMUNITY): Payer: Self-pay

## 2024-03-10 MED ORDER — OMEPRAZOLE 20 MG PO CPDR: 20.0000 mg | DELAYED_RELEASE_CAPSULE | Freq: Every morning | ORAL | 6 refills | Status: AC

## 2024-03-10 NOTE — Telephone Encounter (Signed)
 Pharmacy Patient Advocate Encounter  Received notification from [facility] that Prior Authorization for Omeprazole  20MG  dr capsules  has been APPROVED from 03/09/2024 to 03/09/2025 with  APPROVAL QUANTITY LIMIT of 30 for a 30 day supply PT HAS BEEN APPROVED FOR PLAN LIMITED EXCEED QTY.   PA #/Case ID/Reference #: [id number]  PLEASE BE ADVISED SPOKE TO PLAN.

## 2024-03-14 ENCOUNTER — Ambulatory Visit: Admitting: Psychology

## 2024-03-14 NOTE — Telephone Encounter (Signed)
 done

## 2024-03-24 DIAGNOSIS — G4733 Obstructive sleep apnea (adult) (pediatric): Secondary | ICD-10-CM | POA: Diagnosis not present

## 2024-03-27 ENCOUNTER — Encounter: Payer: Self-pay | Admitting: Pharmacist

## 2024-03-28 ENCOUNTER — Ambulatory Visit: Admitting: Psychology

## 2024-03-28 DIAGNOSIS — F411 Generalized anxiety disorder: Secondary | ICD-10-CM | POA: Diagnosis not present

## 2024-03-28 DIAGNOSIS — F331 Major depressive disorder, recurrent, moderate: Secondary | ICD-10-CM | POA: Diagnosis not present
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
## 2024-03-31 ENCOUNTER — Other Ambulatory Visit (HOSPITAL_COMMUNITY): Payer: Self-pay

## 2024-03-31 ENCOUNTER — Telehealth: Payer: Self-pay | Admitting: Pharmacy Technician

## 2024-03-31 DIAGNOSIS — E119 Type 2 diabetes mellitus without complications: Secondary | ICD-10-CM

## 2024-03-31 NOTE — Telephone Encounter (Addendum)
 Per plan:  patient has not tried metformin

## 2024-04-04 ENCOUNTER — Ambulatory Visit: Admitting: Psychology

## 2024-04-04 DIAGNOSIS — F411 Generalized anxiety disorder: Secondary | ICD-10-CM | POA: Diagnosis not present

## 2024-04-04 DIAGNOSIS — F331 Major depressive disorder, recurrent, moderate: Secondary | ICD-10-CM | POA: Diagnosis not present
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
## 2024-04-10 MED ORDER — MOUNJARO 2.5 MG/0.5ML ~~LOC~~ SOAJ: 2.5000 mg | SUBCUTANEOUS | 0 refills | Status: DC

## 2024-04-11 ENCOUNTER — Ambulatory Visit: Admitting: Psychology

## 2024-04-13 ENCOUNTER — Other Ambulatory Visit (HOSPITAL_COMMUNITY): Payer: Self-pay

## 2024-04-18 ENCOUNTER — Ambulatory Visit: Admitting: Psychology

## 2024-04-18 DIAGNOSIS — F411 Generalized anxiety disorder: Secondary | ICD-10-CM
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
## 2024-04-19 ENCOUNTER — Other Ambulatory Visit (HOSPITAL_COMMUNITY): Payer: Self-pay

## 2024-04-19 ENCOUNTER — Telehealth: Payer: Self-pay | Admitting: Pharmacy Technician

## 2024-04-19 DIAGNOSIS — E119 Type 2 diabetes mellitus without complications: Secondary | ICD-10-CM

## 2024-04-19 NOTE — Telephone Encounter (Signed)
" ° °  Pharmacy Patient Advocate Encounter   Received notification from Patient Advice Request messages that prior authorization for repatha  is required/requested.   Insurance verification completed.   The patient is insured through [employer].   Per test claim: PA required; PA submitted to above mentioned insurance via Latent Key/confirmation #/EOC [id number] Status is pending  "

## 2024-04-19 NOTE — Telephone Encounter (Signed)
" ° °  Pharmacy Patient Advocate Encounter   Received notification from Patient Advice Request messages that prior authorization for OZMEPIC is required/requested.   Insurance verification completed.   The patient is insured through [employer].   Per test claim: PA required; PA submitted to above mentioned insurance via Latent Key/confirmation #/EOC [facility] Status is pending  "

## 2024-04-25 ENCOUNTER — Other Ambulatory Visit (HOSPITAL_COMMUNITY): Payer: Self-pay

## 2024-04-25 MED ORDER — OZEMPIC (0.25 OR 0.5 MG/DOSE) 2 MG/3ML ~~LOC~~ SOPN: 0.5000 mg | PEN_INJECTOR | SUBCUTANEOUS | 0 refills | Status: AC

## 2024-04-25 NOTE — Telephone Encounter (Signed)
 Pharmacy Patient Advocate Encounter  Received notification from [facility] that Prior Authorization for ozempic  has been APPROVED from 04/24/24 to 04/24/25. Ran test claim, Copay is $15.00. This test claim was processed through [facility]- copay amounts may vary at other pharmacies due to pharmacy/plan contracts, or as the patient moves through the different stages of their insurance plan.   PA #/Case ID/Reference #: [id number]

## 2024-04-25 NOTE — Addendum Note (Signed)
 Addended by: DARRELL BAREFOOT on: 04/25/2024 11:44 AM   Modules accepted: Orders

## 2024-05-02 ENCOUNTER — Ambulatory Visit: Admitting: Psychology

## 2024-05-02 DIAGNOSIS — F411 Generalized anxiety disorder: Secondary | ICD-10-CM

## 2024-05-02 DIAGNOSIS — F331 Major depressive disorder, recurrent, moderate: Secondary | ICD-10-CM

## 2024-05-02 DIAGNOSIS — F330 Major depressive disorder, recurrent, mild: Secondary | ICD-10-CM

## 2024-05-03 ENCOUNTER — Ambulatory Visit: Admitting: Psychology

## 2024-05-03 DIAGNOSIS — F331 Major depressive disorder, recurrent, moderate: Secondary | ICD-10-CM

## 2024-05-03 DIAGNOSIS — F411 Generalized anxiety disorder: Secondary | ICD-10-CM
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
## 2024-05-04 ENCOUNTER — Encounter: Payer: Self-pay | Admitting: Internal Medicine

## 2024-05-09 ENCOUNTER — Ambulatory Visit: Admitting: Psychology

## 2024-05-16 ENCOUNTER — Ambulatory Visit: Admitting: Psychology

## 2024-05-23 ENCOUNTER — Ambulatory Visit: Admitting: Psychology

## 2024-05-30 ENCOUNTER — Ambulatory Visit: Admitting: Psychology

## 2024-06-06 ENCOUNTER — Ambulatory Visit: Admitting: Psychology

## 2024-06-20 ENCOUNTER — Ambulatory Visit: Admitting: Psychology

## 2024-06-27 ENCOUNTER — Ambulatory Visit: Admitting: Psychology

## 2024-07-04 ENCOUNTER — Ambulatory Visit: Admitting: Psychology

## 2024-07-18 ENCOUNTER — Ambulatory Visit: Admitting: Psychology

## 2024-07-25 ENCOUNTER — Ambulatory Visit: Admitting: Psychology
